# Patient Record
Sex: Female | Born: 1985 | Race: Black or African American | Hispanic: No | Marital: Single | Smoking: Current every day smoker
Health system: Southern US, Community
[De-identification: ages and names within clinical notes are randomized; demographics above are authoritative.]

## PROBLEM LIST (undated history)

## (undated) ENCOUNTER — Inpatient Hospital Stay (HOSPITAL_COMMUNITY): Payer: Self-pay

## (undated) ENCOUNTER — Emergency Department (HOSPITAL_COMMUNITY): Admission: EM | Payer: Medicaid Other | Source: Home / Self Care

## (undated) DIAGNOSIS — S0101XA Laceration without foreign body of scalp, initial encounter: Secondary | ICD-10-CM

## (undated) DIAGNOSIS — G4730 Sleep apnea, unspecified: Secondary | ICD-10-CM

## (undated) DIAGNOSIS — S52209A Unspecified fracture of shaft of unspecified ulna, initial encounter for closed fracture: Secondary | ICD-10-CM

## (undated) DIAGNOSIS — K589 Irritable bowel syndrome without diarrhea: Secondary | ICD-10-CM

## (undated) DIAGNOSIS — F319 Bipolar disorder, unspecified: Secondary | ICD-10-CM

## (undated) DIAGNOSIS — F419 Anxiety disorder, unspecified: Secondary | ICD-10-CM

## (undated) DIAGNOSIS — N739 Female pelvic inflammatory disease, unspecified: Secondary | ICD-10-CM

## (undated) DIAGNOSIS — F32A Depression, unspecified: Secondary | ICD-10-CM

## (undated) DIAGNOSIS — F329 Major depressive disorder, single episode, unspecified: Secondary | ICD-10-CM

## (undated) DIAGNOSIS — Z8614 Personal history of Methicillin resistant Staphylococcus aureus infection: Secondary | ICD-10-CM

## (undated) DIAGNOSIS — N760 Acute vaginitis: Secondary | ICD-10-CM

## (undated) DIAGNOSIS — B9689 Other specified bacterial agents as the cause of diseases classified elsewhere: Secondary | ICD-10-CM

## (undated) HISTORY — PX: TUBAL LIGATION: SHX77

## (undated) HISTORY — PX: CYST EXCISION: SHX5701

## (undated) HISTORY — PX: OVARIAN CYST REMOVAL: SHX89

## (undated) HISTORY — PX: THYROID CYST EXCISION: SHX2511

---
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
## 2003-01-14 ENCOUNTER — Inpatient Hospital Stay (HOSPITAL_COMMUNITY): Admission: AD | Admit: 2003-01-14 | Discharge: 2003-01-14 | Payer: Self-pay | Admitting: Family Medicine

## 2003-01-18 ENCOUNTER — Inpatient Hospital Stay (HOSPITAL_COMMUNITY): Admission: AD | Admit: 2003-01-18 | Discharge: 2003-01-18 | Payer: Self-pay | Admitting: *Deleted

## 2003-02-02 ENCOUNTER — Emergency Department (HOSPITAL_COMMUNITY): Admission: EM | Admit: 2003-02-02 | Discharge: 2003-02-03 | Payer: Self-pay | Admitting: Emergency Medicine

## 2003-04-06 ENCOUNTER — Emergency Department (HOSPITAL_COMMUNITY): Admission: EM | Admit: 2003-04-06 | Discharge: 2003-04-07 | Payer: Self-pay | Admitting: Emergency Medicine

## 2003-06-24 ENCOUNTER — Emergency Department (HOSPITAL_COMMUNITY): Admission: EM | Admit: 2003-06-24 | Discharge: 2003-06-24 | Payer: Self-pay | Admitting: Emergency Medicine

## 2003-10-04 ENCOUNTER — Emergency Department (HOSPITAL_COMMUNITY): Admission: EM | Admit: 2003-10-04 | Discharge: 2003-10-04 | Payer: Self-pay | Admitting: Emergency Medicine

## 2003-10-07 ENCOUNTER — Emergency Department (HOSPITAL_COMMUNITY): Admission: AD | Admit: 2003-10-07 | Discharge: 2003-10-07 | Payer: Self-pay | Admitting: Family Medicine

## 2003-10-18 ENCOUNTER — Emergency Department (HOSPITAL_COMMUNITY): Admission: EM | Admit: 2003-10-18 | Discharge: 2003-10-18 | Payer: Self-pay | Admitting: Emergency Medicine

## 2004-04-17 ENCOUNTER — Emergency Department (HOSPITAL_COMMUNITY): Admission: EM | Admit: 2004-04-17 | Discharge: 2004-04-17 | Payer: Self-pay | Admitting: *Deleted

## 2004-04-18 ENCOUNTER — Emergency Department (HOSPITAL_COMMUNITY): Admission: EM | Admit: 2004-04-18 | Discharge: 2004-04-18 | Payer: Self-pay | Admitting: Family Medicine

## 2004-05-08 ENCOUNTER — Emergency Department (HOSPITAL_COMMUNITY): Admission: EM | Admit: 2004-05-08 | Discharge: 2004-05-08 | Payer: Self-pay | Admitting: *Deleted

## 2004-05-10 ENCOUNTER — Emergency Department (HOSPITAL_COMMUNITY): Admission: EM | Admit: 2004-05-10 | Discharge: 2004-05-10 | Payer: Self-pay | Admitting: Family Medicine

## 2004-08-06 ENCOUNTER — Emergency Department (HOSPITAL_COMMUNITY): Admission: EM | Admit: 2004-08-06 | Discharge: 2004-08-06 | Payer: Self-pay | Admitting: Emergency Medicine

## 2004-08-07 ENCOUNTER — Inpatient Hospital Stay (HOSPITAL_COMMUNITY): Admission: AD | Admit: 2004-08-07 | Discharge: 2004-08-07 | Payer: Self-pay | Admitting: Family Medicine

## 2004-09-25 ENCOUNTER — Inpatient Hospital Stay (HOSPITAL_COMMUNITY): Admission: AD | Admit: 2004-09-25 | Discharge: 2004-09-25 | Payer: Self-pay | Admitting: *Deleted

## 2004-09-26 ENCOUNTER — Ambulatory Visit (HOSPITAL_COMMUNITY): Admission: RE | Admit: 2004-09-26 | Discharge: 2004-09-26 | Payer: Self-pay | Admitting: *Deleted

## 2004-10-27 ENCOUNTER — Ambulatory Visit (HOSPITAL_COMMUNITY): Admission: RE | Admit: 2004-10-27 | Discharge: 2004-10-27 | Payer: Self-pay | Admitting: *Deleted

## 2004-11-07 ENCOUNTER — Emergency Department (HOSPITAL_COMMUNITY): Admission: EM | Admit: 2004-11-07 | Discharge: 2004-11-07 | Payer: Self-pay | Admitting: Emergency Medicine

## 2004-11-27 ENCOUNTER — Inpatient Hospital Stay (HOSPITAL_COMMUNITY): Admission: AD | Admit: 2004-11-27 | Discharge: 2004-11-27 | Payer: Self-pay | Admitting: *Deleted

## 2004-11-27 ENCOUNTER — Ambulatory Visit: Payer: Self-pay | Admitting: Family Medicine

## 2005-01-03 ENCOUNTER — Inpatient Hospital Stay (HOSPITAL_COMMUNITY): Admission: AD | Admit: 2005-01-03 | Discharge: 2005-01-03 | Payer: Self-pay | Admitting: Obstetrics & Gynecology

## 2005-02-04 ENCOUNTER — Inpatient Hospital Stay (HOSPITAL_COMMUNITY): Admission: AD | Admit: 2005-02-04 | Discharge: 2005-02-04 | Payer: Self-pay | Admitting: Obstetrics & Gynecology

## 2005-03-27 ENCOUNTER — Inpatient Hospital Stay (HOSPITAL_COMMUNITY): Admission: AD | Admit: 2005-03-27 | Discharge: 2005-03-29 | Payer: Self-pay | Admitting: Obstetrics

## 2005-07-27 ENCOUNTER — Ambulatory Visit (HOSPITAL_COMMUNITY): Admission: RE | Admit: 2005-07-27 | Discharge: 2005-07-27 | Payer: Self-pay | Admitting: Obstetrics

## 2005-11-10 ENCOUNTER — Inpatient Hospital Stay (HOSPITAL_COMMUNITY): Admission: AD | Admit: 2005-11-10 | Discharge: 2005-11-10 | Payer: Self-pay | Admitting: Obstetrics

## 2005-11-24 ENCOUNTER — Emergency Department (HOSPITAL_COMMUNITY): Admission: EM | Admit: 2005-11-24 | Discharge: 2005-11-24 | Payer: Self-pay | Admitting: *Deleted

## 2006-01-24 ENCOUNTER — Inpatient Hospital Stay (HOSPITAL_COMMUNITY): Admission: AD | Admit: 2006-01-24 | Discharge: 2006-01-24 | Payer: Self-pay | Admitting: Obstetrics & Gynecology

## 2006-06-27 ENCOUNTER — Inpatient Hospital Stay (HOSPITAL_COMMUNITY): Admission: AD | Admit: 2006-06-27 | Discharge: 2006-06-27 | Payer: Self-pay | Admitting: Obstetrics

## 2006-07-12 ENCOUNTER — Inpatient Hospital Stay (HOSPITAL_COMMUNITY): Admission: AD | Admit: 2006-07-12 | Discharge: 2006-07-12 | Payer: Self-pay | Admitting: Obstetrics

## 2006-08-04 ENCOUNTER — Inpatient Hospital Stay (HOSPITAL_COMMUNITY): Admission: AD | Admit: 2006-08-04 | Discharge: 2006-08-04 | Payer: Self-pay | Admitting: Obstetrics

## 2006-10-01 ENCOUNTER — Inpatient Hospital Stay (HOSPITAL_COMMUNITY): Admission: AD | Admit: 2006-10-01 | Discharge: 2006-10-01 | Payer: Self-pay | Admitting: Obstetrics

## 2006-10-22 ENCOUNTER — Inpatient Hospital Stay (HOSPITAL_COMMUNITY): Admission: AD | Admit: 2006-10-22 | Discharge: 2006-10-22 | Payer: Self-pay | Admitting: Obstetrics

## 2007-01-08 ENCOUNTER — Inpatient Hospital Stay (HOSPITAL_COMMUNITY): Admission: AD | Admit: 2007-01-08 | Discharge: 2007-01-08 | Payer: Self-pay | Admitting: Obstetrics & Gynecology

## 2007-04-18 ENCOUNTER — Emergency Department (HOSPITAL_COMMUNITY): Admission: EM | Admit: 2007-04-18 | Discharge: 2007-04-18 | Payer: Self-pay | Admitting: Emergency Medicine

## 2007-04-29 ENCOUNTER — Emergency Department (HOSPITAL_COMMUNITY): Admission: EM | Admit: 2007-04-29 | Discharge: 2007-04-29 | Payer: Self-pay | Admitting: Emergency Medicine

## 2007-05-18 ENCOUNTER — Inpatient Hospital Stay (HOSPITAL_COMMUNITY): Admission: AD | Admit: 2007-05-18 | Discharge: 2007-05-18 | Payer: Self-pay | Admitting: Obstetrics & Gynecology

## 2008-11-13 DIAGNOSIS — Z8614 Personal history of Methicillin resistant Staphylococcus aureus infection: Secondary | ICD-10-CM

## 2008-11-13 HISTORY — DX: Personal history of Methicillin resistant Staphylococcus aureus infection: Z86.14

## 2010-12-04 ENCOUNTER — Encounter: Payer: Self-pay | Admitting: Obstetrics

## 2011-08-29 LAB — POCT PREGNANCY, URINE
Operator id: 263291
Preg Test, Ur: NEGATIVE

## 2011-08-29 LAB — WET PREP, GENITAL
Clue Cells Wet Prep HPF POC: NONE SEEN
Trich, Wet Prep: NONE SEEN
Yeast Wet Prep HPF POC: NONE SEEN

## 2011-08-29 LAB — URINALYSIS, ROUTINE W REFLEX MICROSCOPIC
Bilirubin Urine: NEGATIVE
Glucose, UA: NEGATIVE
Hgb urine dipstick: NEGATIVE
Ketones, ur: NEGATIVE
Nitrite: NEGATIVE
Protein, ur: NEGATIVE
Specific Gravity, Urine: >1.03 — ABNORMAL HIGH
Urobilinogen, UA: 1
pH: 5.5

## 2011-08-29 LAB — GC/CHLAMYDIA PROBE AMP, GENITAL
Chlamydia, DNA Probe: NEGATIVE
GC Probe Amp, Genital: NEGATIVE

## 2011-08-31 LAB — CULTURE, ROUTINE-ABSCESS

## 2012-01-30 ENCOUNTER — Emergency Department (HOSPITAL_COMMUNITY)
Admission: EM | Admit: 2012-01-30 | Discharge: 2012-01-31 | Disposition: A | Discharge status: Home / Self Care | Payer: 59 | Attending: Emergency Medicine | Admitting: Emergency Medicine

## 2012-01-30 ENCOUNTER — Encounter (HOSPITAL_COMMUNITY): Payer: Self-pay | Admitting: *Deleted

## 2012-01-30 DIAGNOSIS — R1032 Left lower quadrant pain: Secondary | ICD-10-CM | POA: Insufficient documentation

## 2012-01-30 DIAGNOSIS — R319 Hematuria, unspecified: Secondary | ICD-10-CM | POA: Insufficient documentation

## 2012-01-30 DIAGNOSIS — R10812 Left upper quadrant abdominal tenderness: Secondary | ICD-10-CM | POA: Insufficient documentation

## 2012-01-30 DIAGNOSIS — F411 Generalized anxiety disorder: Secondary | ICD-10-CM | POA: Insufficient documentation

## 2012-01-30 DIAGNOSIS — G4733 Obstructive sleep apnea (adult) (pediatric): Secondary | ICD-10-CM | POA: Insufficient documentation

## 2012-01-30 DIAGNOSIS — N3091 Cystitis, unspecified with hematuria: Secondary | ICD-10-CM

## 2012-01-30 DIAGNOSIS — F17200 Nicotine dependence, unspecified, uncomplicated: Secondary | ICD-10-CM | POA: Insufficient documentation

## 2012-01-30 DIAGNOSIS — I10 Essential (primary) hypertension: Secondary | ICD-10-CM | POA: Insufficient documentation

## 2012-01-30 DIAGNOSIS — F319 Bipolar disorder, unspecified: Secondary | ICD-10-CM | POA: Insufficient documentation

## 2012-01-30 HISTORY — DX: Bipolar disorder, unspecified: F31.9

## 2012-01-30 HISTORY — DX: Anxiety disorder, unspecified: F41.9

## 2012-01-30 HISTORY — DX: Sleep apnea, unspecified: G47.30

## 2012-01-30 LAB — URINE MICROSCOPIC-ADD ON

## 2012-01-30 LAB — CBC
HCT: 36.7 % (ref 36.0–46.0)
Hemoglobin: 12.8 g/dL (ref 12.0–15.0)
MCH: 26.2 pg (ref 26.0–34.0)
MCHC: 34.9 g/dL (ref 30.0–36.0)
MCV: 75.1 fL — ABNORMAL LOW (ref 78.0–100.0)
Platelets: 261 10*3/uL (ref 150–400)
RBC: 4.89 MIL/uL (ref 3.87–5.11)
RDW: 13.9 % (ref 11.5–15.5)
WBC: 12 10*3/uL — ABNORMAL HIGH (ref 4.0–10.5)

## 2012-01-30 LAB — POCT I-STAT, CHEM 8
BUN: 10 mg/dL (ref 6–23)
Calcium, Ion: 1.16 mmol/L (ref 1.12–1.32)
Chloride: 108 mEq/L (ref 96–112)
Creatinine, Ser: 0.5 mg/dL (ref 0.50–1.10)
Glucose, Bld: 129 mg/dL — ABNORMAL HIGH (ref 70–99)
HCT: 39 % (ref 36.0–46.0)
Hemoglobin: 13.3 g/dL (ref 12.0–15.0)
Potassium: 3.3 mEq/L — ABNORMAL LOW (ref 3.5–5.1)
Sodium: 144 mEq/L (ref 135–145)
TCO2: 23 mmol/L (ref 0–100)

## 2012-01-30 LAB — URINALYSIS, ROUTINE W REFLEX MICROSCOPIC
Glucose, UA: NEGATIVE mg/dL
Ketones, ur: 15 mg/dL — AB
Nitrite: NEGATIVE
Protein, ur: 30 mg/dL — AB
Specific Gravity, Urine: 1.036 — ABNORMAL HIGH (ref 1.005–1.030)
Urobilinogen, UA: 1 mg/dL (ref 0.0–1.0)
pH: 6 (ref 5.0–8.0)

## 2012-01-30 LAB — POCT PREGNANCY, URINE: Preg Test, Ur: NEGATIVE

## 2012-01-30 MED ORDER — ONDANSETRON HCL 4 MG/2ML IJ SOLN
4.0000 mg | Freq: Once | INTRAMUSCULAR | Status: AC
  Administered 2012-01-30: 4 mg via INTRAVENOUS
  Filled 2012-01-30: qty 2

## 2012-01-30 MED ORDER — HYDROMORPHONE HCL PF 1 MG/ML IJ SOLN
0.5000 mg | Freq: Once | INTRAMUSCULAR | Status: AC
  Administered 2012-01-30: 1 mg via INTRAVENOUS
  Filled 2012-01-30: qty 1

## 2012-01-30 MED ORDER — SODIUM CHLORIDE 0.9 % IV SOLN
Freq: Once | INTRAVENOUS | Status: AC
  Administered 2012-01-30: 23:00:00 via INTRAVENOUS

## 2012-01-30 NOTE — ED Notes (Signed)
Patient with bloody urine today.  Patient states that she is not due for her period at this time and has bleeding, heavy at times.

## 2012-01-30 NOTE — ED Notes (Signed)
The pt has had some bloody urine today  Lt sided abd pain .  No painful urination.. lmp one month

## 2012-01-30 NOTE — ED Provider Notes (Signed)
History     CSN: [id number]  Arrival date & time 01/30/12  1845   First MD Initiated Contact with Patient 01/30/12 2212      Chief Complaint  Patient presents with  . bloody urine     (Consider location/radiation/quality/duration/timing/severity/associated sxs/prior treatment) HPI Comments: Patient started menstrual cycle, and she is also having left lower quadrant pain radiating to her left flank.  She has a history of ovarian cyst that needed to be surgically removed.  She does not have a history of kidney stones  The history is provided by the patient.    Past Medical History  Diagnosis Date  . Hypertension   . Anxiety   . Irritable bowel   . Bipolar 1 disorder   . Apnea, sleep     History reviewed. No pertinent past surgical history.  History reviewed. No pertinent family history.  History  Substance Use Topics  . Smoking status: Current Everyday Smoker  . Smokeless tobacco: Not on file  . Alcohol Use: No    OB History    Grav Para Term Preterm Abortions TAB SAB Ect Mult Living                  Review of Systems  Constitutional: Negative for fever and chills.  Gastrointestinal: Positive for abdominal pain.  Genitourinary: Positive for frequency, hematuria, flank pain and vaginal bleeding. Negative for dysuria.  Musculoskeletal: Negative for back pain.  Neurological: Negative for dizziness.    Allergies  Bactrim and Naproxen  Home Medications   Current Outpatient Rx  Name Route Sig Dispense Refill  . AMPHETAMINE-DEXTROAMPHET ER 15 MG PO CP24 Oral Take 15 mg by mouth every morning.    [name] ESZOPICLONE 2 MG PO TABS Oral Take 2 mg by mouth at bedtime. Take immediately before bedtime    . LORAZEPAM 0.5 MG PO TABS Oral Take 0.5 mg by mouth every 8 (eight) hours.    [name] METHOCARBAMOL 750 MG PO TABS Oral Take 750 mg by mouth 4 (four) times daily.    [name] PREGABALIN 300 MG PO CAPS Oral Take 600 mg by mouth 2 (two) times daily.    . QUETIAPINE FUMARATE 200 MG PO  TABS Oral Take 200 mg by mouth at bedtime.    . SERTRALINE HCL 100 MG PO TABS Oral Take 200 mg by mouth daily.      BP 137/84  Pulse 84  Temp(Src) 98.3 F (36.8 C) (Oral)  Resp 19  SpO2 95%  LMP 01/09/2012  Physical Exam  Constitutional: She is oriented to person, place, and time. She appears well-nourished.  Neck: Normal range of motion.  Cardiovascular: Normal rate.   Abdominal: She exhibits no distension. There is tenderness in the left upper quadrant.    Neurological: She is alert and oriented to person, place, and time.  Skin: Skin is warm.    ED Course  Procedures (including critical care time)  Labs Reviewed  URINALYSIS, ROUTINE W REFLEX MICROSCOPIC - Abnormal; Notable for the following:    Color, Urine AMBER (*) BIOCHEMICALS MAY BE AFFECTED BY COLOR   APPearance HAZY (*)    Specific Gravity, Urine 1.036 (*)    Hgb urine dipstick LARGE (*)    Bilirubin Urine SMALL (*)    Ketones, ur 15 (*)    Protein, ur 30 (*)    Leukocytes, UA SMALL (*)    All other components within normal limits  URINE MICROSCOPIC-ADD ON - Abnormal; Notable for the following:    Squamous  Epithelial / LPF FEW (*)    All other components within normal limits  POCT PREGNANCY, URINE  URINALYSIS, ROUTINE W REFLEX MICROSCOPIC   No results found.   No diagnosis found.  12:41 AM has been made aware that patient needs additional pain control and that she is itching slightly.  We'll reorder allotted and 12.5 mg Benadryl  MDM  Patient provided fully cannot determine if the bleeding is from vaginally or if she has an a catheterization with a repeat urine Irrigation reveals that the bleed in his coming from the bladder.  Will CT with history of kidney stones, although this is most likely cystitis       Arman Filter, NP 01/31/12 [phone]

## 2012-01-31 ENCOUNTER — Emergency Department (HOSPITAL_COMMUNITY): Payer: 59

## 2012-01-31 ENCOUNTER — Encounter (HOSPITAL_COMMUNITY): Payer: Self-pay | Admitting: Radiology

## 2012-01-31 MED ORDER — CIPROFLOXACIN HCL 500 MG PO TABS
500.0000 mg | ORAL_TABLET | Freq: Once | ORAL | Status: AC
  Administered 2012-01-31: 500 mg via ORAL
  Filled 2012-01-31: qty 1

## 2012-01-31 MED ORDER — HYDROMORPHONE HCL PF 1 MG/ML IJ SOLN
0.5000 mg | Freq: Once | INTRAMUSCULAR | Status: AC
  Administered 2012-01-31: 0.5 mg via INTRAVENOUS
  Filled 2012-01-31: qty 1

## 2012-01-31 MED ORDER — CIPROFLOXACIN HCL 500 MG PO TABS: 500.0000 mg | ORAL_TABLET | Freq: Two times a day (BID) | ORAL | Status: AC

## 2012-01-31 MED ORDER — DIPHENHYDRAMINE HCL 50 MG/ML IJ SOLN
12.5000 mg | Freq: Once | INTRAMUSCULAR | Status: AC
  Administered 2012-01-31: 12.5 mg via INTRAVENOUS
  Filled 2012-01-31: qty 1

## 2012-01-31 MED ORDER — HYDROCODONE-ACETAMINOPHEN 5-325 MG PO TABS
1.0000 | ORAL_TABLET | Freq: Once | ORAL | Status: AC
  Administered 2012-01-31: 1 via ORAL
  Filled 2012-01-31: qty 1

## 2012-01-31 MED ORDER — HYDROCODONE-ACETAMINOPHEN 5-500 MG PO TABS: 1.0000 | ORAL_TABLET | Freq: Four times a day (QID) | ORAL | Status: AC | PRN

## 2012-01-31 NOTE — ED Notes (Signed)
Pt is discharged but is awaiting until 600 due to decrease side effects of drowsiness from recent pain medications given. Plan of care is updated with verbal understanding.

## 2012-01-31 NOTE — Discharge Instructions (Signed)
Urinary Tract Infection A urinary tract infection (UTI) is often caused by a germ (bacteria). A UTI is usually helped with medicine (antibiotics) that kills germs. Take all the medicine until it is gone. Do this even if you are feeling better. You are usually better in 7 to 10 days. HOME CARE   Drink enough water and fluids to keep your pee (urine) clear or pale yellow. Drink:   Cranberry juice.   Water.   Avoid:   Caffeine.   Tea.   Bubbly (carbonated) drinks.   Alcohol.   Only take medicine as told by your doctor.   To prevent further infections:   Pee often.   After pooping (bowel movement), women should wipe from front to back. Use each tissue only once.   Pee before and after having sex (intercourse).  Ask your doctor when your test results will be ready. Make sure you follow up and get your test results.  GET HELP RIGHT AWAY IF:   There is very bad back pain or lower belly (abdominal) pain.   You get the chills.   You have a fever.   Your baby is older than 3 months with a rectal temperature of 102 F (38.9 C) or higher.   Your baby is 35 months old or younger with a rectal temperature of 100.4 F (38 C) or higher.   You feel sick to your stomach (nauseous) or throw up (vomit).   There is continued burning with peeing.   Your problems are not better in 3 days. Return sooner if you are getting worse.  MAKE SURE YOU:   Understand these instructions.   Will watch your condition.   Will get help right away if you are not doing well or get worse.  Document Released: 04/17/2008 Document Revised: 10/19/2011 Document Reviewed: 04/17/2008 [facility] Patient Information 2012 [place], [place]. Your condition, cystitis, which is bleeding of the bladder wall due to infection.  You've been prescribed Cipro as an antibiotic, as well as Vicodin for pain.  Please call [facility] to set an appointment for further evaluation

## 2012-01-31 NOTE — ED Notes (Signed)
Discharged papers/prescriptions given to pt. At 5:30 am with bus pass, left ER in stable condition . Ambulated to bus stop.

## 2012-01-31 NOTE — ED Notes (Signed)
Pt moved to waiting room, ambulatory with steady gait. Brief report given to RN [facility] for continuing monitoring of pt until decrease in side effects for discharge.

## 2012-01-31 NOTE — ED Provider Notes (Signed)
Medical screening examination/treatment/procedure(s) were performed by non-physician practitioner and as supervising physician I was immediately available for consultation/collaboration.   Dayton Bailiff, MD 01/31/12 [phone]

## 2012-02-19 ENCOUNTER — Emergency Department (HOSPITAL_COMMUNITY)
Admission: EM | Admit: 2012-02-19 | Discharge: 2012-02-19 | Disposition: A | Discharge status: Home / Self Care | Payer: 59 | Attending: Emergency Medicine | Admitting: Emergency Medicine

## 2012-02-19 ENCOUNTER — Emergency Department (HOSPITAL_COMMUNITY)
Admission: EM | Admit: 2012-02-19 | Discharge: 2012-02-19 | Disposition: A | Discharge status: Home / Self Care | Payer: Self-pay

## 2012-02-19 ENCOUNTER — Encounter (HOSPITAL_COMMUNITY): Payer: Self-pay | Admitting: *Deleted

## 2012-02-19 DIAGNOSIS — R21 Rash and other nonspecific skin eruption: Secondary | ICD-10-CM | POA: Insufficient documentation

## 2012-02-19 DIAGNOSIS — G4730 Sleep apnea, unspecified: Secondary | ICD-10-CM | POA: Insufficient documentation

## 2012-02-19 DIAGNOSIS — Z882 Allergy status to sulfonamides status: Secondary | ICD-10-CM | POA: Insufficient documentation

## 2012-02-19 DIAGNOSIS — F319 Bipolar disorder, unspecified: Secondary | ICD-10-CM | POA: Insufficient documentation

## 2012-02-19 DIAGNOSIS — Z881 Allergy status to other antibiotic agents status: Secondary | ICD-10-CM | POA: Insufficient documentation

## 2012-02-19 DIAGNOSIS — F17200 Nicotine dependence, unspecified, uncomplicated: Secondary | ICD-10-CM | POA: Insufficient documentation

## 2012-02-19 DIAGNOSIS — I10 Essential (primary) hypertension: Secondary | ICD-10-CM | POA: Insufficient documentation

## 2012-02-19 DIAGNOSIS — W57XXXA Bitten or stung by nonvenomous insect and other nonvenomous arthropods, initial encounter: Secondary | ICD-10-CM

## 2012-02-19 DIAGNOSIS — K589 Irritable bowel syndrome without diarrhea: Secondary | ICD-10-CM | POA: Insufficient documentation

## 2012-02-19 MED ORDER — HYDROXYZINE HCL 25 MG PO TABS
25.0000 mg | ORAL_TABLET | Freq: Once | ORAL | Status: AC
  Administered 2012-02-19: 25 mg via ORAL
  Filled 2012-02-19: qty 1

## 2012-02-19 MED ORDER — HYDROXYZINE HCL 25 MG PO TABS: 25.0000 mg | ORAL_TABLET | Freq: Four times a day (QID) | ORAL | Status: AC

## 2012-02-19 NOTE — ED Provider Notes (Signed)
History     CSN: [id number]  Arrival date & time 02/19/12  2033   First MD Initiated Contact with Patient 02/19/12 2108      Chief Complaint  Patient presents with  . Urticaria     HPI  History provided by the patient. Patient is a 26 year old female with history of hypertension, bipolar disorder and irritable bowels presents with complaints of rash of the skin. Rash began early this morning and has been very pruritic. Patient reports that she stayed at a friend's apartment last night and slept on the floor. Patient has small spots and swelling to the arms, face and upper body. Patient has tried using Benadryl without significant improvement. Patient denies any swelling of the throat, tongue or lips. She denies any difficulty breathing or shortness of breath. Patient denies having similar symptoms previously. Patient denies any other aggravating or alleviating factors.    Past Medical History  Diagnosis Date  . Hypertension   . Anxiety   . Irritable bowel   . Bipolar 1 disorder   . Apnea, sleep     History reviewed. No pertinent past surgical history.  No family history on file.  History  Substance Use Topics  . Smoking status: Current Everyday Smoker  . Smokeless tobacco: Not on file  . Alcohol Use: No    OB History    Grav Para Term Preterm Abortions TAB SAB Ect Mult Living                  Review of Systems  Constitutional: Negative for fever and chills.  HENT: Negative for neck pain and neck stiffness.   Respiratory: Negative for cough and shortness of breath.   Cardiovascular: Negative for chest pain and palpitations.  Gastrointestinal: Negative for nausea, vomiting and abdominal pain.  Skin: Positive for rash.    Allergies  Bactrim; Naproxen; and Sulfa antibiotics  Home Medications   Current Outpatient Rx  Name Route Sig Dispense Refill  . SERTRALINE HCL 100 MG PO TABS Oral Take 200 mg by mouth daily.      BP 92/68  Pulse 93  Temp(Src) 98.4 F  (36.9 C) (Oral)  Resp 19  SpO2 99%  LMP 01/09/2012  Physical Exam  Nursing note and vitals reviewed. Constitutional: She is oriented to person, place, and time. She appears well-developed and well-nourished. No distress.  HENT:  Head: Normocephalic and atraumatic.  Mouth/Throat: Oropharynx is clear and moist.  Neck: Normal range of motion. Neck supple.  Cardiovascular: Normal rate and regular rhythm.   Pulmonary/Chest: Effort normal and breath sounds normal. No stridor. No respiratory distress. She has no wheezes. She has no rales.  Abdominal: Soft.  Neurological: She is alert and oriented to person, place, and time.  Skin: Skin is warm and dry. Rash noted.       Maculopapular rash on bilateral forearms, right face and posterior neck. Lesions are in groups and clusters. Rash with heaviest concentration on left posterior forearm. Some secondary excoriations.  Psychiatric: She has a normal mood and affect. Her behavior is normal.    ED Course  Procedures       1. Rash   2. Insect bite       MDM  9:10 PM patient seen and evaluated. Patient no acute distress.        Angus Seller, [place] 02/20/12 1916

## 2012-02-19 NOTE — ED Notes (Signed)
Yesterday she slept on someone floor and when she woke up she had  Itching all over her body and she has swollen red areas all over her body,

## 2012-02-19 NOTE — Discharge Instructions (Signed)
Bedbugs Bedbugs are tiny bugs that live in and around beds. During the day, they hide in mattresses and other places near beds. They come out at night and bite people lying in bed. They need blood to live and grow. Bedbugs can be found in beds anywhere. Usually, they are found in places where many people come and go (hotels, shelters, hospitals). It does not matter whether the place is dirty or clean. Getting bitten by bedbugs rarely causes a medical problem. The biggest problem can be getting rid of them. This often takes the work of a pest control expert. CAUSES  Less use of pesticides. Bedbugs were common before the 1950s. Then, strong pesticides such as DDT nearly wiped them out. Today, these pesticides are not used because they harm the environment and can cause health problems.   More travel. Besides mattresses, bedbugs can also live in clothing and luggage. They can come along as people travel from place to place. Bedbugs are more common in certain parts of the world. When people travel to those areas, the bugs can come home with them.   Presence of birds and bats. Bedbugs often infest birds and bats. If you have these animals in or near your home, bedbugs may infest your house, too.  SYMPTOMS It does not hurt to be bitten by a bedbug. You will probably not wake up when you are bitten. Bedbugs usually bite areas of the skin that are not covered. Symptoms may show when you wake up, or they may take a day or more to show up. Symptoms may include:  Small red bumps on the skin. These might be lined up in a row or clustered in a group.   A darker red dot in the middle of red bumps.   Blisters on the skin. There may be swelling and very bad itching. These may be signs of an allergic reaction. This does not happen often.  DIAGNOSIS Bedbug bites might look and feel like other types of insect bites. The bugs do not stay on the body like ticks or lice. They bite, drop off, and crawl away to hide.  Your caregiver will probably:  Ask about your symptoms.   Ask about your recent activities and travel.   Check your skin for bedbug bites.   Ask you to check at home for signs of bedbugs. You should look for:   Spots or stains on the bed or nearby. This could be from bedbugs that were crushed or from their eggs or waste.   Bedbugs themselves. They are reddish-brown, oval, and flat. They do not fly. They are about the size of an apple seed.   Places to look for bedbugs include:   Beds. Check mattresses, headboards, box springs, and bed frames.   On drapes and curtains near the bed.   Under carpeting in the bedroom.   Behind electrical outlets.   Behind any wallpaper that is peeling.   Inside luggage.  TREATMENT Most bedbug bites do not need treatment. They usually go away on their own in a few days. The bites are not dangerous. However, treatment may be needed if you have scratched so much that your skin has become infected. You may also need treatment if you are allergic to bedbug bites. Treatment options include:  A drug that stops swelling and itching (corticosteroid). Usually, a cream is rubbed on the skin. If you have a bad rash, you may be given a corticosteroid pill.   Oral antihistamines. These are   pills to help control itching.   Antibiotic medicines. An antibiotic may be prescribed for infected skin.  HOME CARE INSTRUCTIONS   Take any medicine prescribed by your caregiver for your bites. Follow the directions carefully.   Consider wearing pajamas with long sleeves and pant legs.   Your bedroom may need to be treated. A pest control expert should make sure the bedbugs are gone. You may need to throw away mattresses or luggage. Ask the pest control expert what you can do to keep the bedbugs from coming back. Common suggestions include:   Putting a plastic cover over your mattress.   Washing and drying your clothes and bedding in hot water and a hot dryer. The  temperature should be hotter than 120 F (48.9 C). Bedbugs are killed by high temperatures.   Vacuuming carefully all around your bed. Vacuum in all cracks and crevices where the bugs might hide. Do this often.   Carefully checking all used furniture, bedding, or clothes that you bring into your house.   Eliminating bird nests and bat roosts.   If you get bedbug bites when traveling, check all your possessions carefully before bringing them into your house. If you find any bugs on clothes or in your luggage, consider throwing those items away.  SEEK MEDICAL CARE IF:  You have red bug bites that keep coming back.   You have red bug bites that itch badly.   You have bug bites that cause a skin rash.   You have scratch marks that are red and sore.  SEEK IMMEDIATE MEDICAL CARE IF: You have a fever. Document Released: 12/02/2010 Document Revised: 10/19/2011 Document Reviewed: 12/02/2010 [employer] Patient Information 2012 [employer].   RESOURCE GUIDE  Dental Problems  Patients with Medicaid: [facility]                     [facility] [address]                                           [address] Phone:  [phone]                                                  Phone:  [phone]  If unable to pay or uninsured, contact:  [facility] or [facility] to become qualified for the adult dental clinic.  Chronic Pain Problems Contact [facility]  [phone] Patients need to be referred by their primary care doctor.  Insufficient Money for Medicine Contact [employer]:  call "211" or [facility] 271-5999.  No Primary Care Doctor Call [facility]  832-8000 Other agencies that provide inexpensive medical care    [facility]  [phone]     Internal Medicine  [phone]    [facility]  [phone]    Women's Clinic  [phone]    [facility]  [phone]    [facility]  [phone]  Psychological Services [facility]  [phone] [facility]  [phone] [facility]   800 853-5163 (emergency services 641-4993)  Substance Abuse Resources [facility]  336-882-2125 [facility] 336-784-9470 [facility]   [facility] 336-285-9073 [facility] 336-845-3988 Residential & Outpatient Substance Abuse Program  800-659-3381  Abuse/Neglect [place] Child Abuse Hotline (336) 641-3795 [place] [facility] Abuse Hotline 800-378-5315 (After Hours)  Emergency Shelter [facility] (336) 271-5985  [facility] (336) 275-9566 [facility] (704) 372-4663  MRSA Hotline #:   832-7006    [place] Resources  [facility] [place]     [employer]                          [facility] [address] [place]                       [address]      [address]  [place]                                                [place]                            [place] Phone:  [phone]                                   Phone:  [phone]                 Phone:  [phone]  [facility] Phone:  [phone]  [place] Child Abuse Hotline (336) 342-1394 (336) 342-3537 (After Hours)   

## 2012-02-20 NOTE — ED Provider Notes (Signed)
Medical screening examination/treatment/procedure(s) were performed by non-physician practitioner and as supervising physician I was immediately available for consultation/collaboration.  Epsie Walthall R. Rosell Khouri, MD 02/20/12 2257 

## 2012-03-06 ENCOUNTER — Encounter (HOSPITAL_COMMUNITY): Payer: Self-pay | Admitting: *Deleted

## 2012-03-06 ENCOUNTER — Emergency Department (HOSPITAL_COMMUNITY)
Admission: EM | Admit: 2012-03-06 | Discharge: 2012-03-06 | Disposition: A | Discharge status: Home / Self Care | Payer: 59 | Attending: Emergency Medicine | Admitting: Emergency Medicine

## 2012-03-06 DIAGNOSIS — I10 Essential (primary) hypertension: Secondary | ICD-10-CM | POA: Insufficient documentation

## 2012-03-06 DIAGNOSIS — K589 Irritable bowel syndrome without diarrhea: Secondary | ICD-10-CM | POA: Insufficient documentation

## 2012-03-06 DIAGNOSIS — F17200 Nicotine dependence, unspecified, uncomplicated: Secondary | ICD-10-CM | POA: Insufficient documentation

## 2012-03-06 DIAGNOSIS — R079 Chest pain, unspecified: Secondary | ICD-10-CM | POA: Insufficient documentation

## 2012-03-06 DIAGNOSIS — R0602 Shortness of breath: Secondary | ICD-10-CM | POA: Insufficient documentation

## 2012-03-06 DIAGNOSIS — I498 Other specified cardiac arrhythmias: Secondary | ICD-10-CM | POA: Insufficient documentation

## 2012-03-06 DIAGNOSIS — F411 Generalized anxiety disorder: Secondary | ICD-10-CM | POA: Insufficient documentation

## 2012-03-06 DIAGNOSIS — F419 Anxiety disorder, unspecified: Secondary | ICD-10-CM

## 2012-03-06 DIAGNOSIS — F319 Bipolar disorder, unspecified: Secondary | ICD-10-CM | POA: Insufficient documentation

## 2012-03-06 MED ORDER — LORAZEPAM 1 MG PO TABS
1.0000 mg | ORAL_TABLET | Freq: Once | ORAL | Status: AC
  Administered 2012-03-06: 1 mg via ORAL
  Filled 2012-03-06: qty 1

## 2012-03-06 MED ORDER — ACETAMINOPHEN 325 MG PO TABS
650.0000 mg | ORAL_TABLET | Freq: Once | ORAL | Status: AC
  Administered 2012-03-06: 650 mg via ORAL
  Filled 2012-03-06: qty 2

## 2012-03-06 NOTE — ED Provider Notes (Signed)
History     CSN: [id number]  Arrival date & time 03/06/12  1703   First MD Initiated Contact with Patient 03/06/12 1754      Chief Complaint  Patient presents with  . Anxiety    per EMS pt hyperventilating, has hx of anxiety, EMS reports non-compliance with medication. pt non-verbal in triage. VSS    (Consider location/radiation/quality/duration/timing/severity/associated sxs/prior treatment) HPI Comments: Patient presents today with a panic attack. She states, that she's had panic attacks, multiple times in the past and is currently not taking her medication to control the panic attacks. She, says she's been under a lot of stress recently and started having a panic attack today which is why she came over. She had shortness of breath. Some pain across her chest and tingling around her mouth and her extremities. She states either same symptoms. She is however past, panic attacks. She started to feel a bit better now.  Patient is a 26 y.o. female presenting with anxiety. The history is provided by the patient.  Anxiety This is a recurrent problem. Associated symptoms include chest pain and shortness of breath. Pertinent negatives include no abdominal pain and no headaches.    Past Medical History  Diagnosis Date  . Hypertension   . Anxiety   . Irritable bowel   . Bipolar 1 disorder   . Apnea, sleep     History reviewed. No pertinent past surgical history.  History reviewed. No pertinent family history.  History  Substance Use Topics  . Smoking status: Current Everyday Smoker    Types: Cigarettes  . Smokeless tobacco: Not on file  . Alcohol Use: No    OB History    Grav Para Term Preterm Abortions TAB SAB Ect Mult Living                  Review of Systems  Constitutional: Negative for fever, chills, diaphoresis and fatigue.  HENT: Negative for congestion, rhinorrhea and sneezing.   Eyes: Negative.   Respiratory: Positive for shortness of breath. Negative for cough  and chest tightness.   Cardiovascular: Positive for chest pain. Negative for leg swelling.  Gastrointestinal: Negative for nausea, vomiting, abdominal pain, diarrhea and blood in stool.  Genitourinary: Negative for frequency, hematuria, flank pain and difficulty urinating.  Musculoskeletal: Negative for back pain and arthralgias.  Skin: Negative for rash.  Neurological: Negative for dizziness, speech difficulty, weakness, numbness and headaches.    Allergies  Bactrim; Naproxen; and Sulfa antibiotics  Home Medications   Current Outpatient Rx  Name Route Sig Dispense Refill  . ALBUTEROL SULFATE HFA 108 (90 BASE) MCG/ACT IN AERS Inhalation Inhale 2 puffs into the lungs every 6 (six) hours as needed. For shortness of breath/bronchitis    . IBUPROFEN 400 MG PO TABS Oral Take 400 mg by mouth every 6 (six) hours as needed. For pain    . SERTRALINE HCL 100 MG PO TABS Oral Take 200 mg by mouth daily.      BP 108/52  Pulse 93  Temp 98 F (36.7 C)  Resp 24  SpO2 100%  LMP 01/28/2012  Physical Exam  Constitutional: She is oriented to person, place, and time. She appears well-developed and well-nourished.  HENT:  Head: Normocephalic and atraumatic.  Eyes: Pupils are equal, round, and reactive to light.  Neck: Normal range of motion. Neck supple.  Cardiovascular: Normal rate, regular rhythm and normal heart sounds.   Pulmonary/Chest: Effort normal and breath sounds normal. No respiratory distress. She has no  wheezes. She has no rales. She exhibits no tenderness.  Abdominal: Soft. Bowel sounds are normal. There is no tenderness. There is no rebound and no guarding.  Musculoskeletal: Normal range of motion. She exhibits no edema.  Lymphadenopathy:    She has no cervical adenopathy.  Neurological: She is alert and oriented to person, place, and time.  Skin: Skin is warm and dry. No rash noted.  Psychiatric: She has a normal mood and affect.    ED Course  Procedures (including critical  care time)  Labs Reviewed - No data to display No results found.  Date: 03/06/2012  Rate: 108  Rhythm: sinus tachycardia  QRS Axis: normal  Intervals: normal  ST/T Wave abnormalities: normal  Conduction Disutrbances:none  Narrative Interpretation:   Old EKG Reviewed: none available    1. Anxiety       MDM  Pt given one dose of ativan.  Feeling much better.  No CP or SOB.  Symptoms consistent with past panic attacks.  Doubt ACS.  Pt will f/u with her PMD to get restarted on her anxiety meds        Rolan Bucco, MD 03/06/12 4540

## 2012-03-06 NOTE — Discharge Instructions (Signed)

## 2012-03-06 NOTE — ED Notes (Signed)
[id number]> Expected date:03/06/12<BR> Expected time: 4:53 PM<BR> Means of arrival:Ambulance<BR> Comments:<BR> M40. 26 yo f. Hyperventilating. Increased RR 30+, tachy 110, anxiety. 10 mins

## 2012-03-15 ENCOUNTER — Emergency Department (HOSPITAL_COMMUNITY)
Admission: EM | Admit: 2012-03-15 | Discharge: 2012-03-15 | Disposition: A | Discharge status: Home / Self Care | Payer: 59 | Attending: Emergency Medicine | Admitting: Emergency Medicine

## 2012-03-15 ENCOUNTER — Encounter (HOSPITAL_COMMUNITY): Payer: Self-pay | Admitting: Emergency Medicine

## 2012-03-15 DIAGNOSIS — I10 Essential (primary) hypertension: Secondary | ICD-10-CM | POA: Insufficient documentation

## 2012-03-15 DIAGNOSIS — N898 Other specified noninflammatory disorders of vagina: Secondary | ICD-10-CM

## 2012-03-15 DIAGNOSIS — R10819 Abdominal tenderness, unspecified site: Secondary | ICD-10-CM | POA: Insufficient documentation

## 2012-03-15 DIAGNOSIS — F319 Bipolar disorder, unspecified: Secondary | ICD-10-CM | POA: Insufficient documentation

## 2012-03-15 DIAGNOSIS — F411 Generalized anxiety disorder: Secondary | ICD-10-CM | POA: Insufficient documentation

## 2012-03-15 DIAGNOSIS — R109 Unspecified abdominal pain: Secondary | ICD-10-CM | POA: Insufficient documentation

## 2012-03-15 DIAGNOSIS — M549 Dorsalgia, unspecified: Secondary | ICD-10-CM | POA: Insufficient documentation

## 2012-03-15 DIAGNOSIS — N390 Urinary tract infection, site not specified: Secondary | ICD-10-CM

## 2012-03-15 DIAGNOSIS — R110 Nausea: Secondary | ICD-10-CM | POA: Insufficient documentation

## 2012-03-15 LAB — URINALYSIS, ROUTINE W REFLEX MICROSCOPIC
Bilirubin Urine: NEGATIVE
Glucose, UA: NEGATIVE mg/dL
Ketones, ur: NEGATIVE mg/dL
Nitrite: NEGATIVE
Protein, ur: NEGATIVE mg/dL
Specific Gravity, Urine: 1.024 (ref 1.005–1.030)
Urobilinogen, UA: 1 mg/dL (ref 0.0–1.0)
pH: 6 (ref 5.0–8.0)

## 2012-03-15 LAB — URINE MICROSCOPIC-ADD ON

## 2012-03-15 LAB — WET PREP, GENITAL
Trich, Wet Prep: NONE SEEN
Yeast Wet Prep HPF POC: NONE SEEN

## 2012-03-15 LAB — POCT PREGNANCY, URINE: Preg Test, Ur: NEGATIVE

## 2012-03-15 MED ORDER — HYDROCODONE-ACETAMINOPHEN 5-325 MG PO TABS
1.0000 | ORAL_TABLET | Freq: Once | ORAL | Status: AC
  Administered 2012-03-15: 1 via ORAL
  Filled 2012-03-15: qty 1

## 2012-03-15 MED ORDER — AZITHROMYCIN 250 MG PO TABS
1000.0000 mg | ORAL_TABLET | Freq: Once | ORAL | Status: AC
  Administered 2012-03-15: 1000 mg via ORAL
  Filled 2012-03-15: qty 4

## 2012-03-15 MED ORDER — NITROFURANTOIN MONOHYD MACRO 100 MG PO CAPS: 100.0000 mg | ORAL_CAPSULE | Freq: Two times a day (BID) | ORAL | Status: AC

## 2012-03-15 MED ORDER — CEFTRIAXONE SODIUM 250 MG IJ SOLR
250.0000 mg | Freq: Once | INTRAMUSCULAR | Status: AC
  Administered 2012-03-15: 250 mg via INTRAMUSCULAR
  Filled 2012-03-15: qty 250

## 2012-03-15 NOTE — Discharge Instructions (Signed)
FOLLOW UP WITH [facility] IF SYMPTOMS PERSIST OR WORSEN. TAKE MEDICATION AS PRESCRIBED. RETURN HERE AS NEEDED. IBUPROFEN AND/OR TYLENOL FOR PAIN.  Urinary Tract Infection Infections of the urinary tract can start in several places. A bladder infection (cystitis), a kidney infection (pyelonephritis), and a prostate infection (prostatitis) are different types of urinary tract infections (UTIs). They usually get better if treated with medicines (antibiotics) that kill germs. Take all the medicine until it is gone. You or your child may feel better in a few days, but TAKE ALL MEDICINE or the infection may not respond and may become more difficult to treat. HOME CARE INSTRUCTIONS   Drink enough water and fluids to keep the urine clear or pale yellow. Cranberry juice is especially recommended, in addition to large amounts of water.   Avoid caffeine, tea, and carbonated beverages. They tend to irritate the bladder.   Alcohol may irritate the prostate.   Only take over-the-counter or prescription medicines for pain, discomfort, or fever as directed by your caregiver.  To prevent further infections:  Empty the bladder often. Avoid holding urine for long periods of time.   After a bowel movement, women should cleanse from front to back. Use each tissue only once.   Empty the bladder before and after sexual intercourse.  FINDING OUT THE RESULTS OF YOUR TEST Not all test results are available during your visit. If your or your child's test results are not back during the visit, make an appointment with your caregiver to find out the results. Do not assume everything is normal if you have not heard from your caregiver or the medical facility. It is important for you to follow up on all test results. SEEK MEDICAL CARE IF:   There is back pain.   Your baby is older than 3 months with a rectal temperature of 100.5 F (38.1 C) or higher for more than 1 day.   Your or your child's problems  (symptoms) are no better in 3 days. Return sooner if you or your child is getting worse.  SEEK IMMEDIATE MEDICAL CARE IF:   There is severe back pain or lower abdominal pain.   You or your child develops chills.   You have a fever.   Your baby is older than 3 months with a rectal temperature of 102 F (38.9 C) or higher.   Your baby is 59 months old or younger with a rectal temperature of 100.4 F (38 C) or higher.   There is nausea or vomiting.   There is continued burning or discomfort with urination.  MAKE SURE YOU:   Understand these instructions.   Will watch your condition.   Will get help right away if you are not doing well or get worse.  Document Released: 08/09/2005 Document Revised: 10/19/2011 Document Reviewed: 03/14/2007 [facility] Patient Information 2012 [place], [place].

## 2012-03-15 NOTE — ED Notes (Addendum)
Pt c/o lower abdominal cramping x 2 weeks. States she has also had vaginal discharge x 1 week with foul odor. Reports nausea, no vomiting, denies diarrhea. States she took pregnancy test at home and couldn't tell if it was negative or positive. States she has had unprotected sex recently with 2 people. Also reports h/o ovarian cysts

## 2012-03-15 NOTE — ED Notes (Signed)
Pt c/o lower abd pain worse in morning x 2 weeks with nausea and clear vaginal discharge; pt sts LMP was 7 weeks ago

## 2012-03-15 NOTE — ED Notes (Signed)
Pelvic cart set up at bedside  

## 2012-03-15 NOTE — ED Provider Notes (Signed)
History     CSN: [id number]  Arrival date & time 03/15/12  1341   First MD Initiated Contact with Patient 03/15/12 1532      Chief Complaint  Patient presents with  . Abdominal Pain  . Vaginal Discharge    (Consider location/radiation/quality/duration/timing/severity/associated sxs/prior treatment) Patient is a 26 y.o. female presenting with abdominal pain and vaginal discharge. The history is provided by the patient.  Abdominal Pain The primary symptoms of the illness include abdominal pain, nausea and vaginal discharge. The primary symptoms of the illness do not include fever, vomiting or dysuria.  The abdominal pain radiates to the RLQ, LLQ and suprapubic region.  The vaginal discharge was first noticed more than 2 days ago. The amount of discharge is variable. The vaginal discharge is not associated with dysuria.  Additional symptoms associated with the illness include back pain. Associated symptoms comments: Lower abdominal pain for 3 weeks, initially intermittent now constant and associated with nausea. No fever, vomiting, change in bowel habit. No dysuria. She complains of vaginal discharge and is concerned she has been exposed to a STD..  Vaginal Discharge Associated symptoms include abdominal pain and nausea. Pertinent negatives include no fever, rash or vomiting.    Past Medical History  Diagnosis Date  . Hypertension   . Anxiety   . Irritable bowel   . Bipolar 1 disorder   . Apnea, sleep     History reviewed. No pertinent past surgical history.  History reviewed. No pertinent family history.  History  Substance Use Topics  . Smoking status: Current Everyday Smoker    Types: Cigarettes  . Smokeless tobacco: Not on file  . Alcohol Use: No    OB History    Grav Para Term Preterm Abortions TAB SAB Ect Mult Living                  Review of Systems  Constitutional: Negative for fever.  Gastrointestinal: Positive for nausea and abdominal pain. Negative for  vomiting.  Genitourinary: Positive for vaginal discharge. Negative for dysuria.  Musculoskeletal: Positive for back pain.  Skin: Negative for rash.    Allergies  Bactrim; Naproxen; and Sulfa antibiotics  Home Medications  No current outpatient prescriptions on file.  BP 123/61  Pulse 74  Temp(Src) 97.8 F (36.6 C) (Oral)  Resp 18  SpO2 99%  LMP 01/28/2012  Physical Exam  Constitutional: She appears well-developed and well-nourished.  HENT:  Head: Normocephalic.  Neck: Normal range of motion. Neck supple.  Pulmonary/Chest: Effort normal.  Abdominal: Soft. Bowel sounds are normal. She exhibits no mass. There is no rebound and no guarding.       Mild tenderness across lower abdomen.   Genitourinary: Uterus normal. Vaginal discharge found.       Mild left adnexal tenderness, greater CMT. White, smooth cervical discharge. No bleeding. No palpable mass. Right adnexa nontender.  Musculoskeletal: Normal range of motion.  Neurological: She is alert. No cranial nerve deficit.  Skin: Skin is warm and dry. No rash noted.  Psychiatric: She has a normal mood and affect.    ED Course  Procedures (including critical care time)  Labs Reviewed  URINALYSIS, ROUTINE W REFLEX MICROSCOPIC - Abnormal; Notable for the following:    APPearance CLOUDY (*)    Hgb urine dipstick TRACE (*)    Leukocytes, UA LARGE (*)    All other components within normal limits  URINE MICROSCOPIC-ADD ON - Abnormal; Notable for the following:    Squamous Epithelial / LPF MANY (*)  Bacteria, UA MANY (*)    All other components within normal limits  POCT PREGNANCY, URINE  WET PREP, GENITAL  GC/CHLAMYDIA PROBE AMP, GENITAL   No results found.   No diagnosis found. 1. UTI 2. Vaginal discharge    MDM  Patient feels improved. Labs support UTI, patient concerned with STD so will treat.         Rodena Medin, PA-C 03/15/12 1837

## 2012-03-16 NOTE — ED Provider Notes (Signed)
AMedical screening examination/treatment/procedure(s) were performed by non-physician practitioner and as supervising physician I was immediately available for consultation/collaboration.  Juliet Rude. Rubin Payor, MD 03/16/12 2333

## 2012-03-18 LAB — GC/CHLAMYDIA PROBE AMP, GENITAL
Chlamydia, DNA Probe: POSITIVE — AB
GC Probe Amp, Genital: POSITIVE — AB

## 2012-03-19 NOTE — ED Notes (Signed)
+   Chlamydia + Gonorrhea  Patient treated with Rocephin and Zithromax. DHHS letter faxed x 2 

## 2012-03-21 NOTE — ED Notes (Signed)
Patient informed of positive results after id'd x 2 and informed of need to notify partner to be treated. 

## 2012-04-06 ENCOUNTER — Inpatient Hospital Stay (HOSPITAL_COMMUNITY)
Admission: AD | Admit: 2012-04-06 | Discharge: 2012-04-06 | Disposition: A | Discharge status: Home / Self Care | Payer: 59 | Source: Ambulatory Visit | Attending: Obstetrics & Gynecology | Admitting: Obstetrics & Gynecology

## 2012-04-06 ENCOUNTER — Encounter (HOSPITAL_COMMUNITY): Payer: Self-pay | Admitting: Obstetrics and Gynecology

## 2012-04-06 DIAGNOSIS — N760 Acute vaginitis: Secondary | ICD-10-CM | POA: Insufficient documentation

## 2012-04-06 DIAGNOSIS — B9689 Other specified bacterial agents as the cause of diseases classified elsewhere: Secondary | ICD-10-CM | POA: Insufficient documentation

## 2012-04-06 DIAGNOSIS — N730 Acute parametritis and pelvic cellulitis: Secondary | ICD-10-CM

## 2012-04-06 DIAGNOSIS — R109 Unspecified abdominal pain: Secondary | ICD-10-CM | POA: Insufficient documentation

## 2012-04-06 DIAGNOSIS — N739 Female pelvic inflammatory disease, unspecified: Secondary | ICD-10-CM | POA: Insufficient documentation

## 2012-04-06 DIAGNOSIS — A499 Bacterial infection, unspecified: Secondary | ICD-10-CM | POA: Insufficient documentation

## 2012-04-06 LAB — URINALYSIS, ROUTINE W REFLEX MICROSCOPIC
Bilirubin Urine: NEGATIVE
Glucose, UA: NEGATIVE mg/dL
Hgb urine dipstick: NEGATIVE
Ketones, ur: NEGATIVE mg/dL
Leukocytes, UA: NEGATIVE
Nitrite: NEGATIVE
Protein, ur: NEGATIVE mg/dL
Specific Gravity, Urine: 1.025 (ref 1.005–1.030)
Urobilinogen, UA: 0.2 mg/dL (ref 0.0–1.0)
pH: 6 (ref 5.0–8.0)

## 2012-04-06 LAB — WET PREP, GENITAL
Trich, Wet Prep: NONE SEEN
Yeast Wet Prep HPF POC: NONE SEEN

## 2012-04-06 LAB — HCG, QUANTITATIVE, PREGNANCY: hCG, Beta Chain, Quant, S: <1 m[IU]/mL (ref <5)

## 2012-04-06 LAB — POCT PREGNANCY, URINE: Preg Test, Ur: NEGATIVE

## 2012-04-06 MED ORDER — PROMETHAZINE HCL 25 MG PO TABS
25.0000 mg | ORAL_TABLET | Freq: Once | ORAL | Status: AC
  Administered 2012-04-06: 25 mg via ORAL

## 2012-04-06 MED ORDER — CEFTRIAXONE SODIUM 250 MG IJ SOLR
250.0000 mg | INTRAMUSCULAR | Status: AC
  Administered 2012-04-06: 250 mg via INTRAMUSCULAR
  Filled 2012-04-06: qty 250

## 2012-04-06 MED ORDER — ACETAMINOPHEN 325 MG PO TABS
650.0000 mg | ORAL_TABLET | ORAL | Status: AC
  Administered 2012-04-06: 650 mg via ORAL
  Filled 2012-04-06: qty 2

## 2012-04-06 MED ORDER — AZITHROMYCIN 250 MG PO TABS
1000.0000 mg | ORAL_TABLET | ORAL | Status: AC
  Administered 2012-04-06: 1000 mg via ORAL
  Filled 2012-04-06: qty 4

## 2012-04-06 MED ORDER — PROMETHAZINE HCL 25 MG PO TABS
12.5000 mg | ORAL_TABLET | Freq: Four times a day (QID) | ORAL | Status: DC | PRN
  Filled 2012-04-06: qty 1

## 2012-04-06 MED ORDER — METRONIDAZOLE 500 MG PO TABS: 500.0000 mg | ORAL_TABLET | Freq: Two times a day (BID) | ORAL | Status: DC
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
## 2012-04-06 NOTE — MAU Note (Signed)
Onset of abdominal pain and lower back pain concerned pregnant in tubes (history of tubal ligation 2 years ago) concerned may have STD, having a lot of white discharge, painful intercourse, LMP 02/12/12

## 2012-04-08 LAB — GC/CHLAMYDIA PROBE AMP, GENITAL
Chlamydia, DNA Probe: NEGATIVE
GC Probe Amp, Genital: NEGATIVE

## 2012-05-01 ENCOUNTER — Emergency Department (HOSPITAL_COMMUNITY): Payer: 59

## 2012-05-01 ENCOUNTER — Emergency Department (HOSPITAL_COMMUNITY)
Admission: EM | Admit: 2012-05-01 | Discharge: 2012-05-01 | Discharge status: Against Medical Advice | Payer: 59 | Attending: Emergency Medicine | Admitting: Emergency Medicine

## 2012-05-01 ENCOUNTER — Encounter (HOSPITAL_COMMUNITY): Payer: Self-pay

## 2012-05-01 DIAGNOSIS — R109 Unspecified abdominal pain: Secondary | ICD-10-CM | POA: Insufficient documentation

## 2012-05-01 DIAGNOSIS — R079 Chest pain, unspecified: Secondary | ICD-10-CM | POA: Insufficient documentation

## 2012-05-01 DIAGNOSIS — IMO0001 Reserved for inherently not codable concepts without codable children: Secondary | ICD-10-CM | POA: Insufficient documentation

## 2012-05-01 DIAGNOSIS — R61 Generalized hyperhidrosis: Secondary | ICD-10-CM | POA: Insufficient documentation

## 2012-05-01 LAB — CK TOTAL AND CKMB (NOT AT ARMC)
CK, MB: 1.3 ng/mL (ref 0.3–4.0)
Relative Index: 1.3 (ref 0.0–2.5)
Total CK: 102 U/L (ref 7–177)

## 2012-05-01 LAB — POCT I-STAT TROPONIN I: Troponin i, poc: 0 ng/mL (ref 0.00–0.08)

## 2012-05-01 LAB — BASIC METABOLIC PANEL
BUN: 12 mg/dL (ref 6–23)
CO2: 22 mEq/L (ref 19–32)
Calcium: 9.4 mg/dL (ref 8.4–10.5)
Chloride: 105 mEq/L (ref 96–112)
Creatinine, Ser: 0.55 mg/dL (ref 0.50–1.10)
GFR calc Af Amer: >90 mL/min (ref >90)
GFR calc non Af Amer: >90 mL/min (ref >90)
Glucose, Bld: 87 mg/dL (ref 70–99)
Potassium: 3.9 mEq/L (ref 3.5–5.1)
Sodium: 139 mEq/L (ref 135–145)

## 2012-05-01 LAB — CBC
HCT: 38.4 % (ref 36.0–46.0)
Hemoglobin: 13.5 g/dL (ref 12.0–15.0)
MCH: 25.8 pg — ABNORMAL LOW (ref 26.0–34.0)
MCHC: 35.2 g/dL (ref 30.0–36.0)
MCV: 73.4 fL — ABNORMAL LOW (ref 78.0–100.0)
Platelets: 258 10*3/uL (ref 150–400)
RBC: 5.23 MIL/uL — ABNORMAL HIGH (ref 3.87–5.11)
RDW: 13.3 % (ref 11.5–15.5)
WBC: 9.5 10*3/uL (ref 4.0–10.5)

## 2012-05-01 NOTE — ED Notes (Signed)
Pt called x1 for vitals, did not respond

## 2012-05-01 NOTE — ED Notes (Signed)
Pt complains of chest pain and body aches sts sweat spells, sts that heart hx and pt sts she lives in home with black mold.

## 2012-05-01 NOTE — ED Notes (Signed)
Called x 2 no response

## 2012-05-27 ENCOUNTER — Encounter (HOSPITAL_COMMUNITY): Payer: Self-pay | Admitting: *Deleted

## 2012-05-27 ENCOUNTER — Emergency Department (HOSPITAL_COMMUNITY): Payer: 59

## 2012-05-27 ENCOUNTER — Emergency Department (HOSPITAL_COMMUNITY)
Admission: EM | Admit: 2012-05-27 | Discharge: 2012-05-27 | Disposition: A | Discharge status: Home / Self Care | Payer: 59 | Attending: Emergency Medicine | Admitting: Emergency Medicine

## 2012-05-27 DIAGNOSIS — I10 Essential (primary) hypertension: Secondary | ICD-10-CM | POA: Insufficient documentation

## 2012-05-27 DIAGNOSIS — R0602 Shortness of breath: Secondary | ICD-10-CM | POA: Insufficient documentation

## 2012-05-27 DIAGNOSIS — R109 Unspecified abdominal pain: Secondary | ICD-10-CM | POA: Insufficient documentation

## 2012-05-27 DIAGNOSIS — R110 Nausea: Secondary | ICD-10-CM | POA: Insufficient documentation

## 2012-05-27 DIAGNOSIS — M549 Dorsalgia, unspecified: Secondary | ICD-10-CM | POA: Insufficient documentation

## 2012-05-27 DIAGNOSIS — R079 Chest pain, unspecified: Secondary | ICD-10-CM | POA: Insufficient documentation

## 2012-05-27 LAB — POCT I-STAT, CHEM 8
BUN: 7 mg/dL (ref 6–23)
Calcium, Ion: 1.18 mmol/L (ref 1.12–1.23)
Chloride: 106 mEq/L (ref 96–112)
Creatinine, Ser: 0.6 mg/dL (ref 0.50–1.10)
Glucose, Bld: 96 mg/dL (ref 70–99)
HCT: 41 % (ref 36.0–46.0)
Hemoglobin: 13.9 g/dL (ref 12.0–15.0)
Potassium: 3.7 mEq/L (ref 3.5–5.1)
Sodium: 143 mEq/L (ref 135–145)
TCO2: 23 mmol/L (ref 0–100)

## 2012-05-27 LAB — URINALYSIS, ROUTINE W REFLEX MICROSCOPIC
Bilirubin Urine: NEGATIVE
Glucose, UA: NEGATIVE mg/dL
Ketones, ur: NEGATIVE mg/dL
Leukocytes, UA: NEGATIVE
Nitrite: NEGATIVE
Protein, ur: NEGATIVE mg/dL
Specific Gravity, Urine: 1.034 — ABNORMAL HIGH (ref 1.005–1.030)
Urobilinogen, UA: 0.2 mg/dL (ref 0.0–1.0)
pH: 5.5 (ref 5.0–8.0)

## 2012-05-27 LAB — URINE MICROSCOPIC-ADD ON

## 2012-05-27 LAB — POCT I-STAT TROPONIN I: Troponin i, poc: 0 ng/mL (ref 0.00–0.08)

## 2012-05-27 LAB — POCT PREGNANCY, URINE: Preg Test, Ur: NEGATIVE

## 2012-05-27 MED ORDER — IBUPROFEN 200 MG PO TABS
600.0000 mg | ORAL_TABLET | Freq: Once | ORAL | Status: AC
  Administered 2012-05-27: 600 mg via ORAL
  Filled 2012-05-27: qty 3

## 2012-05-27 MED ORDER — GI COCKTAIL ~~LOC~~
30.0000 mL | Freq: Once | ORAL | Status: AC
  Administered 2012-05-27: 30 mL via ORAL
  Filled 2012-05-27: qty 30

## 2012-05-27 MED ORDER — IBUPROFEN 600 MG PO TABS: 600.0000 mg | ORAL_TABLET | Freq: Three times a day (TID) | ORAL | Status: DC | PRN

## 2012-05-27 MED ORDER — OXYCODONE-ACETAMINOPHEN 5-325 MG PO TABS
1.0000 | ORAL_TABLET | Freq: Once | ORAL | Status: AC
  Administered 2012-05-27: 1 via ORAL
  Filled 2012-05-27: qty 1

## 2012-05-27 NOTE — ED Provider Notes (Signed)
History     CSN: [id number]  Arrival date & time 05/27/12  1223   First MD Initiated Contact with Patient 05/27/12 1249      Chief Complaint  Patient presents with  . Chest Pain  . Back Pain    left scapular  . Night Sweats  . Nausea  . Abdominal Pain    (Consider location/radiation/quality/duration/timing/severity/associated sxs/prior treatment) The history is provided by the patient.   The patient reports pain in her anterior chest over the past 3 days it is worse with movement and palpation.  She reports the pain radiates through to her back.  She denies shortness of breath.  She's had nausea but denies upper abdominal pain.  She has no shortness of breath.  She's had no recent cough or congestion.  She reports nausea and night sweats without documented fever.  She denies dysuria or urinary frequency.  She has no vomiting or diarrhea.  She denies lower bowel pain.  She has no new vaginal complaints.  She recently moved to the area from [place] and does not have a primary care physician.  She has a history of hypertension anxiety IBS and bipolar disorder Past Medical History  Diagnosis Date  . Hypertension   . Anxiety   . Irritable bowel   . Bipolar 1 disorder   . Apnea, sleep     Past Surgical History  Procedure Date  . Ovarian cyst removal     No family history on file.  History  Substance Use Topics  . Smoking status: Current Everyday Smoker -- 0.0 packs/day    Types: Cigarettes  . Smokeless tobacco: Not on file  . Alcohol Use: Yes     occasional drinker of all, but not in about a month    OB History    Grav Para Term Preterm Abortions TAB SAB Ect Mult Living   4 4 4       4       Review of Systems  Cardiovascular: Positive for chest pain.  Gastrointestinal: Positive for abdominal pain.  Musculoskeletal: Positive for back pain.  All other systems reviewed and are negative.    Allergies  Bactrim; Naproxen; and Sulfa antibiotics  Home Medications     Current Outpatient Rx  Name Route Sig Dispense Refill  . ASPIRIN 81 MG PO TABS Oral Take 81 mg by mouth daily.    [name] DIPHENHYDRAMINE HCL 25 MG PO CAPS Oral Take 25 mg by mouth every 6 (six) hours as needed. Allergies, itching      BP 121/96  Pulse 79  Temp 98.7 F (37.1 C) (Oral)  Resp 16  Wt 230 lb (104.327 kg)  SpO2 100%  LMP 05/10/2012  Physical Exam  Nursing note and vitals reviewed. Constitutional: She is oriented to person, place, and time. She appears well-developed and well-nourished. No distress.  HENT:  Head: Normocephalic and atraumatic.  Eyes: EOM are normal.  Neck: Normal range of motion.  Cardiovascular: Normal rate, regular rhythm and normal heart sounds.   Pulmonary/Chest: Effort normal and breath sounds normal.       Tenderness in anterior left chest without rash  Abdominal: Soft. She exhibits no distension. There is no tenderness.  Musculoskeletal: Normal range of motion.  Neurological: She is alert and oriented to person, place, and time.  Skin: Skin is warm and dry.  Psychiatric: She has a normal mood and affect. Judgment normal.    ED Course  Procedures (including critical care time)   Date: 05/27/2012  Rate: 77  Rhythm: normal sinus rhythm  QRS Axis: normal  Intervals: normal  ST/T Wave abnormalities: normal  Conduction Disutrbances: none  Narrative Interpretation:   Old EKG Reviewed: no prior ecg available     Labs Reviewed  URINALYSIS, ROUTINE W REFLEX MICROSCOPIC - Abnormal; Notable for the following:    Specific Gravity, Urine 1.034 (*)     Hgb urine dipstick LARGE (*)     All other components within normal limits  URINE MICROSCOPIC-ADD ON - Abnormal; Notable for the following:    Bacteria, UA FEW (*)     All other components within normal limits  POCT PREGNANCY, URINE  POCT I-STAT, CHEM 8  POCT I-STAT TROPONIN I   Dg Chest 2 View  05/27/2012  *RADIOLOGY REPORT*  Clinical Data: Shortness of breath, chest pain, abdominal pain,  back pain, night sweats and nausea.  CHEST - 2 VIEW  Comparison: 05/01/2012.  Findings: Trachea is midline.  Heart size normal.  Lungs are somewhat low in volume but clear.  No pleural fluid.  IMPRESSION: No acute findings.  Original Report Authenticated By: Reyes Ivan, M.D.    I personally reviewed the imaging tests through PACS system     1. Chest pain       MDM   the patient's chest pain seems more musculoskeletal in nature.  She is PERC negative.  I doubt this is ACS.  EKG and chest x-ray normal.  Her troponin is normal.  She feels somewhat better after anti-inflammatories.  She'll be treated with anti-inflammatory medicines at home.  She understands to return to the emergency department for new or worsening symptoms        Lyanne Co, MD 05/27/12 1427

## 2012-05-27 NOTE — ED Notes (Signed)
Pt states "don't have a primary doctor, I took some ASA & benadryl this morning, took 2 aspirin this morning, my chest hurts, my back hurts, my stomach hurts, I'm having night sweats, and I'm nauseated"

## 2012-06-16 ENCOUNTER — Emergency Department (HOSPITAL_COMMUNITY)
Admission: EM | Admit: 2012-06-16 | Discharge: 2012-06-16 | Disposition: A | Discharge status: Home / Self Care | Payer: 59 | Attending: Emergency Medicine | Admitting: Emergency Medicine

## 2012-06-16 ENCOUNTER — Encounter (HOSPITAL_COMMUNITY): Payer: Self-pay | Admitting: *Deleted

## 2012-06-16 DIAGNOSIS — N760 Acute vaginitis: Secondary | ICD-10-CM | POA: Insufficient documentation

## 2012-06-16 DIAGNOSIS — B9689 Other specified bacterial agents as the cause of diseases classified elsewhere: Secondary | ICD-10-CM | POA: Insufficient documentation

## 2012-06-16 DIAGNOSIS — A499 Bacterial infection, unspecified: Secondary | ICD-10-CM | POA: Insufficient documentation

## 2012-06-16 DIAGNOSIS — Z886 Allergy status to analgesic agent status: Secondary | ICD-10-CM | POA: Insufficient documentation

## 2012-06-16 DIAGNOSIS — Z882 Allergy status to sulfonamides status: Secondary | ICD-10-CM | POA: Insufficient documentation

## 2012-06-16 DIAGNOSIS — F17200 Nicotine dependence, unspecified, uncomplicated: Secondary | ICD-10-CM | POA: Insufficient documentation

## 2012-06-16 LAB — URINALYSIS, ROUTINE W REFLEX MICROSCOPIC
Bilirubin Urine: NEGATIVE
Glucose, UA: NEGATIVE mg/dL
Hgb urine dipstick: NEGATIVE
Ketones, ur: NEGATIVE mg/dL
Leukocytes, UA: NEGATIVE
Nitrite: NEGATIVE
Protein, ur: NEGATIVE mg/dL
Specific Gravity, Urine: 1.029 (ref 1.005–1.030)
Urobilinogen, UA: 1 mg/dL (ref 0.0–1.0)
pH: 5.5 (ref 5.0–8.0)

## 2012-06-16 LAB — WET PREP, GENITAL
Trich, Wet Prep: NONE SEEN
Yeast Wet Prep HPF POC: NONE SEEN

## 2012-06-16 LAB — POCT PREGNANCY, URINE: Preg Test, Ur: NEGATIVE

## 2012-06-16 MED ORDER — AZITHROMYCIN 250 MG PO TABS
1000.0000 mg | ORAL_TABLET | Freq: Once | ORAL | Status: AC
  Administered 2012-06-16: 1000 mg via ORAL
  Filled 2012-06-16: qty 4

## 2012-06-16 MED ORDER — METRONIDAZOLE 500 MG PO TABS: 500.0000 mg | ORAL_TABLET | Freq: Two times a day (BID) | ORAL | Status: DC

## 2012-06-16 MED ORDER — CEFTRIAXONE SODIUM 250 MG IJ SOLR
250.0000 mg | Freq: Once | INTRAMUSCULAR | Status: AC
  Administered 2012-06-16: 250 mg via INTRAMUSCULAR
  Filled 2012-06-16: qty 250

## 2012-06-16 MED ORDER — HYDROCODONE-ACETAMINOPHEN 5-325 MG PO TABS
1.0000 | ORAL_TABLET | Freq: Once | ORAL | Status: AC
  Administered 2012-06-16: 1 via ORAL
  Filled 2012-06-16: qty 1

## 2012-06-16 NOTE — ED Provider Notes (Signed)
I saw and evaluated the patient, reviewed the resident's note and I agree with the findings and plan.   Leah Baker, MD 06/16/12 [phone]

## 2012-06-16 NOTE — ED Notes (Signed)
Reports lower abd pain x 2 days, having vaginal discharge, itching, pain with urination and frequent  Urination.

## 2012-06-16 NOTE — ED Provider Notes (Signed)
History     CSN: [id number]  Arrival date & time 06/16/12  1430   First MD Initiated Contact with Patient 06/16/12 1720      Chief Complaint  Patient presents with  . Abdominal Pain  . Vaginal Discharge    (Consider location/radiation/quality/duration/timing/severity/associated sxs/prior treatment) Patient is a 26 y.o. female presenting with female genitourinary complaint. The history is provided by the patient.  Female GU Problem Primary symptoms include discharge, dyspareunia, genital pain, genital itching and dysuria.  Primary symptoms include no pelvic pain, no genital lesions, no genital rash and no vaginal bleeding. There has been no fever. This is a new problem. The current episode started more than 2 days ago (1 week). The problem occurs constantly. The problem has been gradually worsening. The symptoms occur after intercourse. She is not pregnant. She has not missed her period. Her LMP was weeks ago. The patient's menstrual history has been regular. The discharge was white. Associated symptoms include abdominal pain (suprapubic). Pertinent negatives include no anorexia, no abdominal swelling, no constipation, no diarrhea, no nausea, no vomiting and no frequency. She has tried nothing for the symptoms. Sexual activity: sexually active. There is a concern regarding sexually transmitted diseases. Associated medical issues include ovarian cysts.    Past Medical History  Diagnosis Date  . Hypertension   . Anxiety   . Irritable bowel   . Bipolar 1 disorder   . Apnea, sleep     Past Surgical History  Procedure Date  . Ovarian cyst removal     History reviewed. No pertinent family history.  History  Substance Use Topics  . Smoking status: Current Everyday Smoker -- 0.0 packs/day    Types: Cigarettes  . Smokeless tobacco: Not on file  . Alcohol Use: Yes     occasional drinker of all, but not in about a month    OB History    Grav Para Term Preterm Abortions TAB SAB Ect  Mult Living   4 4 4       4       Review of Systems  Constitutional: Negative for fever and chills.  HENT: Negative.   Respiratory: Negative.   Cardiovascular: Negative.   Gastrointestinal: Positive for abdominal pain (suprapubic). Negative for nausea, vomiting, diarrhea, constipation, blood in stool and anorexia.  Genitourinary: Positive for dysuria, vaginal discharge, vaginal pain and dyspareunia. Negative for frequency, hematuria, vaginal bleeding and pelvic pain.  Musculoskeletal: Negative.   All other systems reviewed and are negative.    Allergies  Bactrim; Naproxen; and Sulfa antibiotics  Home Medications   Current Outpatient Rx  Name Route Sig Dispense Refill  . ASPIRIN EC 81 MG PO TBEC Oral Take 162 mg by mouth once.    . IBUPROFEN 200 MG PO TABS Oral Take 200 mg by mouth every 6 (six) hours as needed. For pain    . METRONIDAZOLE 500 MG PO TABS Oral Take 1 tablet (500 mg total) by mouth 2 (two) times daily. 14 tablet 0    BP 131/82  Pulse 60  Temp 98 F (36.7 C) (Oral)  Resp 20  SpO2 99%  LMP 05/10/2012  Physical Exam  Nursing note and vitals reviewed. Constitutional: She is oriented to person, place, and time. She appears well-developed and well-nourished. No distress.  HENT:  Head: Normocephalic and atraumatic.  Eyes: Conjunctivae are normal.  Neck: Neck supple.  Cardiovascular: Normal rate, regular rhythm, normal heart sounds and intact distal pulses.   Pulmonary/Chest: Effort normal and breath sounds normal. She  has no wheezes. She has no rales.  Abdominal: Soft. She exhibits no distension. There is tenderness in the suprapubic area. There is no rigidity and no guarding.  Genitourinary: Cervix exhibits no motion tenderness. Right adnexum displays no mass, no tenderness and no fullness. Left adnexum displays no mass, no tenderness and no fullness. Vaginal discharge found.  Musculoskeletal: Normal range of motion.  Neurological: She is alert and oriented to  person, place, and time.  Skin: Skin is warm and dry.    ED Course  Procedures (including critical care time)  Labs Reviewed  WET PREP, GENITAL - Abnormal; Notable for the following:    Clue Cells Wet Prep HPF POC MODERATE (*)     WBC, Wet Prep HPF POC MODERATE (*)     All other components within normal limits  URINALYSIS, ROUTINE W REFLEX MICROSCOPIC  POCT PREGNANCY, URINE  GC/CHLAMYDIA PROBE AMP, GENITAL   No results found.   1. Bacterial vaginosis       MDM  26 yo female with 1 week history of vaginal discharge, itching, dyspareunia, and suprapubic pain. No fever, vomiting, or back pain.  Pt concerned for possible STD exposure.  AF, VSS, NAD at presentation.  Suprapubic TTP, no RLQ pain.   Pelvic exam with white discharge.  No CMT, adnexal fullness, tenderness or mass.  No signs of PID.  Will empirically treat for GC with Rocephin and Azithromycin.    UPT negative. No signs of torsion.  Wet prep consistent with BV.  Will DC home with course of metronidazole.  Return precautions provided.      Cherre Robins, MD 06/17/12 [phone]

## 2012-06-16 NOTE — ED Notes (Signed)
Pt requesting pain medication, MD notified.

## 2012-06-18 LAB — GC/CHLAMYDIA PROBE AMP, GENITAL
Chlamydia, DNA Probe: POSITIVE — AB
GC Probe Amp, Genital: UNDETERMINED

## 2012-06-18 NOTE — ED Provider Notes (Signed)
I saw and evaluated the patient, reviewed the resident's note and I agree with the findings and plan.  Toy Baker, MD 06/18/12 [phone]

## 2012-06-19 NOTE — ED Notes (Signed)
+   Chlamydia Patient treated with Rocephin and Zithromax-DHHS letter faxed 

## 2012-06-20 NOTE — ED Notes (Signed)
Unable to contact patient via phone-letter sent to [facility] address. 

## 2012-06-21 ENCOUNTER — Inpatient Hospital Stay (HOSPITAL_COMMUNITY): Payer: Medicaid - Out of State

## 2012-06-21 ENCOUNTER — Encounter (HOSPITAL_COMMUNITY): Payer: Self-pay | Admitting: Obstetrics

## 2012-06-21 ENCOUNTER — Inpatient Hospital Stay (HOSPITAL_COMMUNITY)
Admission: AD | Admit: 2012-06-21 | Discharge: 2012-06-26 | DRG: 759 | Disposition: A | Discharge status: Home / Self Care | Payer: Medicaid - Out of State | Source: Ambulatory Visit | Attending: Obstetrics | Admitting: Obstetrics

## 2012-06-21 DIAGNOSIS — N730 Acute parametritis and pelvic cellulitis: Principal | ICD-10-CM | POA: Diagnosis present

## 2012-06-21 DIAGNOSIS — N949 Unspecified condition associated with female genital organs and menstrual cycle: Secondary | ICD-10-CM | POA: Diagnosis present

## 2012-06-21 DIAGNOSIS — N731 Chronic parametritis and pelvic cellulitis: Secondary | ICD-10-CM | POA: Diagnosis present

## 2012-06-21 DIAGNOSIS — A5619 Other chlamydial genitourinary infection: Secondary | ICD-10-CM | POA: Diagnosis present

## 2012-06-21 LAB — DIFFERENTIAL
Basophils Absolute: 0 10*3/uL (ref 0.0–0.1)
Basophils Relative: 1 % (ref 0–1)
Eosinophils Absolute: 0.2 10*3/uL (ref 0.0–0.7)
Eosinophils Relative: 2 % (ref 0–5)
Lymphocytes Relative: 34 % (ref 12–46)
Lymphs Abs: 2.7 10*3/uL (ref 0.7–4.0)
Monocytes Absolute: 1 10*3/uL (ref 0.1–1.0)
Monocytes Relative: 12 % (ref 3–12)
Neutro Abs: 4 10*3/uL (ref 1.7–7.7)
Neutrophils Relative %: 51 % (ref 43–77)

## 2012-06-21 LAB — URINALYSIS, ROUTINE W REFLEX MICROSCOPIC
Bilirubin Urine: NEGATIVE
Glucose, UA: NEGATIVE mg/dL
Ketones, ur: NEGATIVE mg/dL
Leukocytes, UA: NEGATIVE
Nitrite: NEGATIVE
Protein, ur: NEGATIVE mg/dL
Specific Gravity, Urine: 1.02 (ref 1.005–1.030)
Urobilinogen, UA: 4 mg/dL — ABNORMAL HIGH (ref 0.0–1.0)
pH: 8.5 — ABNORMAL HIGH (ref 5.0–8.0)

## 2012-06-21 LAB — COMPREHENSIVE METABOLIC PANEL
ALT: 16 U/L (ref 0–35)
AST: 17 U/L (ref 0–37)
Albumin: 4 g/dL (ref 3.5–5.2)
Alkaline Phosphatase: 105 U/L (ref 39–117)
BUN: 9 mg/dL (ref 6–23)
CO2: 28 mEq/L (ref 19–32)
Calcium: 9.3 mg/dL (ref 8.4–10.5)
Chloride: 102 mEq/L (ref 96–112)
Creatinine, Ser: 0.64 mg/dL (ref 0.50–1.10)
GFR calc Af Amer: >90 mL/min (ref >90)
GFR calc non Af Amer: >90 mL/min (ref >90)
Glucose, Bld: 111 mg/dL — ABNORMAL HIGH (ref 70–99)
Potassium: 3.7 mEq/L (ref 3.5–5.1)
Sodium: 139 mEq/L (ref 135–145)
Total Bilirubin: 0.3 mg/dL (ref 0.3–1.2)
Total Protein: 7.4 g/dL (ref 6.0–8.3)

## 2012-06-21 LAB — CBC
HCT: 37.9 % (ref 36.0–46.0)
Hemoglobin: 13.1 g/dL (ref 12.0–15.0)
MCH: 25.6 pg — ABNORMAL LOW (ref 26.0–34.0)
MCHC: 34.6 g/dL (ref 30.0–36.0)
MCV: 74.2 fL — ABNORMAL LOW (ref 78.0–100.0)
Platelets: 212 10*3/uL (ref 150–400)
RBC: 5.11 MIL/uL (ref 3.87–5.11)
RDW: 14.4 % (ref 11.5–15.5)
WBC: 7.9 10*3/uL (ref 4.0–10.5)

## 2012-06-21 LAB — URINE MICROSCOPIC-ADD ON

## 2012-06-21 LAB — MRSA PCR SCREENING: MRSA by PCR: POSITIVE — AB

## 2012-06-21 MED ORDER — NALOXONE HCL 0.4 MG/ML IJ SOLN: 0.4000 mg | INTRAMUSCULAR | Status: DC | PRN

## 2012-06-21 MED ORDER — ZOLPIDEM TARTRATE 5 MG PO TABS
5.0000 mg | ORAL_TABLET | Freq: Every evening | ORAL | Status: DC | PRN
  Administered 2012-06-21 – 2012-06-25 (×3): 5 mg via ORAL
  Filled 2012-06-21 (×3): qty 1

## 2012-06-21 MED ORDER — HYDROMORPHONE 0.3 MG/ML IV SOLN
INTRAVENOUS | Status: DC
  Administered 2012-06-21: 16:00:00 via INTRAVENOUS
  Administered 2012-06-21: 4.8 mg via INTRAVENOUS
  Administered 2012-06-22: 4.2 mg via INTRAVENOUS
  Administered 2012-06-22: 12:00:00 via INTRAVENOUS
  Administered 2012-06-22: 2.3 mg via INTRAVENOUS
  Administered 2012-06-22: 4.3 mg via INTRAVENOUS
  Administered 2012-06-22: 1.8 mg via INTRAVENOUS
  Administered 2012-06-22: via INTRAVENOUS
  Administered 2012-06-22: 2.4 mg via INTRAVENOUS
  Administered 2012-06-23: 1.5 mg via INTRAVENOUS
  Administered 2012-06-23: 0.3 mg via INTRAVENOUS
  Administered 2012-06-23: 3.6 mg via INTRAVENOUS
  Administered 2012-06-23: 10:00:00 via INTRAVENOUS
  Administered 2012-06-23: 4.5 mg via INTRAVENOUS
  Administered 2012-06-24: 0.6 mg via INTRAVENOUS
  Administered 2012-06-24: 02:00:00 via INTRAVENOUS
  Filled 2012-06-21 (×6): qty 25

## 2012-06-21 MED ORDER — LACTATED RINGERS IV SOLN
INTRAVENOUS | Status: DC
  Administered 2012-06-21 – 2012-06-26 (×9): via INTRAVENOUS

## 2012-06-21 MED ORDER — KETOROLAC TROMETHAMINE 30 MG/ML IJ SOLN
30.0000 mg | Freq: Four times a day (QID) | INTRAMUSCULAR | Status: DC
  Administered 2012-06-21 – 2012-06-25 (×16): 30 mg via INTRAVENOUS
  Filled 2012-06-21: qty 2
  Filled 2012-06-21 (×14): qty 1

## 2012-06-21 MED ORDER — OXYCODONE-ACETAMINOPHEN 5-325 MG PO TABS: 1.0000 | ORAL_TABLET | ORAL | Status: DC | PRN

## 2012-06-21 MED ORDER — SODIUM CHLORIDE 0.9 % IJ SOLN: 9.0000 mL | INTRAMUSCULAR | Status: DC | PRN

## 2012-06-21 MED ORDER — DIPHENHYDRAMINE HCL 12.5 MG/5ML PO ELIX
12.5000 mg | ORAL_SOLUTION | Freq: Four times a day (QID) | ORAL | Status: DC | PRN
  Administered 2012-06-21 – 2012-06-22 (×2): 12.5 mg via ORAL
  Filled 2012-06-21 (×2): qty 5

## 2012-06-21 MED ORDER — ONDANSETRON HCL 4 MG PO TABS
4.0000 mg | ORAL_TABLET | Freq: Four times a day (QID) | ORAL | Status: DC | PRN
  Administered 2012-06-22: 4 mg via ORAL
  Filled 2012-06-21: qty 1

## 2012-06-21 MED ORDER — ONDANSETRON HCL 4 MG/2ML IJ SOLN: 4.0000 mg | Freq: Four times a day (QID) | INTRAMUSCULAR | Status: DC | PRN

## 2012-06-21 MED ORDER — PRENATAL MULTIVITAMIN CH
1.0000 | ORAL_TABLET | Freq: Every day | ORAL | Status: DC
  Administered 2012-06-23 – 2012-06-26 (×3): 1 via ORAL
  Filled 2012-06-21 (×3): qty 1

## 2012-06-21 MED ORDER — SIMETHICONE 80 MG PO CHEW: 80.0000 mg | CHEWABLE_TABLET | Freq: Four times a day (QID) | ORAL | Status: DC | PRN

## 2012-06-21 MED ORDER — DIPHENHYDRAMINE HCL 50 MG/ML IJ SOLN
12.5000 mg | Freq: Four times a day (QID) | INTRAMUSCULAR | Status: DC | PRN
  Administered 2012-06-22 – 2012-06-24 (×3): 12.5 mg via INTRAVENOUS
  Filled 2012-06-21 (×3): qty 1

## 2012-06-21 MED ORDER — CEFOXITIN SODIUM 2 G IV SOLR
2.0000 g | Freq: Four times a day (QID) | INTRAVENOUS | Status: DC
  Administered 2012-06-21 – 2012-06-26 (×20): 2 g via INTRAVENOUS
  Filled 2012-06-21 (×22): qty 2

## 2012-06-21 MED ORDER — DOXYCYCLINE HYCLATE 100 MG PO TABS
100.0000 mg | ORAL_TABLET | Freq: Two times a day (BID) | ORAL | Status: DC
  Administered 2012-06-21 – 2012-06-26 (×10): 100 mg via ORAL
  Filled 2012-06-21 (×13): qty 1

## 2012-06-21 MED ORDER — DOXYCYCLINE HYCLATE 100 MG PO TABS
200.0000 mg | ORAL_TABLET | Freq: Once | ORAL | Status: AC
  Administered 2012-06-21: 200 mg via ORAL
  Filled 2012-06-21: qty 2

## 2012-06-21 MED ORDER — ONDANSETRON HCL 4 MG/2ML IJ SOLN
4.0000 mg | Freq: Four times a day (QID) | INTRAMUSCULAR | Status: DC | PRN
  Administered 2012-06-22: 4 mg via INTRAVENOUS
  Filled 2012-06-21 (×2): qty 2

## 2012-06-21 MED ORDER — KETOROLAC TROMETHAMINE 60 MG/2ML IM SOLN
60.0000 mg | Freq: Once | INTRAMUSCULAR | Status: DC
  Filled 2012-06-21: qty 2

## 2012-06-21 NOTE — H&P (Signed)
Leah Barry is an 26 y.o. female. Positive GC and Chlamydia in March 2013.  Now has positive chlamydia culture with severe diffuse pelvic pain c/w PID.  Will admit for IV antibiotics.  Pertinent Gynecological History: Menses: flow is light Bleeding: normal Contraception: see chart DES exposure: denies Blood transfusions: None Sexually transmitted diseases: recent diagnosis: March and August 2013 Previous GYN Procedures: ovarian cystectomy  Last mammogram: n/a Date: n/a Last pap: normal Date: 2012 OB History: G4, P4   Menstrual History: Menarche age: 43 Patient's last menstrual period was 05/10/2012.    Past Medical History  Diagnosis Date  . Hypertension   . Anxiety   . Irritable bowel   . Bipolar 1 disorder   . Apnea, sleep     Past Surgical History  Procedure Date  . Ovarian cyst removal     No family history on file.  Social History:  reports that she has been smoking Cigarettes.  She has been smoking about .01 packs per day. She does not have any smokeless tobacco history on file. She reports that she drinks alcohol. She reports that she does not use illicit drugs.  Allergies:  Allergies  Allergen Reactions  . Bactrim Swelling    Eyes swell  . Naproxen Other (See Comments)    Nose bleeds  . Sulfa Antibiotics Swelling    Prescriptions prior to admission  Medication Sig Dispense Refill  . aspirin EC 81 MG tablet Take 162 mg by mouth once.      [name] ibuprofen (ADVIL,MOTRIN) 200 MG tablet Take 200 mg by mouth every 6 (six) hours as needed. For pain      . metroNIDAZOLE (FLAGYL) 500 MG tablet Take 1 tablet (500 mg total) by mouth 2 (two) times daily.  14 tablet  0    Review of Systems  Constitutional: Positive for fever.  Gastrointestinal: Positive for abdominal pain.  Musculoskeletal: Positive for back pain.  All other systems reviewed and are negative.    Blood pressure 109/57, pulse 91, temperature 97.8 F (36.6 C), temperature source Oral, resp.  rate 20, last menstrual period 05/10/2012, SpO2 97.00%. Physical Exam  Nursing note and vitals reviewed. Constitutional: She is oriented to person, place, and time. She appears well-developed and well-nourished.  HENT:  Head: Normocephalic and atraumatic.  Eyes: Conjunctivae are normal. Pupils are equal, round, and reactive to light.  Neck: Normal range of motion. Neck supple.  Cardiovascular: Normal rate and regular rhythm.   Respiratory: Effort normal and breath sounds normal.  GI: Soft. Bowel sounds are normal. There is tenderness.  Musculoskeletal: Normal range of motion.  Neurological: She is alert and oriented to person, place, and time.  Skin: Skin is warm and dry.  Psychiatric: She has a normal mood and affect. Her behavior is normal. Judgment and thought content normal.    No results found for this or any previous visit (from the past 24 hour(s)).  No results found.  Assessment/Plan: Acute PID.  Failed outpatient therapy.  Admitted for IV antibiotics.  Daniele Yankowski A 06/21/2012, 2:06 PM

## 2012-06-21 NOTE — Progress Notes (Signed)
Pt. 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 Nasal swab was sent.

## 2012-06-22 ENCOUNTER — Encounter (HOSPITAL_COMMUNITY): Payer: Self-pay | Admitting: *Deleted

## 2012-06-22 MED ORDER — SERTRALINE HCL 100 MG PO TABS
200.0000 mg | ORAL_TABLET | Freq: Every day | ORAL | Status: DC
  Administered 2012-06-23 – 2012-06-26 (×4): 200 mg via ORAL
  Filled 2012-06-22 (×7): qty 2

## 2012-06-22 MED ORDER — MUPIROCIN 2 % EX OINT
1.0000 "application " | TOPICAL_OINTMENT | Freq: Two times a day (BID) | CUTANEOUS | Status: DC
  Administered 2012-06-22 – 2012-06-26 (×8): 1 via NASAL
  Filled 2012-06-22: qty 22

## 2012-06-22 MED ORDER — LORAZEPAM 1 MG PO TABS
1.0000 mg | ORAL_TABLET | Freq: Every day | ORAL | Status: DC | PRN
  Administered 2012-06-22 – 2012-06-23 (×2): 1 mg via ORAL
  Filled 2012-06-22 (×2): qty 1

## 2012-06-22 MED ORDER — CLOTRIMAZOLE 1 % VA CREA
1.0000 | TOPICAL_CREAM | Freq: Every day | VAGINAL | Status: DC
  Administered 2012-06-22 – 2012-06-25 (×3): 1 via VAGINAL
  Filled 2012-06-22: qty 45

## 2012-06-22 MED ORDER — QUETIAPINE FUMARATE 200 MG PO TABS
200.0000 mg | ORAL_TABLET | Freq: Every day | ORAL | Status: DC
  Administered 2012-06-22 – 2012-06-25 (×4): 200 mg via ORAL
  Filled 2012-06-22 (×6): qty 1

## 2012-06-22 MED ORDER — CHLORHEXIDINE GLUCONATE CLOTH 2 % EX PADS
6.0000 | MEDICATED_PAD | Freq: Every day | CUTANEOUS | Status: AC
  Administered 2012-06-22 – 2012-06-26 (×5): 6 via TOPICAL

## 2012-06-22 NOTE — Progress Notes (Signed)
Patient ID: Leah Barry, female   DOB: 1986/01/19, 26 y.o.   MRN: [id number] Vital signs normal Abdomen not distended Still complains of abdominal pain receiving her antibiotics for PID and

## 2012-06-23 MED ORDER — LORAZEPAM 1 MG PO TABS
2.0000 mg | ORAL_TABLET | Freq: Four times a day (QID) | ORAL | Status: DC | PRN
  Administered 2012-06-23: 1 mg via ORAL
  Administered 2012-06-24 – 2012-06-25 (×7): 2 mg via ORAL
  Filled 2012-06-23 (×2): qty 2
  Filled 2012-06-23: qty 1
  Filled 2012-06-23 (×3): qty 2
  Filled 2012-06-23 (×2): qty 1
  Filled 2012-06-23: qty 2

## 2012-06-23 MED ORDER — LOPERAMIDE HCL 2 MG PO CAPS
2.0000 mg | ORAL_CAPSULE | ORAL | Status: DC | PRN
  Administered 2012-06-23: 2 mg via ORAL
  Filled 2012-06-23: qty 1

## 2012-06-23 NOTE — Progress Notes (Signed)
Patient ID: Leah Barry, female   DOB: 07-24-1986, 26 y.o.   MRN: [id number] Vital signs normal No complaints Continue present therapy

## 2012-06-24 MED ORDER — HYDROMORPHONE HCL 2 MG PO TABS
4.0000 mg | ORAL_TABLET | ORAL | Status: DC | PRN
  Administered 2012-06-24 – 2012-06-25 (×5): 4 mg via ORAL
  Administered 2012-06-25: 2 mg via ORAL
  Filled 2012-06-24 (×6): qty 2

## 2012-06-24 MED ORDER — OXYCODONE-ACETAMINOPHEN 5-325 MG PO TABS
1.0000 | ORAL_TABLET | ORAL | Status: DC | PRN
  Administered 2012-06-24: 2 via ORAL
  Filled 2012-06-24: qty 2

## 2012-06-24 NOTE — Progress Notes (Signed)
Ur chart review completed.  

## 2012-06-24 NOTE — Progress Notes (Signed)
Subjective: Patient reports pelvic pain   Objective: I have reviewed patient's vital signs, medications, labs, microbiology and radiology results.  General: alert and no distress GI: abnormal findings:  moderate tenderness in the entire abdomen Vaginal Bleeding: none   Assessment/Plan: PID.  Stable.  Continue antibiotics until pain decreases.  LOS: 3 days    HARPER,CHARLES A 06/24/2012, 11:59 AM

## 2012-06-24 NOTE — Progress Notes (Signed)
Pt. Verbalized concern about not receiving PCA for pain, explanation provided to her about regimine and use of po pain meds. Pt. Verbalized understanding, but still seemed a little anxious about not having PCA. Prn ativan given as ordered and upon pt. Request and then pt. proceeded to go for walk in hallway

## 2012-06-25 MED ORDER — HYDROMORPHONE HCL 2 MG PO TABS
4.0000 mg | ORAL_TABLET | ORAL | Status: DC | PRN
  Administered 2012-06-25 – 2012-06-26 (×3): 4 mg via ORAL
  Filled 2012-06-25 (×3): qty 2

## 2012-06-25 MED ORDER — IBUPROFEN 600 MG PO TABS
600.0000 mg | ORAL_TABLET | Freq: Four times a day (QID) | ORAL | Status: DC
  Administered 2012-06-25 – 2012-06-26 (×4): 600 mg via ORAL
  Filled 2012-06-25 (×5): qty 1

## 2012-06-25 NOTE — Progress Notes (Signed)
UR Chart review completed.  

## 2012-06-25 NOTE — Progress Notes (Signed)
Subjective: Patient reports less pain.   Objective: I have reviewed patient's labs.  General: alert and no distress GI: abnormal findings:  mild tenderness in the lower abdomen Extremities: extremities normal, atraumatic, no cyanosis or edema Vaginal Bleeding: none   Assessment/Plan: PID.  Improved.  Continue antibiotics until less pain.  LOS: 4 days    HARPER,CHARLES A 06/25/2012, 9:46 AM

## 2012-06-26 ENCOUNTER — Inpatient Hospital Stay (HOSPITAL_COMMUNITY)
Admission: AD | Admit: 2012-06-26 | Discharge: 2012-06-26 | Discharge status: Against Medical Advice | Payer: 59 | Source: Ambulatory Visit | Attending: Obstetrics | Admitting: Obstetrics

## 2012-06-26 DIAGNOSIS — N739 Female pelvic inflammatory disease, unspecified: Secondary | ICD-10-CM | POA: Insufficient documentation

## 2012-06-26 MED ORDER — HYDROMORPHONE HCL 4 MG PO TABS: 4.0000 mg | ORAL_TABLET | ORAL | Status: AC | PRN

## 2012-06-26 MED ORDER — METRONIDAZOLE 500 MG PO TABS: 500.0000 mg | ORAL_TABLET | Freq: Three times a day (TID) | ORAL | Status: DC

## 2012-06-26 MED ORDER — DOXYCYCLINE HYCLATE 100 MG PO TABS: 100.0000 mg | ORAL_TABLET | Freq: Two times a day (BID) | ORAL | Status: DC

## 2012-06-26 MED ORDER — IBUPROFEN 600 MG PO TABS: 600.0000 mg | ORAL_TABLET | Freq: Four times a day (QID) | ORAL | Status: DC

## 2012-06-26 NOTE — MAU Note (Signed)
Pt requesting to leave without being seen due to childcare issues.  Pt plans to return in the morning for care.  Pt informed she is the next pt to go to a room.  Continues to request to leave.

## 2012-06-26 NOTE — Progress Notes (Signed)
Pt discharged to a different address listed in [facility].  [facility] has address.  Pt ambulated out of hospital with her 22 month old daughter and stated she was taking the bus transit system home.  Pt left with saline lock which was clean, dry and intact.

## 2012-06-26 NOTE — Discharge Summary (Signed)
Physician Discharge Summary  Patient ID: Leah Barry MRN: [id number] DOB/AGE: [birth year] 28 y.o.  Admit date: 06/21/2012 Discharge date: 06/26/2012  Admission Diagnoses:  Chronic PID  Discharge Diagnoses:  Same Active Problems:  * No active hospital problems. 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  Consults: None  Significant Diagnostic Studies: labs: CBC  Treatments: antibiotics: Cefoxitin and Doxycycline  Discharge Exam: Blood pressure 120/70, pulse 60, temperature 98.6 F (37 C), temperature source Oral, resp. rate 17, height 5\' 7"  (1.702 m), weight 107.049 kg (236 lb), last menstrual period 05/10/2012, SpO2 96.00%. General appearance: alert and no distress GI: normal findings: Less tender. Extremities: extremities normal, atraumatic, no cyanosis or edema  Disposition: 01-Home or Self Care  Discharge Orders    Future Orders Please Complete By Expires   Diet - low sodium heart healthy      Face-to-face encounter (required for Medicare/Medicaid patients)      Scheduling Instructions:   Patient to have Cefotetan 2 grams every 12 hours IV through PIV at home to start tonight at 2000 and to end on 06-27-12 at 2000 with home health care.  PIV care per protocol.   Comments:   I Shevonne Wolf A certify that this patient is under my care and that I, or a nurse practitioner or physician's assistant working with me, had a face-to-face encounter that meets the physician face-to-face encounter requirements with this patient on 06/26/2012.   Questions: Responses:   The encounter with the patient was in whole, or in part, for the following medical condition, which is the primary reason for home health care Treatment of PID.   I certify that, based on my findings, the following services are medically necessary home health services Nursing   My clinical findings support the need for the above services  Infection requiring IV antibiotics   Further, I certify that my clinical findings support that this patient is homebound due to: Leaving home exacerbates symptoms (dyspnea, pain, anxiety, etc)   To provide the following care/treatments RN   Increase activity slowly      Discharge instructions      Comments:   Routine   Call MD for:      Call MD for:  temperature >100.4      Call MD for:  persistant nausea and vomiting      Call MD for:  severe uncontrolled pain      Call MD for:  difficulty breathing, headache or visual disturbances      Call MD for:  hives      Call MD for:  persistant dizziness or light-headedness      Call MD for:  extreme fatigue      Discharge patient        Medication List  As of 06/26/2012 10:11 AM   STOP taking these medications         LORAZEPAM PO      metroNIDAZOLE 500 MG tablet         TAKE these medications         doxycycline 100 MG tablet   Commonly known as: VIBRA-TABS   Take 1 tablet (100 mg total) by mouth 2 (two) times daily.      HYDROmorphone 4 MG tablet   Commonly known as: DILAUDID   Take 1 tablet (4 mg total) by mouth every 3 (three) hours as needed for pain.      ibuprofen 600 MG  tablet   Commonly known as: ADVIL,MOTRIN   Take 1 tablet (600 mg total) by mouth every 6 (six) hours.      QUEtiapine 200 MG tablet   Commonly known as: SEROQUEL   Take 200 mg by mouth at bedtime.      sertraline 100 MG tablet   Commonly known as: ZOLOFT   Take 200 mg by mouth every morning.           Follow-up Information    Follow up with Stokes Rattigan A, MD. Schedule an appointment as soon as possible for a visit in 6 weeks.   Contact information:   [address] [place] [address] [phone]          Signed: Brock Bad 06/26/2012, 10:11 AM

## 2012-06-26 NOTE — Plan of Care (Signed)
Problem: Phase III Progression Outcomes Goal: IV/normal saline lock discontinued Outcome: Not Met (add Reason) Pt home with saline lock

## 2012-06-26 NOTE — Care Management Note (Unsigned)
    Page 1 of 1   06/26/2012     5:06:11 PM   CARE MANAGEMENT NOTE 06/26/2012  Patient:  Leah Barry, Leah Barry   Account Number:  [id number]  Date Initiated:  06/25/2012  Documentation initiated by:  Hoy Finlay  Subjective/Objective Assessment:   Pelvic Inflammatory Disease     Action/Plan:   IV antibiotics  pain management   Anticipated DC Date:  06/26/2012   Anticipated DC Plan:           Choice offered to / List presented to:  C-1 Patient   DME arranged  IV PUMP/EQUIPMENT      DME agency  [facility]     [facility] arranged  HH-1 RN      [facility] agency  [facility]   Status of service:  Completed, signed off   Discharge Disposition:  Home Health Services  If discussed at Long Length of Stay Meetings, dates discussed:    Comments:  06/26/12 [address] [facility] [phone] patient dc home today with IV antibiotics on Mefoxin 2 gm  IV every 6 hours to start 1st visit at home today 06/26/12 at 1800 and to end 06/28/12 at 1800 on Friday. Patient offered and given list of Home Health agencies and she chose [facility]. Norberta Keens [phone] notified of referral and information given to her. Nurse care manager verified demographics with patient and notified Darl Pikes of correct address patient will be staying at. Patient was discharged with NSL in place for [facility] to use at home. No other needs identified at this time.  06/25/12 H. Montez Morita 1230- Admitted on 06/21/12 with dx of PID. Continues on IV antibiotics today. Continues to have abdominal tenderness. PCA d/c'd, but still receiving IV pain meds. Patient requesting PCA be restarted. Per MD, inpatient until decreased abdominal pain. CM will continue to follow.

## 2012-06-26 NOTE — Progress Notes (Signed)
Delorse Lek, RN called Dr. Verdell Carmine office to request update to patient's discharge orders to add flagyl.

## 2012-06-26 NOTE — MAU Note (Signed)
Pt D/C home from AICU today for PID, receiving IV antibiotics at home.  Today IV infiltrated and did not get 1730 dose of abx.  Needs IV start.

## 2012-06-26 NOTE — Plan of Care (Signed)
Problem: Phase III Progression Outcomes Goal: Discharge plan remains appropriate-arrangements made Outcome: Adequate for Discharge Pt will continue antibiotic therapy with home health through Friday 06/28/12.

## 2012-06-26 NOTE — Progress Notes (Signed)
Subjective: Patient reports less pain.  Objective: I have reviewed patient's vital signs, intake and output, medications, labs and microbiology.  General: alert and no distress GI: abnormal findings:  mild tenderness in the lower abdomen Extremities: extremities normal, atraumatic, no cyanosis or edema Vaginal Bleeding: none   Assessment/Plan: PID.  Improved.  Continue antibiotics for total of 7 days, then D/C home on 14 days of po Doxy/Flagyl.  LOS: 5 days    Ellamae Lybeck A 06/26/2012, 8:53 AM

## 2012-06-27 ENCOUNTER — Inpatient Hospital Stay (HOSPITAL_COMMUNITY)
Admission: AD | Admit: 2012-06-27 | Discharge: 2012-06-27 | Disposition: A | Discharge status: Home / Self Care | Payer: 59 | Source: Ambulatory Visit | Attending: Obstetrics | Admitting: Obstetrics

## 2012-06-27 ENCOUNTER — Encounter (HOSPITAL_COMMUNITY): Payer: Self-pay | Admitting: *Deleted

## 2012-06-27 DIAGNOSIS — N739 Female pelvic inflammatory disease, unspecified: Secondary | ICD-10-CM | POA: Insufficient documentation

## 2012-06-27 MED ORDER — SODIUM CHLORIDE 0.9 % IJ SOLN
INTRAMUSCULAR | Status: AC
  Filled 2012-06-27: qty 6

## 2012-06-27 NOTE — MAU Note (Signed)
Dr. Clearance Coots notified pt here for iv restart. Was d/c'd home from [facility] for PID. Was receiving HH abx, however iv infiltrated. Dr. Clearance Coots ordered iv to be d/c'd, pt may take doxycycline and flagyl as prescribed in office previously.

## 2012-06-27 NOTE — OB Triage Note (Signed)
Patient needs IV start. Missed 3 days of antibiotics for PID.

## 2012-07-09 ENCOUNTER — Encounter (HOSPITAL_COMMUNITY): Payer: Self-pay

## 2012-07-09 ENCOUNTER — Emergency Department (HOSPITAL_COMMUNITY)
Admission: EM | Admit: 2012-07-09 | Discharge: 2012-07-09 | Disposition: A | Discharge status: Home / Self Care | Payer: Medicaid Other | Attending: Emergency Medicine | Admitting: Emergency Medicine

## 2012-07-09 DIAGNOSIS — F411 Generalized anxiety disorder: Secondary | ICD-10-CM | POA: Insufficient documentation

## 2012-07-09 DIAGNOSIS — F319 Bipolar disorder, unspecified: Secondary | ICD-10-CM | POA: Insufficient documentation

## 2012-07-09 DIAGNOSIS — Z888 Allergy status to other drugs, medicaments and biological substances status: Secondary | ICD-10-CM | POA: Insufficient documentation

## 2012-07-09 DIAGNOSIS — M7989 Other specified soft tissue disorders: Secondary | ICD-10-CM | POA: Insufficient documentation

## 2012-07-09 DIAGNOSIS — I10 Essential (primary) hypertension: Secondary | ICD-10-CM | POA: Insufficient documentation

## 2012-07-09 DIAGNOSIS — Z882 Allergy status to sulfonamides status: Secondary | ICD-10-CM | POA: Insufficient documentation

## 2012-07-09 DIAGNOSIS — Z881 Allergy status to other antibiotic agents status: Secondary | ICD-10-CM | POA: Insufficient documentation

## 2012-07-09 MED ORDER — TRAMADOL HCL 50 MG PO TABS: 50.0000 mg | ORAL_TABLET | Freq: Four times a day (QID) | ORAL | Status: AC | PRN

## 2012-07-09 MED ORDER — CLINDAMYCIN HCL 150 MG PO CAPS: 150.0000 mg | ORAL_CAPSULE | Freq: Four times a day (QID) | ORAL | Status: AC

## 2012-07-09 NOTE — ED Provider Notes (Signed)
History     CSN: [id number]  Arrival date & time 07/09/12  0919   First MD Initiated Contact with Patient 07/09/12 [phone]      Chief Complaint  Patient presents with  . Insect Bite    (Consider location/radiation/quality/duration/timing/severity/associated sxs/prior treatment) HPI  26 year-old female presents for evaluations of insect bite. Patient reports she has noticed black spider around the house.  For the past 3 days she notices her R hand has been itching with increase swelling.  Denies sharp pain in dorsum of hand that radiates to her fingers, worsening with movement or with palpation.  Swelling has been increasing.  Itch has not helped with benadryl.  Pt denies fever, chills, cp, throat swelling, sob, n/v/d.  Did not see the bite.  Also reports she was in the hospital for PID 2 weeks ago and did have an IV on the same hand.  Does not think it is related to the IV.  No recent trauma.  Has taking ibuprofen without adequate relief.    Past Medical History  Diagnosis Date  . Hypertension   . Anxiety   . Irritable bowel   . Bipolar 1 disorder   . Apnea, sleep     Past Surgical History  Procedure Date  . Ovarian cyst removal     History reviewed. No pertinent family history.  History  Substance Use Topics  . Smoking status: Current Everyday Smoker -- 0.0 packs/day    Types: Cigarettes  . Smokeless tobacco: Not on file  . Alcohol Use: No     occasional drinker of all, but not in about a month    OB History    Grav Para Term Preterm Abortions TAB SAB Ect Mult Living   4 4 4       4       Review of Systems  All other systems reviewed and are negative.    Allergies  Bactrim; Naproxen; and Sulfa antibiotics  Home Medications   Current Outpatient Rx  Name Route Sig Dispense Refill  . ASPIRIN 325 MG PO TBEC Oral Take 650 mg by mouth every 4 (four) hours as needed. For pain    . DIPHENHYDRAMINE HCL 25 MG PO CAPS Oral Take 50 mg by mouth every 6 (six) hours as  needed. For allergic reaction    . DOXYCYCLINE HYCLATE 100 MG PO TABS Oral Take 100 mg by mouth 2 (two) times daily.    . IBUPROFEN 600 MG PO TABS Oral Take 600 mg by mouth every 6 (six) hours as needed. For pain    . METRONIDAZOLE 500 MG PO TABS Oral Take 500 mg by mouth 3 (three) times daily.    . OXYCODONE-ACETAMINOPHEN 5-325 MG PO TABS Oral Take 1 tablet by mouth every 4 (four) hours as needed. For pain    . QUETIAPINE FUMARATE 200 MG PO TABS Oral Take 200 mg by mouth at bedtime.    . SERTRALINE HCL 100 MG PO TABS Oral Take 200 mg by mouth every morning.    [name] DOXYCYCLINE HYCLATE 100 MG PO TABS Oral Take 1 tablet (100 mg total) by mouth 2 (two) times daily. 28 tablet 1    Treatment of PID.  [name] IBUPROFEN 600 MG PO TABS Oral Take 600 mg by mouth every 6 (six) hours. For pain    . METRONIDAZOLE 500 MG PO TABS Oral Take 1 tablet (500 mg total) by mouth 3 (three) times daily. 28 tablet 1    Treatment of PID.  BP 111/70  Pulse 77  Temp 98.8 F (37.1 C) (Oral)  Resp 18  Ht 5\' 7"  (1.702 m)  Wt 230 lb (104.327 kg)  BMI 36.02 kg/m2  SpO2 97%  LMP 06/10/2012  Physical Exam  Nursing note and vitals reviewed. Constitutional: She is oriented to person, place, and time. She appears well-developed and well-nourished. No distress.  HENT:  Head: Atraumatic.  Mouth/Throat: Oropharynx is clear and moist.  Eyes: Conjunctivae are normal.  Neck: Neck supple.  Musculoskeletal: She exhibits tenderness.       R hand: hand is moderately edematous, non pitting.  Tenderness throughout hand on palpation without focal tenderness. A small punctate erythematous lesion noted on dorsum of hand (non pustular, petechial, or vesicular lesion).  R wrist with FROM, no joint involvement.  Radial pulse 2+, brisk cap refills, no deformity.    Neurological: She is alert and oriented to person, place, and time.  Skin: No rash noted.    ED Course  Procedures (including critical care time)  Labs Reviewed - No data to  display No results found.   No diagnosis found.  1. R hand swelling  MDM  R hand pain with itching and swelling.  Sxs suggestive of localize reaction.  No joint involvement.  No signs of abscess or cellulitis however this could be early cellulitis.  Since pt is allergic to bactrim, plan to prescribe clindamycin.  No recent trauma to suggest bony fr or dislocation.  Neurovascularly intact.  Plan to recommend RICE therapy, benadryl for it, continue ibuprofen for pain.  Will give referral to hand specialist if sxs worsen.    i do not think this is thrombophlebitis, and low suspicion for spider bite from black widow or brown recluse.  Doubt septic arthritis.   BP 111/70  Pulse 77  Temp 98.8 F (37.1 C) (Oral)  Resp 18  Ht 5\' 7"  (1.702 m)  Wt 230 lb (104.327 kg)  BMI 36.02 kg/m2  SpO2 97%  LMP 06/10/2012         Fayrene Helper, PA-C 07/09/12 1011

## 2012-07-09 NOTE — ED Notes (Signed)
Pt reports she had IV that infiltrated in right hand during last admission

## 2012-07-09 NOTE — ED Notes (Signed)
Pt provided sandwich and drink upon discharge

## 2012-07-09 NOTE — ED Notes (Signed)
Pt states she has black spiders in her house and she believes she has been bitten by one.  Pt has red raised area on back of right hand.  Hand has non pitting edema that involves the wrist and fingers.  Pt reports itching and pain radiating into fingers and lower arm.

## 2012-07-09 NOTE — Discharge Instructions (Signed)
You have been evaluated for your R hand pain and swelling.  This could be early cellulitis.  Please take antibiotic and follow instruction below.  Return in 2 days if your symptoms worsen despite treatment.    RICE: Routine Care for Injuries The routine care of many injuries includes Rest, Ice, Compression, and Elevation (RICE). HOME CARE INSTRUCTIONS  Rest is needed to allow your body to heal. Routine activities can usually be resumed when comfortable. Injured tendons and bones can take up to 6 weeks to heal. Tendons are the cord-like structures that attach muscle to bone.   Ice following an injury helps keep the swelling down and reduces pain.   Put ice in a plastic bag.   Place a towel between your skin and the bag.   Leave the ice on for 15 to 20 minutes, 3 to 4 times a day. Do this while awake, for the first 24 to 48 hours. After that, continue as directed by your caregiver.   Compression helps keep swelling down. It also gives support and helps with discomfort. If an elastic bandage has been applied, it should be removed and reapplied every 3 to 4 hours. It should not be applied tightly, but firmly enough to keep swelling down. Watch fingers or toes for swelling, bluish discoloration, coldness, numbness, or excessive pain. If any of these problems occur, remove the bandage and reapply loosely. Contact your caregiver if these problems continue.   Elevation helps reduce swelling and decreases pain. With extremities, such as the arms, hands, legs, and feet, the injured area should be placed near or above the level of the heart, if possible.  SEEK IMMEDIATE MEDICAL CARE IF:  You have persistent pain and swelling.   You develop redness, numbness, or unexpected weakness.   Your symptoms are getting worse rather than improving after several days.  These symptoms may indicate that further evaluation or further X-rays are needed. Sometimes, X-rays may not show a small broken bone (fracture)  until 1 week or 10 days later. Make a follow-up appointment with your caregiver. Ask when your X-ray results will be ready. Make sure you get your X-ray results. Document Released: 02/11/2001 Document Revised: 10/19/2011 Document Reviewed: 03/31/2011 [facility] Patient Information 2012 [place], [place].  Cellulitis Cellulitis is an infection of the skin and the tissue beneath it. The area is typically red and tender. It is caused by germs (bacteria) (usually staph or strep) that enter the body through cuts or sores. Cellulitis most commonly occurs in the arms or lower legs.  HOME CARE INSTRUCTIONS   If you are given a prescription for medications which kill germs (antibiotics), take as directed until finished.   If the infection is on the arm or leg, keep the limb elevated as able.   Use a warm cloth several times per day to relieve pain and encourage healing.   See your caregiver for recheck of the infected site as directed if problems arise.   Only take over-the-counter or prescription medicines for pain, discomfort, or fever as directed by your caregiver.  SEEK MEDICAL CARE IF:   The area of redness (inflammation) is spreading, there are red streaks coming from the infected site, or if a part of the infection begins to turn dark in color.   The joint or bone underneath the infected skin becomes painful after the skin has healed.   The infection returns in the same or another area after it seems to have gone away.   A boil or  bump swells up. This may be an abscess.   New, unexplained problems such as pain or fever develop.  SEEK IMMEDIATE MEDICAL CARE IF:   You have a fever.   You or your child feels drowsy or lethargic.   There is vomiting, diarrhea, or lasting discomfort or feeling ill (malaise) with muscle aches and pains.  MAKE SURE YOU:   Understand these instructions.   Will watch your condition.   Will get help right away if you are not doing well or get worse.    Document Released: 08/09/2005 Document Revised: 10/19/2011 Document Reviewed: 06/17/2008 [facility] Patient Information 2012 [place], [place].

## 2012-07-11 NOTE — ED Provider Notes (Signed)
Medical screening examination/treatment/procedure(s) were performed by non-physician practitioner and as supervising physician I was immediately available for consultation/collaboration.  Cyndra Numbers, MD 07/11/12 1413

## 2012-08-07 ENCOUNTER — Emergency Department (HOSPITAL_COMMUNITY)
Admission: EM | Admit: 2012-08-07 | Discharge: 2012-08-08 | Disposition: A | Discharge status: Home / Self Care | Payer: Medicaid Other | Attending: Emergency Medicine | Admitting: Emergency Medicine

## 2012-08-07 ENCOUNTER — Encounter (HOSPITAL_COMMUNITY): Payer: Self-pay | Admitting: Emergency Medicine

## 2012-08-07 DIAGNOSIS — N730 Acute parametritis and pelvic cellulitis: Secondary | ICD-10-CM | POA: Insufficient documentation

## 2012-08-07 DIAGNOSIS — I10 Essential (primary) hypertension: Secondary | ICD-10-CM | POA: Insufficient documentation

## 2012-08-07 DIAGNOSIS — F17200 Nicotine dependence, unspecified, uncomplicated: Secondary | ICD-10-CM | POA: Insufficient documentation

## 2012-08-07 DIAGNOSIS — Z882 Allergy status to sulfonamides status: Secondary | ICD-10-CM | POA: Insufficient documentation

## 2012-08-07 DIAGNOSIS — Z881 Allergy status to other antibiotic agents status: Secondary | ICD-10-CM | POA: Insufficient documentation

## 2012-08-07 DIAGNOSIS — F319 Bipolar disorder, unspecified: Secondary | ICD-10-CM | POA: Insufficient documentation

## 2012-08-07 DIAGNOSIS — Z888 Allergy status to other drugs, medicaments and biological substances status: Secondary | ICD-10-CM | POA: Insufficient documentation

## 2012-08-07 DIAGNOSIS — F411 Generalized anxiety disorder: Secondary | ICD-10-CM | POA: Insufficient documentation

## 2012-08-07 LAB — CBC WITH DIFFERENTIAL/PLATELET
Basophils Absolute: 0 10*3/uL (ref 0.0–0.1)
Basophils Relative: 0 % (ref 0–1)
Eosinophils Absolute: 0.3 10*3/uL (ref 0.0–0.7)
Eosinophils Relative: 3 % (ref 0–5)
HCT: 35.1 % — ABNORMAL LOW (ref 36.0–46.0)
Hemoglobin: 12.3 g/dL (ref 12.0–15.0)
Lymphocytes Relative: 42 % (ref 12–46)
Lymphs Abs: 4.8 10*3/uL — ABNORMAL HIGH (ref 0.7–4.0)
MCH: 25.9 pg — ABNORMAL LOW (ref 26.0–34.0)
MCHC: 35 g/dL (ref 30.0–36.0)
MCV: 73.9 fL — ABNORMAL LOW (ref 78.0–100.0)
Monocytes Absolute: 0.9 10*3/uL (ref 0.1–1.0)
Monocytes Relative: 8 % (ref 3–12)
Neutro Abs: 5.5 10*3/uL (ref 1.7–7.7)
Neutrophils Relative %: 47 % (ref 43–77)
Platelets: 301 10*3/uL (ref 150–400)
RBC: 4.75 MIL/uL (ref 3.87–5.11)
RDW: 13.8 % (ref 11.5–15.5)
WBC: 11.5 10*3/uL — ABNORMAL HIGH (ref 4.0–10.5)

## 2012-08-07 LAB — URINALYSIS, ROUTINE W REFLEX MICROSCOPIC
Bilirubin Urine: NEGATIVE
Glucose, UA: NEGATIVE mg/dL
Hgb urine dipstick: NEGATIVE
Ketones, ur: NEGATIVE mg/dL
Leukocytes, UA: NEGATIVE
Nitrite: NEGATIVE
Protein, ur: NEGATIVE mg/dL
Specific Gravity, Urine: 1.027 (ref 1.005–1.030)
Urobilinogen, UA: 0.2 mg/dL (ref 0.0–1.0)
pH: 7 (ref 5.0–8.0)

## 2012-08-07 LAB — COMPREHENSIVE METABOLIC PANEL
ALT: 11 U/L (ref 0–35)
AST: 11 U/L (ref 0–37)
Albumin: 3.5 g/dL (ref 3.5–5.2)
Alkaline Phosphatase: 87 U/L (ref 39–117)
BUN: 8 mg/dL (ref 6–23)
CO2: 25 mEq/L (ref 19–32)
Calcium: 8.8 mg/dL (ref 8.4–10.5)
Chloride: 103 mEq/L (ref 96–112)
Creatinine, Ser: 0.46 mg/dL — ABNORMAL LOW (ref 0.50–1.10)
GFR calc Af Amer: >90 mL/min (ref >90)
GFR calc non Af Amer: >90 mL/min (ref >90)
Glucose, Bld: 85 mg/dL (ref 70–99)
Potassium: 3.4 mEq/L — ABNORMAL LOW (ref 3.5–5.1)
Sodium: 137 mEq/L (ref 135–145)
Total Bilirubin: 0.2 mg/dL — ABNORMAL LOW (ref 0.3–1.2)
Total Protein: 6.7 g/dL (ref 6.0–8.3)

## 2012-08-07 LAB — WET PREP, GENITAL
Clue Cells Wet Prep HPF POC: NONE SEEN
Trich, Wet Prep: NONE SEEN
Yeast Wet Prep HPF POC: NONE SEEN

## 2012-08-07 LAB — POCT PREGNANCY, URINE: Preg Test, Ur: NEGATIVE

## 2012-08-07 LAB — LIPASE, BLOOD: Lipase: 19 U/L (ref 11–59)

## 2012-08-07 MED ORDER — AZITHROMYCIN 250 MG PO TABS
1000.0000 mg | ORAL_TABLET | Freq: Once | ORAL | Status: AC
  Administered 2012-08-08: 1000 mg via ORAL
  Filled 2012-08-07: qty 1

## 2012-08-07 MED ORDER — CEFTRIAXONE SODIUM 250 MG IJ SOLR
250.0000 mg | Freq: Once | INTRAMUSCULAR | Status: AC
  Administered 2012-08-08: 250 mg via INTRAMUSCULAR
  Filled 2012-08-07: qty 250

## 2012-08-07 MED ORDER — ONDANSETRON 8 MG PO TBDP
8.0000 mg | ORAL_TABLET | Freq: Once | ORAL | Status: AC
  Administered 2012-08-07: 8 mg via ORAL
  Filled 2012-08-07: qty 1

## 2012-08-07 MED ORDER — PROMETHAZINE HCL 25 MG PO TABS: 25.0000 mg | ORAL_TABLET | Freq: Four times a day (QID) | ORAL | Status: DC | PRN

## 2012-08-07 MED ORDER — IBUPROFEN 800 MG PO TABS
800.0000 mg | ORAL_TABLET | Freq: Once | ORAL | Status: AC
  Administered 2012-08-07: 800 mg via ORAL
  Filled 2012-08-07: qty 1

## 2012-08-07 MED ORDER — DOXYCYCLINE HYCLATE 100 MG PO CAPS: 100.0000 mg | ORAL_CAPSULE | Freq: Two times a day (BID) | ORAL | Status: DC

## 2012-08-07 MED ORDER — HYDROCODONE-ACETAMINOPHEN 5-325 MG PO TABS: 1.0000 | ORAL_TABLET | ORAL | Status: DC | PRN

## 2012-08-07 NOTE — ED Notes (Signed)
Returned from CT.

## 2012-08-07 NOTE — ED Notes (Signed)
Pt stated that she "feels sick to my stomach" so I asked if she needed an emesis bag to which she stated, "Non, I already threw up in my car".

## 2012-08-07 NOTE — ED Notes (Signed)
Pt states that she is having abdominal pain, NV today.  Had tooth surgery Monday.  Hx of PID where she was in intensive care.  States it burns when she pees.

## 2012-08-07 NOTE — ED Notes (Addendum)
Patient presents with c/o abdominal pain and nausea. States that she was hospitalized in the ICU at [facility] 3-4 weeks ago for PID. States that she was discharged home on antibiotics, Doxycycline and Metronidazole, but did not take them. States that she has urinary frequency, urgency and dysuria. Also has "clear vaginal discharge with odor" and reports pain with intercourse. States LMP was "2 months ago" but that she has had a tubal ligation. Abdomen tender to palpation in the epigastric and suprapubic areas.

## 2012-08-07 NOTE — ED Provider Notes (Signed)
History     CSN: [id number]  Arrival date & time 08/07/12  1839   First MD Initiated Contact with Patient 08/07/12 2040      Chief Complaint  Patient presents with  . Abdominal Pain    (Consider location/radiation/quality/duration/timing/severity/associated sxs/prior treatment) HPI History provided by pt and prior chart.  Per prior chart, pt admitted to [facility] 8/9-8/14 for recurrent chlamydia and chronic PID.  She returns to the ER today because she is having severe lower abdominal pain that feels the same as her PID.  She started taking her doxycycline again because she had a refill, and for the past two days, she has been vomiting and can't keep it down.  Pain is associated w/ malodorous vaginal discharge, vulvar pruritis, dyspareunia, left flank pain, dysuria and increased urinary frequency.  Has not checked her temperature but has been having hot flashes.  Has h/o IBS but no obvious change in bowels.    Past Medical History  Diagnosis Date  . Hypertension   . Anxiety   . Irritable bowel   . Bipolar 1 disorder   . Apnea, sleep   . PID (acute pelvic inflammatory disease)     Past Surgical History  Procedure Date  . Ovarian cyst removal     History reviewed. No pertinent family history.  History  Substance Use Topics  . Smoking status: Current Every Day Smoker -- 0.0 packs/day    Types: Cigarettes  . Smokeless tobacco: Not on file  . Alcohol Use: No     occasional drinker of all, but not in about a month    OB History    Grav Para Term Preterm Abortions TAB SAB Ect Mult Living   4 4 4       4       Review of Systems  All other systems reviewed and are negative.    Allergies  Bactrim; Naproxen; and Sulfa antibiotics  Home Medications   Current Outpatient Rx  Name Route Sig Dispense Refill  . ASPIRIN 325 MG PO TBEC Oral Take 650 mg by mouth every 4 (four) hours as needed. For pain    . DIPHENHYDRAMINE HCL 25 MG PO CAPS Oral Take 50 mg by mouth  every 6 (six) hours as needed. For allergic reaction    . DOXYCYCLINE HYCLATE 100 MG PO TABS Oral Take 100 mg by mouth 2 (two) times daily.    . IBUPROFEN 600 MG PO TABS Oral Take 600 mg by mouth every 6 (six) hours as needed. For pain    . METRONIDAZOLE 500 MG PO TABS Oral Take 500 mg by mouth 3 (three) times daily.    . OXYCODONE-ACETAMINOPHEN 5-325 MG PO TABS Oral Take 1 tablet by mouth every 4 (four) hours as needed. For pain    . QUETIAPINE FUMARATE 200 MG PO TABS Oral Take 200 mg by mouth at bedtime.    . SERTRALINE HCL 100 MG PO TABS Oral Take 200 mg by mouth every morning.    [name] DOXYCYCLINE HYCLATE 100 MG PO TABS Oral Take 1 tablet (100 mg total) by mouth 2 (two) times daily. 28 tablet 1    Treatment of PID.  [name] IBUPROFEN 600 MG PO TABS Oral Take 600 mg by mouth every 6 (six) hours. For pain    . METRONIDAZOLE 500 MG PO TABS Oral Take 1 tablet (500 mg total) by mouth 3 (three) times daily. 28 tablet 1    Treatment of PID.    BP 114/65  Pulse 75  Temp 98 F (36.7 C) (Oral)  Resp 14  SpO2 100%  LMP 06/10/2012  Physical Exam  Nursing note and vitals reviewed. Constitutional: She is oriented to person, place, and time. She appears well-developed and well-nourished. No distress.  HENT:  Head: Normocephalic and atraumatic.  Eyes:       Normal appearance  Neck: Normal range of motion.  Cardiovascular: Normal rate and regular rhythm.   Pulmonary/Chest: Effort normal and breath sounds normal. No respiratory distress.  Abdominal: Soft. Bowel sounds are normal. She exhibits no distension and no mass. There is no rebound and no guarding.       Diffuse moderate abd tenderness  Genitourinary:       L CVA tenderness.  Nml external genitalia.  Small amt of thick, white vaginal discharge.  Cervix closed and appears nml.  Cervical motion and bilateral adnexal ttp.   Musculoskeletal: Normal range of motion.  Neurological: She is alert and oriented to person, place, and time.  Skin: Skin is  warm and dry. No rash noted.  Psychiatric: She has a normal mood and affect. Her behavior is normal.    ED Course  Procedures (including critical care time)  Labs Reviewed  URINALYSIS, ROUTINE W REFLEX MICROSCOPIC - Abnormal; Notable for the following:    APPearance CLOUDY (*)     All other components within normal limits  CBC WITH DIFFERENTIAL - Abnormal; Notable for the following:    WBC 11.5 (*)     HCT 35.1 (*)     MCV 73.9 (*)     MCH 25.9 (*)     Lymphs Abs 4.8 (*)     All other components within normal limits  COMPREHENSIVE METABOLIC PANEL - Abnormal; Notable for the following:    Potassium 3.4 (*)     Creatinine, Ser 0.46 (*)     Total Bilirubin 0.2 (*)     All other components within normal limits  WET PREP, GENITAL - Abnormal; Notable for the following:    WBC, Wet Prep HPF POC RARE (*)     All other components within normal limits  POCT PREGNANCY, URINE  LIPASE, BLOOD  GC/CHLAMYDIA PROBE AMP, GENITAL   No results found.   1. PID (acute pelvic inflammatory disease)       MDM  26yo F w/ h/o chronic PID and recurrent chlamyida, recently admitted to [facility] for same, presents w/ severe lower abdominal pain w/ several other associated GU sx and N/V; sx consistent w/ past PID infections.  On exam, afebrile, VS w/in nml range, abd diffusely tender, L CVA ttp, small amt of vaginal discharge and nml appearance of cervix, but cervical and bilateral adnexal ttp.   U/A neg for infection.  Wet prep is improved from prior.  Pt received ibuprofen and zofran for sx w/ improvement.  No vomiting in ED and is tolerating pos.  Appears well enough to go home.  She received IM rocephin and po zithromax and d/c'd home w/ doxy, vicodin and phenergan (has relieved her nausea in the past).  Advised close Gyn f/u and return to ER for worsening sx.         Otilio Miu, [place] 08/07/12 2356

## 2012-08-08 LAB — GC/CHLAMYDIA PROBE AMP, GENITAL
Chlamydia, DNA Probe: NEGATIVE
GC Probe Amp, Genital: NEGATIVE

## 2012-08-09 NOTE — ED Provider Notes (Signed)
Medical screening examination/treatment/procedure(s) were performed by non-physician practitioner and as supervising physician I was immediately available for consultation/collaboration.  Santhosh Gulino, MD 08/09/12 1208 

## 2012-09-15 ENCOUNTER — Emergency Department (HOSPITAL_COMMUNITY)
Admission: EM | Admit: 2012-09-15 | Discharge: 2012-09-15 | Disposition: A | Discharge status: Home / Self Care | Payer: Medicaid Other | Attending: Emergency Medicine | Admitting: Emergency Medicine

## 2012-09-15 ENCOUNTER — Encounter (HOSPITAL_COMMUNITY): Payer: Self-pay | Admitting: Emergency Medicine

## 2012-09-15 DIAGNOSIS — F17200 Nicotine dependence, unspecified, uncomplicated: Secondary | ICD-10-CM | POA: Insufficient documentation

## 2012-09-15 DIAGNOSIS — Z79899 Other long term (current) drug therapy: Secondary | ICD-10-CM | POA: Insufficient documentation

## 2012-09-15 DIAGNOSIS — I10 Essential (primary) hypertension: Secondary | ICD-10-CM | POA: Insufficient documentation

## 2012-09-15 DIAGNOSIS — F319 Bipolar disorder, unspecified: Secondary | ICD-10-CM | POA: Insufficient documentation

## 2012-09-15 DIAGNOSIS — G4730 Sleep apnea, unspecified: Secondary | ICD-10-CM | POA: Insufficient documentation

## 2012-09-15 DIAGNOSIS — Z8619 Personal history of other infectious and parasitic diseases: Secondary | ICD-10-CM | POA: Insufficient documentation

## 2012-09-15 DIAGNOSIS — Z8719 Personal history of other diseases of the digestive system: Secondary | ICD-10-CM | POA: Insufficient documentation

## 2012-09-15 DIAGNOSIS — Z113 Encounter for screening for infections with a predominantly sexual mode of transmission: Secondary | ICD-10-CM | POA: Insufficient documentation

## 2012-09-15 DIAGNOSIS — Z711 Person with feared health complaint in whom no diagnosis is made: Secondary | ICD-10-CM

## 2012-09-15 DIAGNOSIS — N730 Acute parametritis and pelvic cellulitis: Secondary | ICD-10-CM

## 2012-09-15 DIAGNOSIS — Z7189 Other specified counseling: Secondary | ICD-10-CM | POA: Insufficient documentation

## 2012-09-15 DIAGNOSIS — F411 Generalized anxiety disorder: Secondary | ICD-10-CM | POA: Insufficient documentation

## 2012-09-15 DIAGNOSIS — N739 Female pelvic inflammatory disease, unspecified: Secondary | ICD-10-CM | POA: Insufficient documentation

## 2012-09-15 DIAGNOSIS — Z3202 Encounter for pregnancy test, result negative: Secondary | ICD-10-CM | POA: Insufficient documentation

## 2012-09-15 DIAGNOSIS — Z7251 High risk heterosexual behavior: Secondary | ICD-10-CM | POA: Insufficient documentation

## 2012-09-15 LAB — CBC WITH DIFFERENTIAL/PLATELET
Basophils Absolute: 0 10*3/uL (ref 0.0–0.1)
Basophils Relative: 0 % (ref 0–1)
Eosinophils Absolute: 0.3 10*3/uL (ref 0.0–0.7)
Eosinophils Relative: 3 % (ref 0–5)
HCT: 35.9 % — ABNORMAL LOW (ref 36.0–46.0)
Hemoglobin: 12.3 g/dL (ref 12.0–15.0)
Lymphocytes Relative: 24 % (ref 12–46)
Lymphs Abs: 2.5 10*3/uL (ref 0.7–4.0)
MCH: 25.7 pg — ABNORMAL LOW (ref 26.0–34.0)
MCHC: 34.3 g/dL (ref 30.0–36.0)
MCV: 75.1 fL — ABNORMAL LOW (ref 78.0–100.0)
Monocytes Absolute: 0.8 10*3/uL (ref 0.1–1.0)
Monocytes Relative: 8 % (ref 3–12)
Neutro Abs: 6.8 10*3/uL (ref 1.7–7.7)
Neutrophils Relative %: 65 % (ref 43–77)
Platelets: 244 10*3/uL (ref 150–400)
RBC: 4.78 MIL/uL (ref 3.87–5.11)
RDW: 13.8 % (ref 11.5–15.5)
WBC: 10.5 10*3/uL (ref 4.0–10.5)

## 2012-09-15 LAB — WET PREP, GENITAL
Trich, Wet Prep: NONE SEEN
WBC, Wet Prep HPF POC: NONE SEEN
Yeast Wet Prep HPF POC: NONE SEEN

## 2012-09-15 LAB — URINALYSIS, ROUTINE W REFLEX MICROSCOPIC
Bilirubin Urine: NEGATIVE
Glucose, UA: NEGATIVE mg/dL
Ketones, ur: NEGATIVE mg/dL
Nitrite: NEGATIVE
Protein, ur: NEGATIVE mg/dL
Specific Gravity, Urine: 1.027 (ref 1.005–1.030)
Urobilinogen, UA: 0.2 mg/dL (ref 0.0–1.0)
pH: 5 (ref 5.0–8.0)

## 2012-09-15 LAB — URINE MICROSCOPIC-ADD ON

## 2012-09-15 LAB — BASIC METABOLIC PANEL
BUN: 11 mg/dL (ref 6–23)
CO2: 27 mEq/L (ref 19–32)
Calcium: 9 mg/dL (ref 8.4–10.5)
Chloride: 108 mEq/L (ref 96–112)
Creatinine, Ser: 0.54 mg/dL (ref 0.50–1.10)
GFR calc Af Amer: >90 mL/min (ref >90)
GFR calc non Af Amer: >90 mL/min (ref >90)
Glucose, Bld: 99 mg/dL (ref 70–99)
Potassium: 3.4 mEq/L — ABNORMAL LOW (ref 3.5–5.1)
Sodium: 143 mEq/L (ref 135–145)

## 2012-09-15 LAB — PREGNANCY, URINE: Preg Test, Ur: NEGATIVE

## 2012-09-15 MED ORDER — ONDANSETRON 4 MG PO TBDP
4.0000 mg | ORAL_TABLET | Freq: Once | ORAL | Status: AC
  Administered 2012-09-15: 4 mg via ORAL
  Filled 2012-09-15: qty 1

## 2012-09-15 MED ORDER — KETOROLAC TROMETHAMINE 30 MG/ML IJ SOLN
30.0000 mg | Freq: Once | INTRAMUSCULAR | Status: AC
  Administered 2012-09-15: 30 mg via INTRAVENOUS
  Filled 2012-09-15: qty 1

## 2012-09-15 MED ORDER — ONDANSETRON HCL 4 MG PO TABS: 4.0000 mg | ORAL_TABLET | Freq: Four times a day (QID) | ORAL | Status: DC

## 2012-09-15 MED ORDER — TRAMADOL HCL 50 MG PO TABS
50.0000 mg | ORAL_TABLET | Freq: Four times a day (QID) | ORAL | Status: DC | PRN
Start: 2012-09-15 — End: 2012-09-15

## 2012-09-15 MED ORDER — IBUPROFEN 800 MG PO TABS
800.0000 mg | ORAL_TABLET | Freq: Once | ORAL | Status: AC
  Administered 2012-09-15: 800 mg via ORAL
  Filled 2012-09-15: qty 1

## 2012-09-15 MED ORDER — TRAMADOL HCL 50 MG PO TABS: 50.0000 mg | ORAL_TABLET | Freq: Four times a day (QID) | ORAL | Status: DC | PRN

## 2012-09-15 MED ORDER — AZITHROMYCIN 250 MG PO TABS: 500.0000 mg | ORAL_TABLET | Freq: Once | ORAL | Status: DC

## 2012-09-15 MED ORDER — AZITHROMYCIN 250 MG PO TABS
1000.0000 mg | ORAL_TABLET | Freq: Once | ORAL | Status: AC
  Administered 2012-09-15: 1000 mg via ORAL
  Filled 2012-09-15: qty 4

## 2012-09-15 MED ORDER — DOXYCYCLINE HYCLATE 100 MG PO CAPS: 100.0000 mg | ORAL_CAPSULE | Freq: Two times a day (BID) | ORAL | Status: DC

## 2012-09-15 MED ORDER — LIDOCAINE HCL (PF) 1 % IJ SOLN
INTRAMUSCULAR | Status: AC
  Filled 2012-09-15: qty 5

## 2012-09-15 MED ORDER — CEFTRIAXONE SODIUM 250 MG IJ SOLR
250.0000 mg | Freq: Once | INTRAMUSCULAR | Status: AC
  Administered 2012-09-15: 250 mg via INTRAMUSCULAR
  Filled 2012-09-15: qty 250

## 2012-09-15 NOTE — ED Notes (Signed)
Pt states she has chlamydia, 10/10 abdominal pain started two days ago. C/o streaks of white, malodorous discharge. Pt A&Ox4, ambulatory.

## 2012-09-15 NOTE — ED Provider Notes (Signed)
Medical screening examination/treatment/procedure(s) were performed by non-physician practitioner and as supervising physician I was immediately available for consultation/collaboration.  Lonnette Shrode, MD 09/15/12 1644 

## 2012-09-15 NOTE — ED Provider Notes (Signed)
History     CSN: [id number]  Arrival date & time 09/15/12  1025   First MD Initiated Contact with Patient 09/15/12 1053      Chief Complaint  Patient presents with  . Abdominal Pain    (Consider location/radiation/quality/duration/timing/severity/associated sxs/prior treatment) HPI    Per prior chart, pt admitted to [facility] 8/9-8/14 for recurrent chlamydia and chronic PID, also seen in the ED for the same on 08/07/2012 for the same and discharged with treatment . She returns to the ER today because she is having severe lower abdominal pain that feels the same as her PID. She has not been vomiting and has had subjective hot flashes. Pain is associated w/ malodorous vaginal discharge, vulvar pruritis, dyspareunia, left flank pain, dysuria and increased urinary frequency. Has not checked her temperature but has been having hot flashes. Has h/o IBS but no obvious change in bowels. She says that she continues to sleep with her ex boyfriend who "apparently is not getting treated for chlamydia like he says he is. That, or we arent waiting long enough after hes getting treated.".   Past Medical History  Diagnosis Date  . Hypertension   . Anxiety   . Irritable bowel   . Bipolar 1 disorder   . Apnea, sleep   . PID (acute pelvic inflammatory disease)     Past Surgical History  Procedure Date  . Ovarian cyst removal     History reviewed. No pertinent family history.  History  Substance Use Topics  . Smoking status: Current Every Day Smoker -- 0.0 packs/day    Types: Cigarettes  . Smokeless tobacco: Not on file  . Alcohol Use: Yes     Comment: occasional drinker of all, but not in about a month    OB History    Grav Para Term Preterm Abortions TAB SAB Ect Mult Living   4 4 4       4       Review of Systems   Review of Systems  Gen: no weight loss, fevers, chills, night sweats  Eyes: no discharge or drainage, no occular pain or visual changes  Nose: no epistaxis or  rhinorrhea  Mouth: no dental pain, no sore throat  Neck: no neck pain  Lungs:No wheezing, coughing or hemoptysis CV: no chest pain, palpitations, dependent edema or orthopnea  Abd: + low abdominal pain, no nausea, vomiting  GU: no dysuria or gross hematuria + vaginal discharge and foul smell of the vagina MSK:  No abnormalities  Neuro: no headache, no focal neurologic deficits  Skin: no abnormalities Psyche: negative.    Allergies  Bactrim; Flagyl; Naproxen; and Sulfa antibiotics  Home Medications   Current Outpatient Rx  Name  Route  Sig  Dispense  Refill  . QUETIAPINE FUMARATE 200 MG PO TABS   Oral   Take 200 mg by mouth at bedtime.         . SERTRALINE HCL 100 MG PO TABS   Oral   Take 200 mg by mouth every morning.         [name] DOXYCYCLINE HYCLATE 100 MG PO CAPS   Oral   Take 1 capsule (100 mg total) by mouth 2 (two) times daily.   20 capsule   0   . IBUPROFEN 600 MG PO TABS   Oral   Take 600 mg by mouth every 6 (six) hours. For pain         . METRONIDAZOLE 500 MG PO TABS   Oral  Take 1 tablet (500 mg total) by mouth 3 (three) times daily.   28 tablet   1     Treatment of PID.   [name] ONDANSETRON HCL 4 MG PO TABS   Oral   Take 1 tablet (4 mg total) by mouth every 6 (six) hours.   12 tablet   0   . TRAMADOL HCL 50 MG PO TABS   Oral   Take 1 tablet (50 mg total) by mouth every 6 (six) hours as needed for pain.   15 tablet   0     BP 127/78  Pulse 89  Temp 98.5 F (36.9 C) (Oral)  Resp 20  SpO2 100%  Physical Exam  Nursing note and vitals reviewed. Constitutional: She appears well-developed and well-nourished. No distress.  HENT:  Head: Normocephalic and atraumatic.  Eyes: Pupils are equal, round, and reactive to light.  Neck: Normal range of motion. Neck supple.  Cardiovascular: Normal rate and regular rhythm.   Pulmonary/Chest: Effort normal.  Abdominal: Soft. There is tenderness (suprapubic tenderness).  Genitourinary: Uterus normal.  Cervix exhibits discharge. Cervix exhibits no motion tenderness and no friability. There is tenderness around the vagina. No erythema or bleeding around the vagina. No foreign body around the vagina. No signs of injury around the vagina. Vaginal discharge found.  Neurological: She is alert.  Skin: Skin is warm and dry.    ED Course  Procedures (including critical care time)  Labs Reviewed  CBC WITH DIFFERENTIAL - Abnormal; Notable for the following:    HCT 35.9 (*)     MCV 75.1 (*)     MCH 25.7 (*)     All other components within normal limits  BASIC METABOLIC PANEL - Abnormal; Notable for the following:    Potassium 3.4 (*)     All other components within normal limits  URINALYSIS, ROUTINE W REFLEX MICROSCOPIC - Abnormal; Notable for the following:    APPearance CLOUDY (*)     Hgb urine dipstick MODERATE (*)     Leukocytes, UA SMALL (*)     All other components within normal limits  WET PREP, GENITAL - Abnormal; Notable for the following:    Clue Cells Wet Prep HPF POC FEW (*)     All other components within normal limits  URINE MICROSCOPIC-ADD ON - Abnormal; Notable for the following:    Squamous Epithelial / LPF FEW (*)     Crystals URIC ACID CRYSTALS (*)     All other components within normal limits  PREGNANCY, URINE  GC/CHLAMYDIA PROBE AMP, GENITAL   No results found.   1. Concern about STD in female without diagnosis   2. PID (acute pelvic inflammatory disease)       MDM  Pts vital signs are stable, she has no fevers she is not tachycardic. She has not been vomiting. She is given pain medication emergency department her pain began to resolve. She's been given IM Rocephin 250mg  and 1 gram azithromycin in the emergency department. Due to her suprapubic tenderness she has been given Doxycycline, Tramadol and Zofran. Pt has sent home before on these medications, 1 month ago and did well with no complications.  Sex education given.  Pt has been advised of the symptoms  that warrant their return to the ED. Patient has voiced understanding and has agreed to follow-up with the PCP or specialist.         Dorthula Matas, PA 09/15/12 1219

## 2012-10-16 ENCOUNTER — Encounter (HOSPITAL_COMMUNITY): Payer: Self-pay | Admitting: *Deleted

## 2012-10-16 ENCOUNTER — Emergency Department (HOSPITAL_COMMUNITY)
Admission: EM | Admit: 2012-10-16 | Discharge: 2012-10-17 | Disposition: A | Discharge status: Home / Self Care | Payer: Medicaid Other | Attending: Emergency Medicine | Admitting: Emergency Medicine

## 2012-10-16 DIAGNOSIS — G4730 Sleep apnea, unspecified: Secondary | ICD-10-CM | POA: Insufficient documentation

## 2012-10-16 DIAGNOSIS — I10 Essential (primary) hypertension: Secondary | ICD-10-CM | POA: Insufficient documentation

## 2012-10-16 DIAGNOSIS — R209 Unspecified disturbances of skin sensation: Secondary | ICD-10-CM | POA: Insufficient documentation

## 2012-10-16 DIAGNOSIS — F411 Generalized anxiety disorder: Secondary | ICD-10-CM | POA: Insufficient documentation

## 2012-10-16 DIAGNOSIS — N739 Female pelvic inflammatory disease, unspecified: Secondary | ICD-10-CM | POA: Insufficient documentation

## 2012-10-16 DIAGNOSIS — F17200 Nicotine dependence, unspecified, uncomplicated: Secondary | ICD-10-CM | POA: Insufficient documentation

## 2012-10-16 DIAGNOSIS — M5430 Sciatica, unspecified side: Secondary | ICD-10-CM | POA: Insufficient documentation

## 2012-10-16 DIAGNOSIS — F319 Bipolar disorder, unspecified: Secondary | ICD-10-CM | POA: Insufficient documentation

## 2012-10-16 DIAGNOSIS — Z79899 Other long term (current) drug therapy: Secondary | ICD-10-CM | POA: Insufficient documentation

## 2012-10-16 LAB — CBC WITH DIFFERENTIAL/PLATELET
Basophils Absolute: 0 10*3/uL (ref 0.0–0.1)
Basophils Relative: 0 % (ref 0–1)
Eosinophils Absolute: 0.4 10*3/uL (ref 0.0–0.7)
Eosinophils Relative: 4 % (ref 0–5)
HCT: 35.8 % — ABNORMAL LOW (ref 36.0–46.0)
Hemoglobin: 12.4 g/dL (ref 12.0–15.0)
Lymphocytes Relative: 33 % (ref 12–46)
Lymphs Abs: 3.5 10*3/uL (ref 0.7–4.0)
MCH: 26.2 pg (ref 26.0–34.0)
MCHC: 34.6 g/dL (ref 30.0–36.0)
MCV: 75.5 fL — ABNORMAL LOW (ref 78.0–100.0)
Monocytes Absolute: 0.8 10*3/uL (ref 0.1–1.0)
Monocytes Relative: 8 % (ref 3–12)
Neutro Abs: 5.9 10*3/uL (ref 1.7–7.7)
Neutrophils Relative %: 55 % (ref 43–77)
Platelets: 244 10*3/uL (ref 150–400)
RBC: 4.74 MIL/uL (ref 3.87–5.11)
RDW: 14 % (ref 11.5–15.5)
WBC: 10.6 10*3/uL — ABNORMAL HIGH (ref 4.0–10.5)

## 2012-10-16 LAB — COMPREHENSIVE METABOLIC PANEL
ALT: 11 U/L (ref 0–35)
AST: 11 U/L (ref 0–37)
Albumin: 3.6 g/dL (ref 3.5–5.2)
Alkaline Phosphatase: 80 U/L (ref 39–117)
BUN: 12 mg/dL (ref 6–23)
CO2: 25 mEq/L (ref 19–32)
Calcium: 9 mg/dL (ref 8.4–10.5)
Chloride: 107 mEq/L (ref 96–112)
Creatinine, Ser: 0.6 mg/dL (ref 0.50–1.10)
GFR calc Af Amer: >90 mL/min (ref >90)
GFR calc non Af Amer: >90 mL/min (ref >90)
Glucose, Bld: 111 mg/dL — ABNORMAL HIGH (ref 70–99)
Potassium: 4 mEq/L (ref 3.5–5.1)
Sodium: 144 mEq/L (ref 135–145)
Total Bilirubin: 0.2 mg/dL — ABNORMAL LOW (ref 0.3–1.2)
Total Protein: 6.7 g/dL (ref 6.0–8.3)

## 2012-10-16 LAB — D-DIMER, QUANTITATIVE: D-Dimer, Quant: 0.41 ug/mL-FEU (ref 0.00–0.48)

## 2012-10-16 NOTE — ED Notes (Addendum)
Pt drove from [place] to [place] then back to [employer] (approx. 4 hours) then having left leg pain. Pt also anxious on ems arrival. No swelling, deformity or injury.

## 2012-10-16 NOTE — ED Provider Notes (Signed)
History     CSN: [id number]  Arrival date & time 10/16/12  2242   None     Chief Complaint  Patient presents with  . Leg Pain    left    (Consider location/radiation/quality/duration/timing/severity/associated sxs/prior treatment) HPI History provided by pt.   Pt presents w/ c/o severe, diffuse, burning LLE pain w/ associated paresthesias since approx 6pm today.  Pain aggravated by bearing weight.  No associated fever, LE edema/skin changes/weakness or bowel/bladder dysfunction.  Took percocet and ibuprofen w/out relief.  Denies trauma.  H/o "pinched nerve" but that pain normally affects posterior thigh only.     Past Medical History  Diagnosis Date  . Hypertension   . Anxiety   . Irritable bowel   . Bipolar 1 disorder   . Apnea, sleep   . PID (acute pelvic inflammatory disease)   . PID (pelvic inflammatory disease)   . Back ache     Past Surgical History  Procedure Date  . Ovarian cyst removal     History reviewed. No pertinent family history.  History  Substance Use Topics  . Smoking status: Current Every Day Smoker -- 0.0 packs/day    Types: Cigarettes  . Smokeless tobacco: Not on file  . Alcohol Use: Yes     Comment: occasional drinker of all, but not in about a month    OB History    Grav Para Term Preterm Abortions TAB SAB Ect Mult Living   4 4 4       4       Review of Systems  All other systems reviewed and are negative.    Allergies  Bactrim; Flagyl; Naproxen; and Sulfa antibiotics  Home Medications   Current Outpatient Rx  Name  Route  Sig  Dispense  Refill  . DOXYCYCLINE HYCLATE 100 MG PO CAPS   Oral   Take 1 capsule (100 mg total) by mouth 2 (two) times daily.   20 capsule   0   . OXYCODONE-ACETAMINOPHEN 10-325 MG PO TABS   Oral   Take 1 tablet by mouth every 4 (four) hours as needed. pain         . IBUPROFEN 600 MG PO TABS   Oral   Take 600 mg by mouth every 6 (six) hours. For pain         . METRONIDAZOLE 500 MG PO TABS   Oral   Take 1 tablet (500 mg total) by mouth 3 (three) times daily.   28 tablet   1     Treatment of PID.     BP 140/82  Pulse 74  Temp 98.1 F (36.7 C) (Oral)  Resp 20  SpO2 99%  Physical Exam  Nursing note and vitals reviewed. Constitutional: She is oriented to person, place, and time. She appears well-developed and well-nourished. No distress.  HENT:  Head: Normocephalic and atraumatic.  Eyes:       Normal appearance  Neck: Normal range of motion.  Pulmonary/Chest: Effort normal.  Musculoskeletal: Normal range of motion.       Mild tenderness lower thoracic as well as entire lumbar spine. No deformity, edema or skin changes of LLE.  Tenderness left anterior hip and left anterior knee.  Pain w/ passive knee flexion but not with ROM of hip or ankle.  2+ DP pulse and distal sensation intact.    Neurological: She is alert and oriented to person, place, and time.  Psychiatric: She has a normal mood and affect. Her behavior is normal.  ED Course  Procedures (including critical care time)  Labs Reviewed  CBC WITH DIFFERENTIAL - Abnormal; Notable for the following:    WBC 10.6 (*)     HCT 35.8 (*)     MCV 75.5 (*)     All other components within normal limits  COMPREHENSIVE METABOLIC PANEL - Abnormal; Notable for the following:    Glucose, Bld 111 (*)     Total Bilirubin 0.2 (*)     All other components within normal limits  D-DIMER, QUANTITATIVE   No results found.   1. Sciatica       MDM  Immunocompetent 26yo F w/ h/o L-sided sciatica, presents w/ non-traumatic, diffuse burning pain and paresthesias of LLE.  On exam, afebrile, well-appearing, no signs of DVT nor joint infection, NV intact.  Suspect exacerbation of sciatica.  Pt treated w/ IM dilaudid and toradol in ED and reports that her pain is much improved.  She is ambulating w/out difficulty.  She has 10mg  percocet and 600mg  ibuprofen at home.  Return precautions discussed.          Otilio Miu, PA-C 10/17/12 [phone]

## 2012-10-16 NOTE — ED Notes (Signed)
Pt reports known PID infection that her MD has her on abx for multiple weeks; pt states that she also has hx of pinched nerve, and drove to [place] and back today taking her a total of 4 hours; pt reports having L lower back pain that radiates down L leg and having numbness today after trip; pt unsure if d/t PID infection or pinched nerve

## 2012-10-17 MED ORDER — HYDROMORPHONE HCL PF 1 MG/ML IJ SOLN
1.0000 mg | Freq: Once | INTRAMUSCULAR | Status: AC
  Administered 2012-10-17: 1 mg via INTRAMUSCULAR
  Filled 2012-10-17: qty 1

## 2012-10-17 MED ORDER — KETOROLAC TROMETHAMINE 60 MG/2ML IM SOLN
60.0000 mg | Freq: Once | INTRAMUSCULAR | Status: AC
  Administered 2012-10-17: 60 mg via INTRAMUSCULAR
  Filled 2012-10-17: qty 2

## 2012-10-17 NOTE — ED Provider Notes (Signed)
Medical screening examination/treatment/procedure(s) were performed by non-physician practitioner and as supervising physician I was immediately available for consultation/collaboration.  Terrina Docter M Brooks Kinnan, MD 10/17/12 0715 

## 2012-11-21 ENCOUNTER — Encounter (HOSPITAL_COMMUNITY): Payer: Self-pay | Admitting: Emergency Medicine

## 2012-11-21 ENCOUNTER — Emergency Department (HOSPITAL_COMMUNITY)
Admission: EM | Admit: 2012-11-21 | Discharge: 2012-11-21 | Disposition: A | Discharge status: Home / Self Care | Payer: Medicaid Other | Attending: Emergency Medicine | Admitting: Emergency Medicine

## 2012-11-21 ENCOUNTER — Emergency Department (HOSPITAL_COMMUNITY): Payer: Medicaid Other

## 2012-11-21 DIAGNOSIS — R51 Headache: Secondary | ICD-10-CM | POA: Insufficient documentation

## 2012-11-21 DIAGNOSIS — N730 Acute parametritis and pelvic cellulitis: Secondary | ICD-10-CM

## 2012-11-21 DIAGNOSIS — F17200 Nicotine dependence, unspecified, uncomplicated: Secondary | ICD-10-CM | POA: Insufficient documentation

## 2012-11-21 DIAGNOSIS — I10 Essential (primary) hypertension: Secondary | ICD-10-CM | POA: Insufficient documentation

## 2012-11-21 DIAGNOSIS — Z8739 Personal history of other diseases of the musculoskeletal system and connective tissue: Secondary | ICD-10-CM | POA: Insufficient documentation

## 2012-11-21 DIAGNOSIS — R110 Nausea: Secondary | ICD-10-CM | POA: Insufficient documentation

## 2012-11-21 DIAGNOSIS — Z3202 Encounter for pregnancy test, result negative: Secondary | ICD-10-CM | POA: Insufficient documentation

## 2012-11-21 DIAGNOSIS — Z9889 Other specified postprocedural states: Secondary | ICD-10-CM | POA: Insufficient documentation

## 2012-11-21 DIAGNOSIS — R109 Unspecified abdominal pain: Secondary | ICD-10-CM | POA: Insufficient documentation

## 2012-11-21 DIAGNOSIS — N739 Female pelvic inflammatory disease, unspecified: Secondary | ICD-10-CM | POA: Insufficient documentation

## 2012-11-21 DIAGNOSIS — Z8659 Personal history of other mental and behavioral disorders: Secondary | ICD-10-CM | POA: Insufficient documentation

## 2012-11-21 DIAGNOSIS — Z8719 Personal history of other diseases of the digestive system: Secondary | ICD-10-CM | POA: Insufficient documentation

## 2012-11-21 LAB — COMPREHENSIVE METABOLIC PANEL
ALT: 10 U/L (ref 0–35)
AST: 11 U/L (ref 0–37)
Albumin: 3.8 g/dL (ref 3.5–5.2)
Alkaline Phosphatase: 89 U/L (ref 39–117)
BUN: 8 mg/dL (ref 6–23)
CO2: 24 mEq/L (ref 19–32)
Calcium: 9.2 mg/dL (ref 8.4–10.5)
Chloride: 104 mEq/L (ref 96–112)
Creatinine, Ser: 0.57 mg/dL (ref 0.50–1.10)
GFR calc Af Amer: >90 mL/min (ref >90)
GFR calc non Af Amer: >90 mL/min (ref >90)
Glucose, Bld: 105 mg/dL — ABNORMAL HIGH (ref 70–99)
Potassium: 3.9 mEq/L (ref 3.5–5.1)
Sodium: 141 mEq/L (ref 135–145)
Total Bilirubin: 0.3 mg/dL (ref 0.3–1.2)
Total Protein: 7.2 g/dL (ref 6.0–8.3)

## 2012-11-21 LAB — URINE MICROSCOPIC-ADD ON

## 2012-11-21 LAB — CBC WITH DIFFERENTIAL/PLATELET
Basophils Absolute: 0.1 10*3/uL (ref 0.0–0.1)
Basophils Relative: 1 % (ref 0–1)
Eosinophils Absolute: 0.3 10*3/uL (ref 0.0–0.7)
Eosinophils Relative: 4 % (ref 0–5)
HCT: 39 % (ref 36.0–46.0)
Hemoglobin: 13.7 g/dL (ref 12.0–15.0)
Lymphocytes Relative: 37 % (ref 12–46)
Lymphs Abs: 3.2 10*3/uL (ref 0.7–4.0)
MCH: 26 pg (ref 26.0–34.0)
MCHC: 35.1 g/dL (ref 30.0–36.0)
MCV: 74.1 fL — ABNORMAL LOW (ref 78.0–100.0)
Monocytes Absolute: 0.7 10*3/uL (ref 0.1–1.0)
Monocytes Relative: 8 % (ref 3–12)
Neutro Abs: 4.3 10*3/uL (ref 1.7–7.7)
Neutrophils Relative %: 50 % (ref 43–77)
Platelets: 214 10*3/uL (ref 150–400)
RBC: 5.26 MIL/uL — ABNORMAL HIGH (ref 3.87–5.11)
RDW: 13.7 % (ref 11.5–15.5)
WBC: 8.6 10*3/uL (ref 4.0–10.5)

## 2012-11-21 LAB — WET PREP, GENITAL
Trich, Wet Prep: NONE SEEN
Yeast Wet Prep HPF POC: NONE SEEN

## 2012-11-21 LAB — URINALYSIS, ROUTINE W REFLEX MICROSCOPIC
Bilirubin Urine: NEGATIVE
Glucose, UA: NEGATIVE mg/dL
Ketones, ur: NEGATIVE mg/dL
Leukocytes, UA: NEGATIVE
Nitrite: NEGATIVE
Protein, ur: NEGATIVE mg/dL
Specific Gravity, Urine: 1.025 (ref 1.005–1.030)
Urobilinogen, UA: 1 mg/dL (ref 0.0–1.0)
pH: 7.5 (ref 5.0–8.0)

## 2012-11-21 LAB — POCT PREGNANCY, URINE: Preg Test, Ur: NEGATIVE

## 2012-11-21 MED ORDER — OXYCODONE-ACETAMINOPHEN 5-325 MG PO TABS
2.0000 | ORAL_TABLET | Freq: Once | ORAL | Status: AC
  Administered 2012-11-21: 2 via ORAL
  Filled 2012-11-21: qty 2

## 2012-11-21 MED ORDER — LIDOCAINE HCL (PF) 1 % IJ SOLN
INTRAMUSCULAR | Status: AC
  Administered 2012-11-21: 5 mL
  Filled 2012-11-21: qty 5

## 2012-11-21 MED ORDER — MORPHINE SULFATE 4 MG/ML IJ SOLN
4.0000 mg | Freq: Once | INTRAMUSCULAR | Status: AC
  Administered 2012-11-21: 4 mg via INTRAVENOUS
  Filled 2012-11-21: qty 1

## 2012-11-21 MED ORDER — CEFTRIAXONE SODIUM 250 MG IJ SOLR
250.0000 mg | Freq: Once | INTRAMUSCULAR | Status: AC
  Administered 2012-11-21: 250 mg via INTRAMUSCULAR
  Filled 2012-11-21: qty 250

## 2012-11-21 MED ORDER — METRONIDAZOLE 0.75 % VA GEL: 1.0000 | Freq: Every day | VAGINAL | Status: DC

## 2012-11-21 MED ORDER — ONDANSETRON HCL 4 MG/2ML IJ SOLN
4.0000 mg | Freq: Once | INTRAMUSCULAR | Status: AC
  Administered 2012-11-21: 4 mg via INTRAVENOUS
  Filled 2012-11-21: qty 2

## 2012-11-21 MED ORDER — DOXYCYCLINE HYCLATE 100 MG PO CAPS: 100.0000 mg | ORAL_CAPSULE | Freq: Two times a day (BID) | ORAL | Status: DC

## 2012-11-21 MED ORDER — ONDANSETRON 4 MG PO TBDP: ORAL_TABLET | ORAL | Status: DC

## 2012-11-21 MED ORDER — OXYCODONE-ACETAMINOPHEN 5-325 MG PO TABS: 2.0000 | ORAL_TABLET | ORAL | Status: DC | PRN

## 2012-11-21 NOTE — ED Notes (Signed)
Pt c/o lower abd pain worse on right side with radiation into right leg x 2 days; pt sts LMP was end of November

## 2012-11-21 NOTE — ED Provider Notes (Signed)
History     CSN: [id number]  Arrival date & time 11/21/12  1408   First MD Initiated Contact with Patient 11/21/12 1510      Chief Complaint  Patient presents with  . Abdominal Pain    (Consider location/radiation/quality/duration/timing/severity/associated sxs/prior treatment) Patient is a 27 y.o. female presenting with abdominal pain. The history is provided by the patient. No language interpreter was used.  Abdominal Pain The primary symptoms of the illness include abdominal pain and nausea. The primary symptoms of the illness do not include fever, shortness of breath, vomiting, diarrhea or dysuria. The current episode started 3 to 5 hours ago. The onset of the illness was sudden. The problem has not changed since onset. The abdominal pain is located in the RLQ. The abdominal pain radiates to the right leg. The severity of the abdominal pain is 8/10. The abdominal pain is relieved by nothing.  The patient states that she believes she is currently not pregnant. The patient has not had a change in bowel habit. Symptoms associated with the illness do not include chills, constipation or frequency.    Past Medical History  Diagnosis Date  . Hypertension   . Anxiety   . Irritable bowel   . Bipolar 1 disorder   . Apnea, sleep   . PID (acute pelvic inflammatory disease)   . PID (pelvic inflammatory disease)   . Back ache     Past Surgical History  Procedure Date  . Ovarian cyst removal     History reviewed. No pertinent family history.  History  Substance Use Topics  . Smoking status: Current Every Day Smoker -- 0.0 packs/day    Types: Cigarettes  . Smokeless tobacco: Not on file  . Alcohol Use: Yes     Comment: occasional drinker of all, but not in about a month    OB History    Grav Para Term Preterm Abortions TAB SAB Ect Mult Living   4 4 4       4       Review of Systems  Constitutional: Negative for fever and chills.  HENT: Negative for congestion and sore  throat.   Respiratory: Negative for cough and shortness of breath.   Cardiovascular: Negative for chest pain and leg swelling.  Gastrointestinal: Positive for nausea and abdominal pain. Negative for vomiting, diarrhea and constipation.  Genitourinary: Negative for dysuria and frequency.  Skin: Negative for color change and rash.  Neurological: Positive for headaches. Negative for dizziness.  Psychiatric/Behavioral: Negative for confusion and agitation.  All other systems reviewed and are negative.    Allergies  Bactrim; Flagyl; Naproxen; and Sulfa antibiotics  Home Medications   Current Outpatient Rx  Name  Route  Sig  Dispense  Refill  . DOXYCYCLINE HYCLATE 100 MG PO CAPS   Oral   Take 1 capsule (100 mg total) by mouth 2 (two) times daily.   20 capsule   0   . IBUPROFEN 600 MG PO TABS   Oral   Take 600 mg by mouth every 6 (six) hours. For pain         . METRONIDAZOLE 500 MG PO TABS   Oral   Take 1 tablet (500 mg total) by mouth 3 (three) times daily.   28 tablet   1     Treatment of PID.   [name] OXYCODONE-ACETAMINOPHEN 10-325 MG PO TABS   Oral   Take 1 tablet by mouth every 4 (four) hours as needed. pain  BP 125/74  Pulse 79  Temp 97.6 F (36.4 C)  SpO2 95%  Physical Exam  Vitals reviewed. Constitutional: She is oriented to person, place, and time. She appears well-developed and well-nourished. No distress.  HENT:  Head: Normocephalic and atraumatic.  Eyes: EOM are normal. Pupils are equal, round, and reactive to light.  Neck: Normal range of motion. Neck supple.  Cardiovascular: Normal rate and regular rhythm.   Pulmonary/Chest: Effort normal. No respiratory distress.  Abdominal: Soft. She exhibits no distension. There is no hepatosplenomegaly, splenomegaly or hepatomegaly. There is tenderness in the right lower quadrant. There is guarding. There is no rigidity, no CVA tenderness, no tenderness at McBurney's point and negative Murphy's sign.    Genitourinary: Uterus normal. Cervix exhibits motion tenderness and discharge. Right adnexum displays tenderness.  Musculoskeletal: Normal range of motion. She exhibits no edema.  Neurological: She is alert and oriented to person, place, and time.  Skin: Skin is warm and dry.  Psychiatric: She has a normal mood and affect. Her behavior is normal.    ED Course  Procedures (including critical care time)  Labs Reviewed  CBC WITH DIFFERENTIAL - Abnormal; Notable for the following:    RBC 5.26 (*)     MCV 74.1 (*)     All other components within normal limits  POCT PREGNANCY, URINE  URINALYSIS, ROUTINE W REFLEX MICROSCOPIC  COMPREHENSIVE METABOLIC PANEL  GC/CHLAMYDIA PROBE AMP  WET PREP, GENITAL   No results found.   No diagnosis found. Results for orders placed during the hospital encounter of 11/21/12  URINALYSIS, ROUTINE W REFLEX MICROSCOPIC      Component Value Range   Color, Urine YELLOW  YELLOW   APPearance CLOUDY (*) CLEAR   Specific Gravity, Urine 1.025  1.005 - 1.030   pH 7.5  5.0 - 8.0   Glucose, UA NEGATIVE  NEGATIVE mg/dL   Hgb urine dipstick LARGE (*) NEGATIVE   Bilirubin Urine NEGATIVE  NEGATIVE   Ketones, ur NEGATIVE  NEGATIVE mg/dL   Protein, ur NEGATIVE  NEGATIVE mg/dL   Urobilinogen, UA 1.0  0.0 - 1.0 mg/dL   Nitrite NEGATIVE  NEGATIVE   Leukocytes, UA NEGATIVE  NEGATIVE  CBC WITH DIFFERENTIAL      Component Value Range   WBC 8.6  4.0 - 10.5 K/uL   RBC 5.26 (*) 3.87 - 5.11 MIL/uL   Hemoglobin 13.7  12.0 - 15.0 g/dL   HCT 45.4  09.8 - 11.9 %   MCV 74.1 (*) 78.0 - 100.0 fL   MCH 26.0  26.0 - 34.0 pg   MCHC 35.1  30.0 - 36.0 g/dL   RDW 14.7  82.9 - 56.2 %   Platelets 214  150 - 400 K/uL   Neutrophils Relative 50  43 - 77 %   Lymphocytes Relative 37  12 - 46 %   Monocytes Relative 8  3 - 12 %   Eosinophils Relative 4  0 - 5 %   Basophils Relative 1  0 - 1 %   Neutro Abs 4.3  1.7 - 7.7 K/uL   Lymphs Abs 3.2  0.7 - 4.0 K/uL   Monocytes Absolute 0.7   0.1 - 1.0 K/uL   Eosinophils Absolute 0.3  0.0 - 0.7 K/uL   Basophils Absolute 0.1  0.0 - 0.1 K/uL   RBC Morphology TARGET CELLS     Smear Review LARGE PLATELETS PRESENT    COMPREHENSIVE METABOLIC PANEL      Component Value Range   Sodium  141  135 - 145 mEq/L   Potassium 3.9  3.5 - 5.1 mEq/L   Chloride 104  96 - 112 mEq/L   CO2 24  19 - 32 mEq/L   Glucose, Bld 105 (*) 70 - 99 mg/dL   BUN 8  6 - 23 mg/dL   Creatinine, Ser 9.14  0.50 - 1.10 mg/dL   Calcium 9.2  8.4 - 78.2 mg/dL   Total Protein 7.2  6.0 - 8.3 g/dL   Albumin 3.8  3.5 - 5.2 g/dL   AST 11  0 - 37 U/L   ALT 10  0 - 35 U/L   Alkaline Phosphatase 89  39 - 117 U/L   Total Bilirubin 0.3  0.3 - 1.2 mg/dL   GFR calc non Af Amer >90  >90 mL/min   GFR calc Af Amer >90  >90 mL/min  WET PREP, GENITAL      Component Value Range   Yeast Wet Prep HPF POC NONE SEEN  NONE SEEN   Trich, Wet Prep NONE SEEN  NONE SEEN   Clue Cells Wet Prep HPF POC FEW (*) NONE SEEN   WBC, Wet Prep HPF POC FEW (*) NONE SEEN  POCT PREGNANCY, URINE      Component Value Range   Preg Test, Ur NEGATIVE  NEGATIVE  URINE MICROSCOPIC-ADD ON      Component Value Range   Squamous Epithelial / LPF MANY (*) RARE   WBC, UA 0-2  <3 WBC/hpf   RBC / HPF 3-6  <3 RBC/hpf   Bacteria, UA MANY (*) RARE   US Transvaginal Non-OB (Final result)   Result time:11/21/12 1629    Final result by Rad Results In Interface (11/21/12 16:29:17)    Narrative:   *RADIOLOGY REPORT*  Clinical Data: Right pelvic pain, history of prior ovarian cyst resections, hypertension, PID, tubal ligation  TRANSABDOMINAL AND TRANSVAGINAL ULTRASOUND OF PELVIS Technique: Both transabdominal and transvaginal ultrasound examinations of the pelvis were performed. Transabdominal technique was performed for global imaging of the pelvis including uterus, ovaries, adnexal regions, and pelvic cul-de-sac.  It was necessary to proceed with endovaginal exam following the transabdominal exam to  visualize the ovaries.  Comparison: 06/21/2012  Findings:  Uterus: 10.6 cm length by 5.4 cm AP by 6.7 cm transverse. Nabothian cyst at cervix. No additional uterine mass.  Endometrium: 10 mm thick, upper normal. No endometrial fluid.  Right ovary: 3.6 x 2.4 x 1.8 cm. Normal morphology without mass.  Left ovary: 3.3 x 2.0 x 1.6 cm. Normal morphology without mass.  Other findings: No free fluidor adnexal masses.  IMPRESSION: Normal study. No evidence of pelvic mass or other significant abnormality.   Original Report Authenticated By: Ulyses Southward, M.D.             US Pelvis Complete (Final result)   Result time:11/21/12 1629    Final result by Rad Results In Interface (11/21/12 16:29:16)    Narrative:   *RADIOLOGY REPORT*  Clinical Data: Right pelvic pain, history of prior ovarian cyst resections, hypertension, PID, tubal ligation  TRANSABDOMINAL AND TRANSVAGINAL ULTRASOUND OF PELVIS Technique: Both transabdominal and transvaginal ultrasound examinations of the pelvis were performed. Transabdominal technique was performed for global imaging of the pelvis including uterus, ovaries, adnexal regions, and pelvic cul-de-sac.  It was necessary to proceed with endovaginal exam following the transabdominal exam to visualize the ovaries.  Comparison: 06/21/2012  Findings:  Uterus: 10.6 cm length by 5.4 cm AP by 6.7 cm transverse. Nabothian cyst at cervix.  No additional uterine mass.  Endometrium: 10 mm thick, upper normal. No endometrial fluid.  Right ovary: 3.6 x 2.4 x 1.8 cm. Normal morphology without mass.  Left ovary: 3.3 x 2.0 x 1.6 cm. Normal morphology without mass.  Other findings: No free fluidor adnexal masses.  IMPRESSION: Normal study. No evidence of pelvic mass or other significant abnormality.   Original Report Authenticated By: Ulyses Southward, M.D.           MDM  Pt w/ PMHx of PID, TOA, ovarian cysts, prior tubal ligation presents w/ RLQ  abdominal pain. States 1 wk hx of intermittent HA and nausea. Today acute onset severe RLQ pain - started 3 hrs PTA, severe 9/10, sharp, radiating to ant thigh. A/w nausea, dysuria, polyuria, vaginal odor change and irritation. Denies vaginal bleeding, fever, emesis, diarrhea or constipation. Pt still has her appendix. LMP november  Exam: afebrile, NAD, normotensive, not tachycardic, ttp RLQ and right adnexa, + CMT w/ blood d/c. CVA - nttp, no rebound, + voluntary guarding, neg murphy.  Ddx/plan: concern for PID/TOA, possible torsion or appendicitis, ectopic. Doubt: SBO, femoral hernia. Will check hcg, u/a, cbc, cmp and perform pelvic, will give morphine and zofran.   Course: reassessed, vitals stable, NAD, pain well controlled, pelvic exam concerning for PID - w/ + CMT and right adnexal pain. [place] ordered and neg for TOA, cyst or torsion. Pt not ttp at [facility] and no leukocytosis - doubt appendicitis at this time. Wet prep: + clue cells, u/a neg for infection, hcg neg. At this time likely PID - pt non toxic no indication to admit. tx w/ doxy x 14 days and rocephin 250mg  IM and flagyl. D/c home in good and stable condition. Given return precautions and follow up instructions.   1. Abdominal  pain, other specified site   2. PID (acute pelvic inflammatory disease)    New Prescriptions   DOXYCYCLINE (VIBRAMYCIN) 100 MG CAPSULE    Take 1 capsule (100 mg total) by mouth 2 (two) times daily. One po bid x 14 days   METRONIDAZOLE (METROGEL) 0.75 % VAGINAL GEL    Place 1 Applicatorful vaginally daily. For 5 days   ONDANSETRON (ZOFRAN ODT) 4 MG DISINTEGRATING TABLET    4mg  ODT q4 hours prn nausea/vomit   OXYCODONE-ACETAMINOPHEN (PERCOCET) 5-325 MG PER TABLET    Take 2 tablets by mouth every 4 (four) hours as needed for pain.   Provider Default, MD [address] [place] [address] [phone]    [facility] EMERGENCY DEPARTMENT [address] [id number] [address]  [place] [address] [phone]   [facility] [address] [place] [address]  Schedule an appointment as soon as possible for a visit    Audelia Hives, MD 11/21/12 [phone]

## 2012-11-22 LAB — GC/CHLAMYDIA PROBE AMP
CT Probe RNA: NEGATIVE
GC Probe RNA: NEGATIVE

## 2012-11-22 NOTE — ED Provider Notes (Signed)
I saw and evaluated the patient, reviewed the resident's note and I agree with the findings and plan.   Tobin Chad, MD 11/22/12 [phone]

## 2013-02-21 ENCOUNTER — Emergency Department (HOSPITAL_COMMUNITY)
Admission: EM | Admit: 2013-02-21 | Discharge: 2013-02-21 | Disposition: A | Discharge status: Home / Self Care | Payer: Medicaid Other | Attending: Emergency Medicine | Admitting: Emergency Medicine

## 2013-02-21 ENCOUNTER — Emergency Department (HOSPITAL_COMMUNITY): Payer: Medicaid Other

## 2013-02-21 ENCOUNTER — Encounter (HOSPITAL_COMMUNITY): Payer: Self-pay | Admitting: Emergency Medicine

## 2013-02-21 DIAGNOSIS — F17200 Nicotine dependence, unspecified, uncomplicated: Secondary | ICD-10-CM | POA: Insufficient documentation

## 2013-02-21 DIAGNOSIS — Z8659 Personal history of other mental and behavioral disorders: Secondary | ICD-10-CM | POA: Insufficient documentation

## 2013-02-21 DIAGNOSIS — J029 Acute pharyngitis, unspecified: Secondary | ICD-10-CM

## 2013-02-21 DIAGNOSIS — I10 Essential (primary) hypertension: Secondary | ICD-10-CM | POA: Insufficient documentation

## 2013-02-21 DIAGNOSIS — Z8719 Personal history of other diseases of the digestive system: Secondary | ICD-10-CM | POA: Insufficient documentation

## 2013-02-21 DIAGNOSIS — Z8742 Personal history of other diseases of the female genital tract: Secondary | ICD-10-CM | POA: Insufficient documentation

## 2013-02-21 DIAGNOSIS — IMO0001 Reserved for inherently not codable concepts without codable children: Secondary | ICD-10-CM | POA: Insufficient documentation

## 2013-02-21 DIAGNOSIS — R51 Headache: Secondary | ICD-10-CM | POA: Insufficient documentation

## 2013-02-21 DIAGNOSIS — R079 Chest pain, unspecified: Secondary | ICD-10-CM | POA: Insufficient documentation

## 2013-02-21 DIAGNOSIS — J069 Acute upper respiratory infection, unspecified: Secondary | ICD-10-CM | POA: Insufficient documentation

## 2013-02-21 DIAGNOSIS — R21 Rash and other nonspecific skin eruption: Secondary | ICD-10-CM | POA: Insufficient documentation

## 2013-02-21 DIAGNOSIS — Z8669 Personal history of other diseases of the nervous system and sense organs: Secondary | ICD-10-CM | POA: Insufficient documentation

## 2013-02-21 LAB — RAPID STREP SCREEN (MED CTR MEBANE ONLY): Streptococcus, Group A Screen (Direct): NEGATIVE

## 2013-02-21 MED ORDER — GUAIFENESIN ER 600 MG PO TB12: 1200.0000 mg | ORAL_TABLET | Freq: Two times a day (BID) | ORAL | Status: DC

## 2013-02-21 MED ORDER — ACETAMINOPHEN 500 MG PO TABS
1000.0000 mg | ORAL_TABLET | Freq: Once | ORAL | Status: AC
  Administered 2013-02-21: 1000 mg via ORAL
  Filled 2013-02-21: qty 2

## 2013-02-21 MED ORDER — HYDROCODONE-HOMATROPINE 5-1.5 MG/5ML PO SYRP: 2.5000 mL | ORAL_SOLUTION | Freq: Four times a day (QID) | ORAL | Status: DC | PRN

## 2013-02-21 NOTE — ED Notes (Signed)
PT. REPORTS PERSISTENT DRY COUGH / CHEST CONGESTION WITH SORE THROAT , PAIN AT MID CHEST AND LEFT ABDOMEN WHEN COUGHING .

## 2013-02-21 NOTE — ED Provider Notes (Addendum)
History     CSN: [id number]  Arrival date & time 02/21/13  0204   First MD Initiated Contact with Patient 02/21/13 [phone]      Chief Complaint  Patient presents with  . Cough  . Sore Throat    (Consider location/radiation/quality/duration/timing/severity/associated sxs/prior treatment) HPI  SUBJECTIVE:  Leah Barry is a 27 y.o. female who complains of sore throat, productive cough, myalgias and headache for 2 days. She denies a history of anorexia, chills, dizziness, fatigue, fevers, myalgias, nausea, shortness of breath, sweats, vomiting and weakness and denies a history of asthma. Patient has and admits to smoke cigarettes.      Past Medical History  Diagnosis Date  . Hypertension   . Anxiety   . Irritable bowel   . Bipolar 1 disorder   . Apnea, sleep   . PID (acute pelvic inflammatory disease)   . PID (pelvic inflammatory disease)   . Back ache     Past Surgical History  Procedure Laterality Date  . Ovarian cyst removal      No family history on file.  History  Substance Use Topics  . Smoking status: Current Every Day Smoker -- 0.01 packs/day    Types: Cigarettes  . Smokeless tobacco: Not on file  . Alcohol Use: Yes     Comment: occasional drinker of all, but not in about a month    OB History   Grav Para Term Preterm Abortions TAB SAB Ect Mult Living   4 4 4       4       Review of Systems  Constitutional: Negative for fever and chills.  HENT: Positive for sore throat. Negative for drooling, trouble swallowing, neck stiffness and sinus pressure.   Eyes: Negative for photophobia and visual disturbance.  Respiratory: Positive for cough. Negative for shortness of breath.   Cardiovascular: Positive for chest pain (with cough).  Gastrointestinal: Negative for nausea, vomiting, abdominal pain, diarrhea and constipation.  Genitourinary: Negative for dysuria and hematuria.  Musculoskeletal: Negative for myalgias and arthralgias.  Skin: Positive for  rash.  Neurological: Positive for headaches. Negative for dizziness, seizures, syncope, weakness and numbness.  All other systems reviewed and are negative.    Allergies  Bactrim; Flagyl; Naproxen; and Sulfa antibiotics  Home Medications  No current outpatient prescriptions on file.  BP 138/67  Pulse 106  Temp(Src) 101.7 F (38.7 C) (Oral)  Resp 16  SpO2 97%  Physical Exam Appears moderately ill but not toxic; temperature as noted in vitals. Ears normal. Eyes:glassy appearance, no discharge  Heart: RRR, NO M/G/R Throat and pharynx erythematous. Cobble stoning on post pharynx consistent with post nasal drip. No exudate, uvula midline. Airway patent. Neck supple. No adenopathyhy in the neck.  Sinuses non tender.  The chest is clear. Abdomen is soft and nontende  ED Course  Procedures (including critical care time)  Labs Reviewed  RAPID STREP SCREEN   Dg Chest 2 View  02/21/2013  *RADIOLOGY REPORT*  Clinical Data: Cough and sore throat; left-sided chest pain. History of smoking.  CHEST - 2 VIEW  Comparison: Chest radiograph performed 05/27/2012  Findings: The lungs are relatively well-aerated.  Mild vascular congestion is seen.  There is no evidence of focal opacification, pleural effusion or pneumothorax.  The heart is normal in size; the mediastinal contour is within normal limits.  No acute osseous abnormalities are seen.  IMPRESSION: Mild vascular congestion seen; lungs remain grossly clear.   Original Report Authenticated By: Tonia Ghent, M.D.  1. URI (upper respiratory infection)   2. Pharyngitis       MDM  3:34 AM BP 138/67  Pulse 106  Temp(Src) 101.7 F (38.7 C) (Oral)  Resp 16  SpO2 97% Pt CXR negative for acute infiltrate. Patients symptoms are consistent with URI, likely viral etiology. Discussed that antibiotics are not indicated for viral infections. Pt will be discharged with symptomatic treatment.  Verbalizes understanding and is agreeable with  plan. Pt is hemodynamically stable & in NAD prior to dc.         Arthor Captain, PA-C 02/23/13 1831  Arthor Captain, PA-C 03/17/13 [phone]

## 2013-02-21 NOTE — ED Notes (Signed)
The pt has multiple complaints cold cough pain and chest congestion.  She feels pain all over her body.  All this started this am

## 2013-02-24 NOTE — ED Provider Notes (Signed)
Medical screening examination/treatment/procedure(s) were performed by non-physician practitioner and as supervising physician I was immediately available for consultation/collaboration.   Shamela Haydon H Aleda Madl, MD 02/24/13 0655 

## 2013-03-06 ENCOUNTER — Telehealth: Payer: Self-pay | Admitting: *Deleted

## 2013-03-06 ENCOUNTER — Inpatient Hospital Stay (HOSPITAL_COMMUNITY)
Admission: AD | Admit: 2013-03-06 | Discharge: 2013-03-06 | Disposition: A | Discharge status: Home / Self Care | Payer: Medicaid Other | Source: Ambulatory Visit | Attending: Obstetrics & Gynecology | Admitting: Obstetrics & Gynecology

## 2013-03-06 ENCOUNTER — Encounter (HOSPITAL_COMMUNITY): Payer: Self-pay | Admitting: *Deleted

## 2013-03-06 DIAGNOSIS — R109 Unspecified abdominal pain: Secondary | ICD-10-CM | POA: Insufficient documentation

## 2013-03-06 DIAGNOSIS — N949 Unspecified condition associated with female genital organs and menstrual cycle: Secondary | ICD-10-CM | POA: Insufficient documentation

## 2013-03-06 DIAGNOSIS — N730 Acute parametritis and pelvic cellulitis: Secondary | ICD-10-CM | POA: Insufficient documentation

## 2013-03-06 LAB — POCT PREGNANCY, URINE: Preg Test, Ur: NEGATIVE

## 2013-03-06 LAB — WET PREP, GENITAL
Trich, Wet Prep: NONE SEEN
Yeast Wet Prep HPF POC: NONE SEEN

## 2013-03-06 LAB — URINALYSIS, ROUTINE W REFLEX MICROSCOPIC
Bilirubin Urine: NEGATIVE
Glucose, UA: NEGATIVE mg/dL
Hgb urine dipstick: NEGATIVE
Ketones, ur: NEGATIVE mg/dL
Nitrite: NEGATIVE
Protein, ur: NEGATIVE mg/dL
Specific Gravity, Urine: >1.03 — ABNORMAL HIGH (ref 1.005–1.030)
Urobilinogen, UA: 0.2 mg/dL (ref 0.0–1.0)
pH: 5.5 (ref 5.0–8.0)

## 2013-03-06 LAB — URINE MICROSCOPIC-ADD ON

## 2013-03-06 MED ORDER — IBUPROFEN 600 MG PO TABS: 600.0000 mg | ORAL_TABLET | Freq: Four times a day (QID) | ORAL | Status: DC | PRN

## 2013-03-06 MED ORDER — CEFTRIAXONE SODIUM 250 MG IJ SOLR
250.0000 mg | Freq: Once | INTRAMUSCULAR | Status: AC
  Administered 2013-03-06: 250 mg via INTRAMUSCULAR
  Filled 2013-03-06: qty 250

## 2013-03-06 MED ORDER — IBUPROFEN 600 MG PO TABS
600.0000 mg | ORAL_TABLET | Freq: Four times a day (QID) | ORAL | Status: DC | PRN
  Administered 2013-03-06: 600 mg via ORAL
  Filled 2013-03-06 (×2): qty 1

## 2013-03-06 MED ORDER — AZITHROMYCIN 1 G PO PACK
1.0000 g | PACK | Freq: Once | ORAL | Status: AC
  Administered 2013-03-06: 1 g via ORAL
  Filled 2013-03-06: qty 1

## 2013-03-06 NOTE — Telephone Encounter (Signed)
Pt left message on machine to call back. I returned call at 11:43 AM and no answer, LMOMTCB if needed.

## 2013-03-06 NOTE — MAU Note (Signed)
A year ago was admitted with PID.  Pain started 2 days ago (like before) lower abd to left side.  Takes Aleve, is not helping.  Has a d/c for past wk, has a bad odor, has been douching with vinegar and water- douching is painful. (instructed pt to stop).  Started bleeding a couple days ago during intercourse.  Called office, and can not get in until 05/10.

## 2013-03-06 NOTE — MAU Provider Note (Signed)
Chief Complaint:  Abdominal Pain and Vaginal Discharge   First Provider Initiated Contact with Patient 03/06/13 1125      HPI: Leah Barry is a 27 y.o. [id number] who presents to maternity admissions reporting several day history of left greater than right pelvic pain and irritative malodorous vaginal discharge. She's been vinegar douching daily for the past few days. Has pain with intercourse and spotting with intercourse. Symptoms similar to this when she had PID one year ago. Also has sore throat and has had oral sex. Tried Aleve 2 tablets without benefit. Has had the same sex partner for the past 8 months; not mutually monogamous and has unprotected intercourse. Contraception is BTL.    Past Medical History: Past Medical History  Diagnosis Date  . Hypertension   . Anxiety   . Irritable bowel   . Bipolar 1 disorder   . Apnea, sleep   . PID (acute pelvic inflammatory disease)   . PID (pelvic inflammatory disease)   . Back ache     Past obstetric history: OB History   Grav Para Term Preterm Abortions TAB SAB Ect Mult Living   4 4 4       4      # Outc Date GA Lbr Len/2nd Wgt Sex Del Anes PTL Lv   1 TRM      SVD   Yes   2 TRM      SVD   Yes   3 TRM      SVD   Yes   4 TRM      SVD   Yes      Past Surgical History: Past Surgical History  Procedure Laterality Date  . Ovarian cyst removal      Family History: History reviewed. No pertinent family history.  Social History: History  Substance Use Topics  . Smoking status: Current Every Day Smoker -- 0.01 packs/day    Types: Cigarettes  . Smokeless tobacco: Not on file  . Alcohol Use: Yes     Comment: occasional drinker of all, but not in about a month    Allergies:  Allergies  Allergen Reactions  . Bactrim Swelling    Eyes swell  . Flagyl (Metronidazole) Nausea And Vomiting  . Naproxen Other (See Comments)    Nose bleeds  . Sulfa Antibiotics Swelling    Meds:  Prescriptions prior to admission  Medication  Sig Dispense Refill  . guaiFENesin (MUCINEX) 600 MG 12 hr tablet Take 2 tablets (1,200 mg total) by mouth 2 (two) times daily.  30 tablet  0  . HYDROcodone-homatropine (HYCODAN) 5-1.5 MG/5ML syrup Take 2.5 mLs by mouth every 6 (six) hours as needed for cough.  120 mL  0    ROS: Pertinent findings in history of present illness.  Physical Exam  Blood pressure 139/83, pulse 97, temperature 99 F (37.2 C), temperature source Oral, resp. rate 18, height 5\' 5"  (1.651 m), weight 209 lb (94.802 kg), last menstrual period 01/15/2013. GENERAL: Well-developed, well-nourished female in no acute distress.  HEENT: normocephalic. Oropharynx slightly reddened, no exudate, no tonsillar enlargement HEART: normal rate RESP: normal effort ABDOMEN: Soft, non-tender, gravid appropriate for gestational age EXTREMITIES: Nontender, no edema NEURO: alert and oriented SPECULUM EXAM: NEFG, moderate amount gray discharge, no blood, cervix clean Bimanual positive CMT, uterus and adnexae moderately tender, left greater than right;left adnexal fullness;  no discrete mass noted left or right adnexae       Labs: Results for orders placed during the hospital encounter  of 03/06/13 (from the past 24 hour(s))  URINALYSIS, ROUTINE W REFLEX MICROSCOPIC     Status: Abnormal   Collection Time    03/06/13 11:15 AM      Result Value Range   Color, Urine YELLOW  YELLOW   APPearance CLEAR  CLEAR   Specific Gravity, Urine >1.030 (*) 1.005 - 1.030   pH 5.5  5.0 - 8.0   Glucose, UA NEGATIVE  NEGATIVE mg/dL   Hgb urine dipstick NEGATIVE  NEGATIVE   Bilirubin Urine NEGATIVE  NEGATIVE   Ketones, ur NEGATIVE  NEGATIVE mg/dL   Protein, ur NEGATIVE  NEGATIVE mg/dL   Urobilinogen, UA 0.2  0.0 - 1.0 mg/dL   Nitrite NEGATIVE  NEGATIVE   Leukocytes, UA SMALL (*) NEGATIVE  URINE MICROSCOPIC-ADD ON     Status: Abnormal   Collection Time    03/06/13 11:15 AM      Result Value Range   Squamous Epithelial / LPF RARE  RARE   WBC, UA  0-2  <3 WBC/hpf   Bacteria, UA FEW (*) RARE  POCT PREGNANCY, URINE     Status: None   Collection Time    03/06/13 11:45 AM      Result Value Range   Preg Test, Ur NEGATIVE  NEGATIVE  WET PREP, GENITAL     Status: Abnormal   Collection Time    03/06/13 11:46 AM      Result Value Range   Yeast Wet Prep HPF POC NONE SEEN  NONE SEEN   Trich, Wet Prep NONE SEEN  NONE SEEN   Clue Cells Wet Prep HPF POC FEW (*) NONE SEEN   WBC, Wet Prep HPF POC FEW (*) NONE SEEN    MAU Course: GC/CT sentRocephin, Zithromax given for presumed PID; ibuprofen 600 mg by mouth for pain  Assessment: 1. Acute PID (pelvic inflammatory disease)     Plan: Discharge home Advised to go to STD clinic at health Department for STI blood testing and refrain from intercourse until partner treated  Labor precautions and fetal kick counts    Medication List     As of 03/06/2013 12:37 PM    Notice      You have not been prescribed any medications.         Danae Orleans, CNM 03/06/2013 11:26 AM

## 2013-03-06 NOTE — MAU Note (Signed)
Has sore throat, hurts to swallow, feels like it is inflamed.  Asked if you could get an infection (std) from oral sex.

## 2013-03-07 LAB — GC/CHLAMYDIA PROBE AMP
CT Probe RNA: POSITIVE — AB
GC Probe RNA: POSITIVE — AB

## 2013-03-12 ENCOUNTER — Encounter: Payer: Self-pay | Admitting: Obstetrics

## 2013-03-12 ENCOUNTER — Ambulatory Visit (INDEPENDENT_AMBULATORY_CARE_PROVIDER_SITE_OTHER): Payer: Medicaid Other | Admitting: Obstetrics

## 2013-03-12 VITALS — BP 121/85 | HR 69 | Temp 97.5°F | Ht 67.0 in | Wt 205.0 lb

## 2013-03-12 DIAGNOSIS — Z113 Encounter for screening for infections with a predominantly sexual mode of transmission: Secondary | ICD-10-CM

## 2013-03-12 DIAGNOSIS — N739 Female pelvic inflammatory disease, unspecified: Secondary | ICD-10-CM

## 2013-03-12 DIAGNOSIS — Z202 Contact with and (suspected) exposure to infections with a predominantly sexual mode of transmission: Secondary | ICD-10-CM

## 2013-03-12 DIAGNOSIS — N949 Unspecified condition associated with female genital organs and menstrual cycle: Secondary | ICD-10-CM

## 2013-03-12 DIAGNOSIS — Z2089 Contact with and (suspected) exposure to other communicable diseases: Secondary | ICD-10-CM

## 2013-03-12 MED ORDER — LEVOFLOXACIN 750 MG PO TABS: 750.0000 mg | ORAL_TABLET | Freq: Every day | ORAL | Status: DC

## 2013-03-12 MED ORDER — OXYCODONE-ACETAMINOPHEN 10-325 MG PO TABS: 1.0000 | ORAL_TABLET | ORAL | Status: DC | PRN

## 2013-03-12 MED ORDER — CEFTRIAXONE SODIUM 1 G IJ SOLR
250.0000 mg | Freq: Once | INTRAMUSCULAR | Status: AC
  Administered 2013-03-12: 250 mg via INTRAMUSCULAR

## 2013-03-12 NOTE — Progress Notes (Signed)
.   Subjective:     Leah Barry is a 27 y.o. female here for a problem visit.   Current complaints: abdominal pain and abnormal bleeding (2nd cycle this month).  She was treated for [facility] on 03/06/13 at [facility] and has since had intercourse with her untreated partner.  She has a history of PID (08/2012 - positive for [facility]) and says that her pain feels the same.  Personal health questionnaire reviewed: yes.   Gynecologic History Patient's last menstrual period was 03/10/2013. Contraception: tubal ligation Last Pap:.03/2006 Results were: abnormal - LSIL Last mammogram: N/A  Obstetric History OB History   Grav Para Term Preterm Abortions TAB SAB Ect Mult Living   4 4 4       4      # Outc Date GA Lbr Len/2nd Wgt Sex Del Anes PTL Lv   1 TRM      SVD   Yes   2 TRM      SVD   Yes   3 TRM      SVD   Yes   4 TRM      SVD   Yes       The following portions of the patient's history were reviewed and updated as appropriate: allergies, current medications, past family history, past medical history, past social history, past surgical history and problem list.  Review of Systems Pertinent items are noted in HPI.    Objective:    General appearance: alert and no distress Abdomen: abnormal findings:  moderate tenderness in the RLQ and in the LLQ Pelvic: cervix normal in appearance, external genitalia normal, no adnexal masses or tenderness, positive findings: tenderness of bilateral adnexa(e), rectovaginal septum normal, uterus normal size, shape, and consistency and vagina normal without discharge   Patient on period.  Assessment:    PID, recurrent.   Plan:    Education reviewed: safe sex/STD prevention and PID. Follow up in: 2 weeks. Levaquin Rx Percocet Rx

## 2013-03-13 ENCOUNTER — Encounter: Payer: Self-pay | Admitting: Obstetrics

## 2013-03-13 ENCOUNTER — Other Ambulatory Visit: Payer: Self-pay | Admitting: Obstetrics

## 2013-03-13 ENCOUNTER — Ambulatory Visit (HOSPITAL_COMMUNITY)
Admission: RE | Admit: 2013-03-13 | Discharge: 2013-03-13 | Disposition: A | Discharge status: Home / Self Care | Payer: Medicaid Other | Source: Ambulatory Visit | Attending: Obstetrics | Admitting: Obstetrics

## 2013-03-13 DIAGNOSIS — N949 Unspecified condition associated with female genital organs and menstrual cycle: Secondary | ICD-10-CM | POA: Insufficient documentation

## 2013-03-13 DIAGNOSIS — R1032 Left lower quadrant pain: Secondary | ICD-10-CM | POA: Insufficient documentation

## 2013-03-13 DIAGNOSIS — N739 Female pelvic inflammatory disease, unspecified: Secondary | ICD-10-CM

## 2013-03-13 DIAGNOSIS — N938 Other specified abnormal uterine and vaginal bleeding: Secondary | ICD-10-CM | POA: Insufficient documentation

## 2013-03-13 DIAGNOSIS — N925 Other specified irregular menstruation: Secondary | ICD-10-CM | POA: Insufficient documentation

## 2013-03-13 NOTE — Patient Instructions (Signed)
PID

## 2013-03-14 NOTE — Telephone Encounter (Signed)
Pt seen in office this week

## 2013-03-15 LAB — GONOCOCCUS CULTURE: Organism ID, Bacteria: NO GROWTH

## 2013-03-17 LAB — CHLAMYDIA CULTURE

## 2013-03-17 NOTE — ED Provider Notes (Signed)
Medical screening examination/treatment/procedure(s) were performed by non-physician practitioner and as supervising physician I was immediately available for consultation/collaboration.   Richardean Canal, MD 03/17/13 [phone]

## 2013-03-25 ENCOUNTER — Ambulatory Visit: Payer: Medicaid Other | Admitting: Obstetrics

## 2013-04-03 ENCOUNTER — Ambulatory Visit: Payer: Medicaid Other | Admitting: Obstetrics

## 2013-04-03 ENCOUNTER — Ambulatory Visit (INDEPENDENT_AMBULATORY_CARE_PROVIDER_SITE_OTHER): Payer: Medicaid Other | Admitting: Obstetrics

## 2013-04-03 VITALS — BP 112/76 | HR 80 | Temp 98.3°F | Ht 67.0 in | Wt 208.2 lb

## 2013-04-03 DIAGNOSIS — N739 Female pelvic inflammatory disease, unspecified: Secondary | ICD-10-CM

## 2013-04-03 DIAGNOSIS — A499 Bacterial infection, unspecified: Secondary | ICD-10-CM

## 2013-04-03 DIAGNOSIS — N949 Unspecified condition associated with female genital organs and menstrual cycle: Secondary | ICD-10-CM

## 2013-04-03 DIAGNOSIS — B9689 Other specified bacterial agents as the cause of diseases classified elsewhere: Secondary | ICD-10-CM

## 2013-04-03 DIAGNOSIS — N760 Acute vaginitis: Secondary | ICD-10-CM

## 2013-04-03 MED ORDER — AMOXICILLIN-POT CLAVULANATE 875-125 MG PO TABS: 1.0000 | ORAL_TABLET | Freq: Two times a day (BID) | ORAL | Status: DC

## 2013-04-03 MED ORDER — METRONIDAZOLE 0.75 % VA GEL: 1.0000 | Freq: Two times a day (BID) | VAGINAL | Status: DC

## 2013-04-03 MED ORDER — OXYCODONE-ACETAMINOPHEN 10-325 MG PO TABS: 1.0000 | ORAL_TABLET | Freq: Four times a day (QID) | ORAL | Status: DC | PRN

## 2013-04-03 MED ORDER — LEVOFLOXACIN 750 MG PO TABS: 750.0000 mg | ORAL_TABLET | Freq: Every day | ORAL | Status: DC

## 2013-04-03 NOTE — Progress Notes (Signed)
.   Subjective:     Leah Barry is a 27 y.o. female here for a follow up exam.  Current complaints: Patient states that she still has an abnormal discharge.  Brown/yellowish with odor.  Personal health questionnaire reviewed: yes.   Gynecologic History Patient's last menstrual period was 03/10/2013. Contraception: tubal ligation Last Pap: 2013. Results were: normal Last mammogram:N/A  Obstetric History OB History   Grav Para Term Preterm Abortions TAB SAB Ect Mult Living   4 4 4       4      # Outc Date GA Lbr Len/2nd Wgt Sex Del Anes PTL Lv   1 TRM      SVD   Yes   2 TRM      SVD   Yes   3 TRM      SVD   Yes   4 TRM      SVD   Yes       The following portions of the patient's history were reviewed and updated as appropriate: allergies, current medications, past family history, past medical history, past social history, past surgical history and problem list.  Review of Systems Pertinent items are noted in HPI.    Objective:    General appearance: alert and no distress Abdomen: soft, non-tender; bowel sounds normal; no masses,  no organomegaly Pelvic: cervix normal in appearance, external genitalia normal, no cervical motion tenderness, uterus normal size, shape, and consistency and adnexa with bilateral tenderness, less.  Vagina with white discharge.    Assessment:    PID.  Resolving.  Recent ultrasound was normal tubes and ovaries.  BV.   Plan:    Education reviewed: safe sex/STD prevention.     F/U 4 weeks.  Levaquin and Augmentin Rx for 2 weeks.  Metro Gel Vaginal Rx.

## 2013-04-04 ENCOUNTER — Encounter: Payer: Self-pay | Admitting: Obstetrics

## 2013-05-01 ENCOUNTER — Ambulatory Visit: Payer: Medicaid Other | Admitting: Obstetrics

## 2013-06-11 ENCOUNTER — Emergency Department (HOSPITAL_COMMUNITY)
Admission: EM | Admit: 2013-06-11 | Discharge: 2013-06-11 | Disposition: A | Discharge status: Home / Self Care | Payer: Self-pay | Attending: Emergency Medicine | Admitting: Emergency Medicine

## 2013-06-11 ENCOUNTER — Emergency Department (HOSPITAL_COMMUNITY): Payer: Self-pay

## 2013-06-11 ENCOUNTER — Encounter (HOSPITAL_COMMUNITY): Payer: Self-pay | Admitting: Emergency Medicine

## 2013-06-11 DIAGNOSIS — Z8719 Personal history of other diseases of the digestive system: Secondary | ICD-10-CM | POA: Insufficient documentation

## 2013-06-11 DIAGNOSIS — F17200 Nicotine dependence, unspecified, uncomplicated: Secondary | ICD-10-CM | POA: Insufficient documentation

## 2013-06-11 DIAGNOSIS — N898 Other specified noninflammatory disorders of vagina: Secondary | ICD-10-CM | POA: Insufficient documentation

## 2013-06-11 DIAGNOSIS — Z3202 Encounter for pregnancy test, result negative: Secondary | ICD-10-CM | POA: Insufficient documentation

## 2013-06-11 DIAGNOSIS — N730 Acute parametritis and pelvic cellulitis: Secondary | ICD-10-CM | POA: Insufficient documentation

## 2013-06-11 DIAGNOSIS — Z8659 Personal history of other mental and behavioral disorders: Secondary | ICD-10-CM | POA: Insufficient documentation

## 2013-06-11 DIAGNOSIS — I10 Essential (primary) hypertension: Secondary | ICD-10-CM | POA: Insufficient documentation

## 2013-06-11 LAB — URINALYSIS, ROUTINE W REFLEX MICROSCOPIC
Bilirubin Urine: NEGATIVE
Glucose, UA: NEGATIVE mg/dL
Hgb urine dipstick: NEGATIVE
Ketones, ur: NEGATIVE mg/dL
Nitrite: NEGATIVE
Protein, ur: NEGATIVE mg/dL
Specific Gravity, Urine: 1.03 (ref 1.005–1.030)
Urobilinogen, UA: 1 mg/dL (ref 0.0–1.0)
pH: 6 (ref 5.0–8.0)

## 2013-06-11 LAB — URINE MICROSCOPIC-ADD ON

## 2013-06-11 LAB — WET PREP, GENITAL
Trich, Wet Prep: NONE SEEN
Yeast Wet Prep HPF POC: NONE SEEN

## 2013-06-11 LAB — POCT PREGNANCY, URINE: Preg Test, Ur: NEGATIVE

## 2013-06-11 MED ORDER — CEFTRIAXONE SODIUM 250 MG IJ SOLR
250.0000 mg | Freq: Once | INTRAMUSCULAR | Status: AC
  Administered 2013-06-11: 250 mg via INTRAMUSCULAR
  Filled 2013-06-11: qty 250

## 2013-06-11 MED ORDER — OXYCODONE-ACETAMINOPHEN 5-325 MG PO TABS
1.0000 | ORAL_TABLET | Freq: Once | ORAL | Status: AC
  Administered 2013-06-11: 1 via ORAL
  Filled 2013-06-11: qty 1

## 2013-06-11 MED ORDER — DOXYCYCLINE HYCLATE 100 MG PO CAPS: 100.0000 mg | ORAL_CAPSULE | Freq: Two times a day (BID) | ORAL | Status: DC

## 2013-06-11 MED ORDER — HYDROCODONE-ACETAMINOPHEN 5-325 MG PO TABS: 1.0000 | ORAL_TABLET | Freq: Four times a day (QID) | ORAL | Status: DC | PRN

## 2013-06-11 MED ORDER — LIDOCAINE HCL (PF) 1 % IJ SOLN
INTRAMUSCULAR | Status: AC
  Administered 2013-06-11: 5 mL
  Filled 2013-06-11: qty 5

## 2013-06-11 MED ORDER — ONDANSETRON 4 MG PO TBDP
8.0000 mg | ORAL_TABLET | Freq: Once | ORAL | Status: AC
  Administered 2013-06-11: 8 mg via ORAL
  Filled 2013-06-11: qty 2

## 2013-06-11 NOTE — ED Notes (Addendum)
Pt stated had a vaginal discharge with  half  golf ball size clot about two week ago. " this happen after one week after her monthly circle. Followed by  pressure /sharp abdominal pain, also pain to lower left back, having lack of energy, fatigue, HA and nauseated most times. Having frequent urination constantly.

## 2013-06-11 NOTE — ED Provider Notes (Signed)
Medical screening examination/treatment/procedure(s) were performed by non-physician practitioner and as supervising physician I was immediately available for consultation/collaboration.  Flint Melter, MD 06/11/13 1950

## 2013-06-11 NOTE — ED Provider Notes (Signed)
CSN: [id number]     Arrival date & time 06/11/13  1349 History     First MD Initiated Contact with Patient 06/11/13 1606     Chief Complaint  Patient presents with  . Abdominal Pain   (Consider location/radiation/quality/duration/timing/severity/associated sxs/prior Treatment) HPI Patient presents emergency department with lower pelvic abdominal pain.  Patient, states this started 3 days, ago.  She states, that she's had previous pains that are similar to this with PID.  Patient, states, that she's not had any nausea, vomiting, diarrhea, headache, blurred vision, weakness, dizziness, syncope, fever, diarrhea, rash or chest pain, or shortness of breath.  Patient, states, that she did not take any medications prior to arrival.  She states, that she's had some vaginal discharge, but no vaginal bleeding.  He should states, that nothing seems to make her condition, better or worse. Past Medical History  Diagnosis Date  . Hypertension   . Anxiety   . Irritable bowel   . Bipolar 1 disorder   . Apnea, sleep   . PID (acute pelvic inflammatory disease)   . PID (pelvic inflammatory disease)   . Back ache    Past Surgical History  Procedure Laterality Date  . Ovarian cyst removal    . Tubal ligation     Family History  Problem Relation Age of Onset  . Heart disease Mother   . Cancer Maternal Grandmother     breast  . Cancer Paternal Grandmother     breast   History  Substance Use Topics  . Smoking status: Current Every Day Smoker -- 0.25 packs/day for 5 years    Types: Cigarettes  . Smokeless tobacco: Never Used  . Alcohol Use: No     Comment: occasional drinker of all, but not in about a month   OB History   Grav Para Term Preterm Abortions TAB SAB Ect Mult Living   4 4 4       4      Review of Systems All other systems negative except as documented in the HPI. All pertinent positives and negatives as reviewed in the HPI. Allergies  Sulfa antibiotics; Flagyl; and  Naproxen  Home Medications  No current outpatient prescriptions on file. BP 116/82  Pulse 85  Temp(Src) 97.5 F (36.4 C) (Oral)  Resp 16  SpO2 98%  LMP 05/13/2013 Physical Exam  Nursing note and vitals reviewed. Constitutional: She is oriented to person, place, and time. She appears well-developed and well-nourished. No distress.  HENT:  Head: Normocephalic and atraumatic.  Mouth/Throat: Oropharynx is clear and moist.  Eyes: Pupils are equal, round, and reactive to light.  Cardiovascular: Normal rate, regular rhythm and normal heart sounds.   Pulmonary/Chest: Effort normal and breath sounds normal.  Abdominal: Soft. Bowel sounds are normal. She exhibits no distension. There is tenderness. There is no rebound.  Genitourinary: Cervix exhibits motion tenderness and discharge. Right adnexum displays tenderness. Right adnexum displays no mass and no fullness. Left adnexum displays tenderness. Left adnexum displays no mass and no fullness. There is tenderness around the vagina. No erythema around the vagina. Vaginal discharge found.  Neurological: She is alert and oriented to person, place, and time.  Skin: Skin is warm and dry. No rash noted.    ED Course   Procedures (including critical care time)  Labs Reviewed  URINALYSIS, ROUTINE W REFLEX MICROSCOPIC - Abnormal; Notable for the following:    APPearance HAZY (*)    Leukocytes, UA SMALL (*)    All other components within  normal limits  URINE MICROSCOPIC-ADD ON - Abnormal; Notable for the following:    Squamous Epithelial / LPF MANY (*)    Bacteria, UA MANY (*)    All other components within normal limits  GC/CHLAMYDIA PROBE AMP  WET PREP, GENITAL  POCT PREGNANCY, URINE   US Transvaginal Non-ob  06/11/2013   *RADIOLOGY REPORT*  Clinical Data: Abdominal pain.  TRANSABDOMINAL AND TRANSVAGINAL ULTRASOUND OF PELVIS Technique:  Both transabdominal and transvaginal ultrasound examinations of the pelvis were performed. Transabdominal  technique was performed for global imaging of the pelvis including uterus, ovaries, adnexal regions, and pelvic cul-de-sac.  It was necessary to proceed with endovaginal exam following the transabdominal exam to visualize the ovaries.  Comparison:  Ultrasound dated 03/13/2013  Findings:  Uterus: Normal.  9.7 x 4.9 x 5.2 cm.  Endometrium: Normal.  11.7 mm in thickness.  Right ovary:  Normal.  3.3 x 2.4 x 2.3 cm.  1.3 cm follicle on the right ovary.  Left ovary: Normal.  3.4 x 1.9 x 2.5 cm.  Other findings: There is no free fluid.  No adnexal masses.  IMPRESSION: Normal study. No evidence of pelvic mass or other significant abnormality.  *RADIOLOGY REPORT*  DOPPLER ULTRASOUND OF OVARIES  Technique:  Color and duplex Doppler ultrasound was utilized to evaluate blood flow to the ovaries.  Findings: There is normal arterial and venous perfusion to both ovaries.  IMPRESSION: Normal perfusion to both ovaries.  No evidence of ovarian torsion.   Original Report Authenticated By: Francene Boyers, M.D.   US Pelvis Complete  06/11/2013   *RADIOLOGY REPORT*  Clinical Data: Abdominal pain.  TRANSABDOMINAL AND TRANSVAGINAL ULTRASOUND OF PELVIS Technique:  Both transabdominal and transvaginal ultrasound examinations of the pelvis were performed. Transabdominal technique was performed for global imaging of the pelvis including uterus, ovaries, adnexal regions, and pelvic cul-de-sac.  It was necessary to proceed with endovaginal exam following the transabdominal exam to visualize the ovaries.  Comparison:  Ultrasound dated 03/13/2013  Findings:  Uterus: Normal.  9.7 x 4.9 x 5.2 cm.  Endometrium: Normal.  11.7 mm in thickness.  Right ovary:  Normal.  3.3 x 2.4 x 2.3 cm.  1.3 cm follicle on the right ovary.  Left ovary: Normal.  3.4 x 1.9 x 2.5 cm.  Other findings: There is no free fluid.  No adnexal masses.  IMPRESSION: Normal study. No evidence of pelvic mass or other significant abnormality.  *RADIOLOGY REPORT*  DOPPLER ULTRASOUND  OF OVARIES  Technique:  Color and duplex Doppler ultrasound was utilized to evaluate blood flow to the ovaries.  Findings: There is normal arterial and venous perfusion to both ovaries.  IMPRESSION: Normal perfusion to both ovaries.  No evidence of ovarian torsion.   Original Report Authenticated By: Francene Boyers, M.D.   [place] Art/ven Flow Abd Pelv Doppler  06/11/2013   *RADIOLOGY REPORT*  Clinical Data: Abdominal pain.  TRANSABDOMINAL AND TRANSVAGINAL ULTRASOUND OF PELVIS Technique:  Both transabdominal and transvaginal ultrasound examinations of the pelvis were performed. Transabdominal technique was performed for global imaging of the pelvis including uterus, ovaries, adnexal regions, and pelvic cul-de-sac.  It was necessary to proceed with endovaginal exam following the transabdominal exam to visualize the ovaries.  Comparison:  Ultrasound dated 03/13/2013  Findings:  Uterus: Normal.  9.7 x 4.9 x 5.2 cm.  Endometrium: Normal.  11.7 mm in thickness.  Right ovary:  Normal.  3.3 x 2.4 x 2.3 cm.  1.3 cm follicle on the right ovary.  Left ovary: Normal.  3.4 x 1.9 x 2.5 cm.  Other findings: There is no free fluid.  No adnexal masses.  IMPRESSION: Normal study. No evidence of pelvic mass or other significant abnormality.  *RADIOLOGY REPORT*  DOPPLER ULTRASOUND OF OVARIES  Technique:  Color and duplex Doppler ultrasound was utilized to evaluate blood flow to the ovaries.  Findings: There is normal arterial and venous perfusion to both ovaries.  IMPRESSION: Normal perfusion to both ovaries.  No evidence of ovarian torsion.   Original Report Authenticated By: Francene Boyers, M.D.   Patient be treated for PID and advised return here as needed and also advised followup with her primary care doctor or GYN.  Patient had negative ultrasounds of her pelvis and normal Doppler flow to each ovary.  Patient is currently sexually active without protection.  She's had a history, PID and other STDs.  MDM    Carlyle Dolly, PA-C 06/11/13 1937  Carlyle Dolly, PA-C 06/11/13 1949

## 2013-06-11 NOTE — ED Notes (Signed)
Pt requesting food and drink. Imma RN made aware.

## 2013-06-11 NOTE — ED Notes (Signed)
Pt. Stated, I've had abdominal pain and left ear pain . The pain goes to my back

## 2013-06-12 LAB — GC/CHLAMYDIA PROBE AMP
CT Probe RNA: NEGATIVE
GC Probe RNA: NEGATIVE

## 2013-06-20 ENCOUNTER — Inpatient Hospital Stay (HOSPITAL_COMMUNITY)
Admission: AD | Admit: 2013-06-20 | Discharge: 2013-06-20 | Disposition: A | Discharge status: Home / Self Care | Payer: Medicaid Other | Source: Ambulatory Visit | Attending: Obstetrics | Admitting: Obstetrics

## 2013-06-20 ENCOUNTER — Encounter (HOSPITAL_COMMUNITY): Payer: Self-pay | Admitting: *Deleted

## 2013-06-20 DIAGNOSIS — A6000 Herpesviral infection of urogenital system, unspecified: Secondary | ICD-10-CM | POA: Insufficient documentation

## 2013-06-20 DIAGNOSIS — N949 Unspecified condition associated with female genital organs and menstrual cycle: Secondary | ICD-10-CM | POA: Insufficient documentation

## 2013-06-20 LAB — URINALYSIS, ROUTINE W REFLEX MICROSCOPIC
Bilirubin Urine: NEGATIVE
Glucose, UA: NEGATIVE mg/dL
Hgb urine dipstick: NEGATIVE
Ketones, ur: NEGATIVE mg/dL
Leukocytes, UA: NEGATIVE
Nitrite: NEGATIVE
Protein, ur: NEGATIVE mg/dL
Specific Gravity, Urine: >1.03 — ABNORMAL HIGH (ref 1.005–1.030)
Urobilinogen, UA: 1 mg/dL (ref 0.0–1.0)
pH: 6 (ref 5.0–8.0)

## 2013-06-20 LAB — CBC
HCT: 37.2 % (ref 36.0–46.0)
Hemoglobin: 13.1 g/dL (ref 12.0–15.0)
MCH: 26.1 pg (ref 26.0–34.0)
MCHC: 35.2 g/dL (ref 30.0–36.0)
MCV: 74.3 fL — ABNORMAL LOW (ref 78.0–100.0)
Platelets: 208 10*3/uL (ref 150–400)
RBC: 5.01 MIL/uL (ref 3.87–5.11)
RDW: 13.9 % (ref 11.5–15.5)
WBC: 10.1 10*3/uL (ref 4.0–10.5)

## 2013-06-20 LAB — POCT PREGNANCY, URINE: Preg Test, Ur: NEGATIVE

## 2013-06-20 MED ORDER — LIDOCAINE HCL 2 % EX GEL
Freq: Once | CUTANEOUS | Status: DC
  Filled 2013-06-20: qty 20

## 2013-06-20 MED ORDER — ACYCLOVIR 400 MG PO TABS
400.0000 mg | ORAL_TABLET | Freq: Three times a day (TID) | ORAL | Status: DC
Start: 2013-06-20 — End: 2013-07-29

## 2013-06-20 MED ORDER — ACYCLOVIR 200 MG PO CAPS
400.0000 mg | ORAL_CAPSULE | Freq: Once | ORAL | Status: AC
  Administered 2013-06-20: 400 mg via ORAL
  Filled 2013-06-20: qty 2

## 2013-06-20 MED ORDER — ACYCLOVIR 400 MG PO TABS: 400.0000 mg | ORAL_TABLET | Freq: Three times a day (TID) | ORAL | Status: DC

## 2013-06-20 NOTE — MAU Provider Note (Signed)
History     CSN: [id number]  Arrival date and time: 06/20/13 1217   First Provider Initiated Contact with Patient 06/20/13 1629      Chief Complaint  Patient presents with  . knots on vagina    HPI  Pt is a [id number] here report of increase pain and vaginal lesions x two days.  Pt has a history of herpes, however feels this outbreak feels different.  Also feels fatigue and chills.  +enlarged inguinal lymph nodes.    Past Medical History  Diagnosis Date  . Hypertension   . Anxiety   . Irritable bowel   . Bipolar 1 disorder   . Apnea, sleep   . PID (acute pelvic inflammatory disease)   . PID (pelvic inflammatory disease)   . Back ache   . Herpes infection     Past Surgical History  Procedure Laterality Date  . Ovarian cyst removal    . Tubal ligation      Family History  Problem Relation Age of Onset  . Heart disease Mother   . Cancer Maternal Grandmother     breast  . Cancer Paternal Grandmother     breast    History  Substance Use Topics  . Smoking status: Current Every Day Smoker -- 0.25 packs/day for 5 years    Types: Cigarettes  . Smokeless tobacco: Never Used  . Alcohol Use: No     Comment: occasional drinker of all, but not in about a month    Allergies:  Allergies  Allergen Reactions  . Sulfa Antibiotics Shortness Of Breath and Swelling    Eyes swell shut  . Flagyl (Metronidazole) Nausea And Vomiting  . Naproxen Other (See Comments)    Nose bleeds    Prescriptions prior to admission  Medication Sig Dispense Refill  . metroNIDAZOLE (METROGEL) 0.75 % gel Apply 1 application topically once. Applied to the knot on vagina. Was leftover from previous rx.      . doxycycline (VIBRAMYCIN) 100 MG capsule Take 1 capsule (100 mg total) by mouth 2 (two) times daily.  28 capsule  0  . HYDROcodone-acetaminophen (NORCO/VICODIN) 5-325 MG per tablet Take 1 tablet by mouth every 6 (six) hours as needed for pain.  15 tablet  0    Review of Systems    Constitutional: Positive for fever, chills and malaise/fatigue.  Respiratory: Negative.   Cardiovascular: Negative.   Gastrointestinal: Positive for nausea and abdominal pain (intermittent lower pelvic). Negative for vomiting.  Genitourinary: Positive for dysuria (on lesions).       Vaginal itching  Skin: Positive for rash (vaginal).   Physical Exam   Blood pressure 124/75, pulse 83, temperature 98.1 F (36.7 C), temperature source Oral, resp. rate 18, height 5\' 7"  (1.702 m), weight 97.07 kg (214 lb), last menstrual period 05/13/2013.  Physical Exam  Constitutional: She is oriented to person, place, and time. She appears well-developed and well-nourished. No distress.  Appears uncomfortable  HENT:  Head: Normocephalic.  Neck: Normal range of motion. Neck supple.  Cardiovascular: Normal rate, regular rhythm and normal heart sounds.   Respiratory: Effort normal and breath sounds normal. No respiratory distress.  GI: Soft. There is no tenderness. There is no CVA tenderness.  Genitourinary:    Cervix exhibits no motion tenderness and no discharge. Vaginal discharge (white, creamy) found.  Musculoskeletal: Normal range of motion.  Neurological: She is alert and oriented to person, place, and time.  Skin: Skin is warm and dry.  Psychiatric: She has a normal  mood and affect.    MAU Course  Procedures Results for orders placed during the hospital encounter of 06/20/13 (from the past 24 hour(s))  URINALYSIS, ROUTINE W REFLEX MICROSCOPIC     Status: Abnormal   Collection Time    06/20/13  1:00 PM      Result Value Range   Color, Urine YELLOW  YELLOW   APPearance CLEAR  CLEAR   Specific Gravity, Urine >1.030 (*) 1.005 - 1.030   pH 6.0  5.0 - 8.0   Glucose, UA NEGATIVE  NEGATIVE mg/dL   Hgb urine dipstick NEGATIVE  NEGATIVE   Bilirubin Urine NEGATIVE  NEGATIVE   Ketones, ur NEGATIVE  NEGATIVE mg/dL   Protein, ur NEGATIVE  NEGATIVE mg/dL   Urobilinogen, UA 1.0  0.0 - 1.0 mg/dL    Nitrite NEGATIVE  NEGATIVE   Leukocytes, UA NEGATIVE  NEGATIVE  POCT PREGNANCY, URINE     Status: None   Collection Time    06/20/13  2:00 PM      Result Value Range   Preg Test, Ur NEGATIVE  NEGATIVE  CBC     Status: Abnormal   Collection Time    06/20/13  5:09 PM      Result Value Range   WBC 10.1  4.0 - 10.5 K/uL   RBC 5.01  3.87 - 5.11 MIL/uL   Hemoglobin 13.1  12.0 - 15.0 g/dL   HCT 16.1  09.6 - 04.5 %   MCV 74.3 (*) 78.0 - 100.0 fL   MCH 26.1  26.0 - 34.0 pg   MCHC 35.2  30.0 - 36.0 g/dL   RDW 40.9  81.1 - 91.4 %   Platelets 208  150 - 400 K/uL    Assessment and Plan  Genital Herpes  Plan: Continue using lidocaine gel BID RPR pending Acyclovir 400 mg TID x 10 days #60 Continue doxycycline  [facility] 06/20/2013, 4:35 PM

## 2013-06-20 NOTE — MAU Note (Signed)
Seen @ [facility] one week ago, tested for STD's, unsure of results.  Hx of herpes, noted knots on vagina, also itching.  Very painful, hurts to urinate or wipe.

## 2013-06-21 LAB — RPR: RPR Ser Ql: NONREACTIVE

## 2013-06-30 ENCOUNTER — Encounter: Payer: Self-pay | Admitting: Obstetrics

## 2013-06-30 ENCOUNTER — Ambulatory Visit (INDEPENDENT_AMBULATORY_CARE_PROVIDER_SITE_OTHER): Payer: Medicaid Other | Admitting: Obstetrics

## 2013-06-30 VITALS — BP 129/86 | HR 77 | Temp 98.1°F | Ht 67.0 in | Wt 212.4 lb

## 2013-06-30 DIAGNOSIS — A6000 Herpesviral infection of urogenital system, unspecified: Secondary | ICD-10-CM

## 2013-06-30 DIAGNOSIS — N731 Chronic parametritis and pelvic cellulitis: Secondary | ICD-10-CM | POA: Insufficient documentation

## 2013-06-30 DIAGNOSIS — N920 Excessive and frequent menstruation with regular cycle: Secondary | ICD-10-CM

## 2013-06-30 MED ORDER — MEDROXYPROGESTERONE ACETATE 10 MG PO TABS: 10.0000 mg | ORAL_TABLET | Freq: Every day | ORAL | Status: DC

## 2013-06-30 MED ORDER — HYDROCODONE-IBUPROFEN 7.5-200 MG PO TABS: 1.0000 | ORAL_TABLET | Freq: Four times a day (QID) | ORAL | Status: DC | PRN

## 2013-06-30 MED ORDER — CLINDAMYCIN HCL 300 MG PO CAPS
300.0000 mg | ORAL_CAPSULE | Freq: Three times a day (TID) | ORAL | Status: DC
Start: 2013-06-30 — End: 2013-07-29

## 2013-06-30 NOTE — Progress Notes (Signed)
.   Subjective:     Leah Barry is a 27 y.o. female here for a problem exam.  Current complaints: vaginal pain for one week. Patient states that she is unable to open her vagina.  Personal health questionnaire reviewed: yes.   Gynecologic History Patient's last menstrual period was 06/11/2013. Contraception: tubal ligation Last Pap: 2012. Results were: abnormal - no follow up Last mammogram: N/A  Obstetric History OB History  Gravida Para Term Preterm AB SAB TAB Ectopic Multiple Living  4 4 4       4     # Outcome Date GA Lbr Len/2nd Weight Sex Delivery Anes PTL Lv  4 TRM      SVD   Y  3 TRM      SVD   Y  2 TRM      SVD   Y  1 TRM      SVD   Y       The following portions of the patient's history were reviewed and updated as appropriate: allergies, current medications, past family history, past medical history, past social history, past surgical history and problem list.  Review of Systems Pertinent items are noted in HPI.    Objective:    General appearance: alert and no distress Abdomen: abnormal findings:  moderate tenderness suprapubic Pelvic: cervix normal in appearance, external genitalia normal, no cervical motion tenderness, positive findings: tenderness of bilateral adnexa(e), uterus normal size, shape, and consistency and vagina normal without discharge    Assessment:    Chronic PID.   Plan:    Education reviewed: safe sex/STD prevention and PID. Follow up in: 2 weeks. Clindamycin Rx

## 2013-07-16 ENCOUNTER — Encounter: Payer: Medicaid Other | Admitting: Obstetrics

## 2013-07-29 ENCOUNTER — Encounter: Payer: Self-pay | Admitting: Obstetrics

## 2013-07-29 ENCOUNTER — Ambulatory Visit (INDEPENDENT_AMBULATORY_CARE_PROVIDER_SITE_OTHER): Payer: Medicaid Other | Admitting: Obstetrics

## 2013-07-29 ENCOUNTER — Ambulatory Visit: Payer: Medicaid Other | Admitting: Obstetrics

## 2013-07-29 VITALS — BP 131/85 | HR 66 | Temp 98.1°F | Ht 67.0 in | Wt 208.0 lb

## 2013-07-29 DIAGNOSIS — R300 Dysuria: Secondary | ICD-10-CM | POA: Insufficient documentation

## 2013-07-29 DIAGNOSIS — Z113 Encounter for screening for infections with a predominantly sexual mode of transmission: Secondary | ICD-10-CM

## 2013-07-29 DIAGNOSIS — N739 Female pelvic inflammatory disease, unspecified: Secondary | ICD-10-CM | POA: Insufficient documentation

## 2013-07-29 LAB — POCT URINALYSIS DIPSTICK
Bilirubin, UA: NEGATIVE
Glucose, UA: NEGATIVE
Ketones, UA: NEGATIVE
Leukocytes, UA: NEGATIVE
Nitrite, UA: NEGATIVE
Spec Grav, UA: >=1.03
Urobilinogen, UA: NEGATIVE
pH, UA: 5

## 2013-07-29 NOTE — Addendum Note (Signed)
Addended by: Glendell Docker on: 07/29/2013 03:18 PM   Modules accepted: Orders

## 2013-07-29 NOTE — Progress Notes (Signed)
Subjective: Interval History: has no complaint of urinary urgency and pain with urination for the past few days. She states she has taken aleve with no relief. She is also complaining of lower left groin and leg pain.  Objective: Afebrile, VSS PE:        Abdomen:  Soft, NT.      NEFG.      Vagina with menstrual blood.      Cervix without discharge      Uterus NSSC, NT.      Adnexa negative.  A/P:  Normal menstruating exam.  H/O PID.  Resolved clinically.  Finish course of antibiotics.          F/U in 3 months.

## 2013-07-30 LAB — GC/CHLAMYDIA PROBE AMP
CT Probe RNA: NEGATIVE
GC Probe RNA: NEGATIVE

## 2013-07-31 LAB — URINE CULTURE
Colony Count: NO GROWTH
Organism ID, Bacteria: NO GROWTH

## 2013-09-01 ENCOUNTER — Encounter: Payer: Self-pay | Admitting: Obstetrics

## 2013-09-10 ENCOUNTER — Ambulatory Visit (INDEPENDENT_AMBULATORY_CARE_PROVIDER_SITE_OTHER): Payer: Medicaid Other | Admitting: Obstetrics

## 2013-09-10 ENCOUNTER — Ambulatory Visit (INDEPENDENT_AMBULATORY_CARE_PROVIDER_SITE_OTHER): Payer: Medicaid Other

## 2013-09-10 ENCOUNTER — Encounter: Payer: Self-pay | Admitting: Obstetrics

## 2013-09-10 VITALS — BP 127/83 | HR 73 | Temp 98.1°F | Ht 67.0 in | Wt 205.0 lb

## 2013-09-10 DIAGNOSIS — N926 Irregular menstruation, unspecified: Secondary | ICD-10-CM

## 2013-09-10 DIAGNOSIS — N939 Abnormal uterine and vaginal bleeding, unspecified: Secondary | ICD-10-CM

## 2013-09-10 DIAGNOSIS — N739 Female pelvic inflammatory disease, unspecified: Secondary | ICD-10-CM

## 2013-09-10 DIAGNOSIS — N949 Unspecified condition associated with female genital organs and menstrual cycle: Secondary | ICD-10-CM

## 2013-09-10 LAB — POCT URINALYSIS DIPSTICK
Blood, UA: NEGATIVE
Glucose, UA: NEGATIVE
Ketones, UA: NEGATIVE
Leukocytes, UA: NEGATIVE
Nitrite, UA: NEGATIVE
Spec Grav, UA: 1.025
pH, UA: 5

## 2013-09-10 MED ORDER — OXYCODONE HCL 10 MG PO TABS: 10.0000 mg | ORAL_TABLET | Freq: Four times a day (QID) | ORAL | Status: DC | PRN

## 2013-09-10 MED ORDER — LEVOFLOXACIN 750 MG PO TABS: 750.0000 mg | ORAL_TABLET | Freq: Every day | ORAL | Status: DC

## 2013-09-10 NOTE — Progress Notes (Signed)
Subjective:     Leah Barry is a 27 y.o. female here for a routine exam.  Current complaints: patient c/o discharge with odor and pelvic pain. Patient states she has AUB with intercourse. Patient is also having urinary symptoms..  Personal health questionnaire reviewed: yes.   Gynecologic History Patient's last menstrual period was 08/15/2013. Contraception: tubal ligation   Obstetric History OB History  Gravida Para Term Preterm AB SAB TAB Ectopic Multiple Living  4 4 4       4     # Outcome Date GA Lbr Len/2nd Weight Sex Delivery Anes PTL Lv  4 TRM      SVD   Y  3 TRM      SVD   Y  2 TRM      SVD   Y  1 TRM      SVD   Y       The following portions of the patient's history were reviewed and updated as appropriate: allergies, current medications, past family history, past medical history, past social history, past surgical history and problem list.  Review of Systems Pertinent items are noted in HPI.    Objective:    General appearance: alert and no distress Abdomen: normal findings: soft, non-tender Pelvic: cervix normal in appearance, external genitalia normal, no cervical motion tenderness, positive findings: positive whiff test, tenderness of left adnexa(e), vaginal discharge:  thin, mucoid and malodorous or vaginal pH > 4.5, uterus normal size, shape, and consistency and no adnexal masses appreciated.    Assessment:    Left adnexal pain.  Probable PID.   Plan:    Follow up in: 2 weeks. Ultrasound ordered. Levaquin Rx.

## 2013-09-11 LAB — WET PREP BY MOLECULAR PROBE
Candida species: NEGATIVE
Gardnerella vaginalis: POSITIVE — AB
Trichomonas vaginosis: NEGATIVE

## 2013-09-11 LAB — GC/CHLAMYDIA PROBE AMP
CT Probe RNA: NEGATIVE
GC Probe RNA: NEGATIVE

## 2013-09-12 ENCOUNTER — Telehealth: Payer: Self-pay | Admitting: *Deleted

## 2013-09-12 MED ORDER — METRONIDAZOLE 0.75 % VA GEL: 1.0000 | Freq: Two times a day (BID) | VAGINAL | Status: DC

## 2013-09-12 NOTE — Telephone Encounter (Signed)
Message copied by Glendell Docker on Fri Sep 12, 2013  2:28 PM ------      Message from: Coral Ceo A      Created: Fri Sep 12, 2013  6:44 AM       Metro Gel 1 applicatoful intravaginally bid x 5 days. ------

## 2013-09-12 NOTE — Telephone Encounter (Signed)
Call placed to patient, no answer. A detailed voice message was left informing patient of lab results  per Dr Clearance Coots instructions of rx to pharmacy. A voice message was advising patient to call office if any questions.

## 2013-09-15 ENCOUNTER — Encounter: Payer: Self-pay | Admitting: Obstetrics

## 2013-09-25 ENCOUNTER — Ambulatory Visit: Payer: Medicaid Other | Admitting: Obstetrics

## 2013-09-26 ENCOUNTER — Ambulatory Visit (INDEPENDENT_AMBULATORY_CARE_PROVIDER_SITE_OTHER): Payer: Medicaid Other | Admitting: Obstetrics

## 2013-09-26 ENCOUNTER — Encounter: Payer: Self-pay | Admitting: Obstetrics

## 2013-09-26 VITALS — BP 132/70 | HR 76 | Temp 98.4°F | Ht 67.0 in | Wt 202.6 lb

## 2013-09-26 DIAGNOSIS — B373 Candidiasis of vulva and vagina: Secondary | ICD-10-CM

## 2013-09-26 DIAGNOSIS — B3731 Acute candidiasis of vulva and vagina: Secondary | ICD-10-CM | POA: Insufficient documentation

## 2013-09-26 DIAGNOSIS — Z0000 Encounter for general adult medical examination without abnormal findings: Secondary | ICD-10-CM | POA: Insufficient documentation

## 2013-09-26 DIAGNOSIS — N739 Female pelvic inflammatory disease, unspecified: Secondary | ICD-10-CM

## 2013-09-26 LAB — POCT URINALYSIS DIPSTICK
Bilirubin, UA: NEGATIVE
Glucose, UA: NEGATIVE
Ketones, UA: NEGATIVE
Leukocytes, UA: NEGATIVE
Nitrite, UA: NEGATIVE
Protein, UA: NEGATIVE
Spec Grav, UA: 1.025
Urobilinogen, UA: NEGATIVE
pH, UA: 5

## 2013-09-26 MED ORDER — FLUCONAZOLE 100 MG PO TABS: ORAL_TABLET | ORAL | Status: DC

## 2013-09-26 NOTE — Progress Notes (Signed)
.   Subjective:     Leah Barry is a 27 y.o. female here for a routine exam for follow up of PID, and pap smear. Current complaints:Patient states that she recently finished taking an antibiotic for BV and now has vaginal itching.  Personal health questionnaire reviewed: yes.   Gynecologic History Patient's last menstrual period was 09/21/2013. Contraception: none - tubal ligation Last Pap:2007 . Results were: abnormal Last mammogram: N/A  Obstetric History OB History  Gravida Para Term Preterm AB SAB TAB Ectopic Multiple Living  4 4 4       4     # Outcome Date GA Lbr Len/2nd Weight Sex Delivery Anes PTL Lv  4 TRM      SVD   Y  3 TRM      SVD   Y  2 TRM      SVD   Y  1 TRM      SVD   Y       The following portions of the patient's history were reviewed and updated as appropriate: allergies, current medications, past family history, past medical history, past social history, past surgical history and problem list.  Review of Systems Pertinent items are noted in HPI.    Objective:    General appearance: alert and no distress Abdomen: normal findings: soft, non-tender Pelvic: cervix normal in appearance, external genitalia normal, no adnexal masses or tenderness, no cervical motion tenderness, rectovaginal septum normal, uterus normal size, shape, and consistency and vagina normal without discharge    Assessment:    Healthy female exam.   H/O PID.  Resolved.  Yeast vaginitis.   Plan:    Education reviewed: safe sex/STD prevention. Follow up in: 3 months. Diflucan Rx

## 2013-09-27 LAB — WET PREP BY MOLECULAR PROBE
Candida species: NEGATIVE
Gardnerella vaginalis: NEGATIVE
Trichomonas vaginosis: NEGATIVE

## 2013-09-29 LAB — PAP IG W/ RFLX HPV ASCU

## 2013-09-29 LAB — GC/CHLAMYDIA PROBE AMP
CT Probe RNA: POSITIVE — AB
GC Probe RNA: NEGATIVE

## 2013-09-30 ENCOUNTER — Other Ambulatory Visit: Payer: Self-pay | Admitting: *Deleted

## 2013-09-30 DIAGNOSIS — R110 Nausea: Secondary | ICD-10-CM

## 2013-09-30 DIAGNOSIS — A749 Chlamydial infection, unspecified: Secondary | ICD-10-CM

## 2013-09-30 DIAGNOSIS — N739 Female pelvic inflammatory disease, unspecified: Secondary | ICD-10-CM

## 2013-09-30 MED ORDER — AZITHROMYCIN 250 MG PO TABS: 1000.0000 mg | ORAL_TABLET | Freq: Once | ORAL | Status: DC

## 2013-09-30 MED ORDER — ONDANSETRON 8 MG PO TBDP: 8.0000 mg | ORAL_TABLET | Freq: Three times a day (TID) | ORAL | Status: DC | PRN

## 2013-09-30 MED ORDER — LEVOFLOXACIN 750 MG PO TABS: 750.0000 mg | ORAL_TABLET | Freq: Every day | ORAL | Status: DC

## 2013-10-02 ENCOUNTER — Encounter: Payer: Self-pay | Admitting: Obstetrics

## 2013-10-03 ENCOUNTER — Encounter: Payer: Self-pay | Admitting: Obstetrics

## 2013-10-06 ENCOUNTER — Other Ambulatory Visit: Payer: Self-pay | Admitting: Obstetrics

## 2013-10-14 ENCOUNTER — Encounter: Payer: Self-pay | Admitting: Obstetrics

## 2013-10-14 ENCOUNTER — Ambulatory Visit (INDEPENDENT_AMBULATORY_CARE_PROVIDER_SITE_OTHER): Payer: Medicaid Other | Admitting: Obstetrics

## 2013-10-14 VITALS — BP 126/84 | HR 68 | Temp 98.0°F | Ht 67.0 in | Wt 204.0 lb

## 2013-10-14 DIAGNOSIS — N739 Female pelvic inflammatory disease, unspecified: Secondary | ICD-10-CM

## 2013-10-14 DIAGNOSIS — B373 Candidiasis of vulva and vagina: Secondary | ICD-10-CM

## 2013-10-14 DIAGNOSIS — A749 Chlamydial infection, unspecified: Secondary | ICD-10-CM

## 2013-10-14 MED ORDER — BUTOCONAZOLE NITRATE (1 DOSE) 2 % VA CREA: 1.0000 | TOPICAL_CREAM | Freq: Every evening | VAGINAL | Status: DC | PRN

## 2013-10-14 MED ORDER — FLUCONAZOLE 100 MG PO TABS: ORAL_TABLET | ORAL | Status: DC

## 2013-10-14 NOTE — Progress Notes (Signed)
Subjective:     Leah Barry is a 27 y.o. female here for a problem visit.  Current complaints: pt states that is still having vaginal discharge and irritation. Pt states that she is also having some pain and burning during urination.  Pt did test positive for Chlamydia on 09/26/13 and treatment was sent to pharmacy.  Pt states that she has had little relief with treatment.  Personal health questionnaire reviewed: yes.   Gynecologic History Patient's last menstrual period was 09/21/2013. Contraception: tubal ligation Last Pap: 09/26/2013. Results were: normal   Obstetric History OB History  Gravida Para Term Preterm AB SAB TAB Ectopic Multiple Living  4 4 4       4     # Outcome Date GA Lbr Len/2nd Weight Sex Delivery Anes PTL Lv  4 TRM      SVD   Y  3 TRM      SVD   Y  2 TRM      SVD   Y  1 TRM      SVD   Y       The following portions of the patient's history were reviewed and updated as appropriate: allergies, current medications, past family history, past medical history, past social history, past surgical history and problem list.  Review of Systems Pertinent items are noted in HPI.    Objective:    No exam performed today, Consult only..    Assessment:    Chronic PID.  Anxiety.   Plan:    Education reviewed: safe sex/STD prevention and management of chronic PID. Follow up in: 6 weeks. Continue Levaquin x 7-10 days. Diflucan Rx [employer] Gel

## 2013-10-29 ENCOUNTER — Inpatient Hospital Stay (HOSPITAL_COMMUNITY)
Admission: AD | Admit: 2013-10-29 | Discharge: 2013-10-29 | Disposition: A | Discharge status: Home / Self Care | Payer: Medicaid Other | Source: Ambulatory Visit | Attending: Obstetrics & Gynecology | Admitting: Obstetrics & Gynecology

## 2013-10-29 ENCOUNTER — Encounter (HOSPITAL_COMMUNITY): Payer: Self-pay | Admitting: Emergency Medicine

## 2013-10-29 ENCOUNTER — Other Ambulatory Visit (HOSPITAL_COMMUNITY)
Admission: RE | Admit: 2013-10-29 | Discharge: 2013-10-29 | Disposition: A | Discharge status: Home / Self Care | Payer: Medicaid Other | Source: Ambulatory Visit | Attending: Family Medicine | Admitting: Family Medicine

## 2013-10-29 ENCOUNTER — Inpatient Hospital Stay (HOSPITAL_COMMUNITY): Payer: Medicaid Other

## 2013-10-29 ENCOUNTER — Emergency Department (HOSPITAL_COMMUNITY)
Admission: EM | Admit: 2013-10-29 | Discharge: 2013-10-29 | Disposition: A | Discharge status: Hospice | Payer: Medicaid Other | Source: Home / Self Care | Attending: Family Medicine | Admitting: Family Medicine

## 2013-10-29 DIAGNOSIS — N949 Unspecified condition associated with female genital organs and menstrual cycle: Secondary | ICD-10-CM | POA: Insufficient documentation

## 2013-10-29 DIAGNOSIS — N739 Female pelvic inflammatory disease, unspecified: Secondary | ICD-10-CM | POA: Insufficient documentation

## 2013-10-29 DIAGNOSIS — N898 Other specified noninflammatory disorders of vagina: Secondary | ICD-10-CM | POA: Insufficient documentation

## 2013-10-29 DIAGNOSIS — N760 Acute vaginitis: Secondary | ICD-10-CM | POA: Insufficient documentation

## 2013-10-29 DIAGNOSIS — R109 Unspecified abdominal pain: Secondary | ICD-10-CM | POA: Insufficient documentation

## 2013-10-29 DIAGNOSIS — Z113 Encounter for screening for infections with a predominantly sexual mode of transmission: Secondary | ICD-10-CM | POA: Insufficient documentation

## 2013-10-29 DIAGNOSIS — R102 Pelvic and perineal pain: Secondary | ICD-10-CM

## 2013-10-29 DIAGNOSIS — Z8742 Personal history of other diseases of the female genital tract: Secondary | ICD-10-CM

## 2013-10-29 LAB — POCT URINALYSIS DIP (DEVICE)
Bilirubin Urine: NEGATIVE
Glucose, UA: NEGATIVE mg/dL
Hgb urine dipstick: NEGATIVE
Ketones, ur: NEGATIVE mg/dL
Leukocytes, UA: NEGATIVE
Nitrite: NEGATIVE
Protein, ur: NEGATIVE mg/dL
Specific Gravity, Urine: >=1.03 (ref 1.005–1.030)
Urobilinogen, UA: 0.2 mg/dL (ref 0.0–1.0)
pH: 5.5 (ref 5.0–8.0)

## 2013-10-29 LAB — CBC
HCT: 37.8 % (ref 36.0–46.0)
Hemoglobin: 13.5 g/dL (ref 12.0–15.0)
MCH: 26.3 pg (ref 26.0–34.0)
MCHC: 35.7 g/dL (ref 30.0–36.0)
MCV: 73.5 fL — ABNORMAL LOW (ref 78.0–100.0)
Platelets: 251 10*3/uL (ref 150–400)
RBC: 5.14 MIL/uL — ABNORMAL HIGH (ref 3.87–5.11)
RDW: 13.6 % (ref 11.5–15.5)
WBC: 9.3 10*3/uL (ref 4.0–10.5)

## 2013-10-29 LAB — POCT PREGNANCY, URINE: Preg Test, Ur: NEGATIVE

## 2013-10-29 MED ORDER — AZITHROMYCIN 250 MG PO TABS
1000.0000 mg | ORAL_TABLET | Freq: Once | ORAL | Status: AC
  Administered 2013-10-29: 1000 mg via ORAL
  Filled 2013-10-29: qty 4

## 2013-10-29 MED ORDER — CEFTRIAXONE SODIUM 250 MG IJ SOLR
250.0000 mg | Freq: Once | INTRAMUSCULAR | Status: AC
  Administered 2013-10-29: 250 mg via INTRAMUSCULAR
  Filled 2013-10-29: qty 250

## 2013-10-29 MED ORDER — TRAMADOL HCL 50 MG PO TABS
100.0000 mg | ORAL_TABLET | Freq: Once | ORAL | Status: AC
  Administered 2013-10-29: 100 mg via ORAL
  Filled 2013-10-29: qty 2

## 2013-10-29 MED ORDER — TRAMADOL HCL 50 MG PO TABS: 50.0000 mg | ORAL_TABLET | Freq: Four times a day (QID) | ORAL | Status: DC | PRN

## 2013-10-29 NOTE — ED Notes (Signed)
C/o abd pain States she does have chlamydia and has been taking antibiotics for 2-3 weeks States she has seen some bleeding

## 2013-10-29 NOTE — MAU Note (Signed)
Patient states she has been having left abdominal pain for several days and a vaginal discharge for about one week. Patient was seen at [facility] and sent to MAU for further evaluation.

## 2013-10-29 NOTE — MAU Note (Signed)
Updated patient about current wait. Pt sent to waiting room until ultrasound ready.

## 2013-10-29 NOTE — ED Provider Notes (Signed)
CSN: [id number]     Arrival date & time 10/29/13  1555 History   None    Chief Complaint  Patient presents with  . Abdominal Pain   (Consider location/radiation/quality/duration/timing/severity/associated sxs/prior Treatment) HPI Comments: Patient presents to [facility] complaints of vaginal discharge that began on 10/25/2013 along with vaginal spotting. Her symptoms then progressed to vaginal "burning" and dysuria. She has developed pelvic and abdominal pain over the past 24-36 hours without associated fever, nausea, vomiting or diarrhea. Denies hematuria or flank pain. Reports that she is currently being treated by her Ob/GYN (Dr. Aron Baba) for PID (chlamydia).   The history is provided by the patient.    Past Medical History  Diagnosis Date  . Hypertension   . Anxiety   . Irritable bowel   . Bipolar 1 disorder   . Apnea, sleep   . PID (acute pelvic inflammatory disease)   . PID (pelvic inflammatory disease)   . Back ache   . Herpes infection    Past Surgical History  Procedure Laterality Date  . Ovarian cyst removal    . Tubal ligation     Family History  Problem Relation Age of Onset  . Heart disease Mother   . Cancer Maternal Grandmother     breast  . Cancer Paternal Grandmother     breast   History  Substance Use Topics  . Smoking status: Current Every Day Smoker -- 0.25 packs/day for 5 years    Types: Cigarettes  . Smokeless tobacco: Never Used  . Alcohol Use: No     Comment: occasional drinker of all, but not in about a month   OB History   Grav Para Term Preterm Abortions TAB SAB Ect Mult Living   4 4 4       4      Review of Systems  Constitutional: Negative for fever, chills and fatigue.  HENT: Negative.   Eyes: Negative.   Respiratory: Negative.   Cardiovascular: Negative.   Gastrointestinal: Positive for abdominal pain and constipation. Negative for nausea, vomiting, diarrhea, abdominal distention and rectal pain.  Endocrine: Negative for  polydipsia, polyphagia and polyuria.  Genitourinary: Positive for dysuria, vaginal bleeding, vaginal discharge, vaginal pain and pelvic pain. Negative for urgency, frequency, hematuria, flank pain and genital sores.  Musculoskeletal: Negative.   Skin: Negative.   Allergic/Immunologic: Negative for immunocompromised state.  Neurological: Negative.   Psychiatric/Behavioral: Negative.     Allergies  Sulfa antibiotics; Flagyl; and Naproxen  Home Medications   Current Outpatient Rx  Name  Route  Sig  Dispense  Refill  . azithromycin (ZITHROMAX) 250 MG tablet   Oral   Take 4 tablets (1,000 mg total) by mouth once.   6 tablet   0   . Butoconazole Nitrate, 1 Dose, 2 % CREA   Vaginal   Place 1 Applicatorful vaginally at bedtime and may repeat dose one time if needed.   5.8 g   2   . fluconazole (DIFLUCAN) 100 MG tablet      Take 1 tab po every other day.   3 tablet   2   . HYDROcodone-ibuprofen (VICOPROFEN) 7.5-200 MG per tablet   Oral   Take 1 tablet by mouth every 6 (six) hours as needed for pain.   40 tablet   2   . levofloxacin (LEVAQUIN) 750 MG tablet   Oral   Take 1 tablet (750 mg total) by mouth daily.   14 tablet   1   . METROGEL  VAGINAL 0.75 % vaginal gel      insert 1 applicatorful vaginally twice a day for 5 days   70 g   0   . ondansetron (ZOFRAN ODT) 8 MG disintegrating tablet   Oral   Take 1 tablet (8 mg total) by mouth every 8 (eight) hours as needed for nausea or vomiting.   20 tablet   0   . Oxycodone HCl 10 MG TABS   Oral   Take 1 tablet (10 mg total) by mouth every 6 (six) hours as needed.   40 tablet   0    BP 114/76  Pulse 82  Temp(Src) 97.8 F (36.6 C) (Oral)  Resp 18  SpO2 98%  LMP 09/25/2013 Physical Exam  Nursing note and vitals reviewed. Constitutional: She is oriented to person, place, and time. She appears well-developed and well-nourished. No distress.  +obese  HENT:  Head: Normocephalic and atraumatic.  Eyes:  Conjunctivae are normal. No scleral icterus.  Cardiovascular: Normal rate, regular rhythm and normal heart sounds.   Pulmonary/Chest: Effort normal and breath sounds normal. No respiratory distress.  Abdominal: Soft. Bowel sounds are normal. She exhibits no distension and no mass. There is tenderness. There is no rebound and no guarding. Hernia confirmed negative in the right inguinal area and confirmed negative in the left inguinal area.  Genitourinary: Pelvic exam was performed with patient supine. There is no rash, tenderness, lesion or injury on the right labia. There is no rash, tenderness, lesion or injury on the left labia. Uterus is tender. Uterus is not deviated, not enlarged and not fixed. Cervix exhibits motion tenderness, discharge and friability. Right adnexum displays fullness. Right adnexum displays no mass and no tenderness. Left adnexum displays tenderness. Left adnexum displays no mass and no fullness. There is tenderness around the vagina. No erythema or bleeding around the vagina. No foreign body around the vagina. No signs of injury around the vagina. Vaginal discharge found.  Musculoskeletal: Normal range of motion.  Lymphadenopathy:       Right: No inguinal adenopathy present.       Left: No inguinal adenopathy present.  Neurological: She is alert and oriented to person, place, and time.  Skin: Skin is warm and dry.  Psychiatric: She has a normal mood and affect. Her behavior is normal.    ED Course  Procedures (including critical care time) Labs Review Labs Reviewed - No data to display Imaging Review No results found.  EKG Interpretation    Date/Time:    Ventricular Rate:    PR Interval:    QRS Duration:   QT Interval:    QTC Calculation:   R Axis:     Text Interpretation:              MDM  Severe left adnexal tenderness with bimanual exam along with persistent vaginal discharge and vaginal spotting despite recent outpatient treatment for PID  (chlamydia). Concern is for possible tubo-ovarian abscess. Case discussed with Dr. Antionette Char who suggest patient be evaluated at [facility] for pelvic ultrasound.  Case also discussed with Dr. Artis Flock prior to transfer.    Jess Barters [place], [place] 10/29/13 [phone]

## 2013-10-29 NOTE — MAU Provider Note (Signed)
Chief Complaint: Abdominal Pain and Vaginal Discharge   First Provider Initiated Contact with Patient 10/29/13 2030     SUBJECTIVE HPI: Leah Barry is a 27 y.o. [phone] non-pregnant female who presents to [facility]U from [facility] for pelvic US after eval for LLQ pain and vaginal discharge x 2 days. Has Hx of similar pain Dx'd as Chlamydia/PID. Pain improved after ABX Tx, but never completely went away. See Urgent Care note and office notes from [facility].   Denies fever, chills.   Past Medical History  Diagnosis Date  . Hypertension   . Anxiety   . Irritable bowel   . Bipolar 1 disorder   . Apnea, sleep   . PID (acute pelvic inflammatory disease)   . PID (pelvic inflammatory disease)   . Back ache   . Herpes infection    OB History  Gravida Para Term Preterm AB SAB TAB Ectopic Multiple Living  4 4 4       4     # Outcome Date GA Lbr Len/2nd Weight Sex Delivery Anes PTL Lv  4 TRM      SVD   Y  3 TRM      SVD   Y  2 TRM      SVD   Y  1 TRM      SVD   Y     Past Surgical History  Procedure Laterality Date  . Ovarian cyst removal    . Tubal ligation     History   Social History  . Marital Status: Single    Spouse Name: N/A    Number of Children: N/A  . Years of Education: N/A   Occupational History  . Not on file.   Social History Main Topics  . Smoking status: Current Every Day Smoker -- 0.25 packs/day for 5 years    Types: Cigarettes  . Smokeless tobacco: Never Used  . Alcohol Use: No     Comment: occasional drinker of all, but not in about a month  . Drug Use: No  . Sexual Activity: No   Other Topics Concern  . Not on file   Social History Narrative  . No narrative on file   No current facility-administered medications on file prior to encounter.   Current Outpatient Prescriptions on File Prior to Encounter  Medication Sig Dispense Refill  . levofloxacin (LEVAQUIN) 750 MG tablet Take 1 tablet (750 mg total) by mouth daily.  14 tablet  1  .  ondansetron (ZOFRAN ODT) 8 MG disintegrating tablet Take 1 tablet (8 mg total) by mouth every 8 (eight) hours as needed for nausea or vomiting.  20 tablet  0   Allergies  Allergen Reactions  . Sulfa Antibiotics Shortness Of Breath and Swelling    Eyes swell shut  . Bactrim [Sulfamethoxazole-Tmp Ds] Swelling    Swelling is of the eyes.  . Flagyl [Metronidazole] Nausea And Vomiting  . Naproxen Other (See Comments)    Nose bleeds    ROS: Pertinent items in HPI  OBJECTIVE Blood pressure 115/73, pulse 74, temperature 98 F (36.7 C), temperature source Oral, resp. rate 20, height 5\' 6"  (1.676 m), weight 91.808 kg (202 lb 6.4 oz), last menstrual period 09/25/2013. GENERAL: Well-developed, well-nourished female in no acute distress.  HEENT: Normocephalic HEART: normal rate RESP: normal effort ABDOMEN: Soft, mildly tender LLQ.  EXTREMITIES: Nontender, no edema NEURO: Alert and oriented SPECULUM EXAM: Deferred due to recent exam.  LAB RESULTS Results for orders placed  during the hospital encounter of 10/29/13 (from the past 24 hour(s))  CBC     Status: Abnormal   Collection Time    10/29/13  7:30 PM      Result Value Range   WBC 9.3  4.0 - 10.5 K/uL   RBC 5.14 (*) 3.87 - 5.11 MIL/uL   Hemoglobin 13.5  12.0 - 15.0 g/dL   HCT 16.1  09.6 - 04.5 %   MCV 73.5 (*) 78.0 - 100.0 fL   MCH 26.3  26.0 - 34.0 pg   MCHC 35.7  30.0 - 36.0 g/dL   RDW 40.9  81.1 - 91.4 %   Platelets 251  150 - 400 K/uL    IMAGING US Transvaginal Non-ob  10/29/2013   CLINICAL DATA:  Pelvic pain.  Spotting.  EXAM: TRANSABDOMINAL AND TRANSVAGINAL ULTRASOUND OF PELVIS  TECHNIQUE: Both transabdominal and transvaginal ultrasound examinations of the pelvis were performed. Transabdominal technique was performed for global imaging of the pelvis including uterus, ovaries, adnexal regions, and pelvic cul-de-sac. It was necessary to proceed with endovaginal exam following the transabdominal exam to visualize the uterus,  ovaries, and adnexa.  COMPARISON:  06/11/2013  FINDINGS: Uterus:  9.8 x 5.0 x 5.9 cm. Normal in morphology.  Endometrium:  Normal, 6 mm.  Right Ovary:  3.7 x 2.3 x 2.2 cm. Normal in morphology.  Left Ovary:  3.3 x 2.2 x 2.3 cm. Normal in morphology.  Other Findings:  No significant free fluid.  IMPRESSION: Normal pelvic ultrasound; no explanation for left-sided pain.   Electronically Signed   By: Jeronimo Greaves M.D.   On: 10/29/2013 20:03   US Pelvis Complete  10/29/2013   CLINICAL DATA:  Pelvic pain.  Spotting.  EXAM: TRANSABDOMINAL AND TRANSVAGINAL ULTRASOUND OF PELVIS  TECHNIQUE: Both transabdominal and transvaginal ultrasound examinations of the pelvis were performed. Transabdominal technique was performed for global imaging of the pelvis including uterus, ovaries, adnexal regions, and pelvic cul-de-sac. It was necessary to proceed with endovaginal exam following the transabdominal exam to visualize the uterus, ovaries, and adnexa.  COMPARISON:  06/11/2013  FINDINGS: Uterus:  9.8 x 5.0 x 5.9 cm. Normal in morphology.  Endometrium:  Normal, 6 mm.  Right Ovary:  3.7 x 2.3 x 2.2 cm. Normal in morphology.  Left Ovary:  3.3 x 2.2 x 2.3 cm. Normal in morphology.  Other Findings:  No significant free fluid.  IMPRESSION: Normal pelvic ultrasound; no explanation for left-sided pain.   Electronically Signed   By: Jeronimo Greaves M.D.   On: 10/29/2013 20:03    MAU COURSE Tx for presumptive PID per Dr. Tamela Oddi. Suspect chronic. Rocephin, Azithromycin, Ultram given.  ASSESSMENT 1. Pelvic pain in female   2. History of acute PID    PLAN Discharge home in stable condition.      Follow-up Information   Follow up with HARPER,CHARLES A, MD In 1 week. (or as needed if symptoms worsen)    Specialty:  Obstetrics and Gynecology   Contact information:   [address] [place] [address] [phone]       Follow up with [facility]. (As needed  if symptoms worsen)    Contact information:   [address] [id number] [place] [address] [phone]    Pelvic rest until pain resolves. Condoms.   Medication List    STOP taking these medications       levofloxacin 750 MG tablet  Commonly known as:  LEVAQUIN  TAKE these medications       Butoconazole Nitrate (1 Dose) 2 % Crea  Place 1 applicator vaginally at bedtime as needed and may repeat dose one time if needed.     ondansetron 8 MG disintegrating tablet  Commonly known as:  ZOFRAN ODT  Take 1 tablet (8 mg total) by mouth every 8 (eight) hours as needed for nausea or vomiting.     traMADol 50 MG tablet  Commonly known as:  ULTRAM  Take 1-2 tablets (50-100 mg total) by mouth every 6 (six) hours as needed.         [place], CNM 10/29/2013  9:00 PM

## 2013-10-30 NOTE — ED Provider Notes (Signed)
Medical screening examination/treatment/procedure(s) were performed by resident physician or non-physician practitioner and as supervising physician I was immediately available for consultation/collaboration.   Cortlynn Hollinsworth DOUGLAS MD.   [place] D Ellard Nan, MD 10/30/13 1740 

## 2013-10-31 ENCOUNTER — Telehealth (HOSPITAL_COMMUNITY): Payer: Self-pay | Admitting: Emergency Medicine

## 2013-10-31 NOTE — ED Notes (Addendum)
I called pt. back and told her I was still trying to get an order and would call her back. Vassie Moselle 10/31/2013 Order of Metrogel 0.75 % obtained from [facility]. Rx. called to the pharmacist @ [facility] on [address] @[phone]. I called pt. and told her the Rx. had been called to her pharmacy. 11/03/2013

## 2013-10-31 NOTE — ED Notes (Signed)
Pt. called for her lab results.  GC/Chlamydia neg., Affirm: Candida and Trich neg., Gardnerella pos.  Pt. verified x 2 and given results. I asked if she had symptoms.  Pt. said she has odor. Pt. said she can't take Flagyl and that Dr. Clearance Coots gives her Metrogel. She wants Rx. sent to [facility] on [address]  I told her I would ask the doctor. Discussed with Dr. Artis Flock and he said he would send the Rx. Vassie Moselle 10/31/2013

## 2013-12-09 ENCOUNTER — Other Ambulatory Visit (HOSPITAL_COMMUNITY): Payer: Self-pay | Admitting: Obstetrics and Gynecology

## 2013-12-16 ENCOUNTER — Emergency Department (HOSPITAL_COMMUNITY): Payer: Medicaid Other

## 2013-12-16 ENCOUNTER — Encounter (HOSPITAL_COMMUNITY): Payer: Self-pay | Admitting: Emergency Medicine

## 2013-12-16 ENCOUNTER — Emergency Department (HOSPITAL_COMMUNITY)
Admission: EM | Admit: 2013-12-16 | Discharge: 2013-12-16 | Disposition: A | Discharge status: Home / Self Care | Payer: Medicaid Other | Attending: Emergency Medicine | Admitting: Emergency Medicine

## 2013-12-16 DIAGNOSIS — R1032 Left lower quadrant pain: Secondary | ICD-10-CM | POA: Insufficient documentation

## 2013-12-16 DIAGNOSIS — N926 Irregular menstruation, unspecified: Secondary | ICD-10-CM | POA: Insufficient documentation

## 2013-12-16 DIAGNOSIS — N949 Unspecified condition associated with female genital organs and menstrual cycle: Secondary | ICD-10-CM | POA: Insufficient documentation

## 2013-12-16 DIAGNOSIS — R300 Dysuria: Secondary | ICD-10-CM | POA: Insufficient documentation

## 2013-12-16 DIAGNOSIS — R110 Nausea: Secondary | ICD-10-CM | POA: Insufficient documentation

## 2013-12-16 DIAGNOSIS — N939 Abnormal uterine and vaginal bleeding, unspecified: Secondary | ICD-10-CM | POA: Insufficient documentation

## 2013-12-16 DIAGNOSIS — Z8619 Personal history of other infectious and parasitic diseases: Secondary | ICD-10-CM | POA: Insufficient documentation

## 2013-12-16 DIAGNOSIS — I10 Essential (primary) hypertension: Secondary | ICD-10-CM | POA: Insufficient documentation

## 2013-12-16 DIAGNOSIS — R109 Unspecified abdominal pain: Secondary | ICD-10-CM

## 2013-12-16 DIAGNOSIS — R3915 Urgency of urination: Secondary | ICD-10-CM | POA: Insufficient documentation

## 2013-12-16 DIAGNOSIS — F17200 Nicotine dependence, unspecified, uncomplicated: Secondary | ICD-10-CM | POA: Insufficient documentation

## 2013-12-16 DIAGNOSIS — Z8719 Personal history of other diseases of the digestive system: Secondary | ICD-10-CM | POA: Insufficient documentation

## 2013-12-16 DIAGNOSIS — R350 Frequency of micturition: Secondary | ICD-10-CM | POA: Insufficient documentation

## 2013-12-16 DIAGNOSIS — Z8659 Personal history of other mental and behavioral disorders: Secondary | ICD-10-CM | POA: Insufficient documentation

## 2013-12-16 DIAGNOSIS — Z3202 Encounter for pregnancy test, result negative: Secondary | ICD-10-CM | POA: Insufficient documentation

## 2013-12-16 LAB — URINALYSIS, ROUTINE W REFLEX MICROSCOPIC
Bilirubin Urine: NEGATIVE
Glucose, UA: NEGATIVE mg/dL
Ketones, ur: 15 mg/dL — AB
Leukocytes, UA: NEGATIVE
Nitrite: NEGATIVE
Protein, ur: NEGATIVE mg/dL
Specific Gravity, Urine: 1.025 (ref 1.005–1.030)
Urobilinogen, UA: 0.2 mg/dL (ref 0.0–1.0)
pH: 5 (ref 5.0–8.0)

## 2013-12-16 LAB — URINE MICROSCOPIC-ADD ON

## 2013-12-16 LAB — PREGNANCY, URINE: Preg Test, Ur: NEGATIVE

## 2013-12-16 MED ORDER — KETOROLAC TROMETHAMINE 60 MG/2ML IM SOLN
60.0000 mg | Freq: Once | INTRAMUSCULAR | Status: AC
  Administered 2013-12-16: 60 mg via INTRAMUSCULAR
  Filled 2013-12-16: qty 2

## 2013-12-16 MED ORDER — ONDANSETRON 4 MG PO TBDP
4.0000 mg | ORAL_TABLET | Freq: Once | ORAL | Status: AC
Start: 2013-12-16 — End: 2013-12-16
  Administered 2013-12-16: 4 mg via ORAL
  Filled 2013-12-16: qty 1

## 2013-12-16 MED ORDER — TRAMADOL HCL 50 MG PO TABS
50.0000 mg | ORAL_TABLET | Freq: Once | ORAL | Status: AC
  Administered 2013-12-16: 50 mg via ORAL
  Filled 2013-12-16: qty 1

## 2013-12-16 NOTE — ED Notes (Signed)
Pt is agitated. Pt asking for ice chips. Pt educated as to why she may not have ice chips until tests come back.

## 2013-12-16 NOTE — ED Notes (Signed)
Pt stating to she would like to talk to charge nurse because she did not bring a jacket to the ED since she came in via [facility]. Pt given warm blanket and bus pass as something to use. Pt states she thinks the ED should provide her a jacket and states she should have gone to [facility].

## 2013-12-16 NOTE — ED Notes (Signed)
Pt presents from home via [facility] with c/o left flank/LLQ pain and burning on urination and frequency/urgency on urination since yesterday. Pt reports she had a 1 day menses last week and that this is abnormal for her. Pt with hx tubal ligation. Pt ambulatory to restroom without issue.

## 2013-12-16 NOTE — Discharge Instructions (Signed)
Abdominal Pain, Adult  Many things can cause belly (abdominal) pain. Most times, the belly pain is not dangerous. Many cases of belly pain can be watched and treated at home.  HOME CARE   · Do not take medicines that help you go poop (laxatives) unless told to by your doctor.  · Only take medicine as told by your doctor.  · Eat or drink as told by your doctor. Your doctor will tell you if you should be on a special diet.  GET HELP IF:  · You do not know what is causing your belly pain.  · You have belly pain while you are sick to your stomach (nauseous) or have runny poop (diarrhea).  · You have pain while you pee or poop.  · Your belly pain wakes you up at night.  · You have belly pain that gets worse or better when you eat.  · You have belly pain that gets worse when you eat fatty foods.  GET HELP RIGHT AWAY IF:   · The pain does not go away within 2 hours.  · You have a fever.  · You keep throwing up (vomiting).  · The pain changes and is only in the right or left part of the belly.  · You have bloody or tarry looking poop.  MAKE SURE YOU:   · Understand these instructions.  · Will watch your condition.  · Will get help right away if you are not doing well or get worse.  Document Released: 04/17/2008 Document Revised: 08/20/2013 Document Reviewed: 07/09/2013  Exit[employer]® Patient Information ©2014 [employer].

## 2013-12-16 NOTE — ED Provider Notes (Signed)
CSN: [id number]     Arrival date & time 12/16/13  1204 History   First MD Initiated Contact with Patient 12/16/13 1214     Chief Complaint  Patient presents with  . Flank Pain   (Consider location/radiation/quality/duration/timing/severity/associated sxs/prior Treatment) HPI Pt is a 28yo female with hx of PID c/o left flank and LLQ pain with burning upon urination associated with increased frequency and urgency since yesterday.  Pain is constant, 10/10, aching and cramping.  States she took Aleve earlier today w/o relief. Pt states she has also had some nausea but no vomiting or diarrhea.  Denies vaginal discharge or pain. Denies concern for STDs.     Past Medical History  Diagnosis Date  . Hypertension   . Anxiety   . Irritable bowel   . Bipolar 1 disorder   . Apnea, sleep   . PID (acute pelvic inflammatory disease)   . PID (pelvic inflammatory disease)   . Back ache   . Herpes infection    Past Surgical History  Procedure Laterality Date  . Ovarian cyst removal    . Tubal ligation     Family History  Problem Relation Age of Onset  . Heart disease Mother   . Cancer Maternal Grandmother     breast  . Cancer Paternal Grandmother     breast   History  Substance Use Topics  . Smoking status: Current Every Day Smoker -- 0.25 packs/day for 5 years    Types: Cigarettes  . Smokeless tobacco: Never Used  . Alcohol Use: No     Comment: occasional drinker of all, but not in about a month   OB History   Grav Para Term Preterm Abortions TAB SAB Ect Mult Living   4 4 4       4      Review of Systems  Constitutional: Negative for fever and chills.  Gastrointestinal: Positive for nausea and abdominal pain. Negative for vomiting, diarrhea and constipation.  Genitourinary: Positive for dysuria, urgency, frequency, flank pain, menstrual problem and pelvic pain. Negative for hematuria, decreased urine volume, vaginal bleeding, vaginal discharge and vaginal pain.  Musculoskeletal:  Negative for back pain.  All other systems reviewed and are negative.    Allergies  Sulfa antibiotics; Bactrim; Flagyl; and Naproxen  Home Medications   Current Outpatient Rx  Name  Route  Sig  Dispense  Refill  . naproxen sodium (ANAPROX) 220 MG tablet   Oral   Take 440 mg by mouth daily as needed (pain).          BP 102/65  Pulse 82  Temp(Src) 98.7 F (37.1 C) (Oral)  Resp 18  Ht 5\' 7"  (1.702 m)  Wt 190 lb (86.183 kg)  BMI 29.75 kg/m2  SpO2 98%  LMP 12/09/2013 Physical Exam  Nursing note and vitals reviewed. Constitutional: She appears well-developed and well-nourished. No distress.  Pt lying in exam bed, appears well, non-toxic.  HENT:  Head: Normocephalic and atraumatic.  Eyes: Conjunctivae are normal. No scleral icterus.  Neck: Normal range of motion.  Cardiovascular: Normal rate, regular rhythm and normal heart sounds.   Pulmonary/Chest: Effort normal and breath sounds normal. No respiratory distress. She has no wheezes. She has no rales. She exhibits no tenderness.  Abdominal: Soft. Bowel sounds are normal. She exhibits no distension and no mass. There is tenderness ( left flank and LLQ). There is no rebound and no guarding.  Soft, non-distended. No CVAT  Musculoskeletal: Normal range of motion.  Neurological: She is  alert.  Skin: Skin is warm and dry. She is not diaphoretic.    ED Course  Procedures (including critical care time) Labs Review Labs Reviewed  URINALYSIS, ROUTINE W REFLEX MICROSCOPIC - Abnormal; Notable for the following:    Color, Urine AMBER (*)    APPearance TURBID (*)    Hgb urine dipstick LARGE (*)    Ketones, ur 15 (*)    All other components within normal limits  PREGNANCY, URINE  URINE MICROSCOPIC-ADD ON   Imaging Review Ct Abdomen Pelvis Wo Contrast  12/16/2013   CLINICAL DATA:  Left flank and left lower quadrant pain with urinary frequency  EXAM: CT ABDOMEN AND PELVIS WITHOUT CONTRAST  TECHNIQUE: Multidetector CT imaging of  the abdomen and pelvis was performed following the standard protocol without intravenous contrast.  COMPARISON:  US PELVIS COMPLETE dated 10/29/2013; CT ABD/PELV WO CM dated 01/31/2012  FINDINGS: The kidneys are normal in density and contour. There is no hydronephrosis. Faint sub-mm calcifications are present within the medullary regions but definite stones are not demonstrated. The perinephric fat is normal in density. Along the course of the ureters no abnormal calcifications are demonstrated. The partially distended urinary bladder is normal in appearance. There are phleboliths within the pelvis. The uterus and adnexal structures exhibit no acute abnormalities. The psoas musculature is normal in density and contour.  The liver, gallbladder, pancreas, spleen, partially distended stomach, and adrenal glands are normal in appearance. The caliber of the abdominal aorta is normal. There is no periaortic or pericaval lymphadenopathy. There are a few normal-sized mesenteric and retroperitoneal lymph nodes.  The unopacified loops of small and large bowel exhibit no evidence of ileus nor obstruction. There are no findings to suggest diverticulitis or other forms of colitis. A normal calibered, uninflamed-appearing appendix is demonstrated on images 61 through 73 of series 2. There is no evidence of ascites. There is no inguinal or significant umbilical hernia.  The lumbar vertebral bodies are preserved in height. The bony pelvis exhibits no lytic nor blastic lesion.  IMPRESSION: 1. No obstructing urinary tract stones are demonstrated. There may be sub mm stones within the kidneys. The ureters and urinary bladder appear normal. There is no evidence of a psoas abscess. 2. There is no acute hepatobiliary abnormality nor acute bowel abnormality. The uterus and adnexal structures appear normal. 3. There is no free fluid in the abdomen or pelvis. No intra-abdominal or pelvic lymphadenopathy is demonstrated. 4. There are no  findings to explain the patient's left flank discomfort.   Electronically Signed   By: David  [place]   On: 12/16/2013 14:35    EKG Interpretation   None       MDM   1. Left flank pain    Pt with hx of PID c/o left flank pain, dysuria, hematuria, frequency and urgency since yesterday.  Reviewed pt's medical records which showed hx of PID and pelivic U/S performed for left sided pain, imaging was unremarkable, no evidence for source of pt's pain at that time.  No hx of CT abd, no hx of renal stones.    UA: hematuria with ketones, otherwise, unremarkable, no evidence of UTI.    CT abd: unremarkable, no findings to explain pt's left flank discomfort  All labs/imaging/findings discussed with patient. All questions answered and concerns addressed. Will discharge pt home and have pt f/u with [facility] info provided. Also advised to f/u with her OB/GYN for continued left flank pain. Return precautions given. Pt verbalized understanding  and agreement with tx plan. Vitals: unremarkable. Discharged in stable condition.    Discussed pt with attending during ED encounter and agrees with plan.     Junius Finner, PA-C 12/16/13 2017

## 2013-12-16 NOTE — ED Notes (Signed)
Pt given another apple juice

## 2013-12-16 NOTE — ED Notes (Signed)
Pt given apple juice and a crackers.

## 2013-12-19 NOTE — ED Provider Notes (Signed)
Medical screening examination/treatment/procedure(s) were performed by non-physician practitioner and as supervising physician I was immediately available for consultation/collaboration.    Zymere Patlan L Kathrynn Backstrom, MD 12/19/13 1518 

## 2013-12-22 ENCOUNTER — Ambulatory Visit: Payer: Medicaid Other | Admitting: Obstetrics

## 2014-01-03 ENCOUNTER — Emergency Department (HOSPITAL_COMMUNITY)
Admission: EM | Admit: 2014-01-03 | Discharge: 2014-01-03 | Disposition: A | Discharge status: Home / Self Care | Payer: Medicaid Other | Attending: Emergency Medicine | Admitting: Emergency Medicine

## 2014-01-03 ENCOUNTER — Encounter (HOSPITAL_COMMUNITY): Payer: Self-pay | Admitting: Emergency Medicine

## 2014-01-03 DIAGNOSIS — N760 Acute vaginitis: Secondary | ICD-10-CM | POA: Insufficient documentation

## 2014-01-03 DIAGNOSIS — N390 Urinary tract infection, site not specified: Secondary | ICD-10-CM

## 2014-01-03 DIAGNOSIS — A499 Bacterial infection, unspecified: Secondary | ICD-10-CM | POA: Insufficient documentation

## 2014-01-03 DIAGNOSIS — I10 Essential (primary) hypertension: Secondary | ICD-10-CM | POA: Insufficient documentation

## 2014-01-03 DIAGNOSIS — Z8719 Personal history of other diseases of the digestive system: Secondary | ICD-10-CM | POA: Insufficient documentation

## 2014-01-03 DIAGNOSIS — B9689 Other specified bacterial agents as the cause of diseases classified elsewhere: Secondary | ICD-10-CM | POA: Insufficient documentation

## 2014-01-03 DIAGNOSIS — Z8659 Personal history of other mental and behavioral disorders: Secondary | ICD-10-CM | POA: Insufficient documentation

## 2014-01-03 DIAGNOSIS — N739 Female pelvic inflammatory disease, unspecified: Secondary | ICD-10-CM | POA: Insufficient documentation

## 2014-01-03 DIAGNOSIS — F17200 Nicotine dependence, unspecified, uncomplicated: Secondary | ICD-10-CM | POA: Insufficient documentation

## 2014-01-03 DIAGNOSIS — Z3202 Encounter for pregnancy test, result negative: Secondary | ICD-10-CM | POA: Insufficient documentation

## 2014-01-03 LAB — WET PREP, GENITAL
Trich, Wet Prep: NONE SEEN
Yeast Wet Prep HPF POC: NONE SEEN

## 2014-01-03 LAB — HIV ANTIBODY (ROUTINE TESTING W REFLEX): HIV: NONREACTIVE

## 2014-01-03 LAB — URINALYSIS, ROUTINE W REFLEX MICROSCOPIC
Glucose, UA: NEGATIVE mg/dL
Hgb urine dipstick: NEGATIVE
Ketones, ur: 15 mg/dL — AB
Nitrite: NEGATIVE
Protein, ur: NEGATIVE mg/dL
Specific Gravity, Urine: 1.036 — ABNORMAL HIGH (ref 1.005–1.030)
Urobilinogen, UA: 1 mg/dL (ref 0.0–1.0)
pH: 5 (ref 5.0–8.0)

## 2014-01-03 LAB — URINE MICROSCOPIC-ADD ON

## 2014-01-03 LAB — RPR: RPR Ser Ql: NONREACTIVE

## 2014-01-03 LAB — POC URINE PREG, ED: Preg Test, Ur: NEGATIVE

## 2014-01-03 MED ORDER — HYDROCODONE-ACETAMINOPHEN 7.5-325 MG/15ML PO SOLN: 15.0000 mL | Freq: Four times a day (QID) | ORAL | Status: DC | PRN

## 2014-01-03 MED ORDER — CEFTRIAXONE SODIUM 250 MG IJ SOLR
250.0000 mg | Freq: Once | INTRAMUSCULAR | Status: AC
  Administered 2014-01-03: 250 mg via INTRAMUSCULAR
  Filled 2014-01-03: qty 250

## 2014-01-03 MED ORDER — CLINDAMYCIN HCL 150 MG PO CAPS: 150.0000 mg | ORAL_CAPSULE | Freq: Four times a day (QID) | ORAL | Status: DC

## 2014-01-03 MED ORDER — LEVOFLOXACIN 500 MG PO TABS: 500.0000 mg | ORAL_TABLET | Freq: Every day | ORAL | Status: DC

## 2014-01-03 MED ORDER — FLUCONAZOLE 200 MG PO TABS: 200.0000 mg | ORAL_TABLET | Freq: Every day | ORAL | Status: AC

## 2014-01-03 MED ORDER — AZITHROMYCIN 1 G PO PACK
1.0000 g | PACK | Freq: Once | ORAL | Status: AC
  Administered 2014-01-03: 1 g via ORAL
  Filled 2014-01-03: qty 1

## 2014-01-03 NOTE — ED Notes (Signed)
Pt reports mouth, throat and vaginal area is burning really bad.  Pt reports pain for three days.  Pt reports white brown vaginal discharge.

## 2014-01-03 NOTE — Discharge Instructions (Signed)
Bacterial Vaginosis Bacterial vaginosis is a vaginal infection that occurs when the normal balance of bacteria in the vagina is disrupted. It results from an overgrowth of certain bacteria. This is the most common vaginal infection in women of childbearing age. Treatment is important to prevent complications, especially in pregnant women, as it can cause a premature delivery. CAUSES  Bacterial vaginosis is caused by an increase in harmful bacteria that are normally present in smaller amounts in the vagina. Several different kinds of bacteria can cause bacterial vaginosis. However, the reason that the condition develops is not fully understood. RISK FACTORS Certain activities or behaviors can put you at an increased risk of developing bacterial vaginosis, including:  Having a new sex partner or multiple sex partners.  Douching.  Using an intrauterine device (IUD) for contraception. Women do not get bacterial vaginosis from toilet seats, bedding, swimming pools, or contact with objects around them. SIGNS AND SYMPTOMS  Some women with bacterial vaginosis have no signs or symptoms. Common symptoms include:  Grey vaginal discharge.  A fishlike odor with discharge, especially after sexual intercourse.  Itching or burning of the vagina and vulva.  Burning or pain with urination. DIAGNOSIS  Your health care provider will take a medical history and examine the vagina for signs of bacterial vaginosis. A sample of vaginal fluid may be taken. Your health care provider will look at this sample under a microscope to check for bacteria and abnormal cells. A vaginal pH test may also be done.  TREATMENT  Bacterial vaginosis may be treated with antibiotic medicines. These may be given in the form of a pill or a vaginal cream. A second round of antibiotics may be prescribed if the condition comes back after treatment.  HOME CARE INSTRUCTIONS   Only take over-the-counter or prescription medicines as  directed by your health care provider.  If antibiotic medicine was prescribed, take it as directed. Make sure you finish it even if you start to feel better.  Do not have sex until treatment is completed.  Tell all sexual partners that you have a vaginal infection. They should see their health care provider and be treated if they have problems, such as a mild rash or itching.  Practice safe sex by using condoms and only having one sex partner. SEEK MEDICAL CARE IF:   Your symptoms are not improving after 3 days of treatment.  You have increased discharge or pain.  You have a fever. MAKE SURE YOU:   Understand these instructions.  Will watch your condition.  Will get help right away if you are not doing well or get worse. FOR MORE INFORMATION  Centers for Disease Control and Prevention, Division of STD Prevention: SolutionApps.co.zawww.cdc.gov/std American Sexual Health Association (ASHA): www.ashastd.org  Document Released: 10/30/2005 Document Revised: 08/20/2013 Document Reviewed: 06/11/2013 [facility] Patient Information 2014 [place], [place].  Cervicitis Cervicitis is a soreness and swelling (inflammation) of the cervix. Your cervix is located at the bottom of your uterus. It opens up to the vagina. CAUSES   Sexually transmitted infections (STIs).   Allergic reaction.   Medicines or birth control devices that are put in the vagina.   Injury to the cervix.   Bacterial infections.  RISK FACTORS You are at greater risk if you:  Have unprotected sexual intercourse.  Have sexual intercourse with many partners.  Began sexual intercourse at an early age.  Have a history of STIs. SYMPTOMS  There may be no symptoms. If symptoms occur, they may include:   Juanita CraverGrey,  white, yellow, or bad-smelling vaginal discharge.   Pain or itching of the area outside the vagina.   Painful sexual intercourse.   Lower abdominal or lower back pain, especially during intercourse.   Frequent  urination.   Abnormal vaginal bleeding between periods, after sexual intercourse, or after menopause.   Pressure or a heavy feeling in the pelvis.  DIAGNOSIS  Diagnosis is made after a pelvic exam. Other tests may include:   Examination of any discharge under a microscope (wet prep).   A Pap test.  TREATMENT  Treatment will depend on the cause of cervicitis. If it is caused by an STI, both you and your partner will need to be treated. Antibiotic medicines will be given.  HOME CARE INSTRUCTIONS   Do not have sexual intercourse until your health care provider says it is okay.   Do not have sexual intercourse until your partner has been treated, if your cervicitis is caused by an STI.   Take your antibiotics as directed. Finish them even if you start to feel better.  SEEK MEDICAL CARE IF:  Your symptoms come back.   You have a fever.  MAKE SURE YOU:   Understand these instructions.  Will watch your condition.  Will get help right away if you are not doing well or get worse. Document Released: 10/30/2005 Document Revised: 07/02/2013 Document Reviewed: 04/23/2013 [facility] Patient Information 2014 [place], [place].  Pelvic Inflammatory Disease Pelvic inflammatory disease (PID) refers to an infection in some or all of the female organs. The infection can be in the uterus, ovaries, fallopian tubes, or the surrounding tissues in the pelvis. PID can cause abdominal or pelvic pain that comes on suddenly (acute pelvic pain). PID is a serious infection because it can lead to lasting (chronic) pelvic pain or the inability to have children (infertile).  CAUSES  The infection is often caused by the normal bacteria found in the vaginal tissues. PID may also be caused by an infection that is spread during sexual contact. PID can also occur following:   The birth of a baby.   A miscarriage.   An abortion.   Major pelvic surgery.   The use of an intrauterine device (IUD).    A sexual assault.  RISK FACTORS Certain factors can put a person at higher risk for PID, such as:  Being younger than 25 years.  Being sexually active at [place] age.  Usingnonbarrier contraception.  Havingmultiple sexual partners.  Having sex with someone who has symptoms of a genital infection.  Using oral contraception. Other times, certain behaviors can increase the possibility of getting PID, such as:  Having sex during your period.  Using a vaginal douche.  Having an intrauterine device (IUD) in place. SYMPTOMS   Abdominal or pelvic pain.   Fever.   Chills.   Abnormal vaginal discharge.  Abnormal uterine bleeding.   Unusual pain shortly after finishing your period. DIAGNOSIS  Your caregiver will choose some of the following methods to make a diagnosis, such as:   Performinga physical exam and history. A pelvic exam typically reveals a very tender uterus and surrounding pelvis.   Ordering laboratory tests including a pregnancy test, blood tests, and urine test.  Orderingcultures of the vagina and cervix to check for a sexually transmitted infection (STI).  Performing an ultrasound.   Performing a laparoscopic procedure to look inside the pelvis.  TREATMENT   Antibiotic medicines may be prescribed and taken by mouth.   Sexual partners may be treated when  the infection is caused by a sexually transmitted disease (STD).   Hospitalization may be needed to give antibiotics intravenously.  Surgery may be needed, but this is rare. It may take weeks until you are completely well. If you are diagnosed with PID, you should also be checked for human immunodeficiency virus (HIV). HOME CARE INSTRUCTIONS   If given, take your antibiotics as directed. Finish the medicine even if you start to feel better.   Only take over-the-counter or prescription medicines for pain, discomfort, or fever as directed by your caregiver.   Do not have sexual  intercourse until treatment is completed or as directed by your caregiver. If PID is confirmed, your recent sexual partner(s) will need treatment.   Keep your follow-up appointments. SEEK MEDICAL CARE IF:   You have increased or abnormal vaginal discharge.   You need prescription medicine for your pain.   You vomit.   You cannot take your medicines.   Your partner has an STD.  SEEK IMMEDIATE MEDICAL CARE IF:   You have a fever.   You have increased abdominal or pelvic pain.   You have chills.   You have pain when you urinate.   You are not better after 72 hours following treatment.  MAKE SURE YOU:   Understand these instructions.  Will watch your condition.  Will get help right away if you are not doing well or get worse. Document Released: 10/30/2005 Document Revised: 02/24/2013 Document Reviewed: 10/26/2011 [facility] Patient Information 2014 [place], [place].

## 2014-01-03 NOTE — ED Provider Notes (Signed)
CSN: [id number]     Arrival date & time 01/03/14  1217 History   First MD Initiated Contact with Patient 01/03/14 1501     Chief Complaint  Patient presents with  . Vaginal Discharge     (Consider location/radiation/quality/duration/timing/severity/associated sxs/prior Treatment) HPI Comments: Patient is 28 year old female with PMHx of HTN, PID, Irritable bowel, Herpes who presents with a 3 day history of vaginal discharge and burning in her vagina and throat.  She reports unprotected intercourse with her boyfriend and having given him oral intercourse.  Patient denies abdominal pain, nausea, vomiting, fever, chills.  States though she has a history of Herpes this does not feel like this.  Patient is a 28 y.o. female presenting with vaginal discharge. The history is provided by the patient. No language interpreter was used.  Vaginal Discharge Quality:  Brown, malodorous, thick and white Severity:  Moderate Onset quality:  Gradual Duration:  3 days Timing:  Constant Progression:  Worsening Chronicity:  New Context: after urination   Relieved by:  Nothing Worsened by:  Nothing tried Ineffective treatments:  None tried Associated symptoms: dyspareunia, dysuria and vaginal itching   Associated symptoms: no abdominal pain, no fever, no genital lesions, no nausea, no rash, no urinary frequency, no urinary hesitancy, no urinary incontinence and no vomiting   Risk factors: PID, STI, STI exposure and unprotected sex   Risk factors: no endometriosis, no gynecological surgery and no new sexual partner     Past Medical History  Diagnosis Date  . Hypertension   . Anxiety   . Irritable bowel   . Bipolar 1 disorder   . Apnea, sleep   . PID (acute pelvic inflammatory disease)   . PID (pelvic inflammatory disease)   . Back ache   . Herpes infection    Past Surgical History  Procedure Laterality Date  . Ovarian cyst removal    . Tubal ligation     Family History  Problem Relation Age  of Onset  . Heart disease Mother   . Cancer Maternal Grandmother     breast  . Cancer Paternal Grandmother     breast   History  Substance Use Topics  . Smoking status: Current Every Day Smoker -- 0.25 packs/day for 5 years    Types: Cigarettes  . Smokeless tobacco: Never Used  . Alcohol Use: No     Comment: occasional drinker of all, but not in about a month   OB History   Grav Para Term Preterm Abortions TAB SAB Ect Mult Living   4 4 4       4      Review of Systems  Constitutional: Negative for fever.  Gastrointestinal: Negative for nausea, vomiting and abdominal pain.  Genitourinary: Positive for dysuria, vaginal discharge and dyspareunia. Negative for bladder incontinence and hesitancy.  All other systems reviewed and are negative.      Allergies  Sulfa antibiotics; Bactrim; and Flagyl  Home Medications   Current Outpatient Rx  Name  Route  Sig  Dispense  Refill  . ibuprofen (ADVIL,MOTRIN) 600 MG tablet   Oral   Take 600 mg by mouth every 6 (six) hours as needed for mild pain.          BP 118/78  Pulse 92  Temp(Src) 97.4 F (36.3 C) (Oral)  Resp 28  Ht 5\' 7"  (1.702 m)  Wt 192 lb (87.091 kg)  BMI 30.06 kg/m2  SpO2 100%  LMP 12/09/2013 Physical Exam  Nursing note and vitals  reviewed. Constitutional: She is oriented to person, place, and time. She appears well-developed and well-nourished. No distress.  HENT:  Head: Normocephalic and atraumatic.  Right Ear: External ear normal.  Left Ear: External ear normal.  Nose: Nose normal.  Mouth/Throat: No oropharyngeal exudate.  Mild posterior pharyngeal erythema  Eyes: Conjunctivae are normal. Pupils are equal, round, and reactive to light. No scleral icterus.  Neck: Normal range of motion. Neck supple.  Cardiovascular: Normal rate, regular rhythm and normal heart sounds.  Exam reveals no gallop and no friction rub.   No murmur heard. Pulmonary/Chest: Effort normal and breath sounds normal. No respiratory  distress. She has no wheezes. She has no rales. She exhibits no tenderness.  Abdominal: Soft. Bowel sounds are normal. She exhibits no distension. There is tenderness. There is no rebound and no guarding.    Genitourinary: Pelvic exam was performed with patient supine. There is tenderness on the right labia. There is no rash or lesion on the right labia. There is tenderness on the left labia. There is no rash or lesion on the left labia. Uterus is tender. Cervix exhibits motion tenderness and discharge. Cervix exhibits no friability. Right adnexum displays no mass, no tenderness and no fullness. Left adnexum displays tenderness. Left adnexum displays no mass and no fullness. There is tenderness around the vagina. No bleeding around the vagina. No foreign body around the vagina. Vaginal discharge found.  Musculoskeletal: Normal range of motion. She exhibits no edema and no tenderness.  Lymphadenopathy:    She has no cervical adenopathy.  Neurological: She is alert and oriented to person, place, and time. She exhibits normal muscle tone. Coordination normal.  Skin: Skin is warm and dry. No rash noted. No erythema. No pallor.  Psychiatric: She has a normal mood and affect. Her behavior is normal. Judgment and thought content normal.    ED Course  Procedures (including critical care time) Labs Review Labs Reviewed  GC/CHLAMYDIA PROBE AMP  WET PREP, GENITAL  URINALYSIS, ROUTINE W REFLEX MICROSCOPIC  RPR  HIV ANTIBODY (ROUTINE TESTING)  POC URINE PREG, ED   Imaging Review No results found.  EKG Interpretation   None      Results for orders placed during the hospital encounter of 01/03/14  WET PREP, GENITAL      Result Value Ref Range   Yeast Wet Prep HPF POC NONE SEEN  NONE SEEN   Trich, Wet Prep NONE SEEN  NONE SEEN   Clue Cells Wet Prep HPF POC RARE (*) NONE SEEN   WBC, Wet Prep HPF POC RARE (*) NONE SEEN  URINALYSIS, ROUTINE W REFLEX MICROSCOPIC      Result Value Ref Range    Color, Urine AMBER (*) YELLOW   APPearance CLOUDY (*) CLEAR   Specific Gravity, Urine 1.036 (*) 1.005 - 1.030   pH 5.0  5.0 - 8.0   Glucose, UA NEGATIVE  NEGATIVE mg/dL   Hgb urine dipstick NEGATIVE  NEGATIVE   Bilirubin Urine SMALL (*) NEGATIVE   Ketones, ur 15 (*) NEGATIVE mg/dL   Protein, ur NEGATIVE  NEGATIVE mg/dL   Urobilinogen, UA 1.0  0.0 - 1.0 mg/dL   Nitrite NEGATIVE  NEGATIVE   Leukocytes, UA MODERATE (*) NEGATIVE  URINE MICROSCOPIC-ADD ON      Result Value Ref Range   Squamous Epithelial / LPF RARE  RARE   WBC, UA 11-20  <3 WBC/hpf   RBC / HPF 0-2  <3 RBC/hpf   Bacteria, UA RARE  RARE  Urine-Other MUCOUS PRESENT    POC URINE PREG, ED      Result Value Ref Range   Preg Test, Ur NEGATIVE  NEGATIVE   Ct Abdomen Pelvis Wo Contrast  12/16/2013   CLINICAL DATA:  Left flank and left lower quadrant pain with urinary frequency  EXAM: CT ABDOMEN AND PELVIS WITHOUT CONTRAST  TECHNIQUE: Multidetector CT imaging of the abdomen and pelvis was performed following the standard protocol without intravenous contrast.  COMPARISON:  US PELVIS COMPLETE dated 10/29/2013; CT ABD/PELV WO CM dated 01/31/2012  FINDINGS: The kidneys are normal in density and contour. There is no hydronephrosis. Faint sub-mm calcifications are present within the medullary regions but definite stones are not demonstrated. The perinephric fat is normal in density. Along the course of the ureters no abnormal calcifications are demonstrated. The partially distended urinary bladder is normal in appearance. There are phleboliths within the pelvis. The uterus and adnexal structures exhibit no acute abnormalities. The psoas musculature is normal in density and contour.  The liver, gallbladder, pancreas, spleen, partially distended stomach, and adrenal glands are normal in appearance. The caliber of the abdominal aorta is normal. There is no periaortic or pericaval lymphadenopathy. There are a few normal-sized mesenteric and  retroperitoneal lymph nodes.  The unopacified loops of small and large bowel exhibit no evidence of ileus nor obstruction. There are no findings to suggest diverticulitis or other forms of colitis. A normal calibered, uninflamed-appearing appendix is demonstrated on images 61 through 73 of series 2. There is no evidence of ascites. There is no inguinal or significant umbilical hernia.  The lumbar vertebral bodies are preserved in height. The bony pelvis exhibits no lytic nor blastic lesion.  IMPRESSION: 1. No obstructing urinary tract stones are demonstrated. There may be sub mm stones within the kidneys. The ureters and urinary bladder appear normal. There is no evidence of a psoas abscess. 2. There is no acute hepatobiliary abnormality nor acute bowel abnormality. The uterus and adnexal structures appear normal. 3. There is no free fluid in the abdomen or pelvis. No intra-abdominal or pelvic lymphadenopathy is demonstrated. 4. There are no findings to explain the patient's left flank discomfort.   Electronically Signed   By: David  [place]   On: 12/16/2013 14:35    Medications  cefTRIAXone (ROCEPHIN) injection 250 mg (250 mg Intramuscular Given 01/03/14 1628)  azithromycin (ZITHROMAX) powder 1 g (1 g Oral Given 01/03/14 1628)     MDM   PID BV UTI  Patient here with vaginal discharge and pain as well as sore throat - she states that she has a history of PID and feels concerned that this may be the case.  No fever, chills, abdominal pain, will be getting boyfriend checked as well.   Izola Price Marisue Humble, [place] 01/03/14 1657

## 2014-01-04 NOTE — ED Provider Notes (Signed)
Medical screening examination/treatment/procedure(s) were performed by non-physician practitioner and as supervising physician I was immediately available for consultation/collaboration.   Reyan Helle M Harshith Pursell, MD 01/04/14 0240 

## 2014-01-05 LAB — GC/CHLAMYDIA PROBE AMP
CT Probe RNA: NEGATIVE
CT Probe RNA: NEGATIVE
GC Probe RNA: NEGATIVE
GC Probe RNA: NEGATIVE

## 2014-02-14 ENCOUNTER — Encounter (HOSPITAL_COMMUNITY): Payer: Self-pay | Admitting: *Deleted

## 2014-02-14 ENCOUNTER — Inpatient Hospital Stay (HOSPITAL_COMMUNITY)
Admission: AD | Admit: 2014-02-14 | Discharge: 2014-02-14 | Disposition: A | Discharge status: Home / Self Care | Payer: Medicaid Other | Source: Ambulatory Visit | Attending: Obstetrics & Gynecology | Admitting: Obstetrics & Gynecology

## 2014-02-14 DIAGNOSIS — B9689 Other specified bacterial agents as the cause of diseases classified elsewhere: Secondary | ICD-10-CM | POA: Insufficient documentation

## 2014-02-14 DIAGNOSIS — R0681 Apnea, not elsewhere classified: Secondary | ICD-10-CM | POA: Insufficient documentation

## 2014-02-14 DIAGNOSIS — K589 Irritable bowel syndrome without diarrhea: Secondary | ICD-10-CM | POA: Insufficient documentation

## 2014-02-14 DIAGNOSIS — A499 Bacterial infection, unspecified: Secondary | ICD-10-CM

## 2014-02-14 DIAGNOSIS — I10 Essential (primary) hypertension: Secondary | ICD-10-CM | POA: Insufficient documentation

## 2014-02-14 DIAGNOSIS — F17200 Nicotine dependence, unspecified, uncomplicated: Secondary | ICD-10-CM | POA: Insufficient documentation

## 2014-02-14 DIAGNOSIS — N760 Acute vaginitis: Secondary | ICD-10-CM | POA: Insufficient documentation

## 2014-02-14 LAB — URINALYSIS, ROUTINE W REFLEX MICROSCOPIC
Bilirubin Urine: NEGATIVE
Glucose, UA: NEGATIVE mg/dL
Hgb urine dipstick: NEGATIVE
Ketones, ur: NEGATIVE mg/dL
Leukocytes, UA: NEGATIVE
Nitrite: NEGATIVE
Protein, ur: NEGATIVE mg/dL
Specific Gravity, Urine: >1.03 — ABNORMAL HIGH (ref 1.005–1.030)
Urobilinogen, UA: 0.2 mg/dL (ref 0.0–1.0)
pH: 5.5 (ref 5.0–8.0)

## 2014-02-14 LAB — WET PREP, GENITAL
Trich, Wet Prep: NONE SEEN
Yeast Wet Prep HPF POC: NONE SEEN

## 2014-02-14 LAB — POCT PREGNANCY, URINE: Preg Test, Ur: NEGATIVE

## 2014-02-14 MED ORDER — IBUPROFEN 800 MG PO TABS
800.0000 mg | ORAL_TABLET | Freq: Once | ORAL | Status: AC
  Administered 2014-02-14: 800 mg via ORAL
  Filled 2014-02-14: qty 1

## 2014-02-14 MED ORDER — KETOROLAC TROMETHAMINE 10 MG PO TABS
10.0000 mg | ORAL_TABLET | Freq: Once | ORAL | Status: DC
  Filled 2014-02-14: qty 1

## 2014-02-14 NOTE — MAU Note (Signed)
Pt also states had fever of 102.2 at home. Took motrin at 1000 today.

## 2014-02-14 NOTE — Discharge Instructions (Signed)

## 2014-02-14 NOTE — MAU Provider Note (Signed)
Attestation of Attending Supervision of Advanced Practitioner (PA/CNM/NP): Evaluation and management procedures were performed by the Advanced Practitioner under my supervision and collaboration.  I have reviewed the Advanced Practitioner's note and chart, and I agree with the management and plan.  Zylee Marchiano S, MD [facility] Attending 02/14/2014 8:28 PM   

## 2014-02-14 NOTE — MAU Provider Note (Signed)
History     CSN: [id number]  Arrival date and time: 02/14/14 1820   None     No chief complaint on file.  HPI Comments: Leah Barry 28 y.o. [id number] presents to MAU with vaginal discharge with " fishy odor " that has been ongoing for one week. No change in partners. [facility] and was Dx with PID given Zithromax, Rocephin in spite of negative testing including negative CT of abdomen and pelvis. CT specifically state the uterus and adnexa are negative.      Past Medical History  Diagnosis Date  . Hypertension   . Anxiety   . Irritable bowel   . Bipolar 1 disorder   . Apnea, sleep   . PID (acute pelvic inflammatory disease)   . PID (pelvic inflammatory disease)   . Back ache   . Herpes infection     Past Surgical History  Procedure Laterality Date  . Ovarian cyst removal    . Tubal ligation      Family History  Problem Relation Age of Onset  . Heart disease Mother   . Cancer Maternal Grandmother     breast  . Cancer Paternal Grandmother     breast    History  Substance Use Topics  . Smoking status: Current Every Day Smoker -- 0.25 packs/day for 5 years    Types: Cigarettes  . Smokeless tobacco: Never Used  . Alcohol Use: No     Comment: occasional drinker of all, but not in about a month    Allergies:  Allergies  Allergen Reactions  . Sulfa Antibiotics Shortness Of Breath and Swelling    Eyes swell shut  . Bactrim [Sulfamethoxazole-Tmp Ds] Swelling    Swelling is of the eyes.  . Flagyl [Metronidazole] Nausea And Vomiting    Prescriptions prior to admission  Medication Sig Dispense Refill  . clindamycin (CLEOCIN) 150 MG capsule Take 1 capsule (150 mg total) by mouth every 6 (six) hours.  28 capsule  0  . HYDROcodone-acetaminophen (HYCET) 7.5-325 mg/15 ml solution Take 15 mLs by mouth 4 (four) times daily as needed for moderate pain.  120 mL  0  . ibuprofen (ADVIL,MOTRIN) 600 MG tablet Take 600 mg by mouth every 6 (six)  hours as needed for mild pain.      [name]. levofloxacin (LEVAQUIN) 500 MG tablet Take 1 tablet (500 mg total) by mouth daily.  7 tablet  0    Review of Systems  Constitutional: Negative.   HENT: Negative.   Eyes: Negative.   Respiratory: Negative.   Cardiovascular: Negative.   Gastrointestinal: Negative.   Genitourinary:       Fishy vaginal discharge  Musculoskeletal: Negative.   Skin: Negative.   Neurological: Negative.   Psychiatric/Behavioral: Negative.    Physical Exam   Blood pressure 136/80, pulse 83, temperature 98.3 F (36.8 C), temperature source Oral, resp. rate 18, height 5\' 7"  (1.702 m), weight 82.611 kg (182 lb 2 oz), last menstrual period 01/07/2014.  Physical Exam  Constitutional: She is oriented to person, place, and time. She appears well-developed and well-nourished.  HENT:  Head: Normocephalic and atraumatic.  Eyes: Pupils are equal, round, and reactive to light.  GI: Soft. Bowel sounds are normal. She exhibits no distension and no mass. There is no tenderness. There is no rebound and no guarding.  Genitourinary:  Genital: External: negative Vaginal:Small amount clear discharge Cervix:negative Bimanual:nontender   Musculoskeletal: Normal range of motion.  Neurological: She  is alert and oriented to person, place, and time.  Skin: Skin is warm.   Results for orders placed during the hospital encounter of 02/14/14 (from the past 24 hour(s))  URINALYSIS, ROUTINE W REFLEX MICROSCOPIC     Status: Abnormal   Collection Time    02/14/14  6:25 PM      Result Value Ref Range   Color, Urine YELLOW  YELLOW   APPearance CLEAR  CLEAR   Specific Gravity, Urine >1.030 (*) 1.005 - 1.030   pH 5.5  5.0 - 8.0   Glucose, UA NEGATIVE  NEGATIVE mg/dL   Hgb urine dipstick NEGATIVE  NEGATIVE   Bilirubin Urine NEGATIVE  NEGATIVE   Ketones, ur NEGATIVE  NEGATIVE mg/dL   Protein, ur NEGATIVE  NEGATIVE mg/dL   Urobilinogen, UA 0.2  0.0 - 1.0 mg/dL   Nitrite NEGATIVE  NEGATIVE    Leukocytes, UA NEGATIVE  NEGATIVE  POCT PREGNANCY, URINE     Status: None   Collection Time    02/14/14  6:33 PM      Result Value Ref Range   Preg Test, Ur NEGATIVE  NEGATIVE  WET PREP, GENITAL     Status: Abnormal   Collection Time    02/14/14  7:10 PM      Result Value Ref Range   Yeast Wet Prep HPF POC NONE SEEN  NONE SEEN   Trich, Wet Prep NONE SEEN  NONE SEEN   Clue Cells Wet Prep HPF POC FEW (*) NONE SEEN   WBC, Wet Prep HPF POC FEW (*) NONE SEEN    MAU Course  Procedures  MDM  Wet prep Toradol 10 mg po/ pt declined and asked for Motrin   Assessment and Plan   A: Bacterial vaginosis/ very mild  P: As patient is not able to tolerate Flagly advised to allow her body to clear this Follow up with Dr Clearance Coots as directed   Carolynn Serve 02/14/2014, 7:14 PM

## 2014-02-14 NOTE — MAU Note (Signed)
Here for pain and "fishy" odor. Hx of PID and BV. Denies any other complaints.

## 2014-02-14 NOTE — MAU Note (Signed)
[facility] and was treated for PID, UTI, and BV. Did not get better after taking all medications so Dr. Clearance CootsHarper told pt to come to MAU for eval if pain didn't get any better. Was hospitalized for PID in past year as well. Foul vaginal odor.

## 2014-02-16 ENCOUNTER — Telehealth (HOSPITAL_COMMUNITY): Payer: Self-pay | Admitting: Nurse Practitioner

## 2014-02-16 ENCOUNTER — Other Ambulatory Visit: Payer: Self-pay | Admitting: Family

## 2014-02-16 MED ORDER — CLINDAMYCIN PHOSPHATE 2 % VA CREA: 1.0000 | TOPICAL_CREAM | Freq: Every day | VAGINAL | Status: DC

## 2014-02-16 NOTE — Progress Notes (Signed)
Pt notified that clindamycin cream 2% has been sent to [facility] for pick-up.  Pt [facility]

## 2014-02-16 NOTE — Telephone Encounter (Signed)
Patient called to get her culture results.  GC & chlamydia culture was cancelled.  Patient was diagnosed with BV at time of visit.  Patient did want alternative drug called in since she cannot tolerate Flagyl.  Consulted with Sid FalconWalidah Muhammad, she agreed to give clindamycin.  Patient then requested to have pain medication called in.  Informed patient we could not call in pain medication that she would need to continue with the 600mg  Ibuprofen as instructed and [facility]

## 2014-02-20 ENCOUNTER — Encounter (HOSPITAL_COMMUNITY): Payer: Self-pay | Admitting: Emergency Medicine

## 2014-02-20 ENCOUNTER — Emergency Department (HOSPITAL_COMMUNITY): Payer: Medicaid Other

## 2014-02-20 ENCOUNTER — Emergency Department (HOSPITAL_COMMUNITY)
Admission: EM | Admit: 2014-02-20 | Discharge: 2014-02-20 | Disposition: A | Discharge status: Home / Self Care | Payer: Medicaid Other | Attending: Emergency Medicine | Admitting: Emergency Medicine

## 2014-02-20 DIAGNOSIS — Z8719 Personal history of other diseases of the digestive system: Secondary | ICD-10-CM | POA: Insufficient documentation

## 2014-02-20 DIAGNOSIS — S81812A Laceration without foreign body, left lower leg, initial encounter: Secondary | ICD-10-CM

## 2014-02-20 DIAGNOSIS — F17200 Nicotine dependence, unspecified, uncomplicated: Secondary | ICD-10-CM | POA: Insufficient documentation

## 2014-02-20 DIAGNOSIS — W268XXA Contact with other sharp object(s), not elsewhere classified, initial encounter: Secondary | ICD-10-CM | POA: Insufficient documentation

## 2014-02-20 DIAGNOSIS — Z8619 Personal history of other infectious and parasitic diseases: Secondary | ICD-10-CM | POA: Insufficient documentation

## 2014-02-20 DIAGNOSIS — Z8659 Personal history of other mental and behavioral disorders: Secondary | ICD-10-CM | POA: Insufficient documentation

## 2014-02-20 DIAGNOSIS — Y9302 Activity, running: Secondary | ICD-10-CM | POA: Insufficient documentation

## 2014-02-20 DIAGNOSIS — Z792 Long term (current) use of antibiotics: Secondary | ICD-10-CM | POA: Insufficient documentation

## 2014-02-20 DIAGNOSIS — S71009A Unspecified open wound, unspecified hip, initial encounter: Secondary | ICD-10-CM | POA: Insufficient documentation

## 2014-02-20 DIAGNOSIS — Y92009 Unspecified place in unspecified non-institutional (private) residence as the place of occurrence of the external cause: Secondary | ICD-10-CM | POA: Insufficient documentation

## 2014-02-20 DIAGNOSIS — Z8742 Personal history of other diseases of the female genital tract: Secondary | ICD-10-CM | POA: Insufficient documentation

## 2014-02-20 DIAGNOSIS — I10 Essential (primary) hypertension: Secondary | ICD-10-CM | POA: Insufficient documentation

## 2014-02-20 DIAGNOSIS — S71109A Unspecified open wound, unspecified thigh, initial encounter: Principal | ICD-10-CM | POA: Insufficient documentation

## 2014-02-20 MED ORDER — TRAMADOL HCL 50 MG PO TABS: 50.0000 mg | ORAL_TABLET | Freq: Four times a day (QID) | ORAL | Status: DC | PRN

## 2014-02-20 MED ORDER — HYDROCODONE-ACETAMINOPHEN 5-325 MG PO TABS
2.0000 | ORAL_TABLET | Freq: Once | ORAL | Status: AC
  Administered 2014-02-20: 2 via ORAL
  Filled 2014-02-20: qty 2

## 2014-02-20 NOTE — ED Notes (Signed)
Pt has a ride home.  

## 2014-02-20 NOTE — ED Provider Notes (Signed)
CSN: [id number]     Arrival date & time 02/20/14  1628 History  This chart was scribed for non-physician practitioner, Emilia Beck, PA-C working with Richardean Canal, MD by Luisa Dago, ED scribe. This patient was seen in room WTR5/WTR5 and the patient's care was started at 6:10 PM.   No chief complaint on file.  The history is provided by the patient. No language interpreter was used.   HPI Comments: Leah Barry is a 28 y.o. female who presents to the Emergency Department complaining of left thigh puncture wound that occurred about 12.5 hours ago. Pt was running through the house and hit the edge of some broken glass on a table. She thinks something might be in the wound. Pt states that she stopped the bleeding by tying a "scarf" around her thigh. She is complaining of associated pain. Denies any fever, chills, diaphoresis, congestion, abdominal pain, nausea, or emesis.   Pt does not recall her last tetanus vaccine.   Past Medical History  Diagnosis Date  . Hypertension   . Anxiety   . Irritable bowel   . Bipolar 1 disorder   . Apnea, sleep   . PID (acute pelvic inflammatory disease)   . PID (pelvic inflammatory disease)   . Back ache   . Herpes infection    Past Surgical History  Procedure Laterality Date  . Ovarian cyst removal    . Tubal ligation     Family History  Problem Relation Age of Onset  . Heart disease Mother   . Cancer Maternal Grandmother     breast  . Cancer Paternal Grandmother     breast   History  Substance Use Topics  . Smoking status: Current Every Day Smoker -- 0.25 packs/day for 5 years    Types: Cigarettes  . Smokeless tobacco: Never Used  . Alcohol Use: No     Comment: occasional drinker of all, but not in about a month   OB History   Grav Para Term Preterm Abortions TAB SAB Ect Mult Living   4 4 4       4      Review of Systems  Constitutional: Negative for fever and chills.  HENT: Negative for congestion.   Respiratory:  Negative for shortness of breath.   Gastrointestinal: Negative for nausea, vomiting and abdominal pain.  Skin: Positive for wound.  All other systems reviewed and are negative.  Allergies  Sulfa antibiotics; Bactrim; and Flagyl  Home Medications   Current Outpatient Rx  Name  Route  Sig  Dispense  Refill  . clindamycin (CLEOCIN) 2 % vaginal cream   Vaginal   Place 1 Applicatorful vaginally at bedtime.   40 g   0   . ibuprofen (ADVIL,MOTRIN) 600 MG tablet   Oral   Take 600 mg by mouth every 6 (six) hours as needed for mild pain.         [name] PRESCRIPTION MEDICATION   Oral   Take 1 tablet by mouth at bedtime. Pt states she is taking a prescription medication to treat depression; unknown drug/strength pharmacy closed at time.          BP 126/68  Pulse 89  Temp(Src) 98.3 F (36.8 C) (Oral)  Resp 16  SpO2 99%  LMP 02/09/2014  Physical Exam  Nursing note and vitals reviewed. Constitutional: She is oriented to person, place, and time. She appears well-developed and well-nourished. No distress.  HENT:  Head: Normocephalic and atraumatic.  Eyes: Conjunctivae and EOM  are normal. Right eye exhibits no discharge. Left eye exhibits no discharge.  Neck: Neck supple. No tracheal deviation present.  Cardiovascular: Normal rate, regular rhythm and normal heart sounds.  Exam reveals no gallop and no friction rub.   No murmur heard. Pulmonary/Chest: Effort normal and breath sounds normal. No respiratory distress.  Abdominal: Soft. She exhibits no distension. There is no tenderness.  Musculoskeletal: Normal range of motion. She exhibits no edema and no tenderness.  Neurological: She is alert and oriented to person, place, and time.  Skin: Skin is warm and dry.  1.0 cm x 1.0 cm wound of left anterior thigh. No bleeding. Tender to palpation. No obvious foreign bodies noted.  Psychiatric: She has a normal mood and affect. Her behavior is normal. Thought content normal.    ED Course   FOREIGN BODY REMOVAL Date/Time: 02/20/2014 7:25 PM Performed by: Emilia Beck Authorized by: Emilia Beck Consent: Verbal consent obtained. Consent given by: patient Patient understanding: patient states understanding of the procedure being performed Patient consent: the patient's understanding of the procedure matches consent given Patient identity confirmed: verbally with patient Body area: skin General location: lower extremity Location details: left upper leg Anesthesia: local infiltration Local anesthetic: lidocaine 2% with epinephrine Anesthetic total: 4 ml Patient sedated: no Patient restrained: no Patient cooperative: yes Localization method: visualized Removal mechanism: scalpel Dressing: dressing applied Tendon involvement: none Depth: subcutaneous Complexity: simple 0 objects recovered. Objects recovered: 0 Post-procedure assessment: foreign body not removed Patient tolerance: Patient tolerated the procedure well with no immediate complications.   (including critical care time)  LACERATION REPAIR Performed by: Emilia Beck Authorized by: Emilia Beck Consent: Verbal consent obtained. Risks and benefits: risks, benefits and alternatives were discussed Consent given by: patient Patient identity confirmed: provided demographic data Prepped and Draped in normal sterile fashion Wound explored  Laceration Location: left thigh   Laceration Length: 1.5 cm  No Foreign Bodies seen or palpated  Anesthesia: local infiltration  Local anesthetic: lidocaine 2% with epinephrine  Anesthetic total: 5 ml  Irrigation method: syringe Amount of cleaning: standard  Skin closure: 5-0 prolene  Number of sutures: 3  Technique: simple  Patient tolerance: Patient tolerated the procedure well with no immediate complications.    DIAGNOSTIC STUDIES: Oxygen Saturation is 99% on RA, normal by my interpretation.    COORDINATION OF CARE: 6:12 PM-  Pt is requesting medication for pain. Agreed to give pt some pain meds for pain control. Will apply antibiotic ointment to the wound. Pt advised of plan for treatment and pt agrees. Medications  HYDROcodone-acetaminophen (NORCO/VICODIN) 5-325 MG per tablet 2 tablet (not administered)    Labs Review Labs Reviewed - No data to display Imaging Review Dg Femur Left  02/20/2014   CLINICAL DATA:  Left femur pain and thigh  laceration.  EXAM: LEFT FEMUR - 2 VIEW  COMPARISON:  None.  FINDINGS: There is no evidence of fracture or other focal bone lesions. Soft tissues are unremarkable.  IMPRESSION: Normal left femur.   Electronically Signed   By: Roque Lias M.D.   On: 02/20/2014 18:37     EKG Interpretation None      MDM   Final diagnoses:  Laceration of left leg    7:24 PM Patient's xray shows no foreign body but patient believes there is a piece of glass in her leg. I attempted to remove the foreign body but did not find anything. Laceration repaired without difficulty. Patient UTD on tetanus. Vitals stable and patient afebrile. Patient instructed  to return to the ED with worsening or concerning symptoms.   I personally performed the services described in this documentation, which was scribed in my presence. The recorded information has been reviewed and is accurate.  Emilia BeckKaitlyn Talynn Barry, [place] JerseyPA-C 02/20/14 1929

## 2014-02-20 NOTE — ED Notes (Signed)
Pt has left leg puncture wound from a table that had broken glass on the edge while running through the house. Bleeding controlled. Says "i feel like something is still in there". Pt able to ambulate without difficulty.

## 2014-02-20 NOTE — Discharge Instructions (Signed)
Take Tramdol as needed for pain. Return to the ED in 5 days for suture removal. Return to the ED with worsening or concerning symptoms.

## 2014-02-21 NOTE — ED Provider Notes (Signed)
Medical screening examination/treatment/procedure(s) were performed by non-physician practitioner and as supervising physician I was immediately available for consultation/collaboration.   EKG Interpretation None        Lima Chillemi H Lanorris Kalisz, MD 02/21/14 1047 

## 2014-03-05 ENCOUNTER — Emergency Department (HOSPITAL_COMMUNITY): Payer: Medicaid Other

## 2014-03-05 ENCOUNTER — Emergency Department (HOSPITAL_COMMUNITY)
Admission: EM | Admit: 2014-03-05 | Discharge: 2014-03-05 | Disposition: A | Discharge status: Home / Self Care | Payer: Medicaid Other | Attending: Emergency Medicine | Admitting: Emergency Medicine

## 2014-03-05 ENCOUNTER — Encounter (HOSPITAL_COMMUNITY): Payer: Self-pay | Admitting: Emergency Medicine

## 2014-03-05 ENCOUNTER — Emergency Department (HOSPITAL_COMMUNITY)
Admission: EM | Admit: 2014-03-05 | Discharge: 2014-03-05 | Disposition: A | Discharge status: Home / Self Care | Payer: Medicaid Other | Source: Home / Self Care | Attending: Emergency Medicine | Admitting: Emergency Medicine

## 2014-03-05 DIAGNOSIS — Z79899 Other long term (current) drug therapy: Secondary | ICD-10-CM

## 2014-03-05 DIAGNOSIS — B009 Herpesviral infection, unspecified: Secondary | ICD-10-CM

## 2014-03-05 DIAGNOSIS — IMO0002 Reserved for concepts with insufficient information to code with codable children: Secondary | ICD-10-CM | POA: Insufficient documentation

## 2014-03-05 DIAGNOSIS — F319 Bipolar disorder, unspecified: Secondary | ICD-10-CM

## 2014-03-05 DIAGNOSIS — Z8669 Personal history of other diseases of the nervous system and sense organs: Secondary | ICD-10-CM | POA: Insufficient documentation

## 2014-03-05 DIAGNOSIS — S0101XA Laceration without foreign body of scalp, initial encounter: Secondary | ICD-10-CM

## 2014-03-05 DIAGNOSIS — Z8619 Personal history of other infectious and parasitic diseases: Secondary | ICD-10-CM | POA: Insufficient documentation

## 2014-03-05 DIAGNOSIS — Z791 Long term (current) use of non-steroidal anti-inflammatories (NSAID): Secondary | ICD-10-CM | POA: Insufficient documentation

## 2014-03-05 DIAGNOSIS — S0100XA Unspecified open wound of scalp, initial encounter: Secondary | ICD-10-CM | POA: Insufficient documentation

## 2014-03-05 DIAGNOSIS — Z792 Long term (current) use of antibiotics: Secondary | ICD-10-CM | POA: Insufficient documentation

## 2014-03-05 DIAGNOSIS — S199XXA Unspecified injury of neck, initial encounter: Secondary | ICD-10-CM

## 2014-03-05 DIAGNOSIS — M549 Dorsalgia, unspecified: Secondary | ICD-10-CM | POA: Insufficient documentation

## 2014-03-05 DIAGNOSIS — F17200 Nicotine dependence, unspecified, uncomplicated: Secondary | ICD-10-CM | POA: Insufficient documentation

## 2014-03-05 DIAGNOSIS — R110 Nausea: Secondary | ICD-10-CM

## 2014-03-05 DIAGNOSIS — F411 Generalized anxiety disorder: Secondary | ICD-10-CM | POA: Insufficient documentation

## 2014-03-05 DIAGNOSIS — N730 Acute parametritis and pelvic cellulitis: Secondary | ICD-10-CM | POA: Insufficient documentation

## 2014-03-05 DIAGNOSIS — I10 Essential (primary) hypertension: Secondary | ICD-10-CM

## 2014-03-05 DIAGNOSIS — Z882 Allergy status to sulfonamides status: Secondary | ICD-10-CM | POA: Insufficient documentation

## 2014-03-05 DIAGNOSIS — Z8742 Personal history of other diseases of the female genital tract: Secondary | ICD-10-CM | POA: Insufficient documentation

## 2014-03-05 DIAGNOSIS — S298XXA Other specified injuries of thorax, initial encounter: Secondary | ICD-10-CM | POA: Insufficient documentation

## 2014-03-05 DIAGNOSIS — Z888 Allergy status to other drugs, medicaments and biological substances status: Secondary | ICD-10-CM | POA: Insufficient documentation

## 2014-03-05 DIAGNOSIS — S0990XA Unspecified injury of head, initial encounter: Secondary | ICD-10-CM | POA: Insufficient documentation

## 2014-03-05 DIAGNOSIS — Z8719 Personal history of other diseases of the digestive system: Secondary | ICD-10-CM | POA: Insufficient documentation

## 2014-03-05 DIAGNOSIS — G4730 Sleep apnea, unspecified: Secondary | ICD-10-CM | POA: Insufficient documentation

## 2014-03-05 DIAGNOSIS — R51 Headache: Secondary | ICD-10-CM

## 2014-03-05 DIAGNOSIS — T7491XA Unspecified adult maltreatment, confirmed, initial encounter: Secondary | ICD-10-CM | POA: Insufficient documentation

## 2014-03-05 DIAGNOSIS — R209 Unspecified disturbances of skin sensation: Secondary | ICD-10-CM | POA: Insufficient documentation

## 2014-03-05 DIAGNOSIS — K589 Irritable bowel syndrome without diarrhea: Secondary | ICD-10-CM | POA: Insufficient documentation

## 2014-03-05 DIAGNOSIS — Z881 Allergy status to other antibiotic agents status: Secondary | ICD-10-CM

## 2014-03-05 DIAGNOSIS — N739 Female pelvic inflammatory disease, unspecified: Secondary | ICD-10-CM | POA: Insufficient documentation

## 2014-03-05 DIAGNOSIS — T7492XA Unspecified child maltreatment, confirmed, initial encounter: Secondary | ICD-10-CM | POA: Insufficient documentation

## 2014-03-05 DIAGNOSIS — S060X0A Concussion without loss of consciousness, initial encounter: Secondary | ICD-10-CM | POA: Insufficient documentation

## 2014-03-05 DIAGNOSIS — S060X9A Concussion with loss of consciousness of unspecified duration, initial encounter: Secondary | ICD-10-CM

## 2014-03-05 DIAGNOSIS — S0993XA Unspecified injury of face, initial encounter: Secondary | ICD-10-CM | POA: Insufficient documentation

## 2014-03-05 DIAGNOSIS — S060XAA Concussion with loss of consciousness status unknown, initial encounter: Secondary | ICD-10-CM

## 2014-03-05 MED ORDER — HYDROCODONE-ACETAMINOPHEN 5-325 MG PO TABS: 2.0000 | ORAL_TABLET | ORAL | Status: DC | PRN

## 2014-03-05 MED ORDER — HYDROCODONE-ACETAMINOPHEN 5-325 MG PO TABS
2.0000 | ORAL_TABLET | Freq: Once | ORAL | Status: AC
  Administered 2014-03-05: 2 via ORAL
  Filled 2014-03-05: qty 2

## 2014-03-05 MED ORDER — HYDROCODONE-ACETAMINOPHEN 5-325 MG PO TABS
1.0000 | ORAL_TABLET | Freq: Once | ORAL | Status: AC
  Administered 2014-03-05: 1 via ORAL
  Filled 2014-03-05: qty 1

## 2014-03-05 NOTE — ED Notes (Signed)
Pt given discharge instruction and informed not operate machinery while taking narcotic.

## 2014-03-05 NOTE — Discharge Instructions (Signed)
Head Injury, Adult °You have received a head injury. It does not appear serious at this time. Headaches and vomiting are common following head injury. It should be easy to awaken from sleeping. Sometimes it is necessary for you to stay in the emergency department for a while for observation. Sometimes admission to the hospital may be needed. After injuries such as yours, most problems occur within the first 24 hours, but side effects may occur up to 7 10 days after the injury. It is important for you to carefully monitor your condition and contact your health care provider or seek immediate medical care if there is a change in your condition. °WHAT ARE THE TYPES OF HEAD INJURIES? °Head injuries can be as minor as a bump. Some head injuries can be more severe. More severe head injuries include: °· A jarring injury to the brain (concussion). °· A bruise of the brain (contusion). This mean there is bleeding in the brain that can cause swelling. °· A cracked skull (skull fracture). °· Bleeding in the brain that collects, clots, and forms a bump (hematoma). °WHAT CAUSES A HEAD INJURY? °A serious head injury is most likely to happen to someone who is in a car wreck and is not wearing a seat belt. Other causes of major head injuries include bicycle or motorcycle accidents, sports injuries, and falls. °HOW ARE HEAD INJURIES DIAGNOSED? °A complete history of the event leading to the injury and your current symptoms will be helpful in diagnosing head injuries. Many times, pictures of the brain, such as CT or MRI are needed to see the extent of the injury. Often, an overnight hospital stay is necessary for observation.  °WHEN SHOULD I SEEK IMMEDIATE MEDICAL CARE?  °You should get help right away if: °· You have confusion or drowsiness. °· You feel sick to your stomach (nauseous) or have continued, forceful vomiting. °· You have dizziness or unsteadiness that is getting worse. °· You have severe, continued headaches not  relieved by medicine. Only take over-the-counter or prescription medicines for pain, fever, or discomfort as directed by your health care provider. °· You do not have normal function of the arms or legs or are unable to walk. °· You notice changes in the black spots in the center of the colored part of your eye (pupil). °· You have a clear or bloody fluid coming from your nose or ears. °· You have a loss of vision. °During the next 24 hours after the injury, you must stay with someone who can watch you for the warning signs. This person should contact local emergency services (911 in the [place] if you have seizures, you become unconscious, or you are unable to wake up. °HOW CAN I PREVENT A HEAD INJURY IN THE FUTURE? °The most important factor for preventing major head injuries is avoiding motor vehicle accidents.  To minimize the potential for damage to your head, it is crucial to wear seat belts while riding in motor vehicles. Wearing helmets while bike riding and playing collision sports (like football) is also helpful. Also, avoiding dangerous activities around the house will further help reduce your risk of head injury.  °WHEN CAN I RETURN TO NORMAL ACTIVITIES AND ATHLETICS? °You should be reevaluated by your health care provider before returning to these activities. If you have any of the following symptoms, you should not return to activities or contact sports until 1 week after the symptoms have stopped: °· Persistent headache. °· Dizziness or vertigo. °· Poor attention and concentration. °·   Confusion.  Memory problems.  Nausea or vomiting.  Fatigue or tire easily.  Irritability.  Intolerant of bright lights or loud noises.  Anxiety or depression.  Disturbed sleep. MAKE SURE YOU:   Understand these instructions.  Will watch your condition.  Will get help right away if you are not doing well or get worse. Document Released: 10/30/2005 Document Revised: 08/20/2013 Document Reviewed:  07/07/[address] [facility] Patient Information 2014 [employer], [place]. Pt informed to not operate machinery while taking narcotic. Pt given bus pass as requested.

## 2014-03-05 NOTE — ED Notes (Signed)
Patient transported to CT 
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
## 2014-03-05 NOTE — Discharge Instructions (Signed)
Staples need to be removed in 10 days. See your doctor or go to [facility] urgent care.  Concussion, Adult A concussion, or closed-head injury, is a brain injury caused by a direct blow to the head or by a quick and sudden movement (jolt) of the head or neck. Concussions are usually not life-threatening. Even so, the effects of a concussion can be serious. If you have had a concussion before, you are more likely to experience concussion-like symptoms after a direct blow to the head.  CAUSES   Direct blow to the head, such as from running into another player during a soccer game, being hit in a fight, or hitting your head on a hard surface.  A jolt of the head or neck that causes the brain to move back and forth inside the skull, such as in a car crash. SIGNS AND SYMPTOMS  The signs of a concussion can be hard to notice. Early on, they may be missed by you, family members, and health care providers. You may look fine but act or feel differently. Symptoms are usually temporary, but they may last for days, weeks, or even longer. Some symptoms may appear right away while others may not show up for hours or days. Every head injury is different. Symptoms include:   Mild to moderate headaches that will not go away.  A feeling of pressure inside your head.  Having more trouble than usual:   Learning or remembering things you have heard.  Answering questions.  Paying attention or concentrating.   Organizing daily tasks.   Making decisions and solving problems.   Slowness in thinking, acting or reacting, speaking, or reading.   Getting lost or being easily confused.   Feeling tired all the time or lacking energy (fatigued).   Feeling drowsy.   Sleep disturbances.   Sleeping more than usual.   Sleeping less than usual.   Trouble falling asleep.   Trouble sleeping (insomnia).   Loss of balance or feeling lightheaded or dizzy.   Nausea or vomiting.   Numbness or  tingling.   Increased sensitivity to:   Sounds.   Lights.   Distractions.   Vision problems or eyes that tire easily.   Diminished sense of taste or smell.   Ringing in the ears.   Mood changes such as feeling sad or anxious.   Becoming easily irritated or angry for little or no reason.   Lack of motivation.  Seeing or hearing things other people do not see or hear (hallucinations). DIAGNOSIS  Your health care provider can usually diagnose a concussion based on a description of your injury and symptoms. He or she will ask whether you passed out (lost consciousness) and whether you are having trouble remembering events that happened right before and during your injury.  Your evaluation might include:   A brain scan to look for signs of injury to the brain. Even if the test shows no injury, you may still have a concussion.   Blood tests to be sure other problems are not present. TREATMENT   Concussions are usually treated in an emergency department, in urgent care, or at a clinic. You may need to stay in the hospital overnight for further treatment.   Tell your health care provider if you are taking any medicines, including prescription medicines, over-the-counter medicines, and natural remedies. Some medicines, such as blood thinners (anticoagulants) and aspirin, may increase the chance of complications. Also tell your health care provider whether you have had  alcohol or are taking illegal drugs. This information may affect treatment.  Your health care provider will send you home with important instructions to follow.  How fast you will recover from a concussion depends on many factors. These factors include how severe your concussion is, what part of your brain was injured, your age, and how healthy you were before the concussion.  Most people with mild injuries recover fully. Recovery can take time. In general, recovery is slower in older persons. Also, persons  who have had a concussion in the past or have other medical problems may find that it takes longer to recover from their current injury. HOME CARE INSTRUCTIONS  General Instructions  Carefully follow the directions your health care provider gave you.  Only take over-the-counter or prescription medicines for pain, discomfort, or fever as directed by your health care provider.  Take only those medicines that your health care provider has approved.  Do not drink alcohol until your health care provider says you are well enough to do so. Alcohol and certain other drugs may slow your recovery and can put you at risk of further injury.  If it is harder than usual to remember things, write them down.  If you are easily distracted, try to do one thing at a time. For example, do not try to watch TV while fixing dinner.  Talk with family members or close friends when making important decisions.  Keep all follow-up appointments. Repeated evaluation of your symptoms is recommended for your recovery.  Watch your symptoms and tell others to do the same. Complications sometimes occur after a concussion. Older adults with a brain injury may have a higher risk of serious complications such as of a blood clot on the brain.  Tell your teachers, school nurse, school counselor, coach, athletic trainer, or work Production designer, theatre/television/film about your injury, symptoms, and restrictions. Tell them about what you can or cannot do. They should watch for:   Increased problems with attention or concentration.   Increased difficulty remembering or learning new information.   Increased time needed to complete tasks or assignments.   Increased irritability or decreased ability to cope with stress.   Increased symptoms.   Rest. Rest helps the brain to heal. Make sure you:  Get plenty of sleep at night. Avoid staying up late at night.  Keep the same bedtime hours on weekends and weekdays.  Rest during the day. Take daytime  naps or rest breaks when you feel tired.  Limit activities that require a lot of thought or concentration. These includes   Doing homework or job-related work.   Watching TV.   Working on the computer.  Avoid any situation where there is potential for another head injury (football, hockey, soccer, basketball, martial arts, downhill snow sports and horseback riding). Your condition will get worse every time you experience a concussion. You should avoid these activities until you are evaluated by the appropriate follow-up caregivers. Returning To Your Regular Activities You will need to return to your normal activities slowly, not all at once. You must give your body and brain enough time for recovery.  Do not return to sports or other athletic activities until your health care provider tells you it is safe to do so.  Ask your health care provider when you can drive, ride a bicycle, or operate heavy machinery. Your ability to react may be slower after a brain injury. Never do these activities if you are dizzy.  Ask your health care provider  about when you can return to work or school. Preventing Another Concussion It is very important to avoid another brain injury, especially before you have recovered. In rare cases, another injury can lead to permanent brain damage, brain swelling, or death. The risk of this is greatest during the first 7 10 days after a head injury. Avoid injuries by:   Wearing a seat belt when riding in a car.   Drinking alcohol only in moderation.   Wearing a helmet when biking, skiing, skateboarding, skating, or doing similar activities.  Avoiding activities that could lead to a second concussion, such as contact or recreational sports, until your health care provider says it is OK.  Taking safety measures in your home.   Remove clutter and tripping hazards from floors and stairways.   Use grab bars in bathrooms and handrails by stairs.   Place non-slip  mats on floors and in bathtubs.   Improve lighting in dim areas. SEEK MEDICAL CARE IF:   You have increased problems paying attention or concentrating.   You have increased difficulty remembering or learning new information.   You need more time to complete tasks or assignments than before.   You have increased irritability or decreased ability to cope with stress.  You have more symptoms than before. Seek medical care if you have any of the following symptoms for more than 2 weeks after your injury:   Lasting (chronic) headaches.   Dizziness or balance problems.   Nausea.  Vision problems.   Increased sensitivity to noise or light.   Depression or mood swings.   Anxiety or irritability.   Memory problems.   Difficulty concentrating or paying attention.   Sleep problems.   Feeling tired all the time. SEEK IMMEDIATE MEDICAL CARE IF:   You have severe or worsening headaches. These may be a sign of a blood clot in the brain.  You have weakness (even if only in one hand, leg, or part of the face).  You have numbness.  You have decreased coordination.   You vomit repeatedly.  You have increased sleepiness.  One pupil is larger than the other.   You have convulsions.   You have slurred speech.   You have increased confusion. This may be a sign of a blood clot in the brain.  You have increased restlessness, agitation, or irritability.   You are unable to recognize people or places.   You have neck pain.   It is difficult to wake you up.   You have unusual behavior changes.   You lose consciousness. MAKE SURE YOU:   Understand these instructions.  Will watch your condition.  Will get help right away if you are not doing well or get worse. Document Released: 01/20/2004 Document Revised: 07/02/2013 Document Reviewed: 05/22/2013 [facility] Patient Information 2014 [place], [place].  Head Injury, Adult You have received a head  injury. It does not appear serious at this time. Headaches and vomiting are common following head injury. It should be easy to awaken from sleeping. Sometimes it is necessary for you to stay in the emergency department for a while for observation. Sometimes admission to the hospital may be needed. After injuries such as yours, most problems occur within the first 24 hours, but side effects may occur up to 7 10 days after the injury. It is important for you to carefully monitor your condition and contact your health care provider or seek immediate medical care if there is a change in your condition. WHAT  ARE THE TYPES OF HEAD INJURIES? Head injuries can be as minor as a bump. Some head injuries can be more severe. More severe head injuries include:  A jarring injury to the brain (concussion).  A bruise of the brain (contusion). This mean there is bleeding in the brain that can cause swelling.  A cracked skull (skull fracture).  Bleeding in the brain that collects, clots, and forms a bump (hematoma). WHAT CAUSES A HEAD INJURY? A serious head injury is most likely to happen to someone who is in a car wreck and is not wearing a seat belt. Other causes of major head injuries include bicycle or motorcycle accidents, sports injuries, and falls. HOW ARE HEAD INJURIES DIAGNOSED? A complete history of the event leading to the injury and your current symptoms will be helpful in diagnosing head injuries. Many times, pictures of the brain, such as CT or MRI are needed to see the extent of the injury. Often, an overnight hospital stay is necessary for observation.  WHEN SHOULD I SEEK IMMEDIATE MEDICAL CARE?  You should get help right away if:  You have confusion or drowsiness.  You feel sick to your stomach (nauseous) or have continued, forceful vomiting.  You have dizziness or unsteadiness that is getting worse.  You have severe, continued headaches not relieved by medicine. Only take over-the-counter or  prescription medicines for pain, fever, or discomfort as directed by your health care provider.  You do not have normal function of the arms or legs or are unable to walk.  You notice changes in the black spots in the center of the colored part of your eye (pupil).  You have a clear or bloody fluid coming from your nose or ears.  You have a loss of vision. During the next 24 hours after the injury, you must stay with someone who can watch you for the warning signs. This person should contact local emergency services (911 in the [place] if you have seizures, you become unconscious, or you are unable to wake up. HOW CAN I PREVENT A HEAD INJURY IN THE FUTURE? The most important factor for preventing major head injuries is avoiding motor vehicle accidents. To minimize the potential for damage to your head, it is crucial to wear seat belts while riding in motor vehicles. Wearing helmets while bike riding and playing collision sports (like football) is also helpful. Also, avoiding dangerous activities around the house will further help reduce your risk of head injury.  WHEN CAN I RETURN TO NORMAL ACTIVITIES AND ATHLETICS? You should be reevaluated by your health care provider before returning to these activities. If you have any of the following symptoms, you should not return to activities or contact sports until 1 week after the symptoms have stopped:  Persistent headache.  Dizziness or vertigo.  Poor attention and concentration.  Confusion.  Memory problems.  Nausea or vomiting.  Fatigue or tire easily.  Irritability.  Intolerant of bright lights or loud noises.  Anxiety or depression.  Disturbed sleep. MAKE SURE YOU:   Understand these instructions.  Will watch your condition.  Will get help right away if you are not doing well or get worse. Document Released: 10/30/2005 Document Revised: 08/20/2013 Document Reviewed: 07/07/2013 [facility] Patient Information 2014 [place],  [place].  Laceration Care, Adult A laceration is a cut or lesion that goes through all layers of the skin and into the tissue just beneath the skin. TREATMENT  Some lacerations may not require closure. Some lacerations may not be  able to be closed due to an increased risk of infection. It is important to see your caregiver as soon as possible after an injury to minimize the risk of infection and maximize the opportunity for successful closure. If closure is appropriate, pain medicines may be given, if needed. The wound will be cleaned to help prevent infection. Your caregiver will use stitches (sutures), staples, wound glue (adhesive), or skin adhesive strips to repair the laceration. These tools bring the skin edges together to allow for faster healing and a better cosmetic outcome. However, all wounds will heal with a scar. Once the wound has healed, scarring can be minimized by covering the wound with sunscreen during the day for 1 full year. HOME CARE INSTRUCTIONS  For sutures or staples:  Keep the wound clean and dry.  If you were given a bandage (dressing), you should change it at least once a day. Also, change the dressing if it becomes wet or dirty, or as directed by your caregiver.  Wash the wound with soap and water 2 times a day. Rinse the wound off with water to remove all soap. Pat the wound dry with a clean towel.  After cleaning, apply a thin layer of the antibiotic ointment as recommended by your caregiver. This will help prevent infection and keep the dressing from sticking.  You may shower as usual after the first 24 hours. Do not soak the wound in water until the sutures are removed.  Only take over-the-counter or prescription medicines for pain, discomfort, or fever as directed by your caregiver.  Get your sutures or staples removed as directed by your caregiver. For skin adhesive strips:  Keep the wound clean and dry.  Do not get the skin adhesive strips wet. You may bathe  carefully, using caution to keep the wound dry.  If the wound gets wet, pat it dry with a clean towel.  Skin adhesive strips will fall off on their own. You may trim the strips as the wound heals. Do not remove skin adhesive strips that are still stuck to the wound. They will fall off in time. For wound adhesive:  You may briefly wet your wound in the shower or bath. Do not soak or scrub the wound. Do not swim. Avoid periods of heavy perspiration until the skin adhesive has fallen off on its own. After showering or bathing, gently pat the wound dry with a clean towel.  Do not apply liquid medicine, cream medicine, or ointment medicine to your wound while the skin adhesive is in place. This may loosen the film before your wound is healed.  If a dressing is placed over the wound, be careful not to apply tape directly over the skin adhesive. This may cause the adhesive to be pulled off before the wound is healed.  Avoid prolonged exposure to sunlight or tanning lamps while the skin adhesive is in place. Exposure to ultraviolet light in the first year will darken the scar.  The skin adhesive will usually remain in place for 5 to 10 days, then naturally fall off the skin. Do not pick at the adhesive film. You may need a tetanus shot if:  You cannot remember when you had your last tetanus shot.  You have never had a tetanus shot. If you get a tetanus shot, your arm may swell, get red, and feel warm to the touch. This is common and not a problem. If you need a tetanus shot and you choose not to have one,  there is a rare chance of getting tetanus. Sickness from tetanus can be serious. SEEK MEDICAL CARE IF:   You have redness, swelling, or increasing pain in the wound.  You see a red line that goes away from the wound.  You have yellowish-white fluid (pus) coming from the wound.  You have a fever.  You notice a bad smell coming from the wound or dressing.  Your wound breaks open before or  after sutures have been removed.  You notice something coming out of the wound such as wood or glass.  Your wound is on your hand or foot and you cannot move a finger or toe. SEEK IMMEDIATE MEDICAL CARE IF:   Your pain is not controlled with prescribed medicine.  You have severe swelling around the wound causing pain and numbness or a change in color in your arm, hand, leg, or foot.  Your wound splits open and starts bleeding.  You have worsening numbness, weakness, or loss of function of any joint around or beyond the wound.  You develop painful lumps near the wound or on the skin anywhere on your body. MAKE SURE YOU:   Understand these instructions.  Will watch your condition.  Will get help right away if you are not doing well or get worse. Document Released: 10/30/2005 Document Revised: 01/22/2012 Document Reviewed: 04/25/2011 [facility] Patient Information 2014 [place]
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
## 2014-03-05 NOTE — ED Notes (Signed)
CT contacted for update on delay, states they will be to bedside soon
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
## 2014-03-09 ENCOUNTER — Encounter (HOSPITAL_COMMUNITY): Payer: Self-pay | Admitting: Emergency Medicine

## 2014-03-09 ENCOUNTER — Emergency Department (HOSPITAL_COMMUNITY): Payer: Medicaid Other

## 2014-03-09 ENCOUNTER — Emergency Department (HOSPITAL_COMMUNITY)
Admission: EM | Admit: 2014-03-09 | Discharge: 2014-03-09 | Disposition: A | Discharge status: Home / Self Care | Payer: Medicaid Other | Attending: Emergency Medicine | Admitting: Emergency Medicine

## 2014-03-09 DIAGNOSIS — S52209A Unspecified fracture of shaft of unspecified ulna, initial encounter for closed fracture: Secondary | ICD-10-CM

## 2014-03-09 DIAGNOSIS — W230XXA Caught, crushed, jammed, or pinched between moving objects, initial encounter: Secondary | ICD-10-CM | POA: Insufficient documentation

## 2014-03-09 DIAGNOSIS — Z8659 Personal history of other mental and behavioral disorders: Secondary | ICD-10-CM | POA: Insufficient documentation

## 2014-03-09 DIAGNOSIS — I10 Essential (primary) hypertension: Secondary | ICD-10-CM | POA: Insufficient documentation

## 2014-03-09 DIAGNOSIS — Y9389 Activity, other specified: Secondary | ICD-10-CM | POA: Insufficient documentation

## 2014-03-09 DIAGNOSIS — Y929 Unspecified place or not applicable: Secondary | ICD-10-CM | POA: Insufficient documentation

## 2014-03-09 DIAGNOSIS — S52609A Unspecified fracture of lower end of unspecified ulna, initial encounter for closed fracture: Secondary | ICD-10-CM | POA: Insufficient documentation

## 2014-03-09 DIAGNOSIS — Z8619 Personal history of other infectious and parasitic diseases: Secondary | ICD-10-CM | POA: Insufficient documentation

## 2014-03-09 DIAGNOSIS — F17200 Nicotine dependence, unspecified, uncomplicated: Secondary | ICD-10-CM | POA: Insufficient documentation

## 2014-03-09 DIAGNOSIS — Z8719 Personal history of other diseases of the digestive system: Secondary | ICD-10-CM | POA: Insufficient documentation

## 2014-03-09 DIAGNOSIS — Z8742 Personal history of other diseases of the female genital tract: Secondary | ICD-10-CM | POA: Insufficient documentation

## 2014-03-09 HISTORY — DX: Unspecified fracture of shaft of unspecified ulna, initial encounter for closed fracture: S52.209A

## 2014-03-09 MED ORDER — ONDANSETRON 4 MG PO TBDP
4.0000 mg | ORAL_TABLET | Freq: Once | ORAL | Status: AC
  Administered 2014-03-09: 4 mg via ORAL
  Filled 2014-03-09: qty 1

## 2014-03-09 MED ORDER — OXYCODONE-ACETAMINOPHEN 5-325 MG PO TABS
1.0000 | ORAL_TABLET | Freq: Once | ORAL | Status: AC
  Administered 2014-03-09: 1 via ORAL
  Filled 2014-03-09: qty 1

## 2014-03-09 MED ORDER — OXYCODONE-ACETAMINOPHEN 5-325 MG PO TABS: 1.0000 | ORAL_TABLET | Freq: Four times a day (QID) | ORAL | Status: DC | PRN
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
## 2014-03-09 NOTE — Discharge Instructions (Signed)
Cast or Splint Care Casts and splints support injured limbs and keep bones from moving while they heal. It is important to care for your cast or splint at home.  HOME CARE INSTRUCTIONS  Keep the cast or splint uncovered during the drying period. It can take 24 to 48 hours to dry if it is made of plaster. A fiberglass cast will dry in less than 1 hour.  Do not rest the cast on anything harder than a pillow for the first 24 hours.  Do not put weight on your injured limb or apply pressure to the cast until your health care provider gives you permission.  Keep the cast or splint dry. Wet casts or splints can lose their shape and may not support the limb as well. A wet cast that has lost its shape can also create harmful pressure on your skin when it dries. Also, wet skin can become infected.  Cover the cast or splint with a plastic bag when bathing or when out in the rain or snow. If the cast is on the trunk of the body, take sponge baths until the cast is removed.  If your cast does become wet, dry it with a towel or a blow dryer on the cool setting only.  Keep your cast or splint clean. Soiled casts may be wiped with a moistened cloth.  Do not place any hard or soft foreign objects under your cast or splint, such as cotton, toilet paper, lotion, or powder.  Do not try to scratch the skin under the cast with any object. The object could get stuck inside the cast. Also, scratching could lead to an infection. If itching is a problem, use a blow dryer on a cool setting to relieve discomfort.  Do not trim or cut your cast or remove padding from inside of it.  Exercise all joints next to the injury that are not immobilized by the cast or splint. For example, if you have a long leg cast, exercise the hip joint and toes. If you have an arm cast or splint, exercise the shoulder, elbow, thumb, and fingers.  Elevate your injured arm or leg on 1 or 2 pillows for the first 1 to 3 days to decrease  swelling and pain.It is best if you can comfortably elevate your cast so it is higher than your heart. SEEK MEDICAL CARE IF:   Your cast or splint cracks.  Your cast or splint is too tight or too loose.  You have unbearable itching inside the cast.  Your cast becomes wet or develops a soft spot or area.  You have a bad smell coming from inside your cast.  You get an object stuck under your cast.  Your skin around the cast becomes red or raw.  You have new pain or worsening pain after the cast has been applied. SEEK IMMEDIATE MEDICAL CARE IF:   You have fluid leaking through the cast.  You are unable to move your fingers or toes.  You have discolored (blue or white), cool, painful, or very swollen fingers or toes beyond the cast.  You have tingling or numbness around the injured area.  You have severe pain or pressure under the cast.  You have any difficulty with your breathing or have shortness of breath.  You have chest pain. Document Released: 10/27/2000 Document Revised: 08/20/2013 Document Reviewed: 05/08/2013 [facility] Patient Information 2014 [place], [place].  Forearm Fracture Your caregiver has diagnosed you as having a broken bone (fracture) of  the forearm. This is the part of your arm between the elbow and your wrist. Your forearm is made up of two bones. These are the radius and ulna. A fracture is a break in one or both bones. A cast or splint is used to protect and keep your injured bone from moving. The cast or splint will be on generally for about 5 to 6 weeks, with individual variations. HOME CARE INSTRUCTIONS   Keep the injured part elevated while sitting or lying down. Keeping the injury above the level of your heart (the center of the chest). This will decrease swelling and pain.  Apply ice to the injury for 15-20 minutes, 03-04 times per day while awake, for 2 days. Put the ice in a plastic bag and place a thin towel between the bag of ice and your cast or  splint.  If you have a plaster or fiberglass cast:  Do not try to scratch the skin under the cast using sharp or pointed objects.  Check the skin around the cast every day. You may put lotion on any red or sore areas.  Keep your cast dry and clean.  If you have a plaster splint:  Wear the splint as directed.  You may loosen the elastic around the splint if your fingers become numb, tingle, or turn cold or blue.  Do not put pressure on any part of your cast or splint. It may break. Rest your cast only on a pillow the first 24 hours until it is fully hardened.  Your cast or splint can be protected during bathing with a plastic bag. Do not lower the cast or splint into water.  Only take over-the-counter or prescription medicines for pain, discomfort, or fever as directed by your caregiver. SEEK IMMEDIATE MEDICAL CARE IF:   Your cast gets damaged or breaks.  You have more severe pain or swelling than you did before the cast.  Your skin or nails below the injury turn blue or gray, or feel cold or numb.  There is a bad smell or new stains and/or pus like (purulent) drainage coming from under the cast. MAKE SURE YOU:   Understand these instructions.  Will watch your condition.  Will get help right away if you are not doing well or get worse. Document Released: 10/27/2000 Document Revised: 01/22/2012 Document Reviewed: 06/18/2008 [facility]Exit[facility] Patient Information 2014 [place], [place].
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
## 2014-03-09 NOTE — Progress Notes (Signed)
Orthopedic Tech Progress Note Patient Details:  Leah Barry 02/28/1986 [id number]  Ortho Devices Type of Ortho Device: Ace wrap;Arm sling;Velcro wrist splint Ortho Device/Splint Interventions: Ordered;Application   [name] 03/09/2014, 4:52 PM

## 2014-03-10 NOTE — ED Provider Notes (Signed)
Medical screening examination/treatment/procedure(s) were performed by non-physician practitioner and as supervising physician I was immediately available for consultation/collaboration.   EKG Interpretation None       Johnnetta Holstine K Linker, MD 03/10/14 2047 

## 2014-03-12 ENCOUNTER — Encounter (HOSPITAL_BASED_OUTPATIENT_CLINIC_OR_DEPARTMENT_OTHER): Payer: Self-pay | Admitting: *Deleted

## 2014-03-13 ENCOUNTER — Other Ambulatory Visit: Payer: Self-pay | Admitting: Orthopedic Surgery

## 2014-03-14 ENCOUNTER — Encounter (HOSPITAL_COMMUNITY): Payer: Self-pay | Admitting: Emergency Medicine

## 2014-03-14 ENCOUNTER — Emergency Department (INDEPENDENT_AMBULATORY_CARE_PROVIDER_SITE_OTHER)
Admission: EM | Admit: 2014-03-14 | Discharge: 2014-03-14 | Disposition: A | Discharge status: Home / Self Care | Payer: Medicaid Other | Source: Home / Self Care | Attending: Family Medicine | Admitting: Family Medicine

## 2014-03-14 DIAGNOSIS — Z4802 Encounter for removal of sutures: Secondary | ICD-10-CM

## 2014-03-14 NOTE — ED Provider Notes (Signed)
CSN: [id number]     Arrival date & time 03/14/14  1637 History   First MD Initiated Contact with Patient 03/14/14 1649     Chief Complaint  Patient presents with  . Suture / Staple Removal   (Consider location/radiation/quality/duration/timing/severity/associated sxs/prior Treatment) Patient is a 28 y.o. female presenting with suture removal. The history is provided by the patient.  Suture / Staple Removal This is a new problem. The current episode started more than 1 week ago (scalp lac and left thigh lac on 4/27, here for staple and suture removal, no problems.). The problem has been rapidly improving.    Past Medical History  Diagnosis Date  . Anxiety   . Depression   . Bipolar disorder     no current med.  . Irritable bowel syndrome (IBS)     no current med.  [name]. Ulnar fracture 03/09/2014    left  . Scalp laceration     staples to be removed 03/13/2014  . History of MRSA infection 2010  . Apnea, sleep     no CPAP use, states lost machine during move to [place]   Past Surgical History  Procedure Laterality Date  . Ovarian cyst removal    . Tubal ligation    . Thyroid cyst excision      nodule exc.  [name]. Cyst excision      middle of back   Family History  Problem Relation Age of Onset  . Heart disease Mother   . Cancer Maternal Grandmother     breast  . Cancer Paternal Grandmother     breast   History  Substance Use Topics  . Smoking status: Current Every Day Smoker -- 3 years    Types: Cigarettes  . Smokeless tobacco: Never Used     Comment: 1-2 cig./day  . Alcohol Use: No   OB History   Grav Para Term Preterm Abortions TAB SAB Ect Mult Living   4 4 4       4      Review of Systems  Constitutional: Negative.   Skin: Positive for wound.    Allergies  Flagyl; Naproxen; and Sulfa antibiotics  Home Medications   Prior to Admission medications   Medication Sig Start Date End Date Taking? Authorizing Provider  ibuprofen (ADVIL,MOTRIN) 600 MG tablet Take 600 mg by  mouth every 6 (six) hours as needed for mild pain.    Historical Provider, MD  oxyCODONE-acetaminophen (PERCOCET/ROXICET) 5-325 MG per tablet Take 1-2 tablets by mouth every 6 (six) hours as needed for severe pain. 03/09/14   Tiffany Irine SealG Greene, PA-C   BP 121/80  Pulse 85  Temp(Src) 98 F (36.7 C) (Oral)  Resp 18  SpO2 100%  LMP 03/05/2014 Physical Exam  Nursing note and vitals reviewed. Constitutional: She is oriented to person, place, and time. She appears well-developed and well-nourished.  Neurological: She is alert and oriented to person, place, and time.  Skin: Skin is warm and dry.  Lacs well healed, 4 thigh sutures removed, 6 staples removed.    ED Course  Procedures (including critical care time) Labs Review Labs Reviewed - No data to display  Imaging Review No results found.   MDM   1. Encounter for removal of sutures        Linna HoffJames D Gabbie Marzo, MD 03/16/14 2145

## 2014-03-14 NOTE — ED Notes (Addendum)
Patient present for staples removal from head and suture removal from left leg. Patient thinks sutures in leg are infected. Pt reports she has been having headaches and feels like her memory has been affected. Pt is alert and oriented and in no acute distress.

## 2014-03-14 NOTE — Discharge Instructions (Signed)
Return as needed, orthopedic care as previously advised.

## 2014-03-16 ENCOUNTER — Encounter (HOSPITAL_BASED_OUTPATIENT_CLINIC_OR_DEPARTMENT_OTHER): Payer: Medicaid Other | Admitting: Anesthesiology

## 2014-03-16 ENCOUNTER — Ambulatory Visit (HOSPITAL_BASED_OUTPATIENT_CLINIC_OR_DEPARTMENT_OTHER)
Admission: RE | Admit: 2014-03-16 | Discharge: 2014-03-16 | Disposition: A | Discharge status: Home / Self Care | Payer: Medicaid Other | Source: Ambulatory Visit | Attending: Orthopedic Surgery | Admitting: Orthopedic Surgery

## 2014-03-16 ENCOUNTER — Ambulatory Visit (HOSPITAL_BASED_OUTPATIENT_CLINIC_OR_DEPARTMENT_OTHER): Payer: Medicaid Other | Admitting: Anesthesiology

## 2014-03-16 ENCOUNTER — Encounter (HOSPITAL_BASED_OUTPATIENT_CLINIC_OR_DEPARTMENT_OTHER)
Admission: RE | Disposition: A | Discharge status: Home / Self Care | Payer: Self-pay | Source: Ambulatory Visit | Attending: Orthopedic Surgery

## 2014-03-16 ENCOUNTER — Encounter (HOSPITAL_BASED_OUTPATIENT_CLINIC_OR_DEPARTMENT_OTHER): Payer: Self-pay

## 2014-03-16 DIAGNOSIS — Z886 Allergy status to analgesic agent status: Secondary | ICD-10-CM | POA: Insufficient documentation

## 2014-03-16 DIAGNOSIS — S52609A Unspecified fracture of lower end of unspecified ulna, initial encounter for closed fracture: Secondary | ICD-10-CM | POA: Insufficient documentation

## 2014-03-16 DIAGNOSIS — G4730 Sleep apnea, unspecified: Secondary | ICD-10-CM | POA: Insufficient documentation

## 2014-03-16 DIAGNOSIS — X58XXXA Exposure to other specified factors, initial encounter: Secondary | ICD-10-CM | POA: Insufficient documentation

## 2014-03-16 DIAGNOSIS — F17200 Nicotine dependence, unspecified, uncomplicated: Secondary | ICD-10-CM | POA: Insufficient documentation

## 2014-03-16 DIAGNOSIS — Z882 Allergy status to sulfonamides status: Secondary | ICD-10-CM | POA: Insufficient documentation

## 2014-03-16 DIAGNOSIS — F411 Generalized anxiety disorder: Secondary | ICD-10-CM | POA: Insufficient documentation

## 2014-03-16 DIAGNOSIS — K589 Irritable bowel syndrome without diarrhea: Secondary | ICD-10-CM | POA: Insufficient documentation

## 2014-03-16 DIAGNOSIS — Z8614 Personal history of Methicillin resistant Staphylococcus aureus infection: Secondary | ICD-10-CM | POA: Insufficient documentation

## 2014-03-16 DIAGNOSIS — Y929 Unspecified place or not applicable: Secondary | ICD-10-CM | POA: Insufficient documentation

## 2014-03-16 DIAGNOSIS — F319 Bipolar disorder, unspecified: Secondary | ICD-10-CM | POA: Insufficient documentation

## 2014-03-16 HISTORY — DX: Depression, unspecified: F32.A

## 2014-03-16 HISTORY — DX: Personal history of Methicillin resistant Staphylococcus aureus infection: Z86.14

## 2014-03-16 HISTORY — DX: Major depressive disorder, single episode, unspecified: F32.9

## 2014-03-16 HISTORY — DX: Unspecified fracture of shaft of unspecified ulna, initial encounter for closed fracture: S52.209A

## 2014-03-16 HISTORY — DX: Laceration without foreign body of scalp, initial encounter: S01.01XA

## 2014-03-16 HISTORY — DX: Bipolar disorder, unspecified: F31.9

## 2014-03-16 HISTORY — PX: ORIF ULNAR FRACTURE: SHX5417

## 2014-03-16 HISTORY — DX: Irritable bowel syndrome, unspecified: K58.9

## 2014-03-16 LAB — POCT HEMOGLOBIN-HEMACUE: Hemoglobin: 14.2 g/dL (ref 12.0–15.0)

## 2014-03-16 SURGERY — OPEN REDUCTION INTERNAL FIXATION (ORIF) ULNAR FRACTURE
Anesthesia: General | Site: Wrist | Laterality: Left

## 2014-03-16 MED ORDER — ONDANSETRON HCL 4 MG/2ML IJ SOLN: 4.0000 mg | Freq: Once | INTRAMUSCULAR | Status: DC | PRN

## 2014-03-16 MED ORDER — CHLORHEXIDINE GLUCONATE 4 % EX LIQD: 60.0000 mL | Freq: Once | CUTANEOUS | Status: DC

## 2014-03-16 MED ORDER — MIDAZOLAM HCL 2 MG/2ML IJ SOLN
INTRAMUSCULAR | Status: AC
  Filled 2014-03-16: qty 2

## 2014-03-16 MED ORDER — PROPOFOL 10 MG/ML IV BOLUS
INTRAVENOUS | Status: DC | PRN
  Administered 2014-03-16: 220 mg via INTRAVENOUS

## 2014-03-16 MED ORDER — OXYCODONE-ACETAMINOPHEN 5-325 MG PO TABS: 1.0000 | ORAL_TABLET | ORAL | Status: DC | PRN

## 2014-03-16 MED ORDER — BUPIVACAINE HCL (PF) 0.25 % IJ SOLN
INTRAMUSCULAR | Status: AC
  Filled 2014-03-16: qty 60

## 2014-03-16 MED ORDER — DIPHENHYDRAMINE HCL 50 MG/ML IJ SOLN
12.5000 mg | Freq: Once | INTRAMUSCULAR | Status: AC
  Administered 2014-03-16: 12.5 mg via INTRAVENOUS

## 2014-03-16 MED ORDER — CEFAZOLIN SODIUM-DEXTROSE 2-3 GM-% IV SOLR
INTRAVENOUS | Status: AC
  Filled 2014-03-16: qty 50

## 2014-03-16 MED ORDER — LIDOCAINE HCL (CARDIAC) 20 MG/ML IV SOLN
INTRAVENOUS | Status: DC | PRN
  Administered 2014-03-16: 75 mg via INTRAVENOUS

## 2014-03-16 MED ORDER — FENTANYL CITRATE 0.05 MG/ML IJ SOLN
INTRAMUSCULAR | Status: DC | PRN
  Administered 2014-03-16: 50 ug via INTRAVENOUS
  Administered 2014-03-16: 100 ug via INTRAVENOUS
  Administered 2014-03-16: 50 ug via INTRAVENOUS

## 2014-03-16 MED ORDER — DEXAMETHASONE SODIUM PHOSPHATE 4 MG/ML IJ SOLN
INTRAMUSCULAR | Status: DC | PRN
  Administered 2014-03-16: 10 mg via INTRAVENOUS

## 2014-03-16 MED ORDER — BUPIVACAINE HCL 0.25 % IJ SOLN
INTRAMUSCULAR | Status: DC | PRN
  Administered 2014-03-16: 10 mL

## 2014-03-16 MED ORDER — MIDAZOLAM HCL 2 MG/ML PO SYRP: 12.0000 mg | ORAL_SOLUTION | Freq: Once | ORAL | Status: DC | PRN

## 2014-03-16 MED ORDER — OXYCODONE HCL 5 MG PO TABS
5.0000 mg | ORAL_TABLET | Freq: Once | ORAL | Status: AC | PRN
Start: 2014-03-16 — End: 2014-03-16
  Administered 2014-03-16: 5 mg via ORAL

## 2014-03-16 MED ORDER — MIDAZOLAM HCL 2 MG/2ML IJ SOLN: 1.0000 mg | INTRAMUSCULAR | Status: DC | PRN

## 2014-03-16 MED ORDER — CEFAZOLIN SODIUM-DEXTROSE 2-3 GM-% IV SOLR
2.0000 g | INTRAVENOUS | Status: AC
  Administered 2014-03-16: 2 g via INTRAVENOUS

## 2014-03-16 MED ORDER — HYDROMORPHONE HCL PF 1 MG/ML IJ SOLN
INTRAMUSCULAR | Status: AC
  Filled 2014-03-16: qty 1

## 2014-03-16 MED ORDER — DIPHENHYDRAMINE HCL 50 MG/ML IJ SOLN
INTRAMUSCULAR | Status: AC
  Filled 2014-03-16: qty 1

## 2014-03-16 MED ORDER — HYDROMORPHONE HCL PF 1 MG/ML IJ SOLN
0.2500 mg | INTRAMUSCULAR | Status: DC | PRN
  Administered 2014-03-16 (×4): 0.5 mg via INTRAVENOUS

## 2014-03-16 MED ORDER — OXYCODONE HCL 5 MG PO TABS
ORAL_TABLET | ORAL | Status: AC
  Filled 2014-03-16: qty 1

## 2014-03-16 MED ORDER — MIDAZOLAM HCL 5 MG/5ML IJ SOLN
INTRAMUSCULAR | Status: DC | PRN
  Administered 2014-03-16: 2 mg via INTRAVENOUS

## 2014-03-16 MED ORDER — FENTANYL CITRATE 0.05 MG/ML IJ SOLN: 50.0000 ug | INTRAMUSCULAR | Status: DC | PRN

## 2014-03-16 MED ORDER — ONDANSETRON HCL 4 MG/2ML IJ SOLN
INTRAMUSCULAR | Status: DC | PRN
  Administered 2014-03-16: 4 mg via INTRAVENOUS

## 2014-03-16 MED ORDER — LACTATED RINGERS IV SOLN
INTRAVENOUS | Status: DC
Start: 2014-03-16 — End: 2014-03-16
  Administered 2014-03-16 (×2): via INTRAVENOUS

## 2014-03-16 MED ORDER — FENTANYL CITRATE 0.05 MG/ML IJ SOLN
INTRAMUSCULAR | Status: AC
  Filled 2014-03-16: qty 6

## 2014-03-16 SURGICAL SUPPLY — 72 items
APL SKNCLS STERI-STRIP NONHPOA (GAUZE/BANDAGES/DRESSINGS)
BAG DECANTER FOR FLEXI CONT (MISCELLANEOUS) IMPLANT
BANDAGE ELASTIC 3 VELCRO ST LF (GAUZE/BANDAGES/DRESSINGS) ×1 IMPLANT
BANDAGE ELASTIC 4 VELCRO ST LF (GAUZE/BANDAGES/DRESSINGS) ×4 IMPLANT
BENZOIN TINCTURE PRP APPL 2/3 (GAUZE/BANDAGES/DRESSINGS) IMPLANT
BIT DRILL 2.5X2.75 QC CALB (BIT) ×1 IMPLANT
BLADE 15 SAFETY STRL DISP (BLADE) ×3 IMPLANT
BLADE MINI RND TIP GREEN BEAV (BLADE) IMPLANT
BLADE SURG 15 STRL LF DISP TIS (BLADE) IMPLANT
BLADE SURG 15 STRL SS (BLADE) ×2
BNDG CMPR 9X4 STRL LF SNTH (GAUZE/BANDAGES/DRESSINGS) ×1
BNDG ESMARK 4X9 LF (GAUZE/BANDAGES/DRESSINGS) ×1 IMPLANT
BNDG GAUZE ELAST 4 BULKY (GAUZE/BANDAGES/DRESSINGS) ×2 IMPLANT
CANISTER SUCT 1200ML W/VALVE (MISCELLANEOUS) IMPLANT
CORDS BIPOLAR (ELECTRODE) ×2 IMPLANT
COVER TABLE BACK 60X90 (DRAPES) ×2 IMPLANT
CUFF TOURNIQUET SINGLE 18IN (TOURNIQUET CUFF) ×2 IMPLANT
DECANTER SPIKE VIAL GLASS SM (MISCELLANEOUS) IMPLANT
DRAPE EXTREMITY T 121X128X90 (DRAPE) ×2 IMPLANT
DRAPE OEC MINIVIEW 54X84 (DRAPES) ×4 IMPLANT
DRAPE SURG 17X23 STRL (DRAPES) ×2 IMPLANT
DURAPREP 26ML APPLICATOR (WOUND CARE) ×2 IMPLANT
ELECT REM PT RETURN 9FT ADLT (ELECTROSURGICAL)
ELECTRODE REM PT RTRN 9FT ADLT (ELECTROSURGICAL) IMPLANT
GAUZE SPONGE 4X4 12PLY STRL (GAUZE/BANDAGES/DRESSINGS) ×2 IMPLANT
GAUZE SPONGE 4X4 16PLY XRAY LF (GAUZE/BANDAGES/DRESSINGS) IMPLANT
GAUZE XEROFORM 1X8 LF (GAUZE/BANDAGES/DRESSINGS) IMPLANT
GLOVE SURG SS PI 7.0 STRL IVOR (GLOVE) ×1 IMPLANT
GLOVE SURG SYN 8.0 (GLOVE) ×4 IMPLANT
GOWN STRL REUS W/ TWL LRG LVL3 (GOWN DISPOSABLE) ×1 IMPLANT
GOWN STRL REUS W/TWL LRG LVL3 (GOWN DISPOSABLE) ×2
GOWN STRL REUS W/TWL XL LVL3 (GOWN DISPOSABLE) ×2 IMPLANT
NEEDLE HYPO 25X1 1.5 SAFETY (NEEDLE) ×2 IMPLANT
NS IRRIG 1000ML POUR BTL (IV SOLUTION) ×2 IMPLANT
PACK BASIN DAY SURGERY FS (CUSTOM PROCEDURE TRAY) ×2 IMPLANT
PAD CAST 3X4 CTTN HI CHSV (CAST SUPPLIES) ×1 IMPLANT
PAD CAST 4YDX4 CTTN HI CHSV (CAST SUPPLIES) ×1 IMPLANT
PADDING CAST ABS 4INX4YD NS (CAST SUPPLIES)
PADDING CAST ABS COTTON 4X4 ST (CAST SUPPLIES) ×1 IMPLANT
PADDING CAST COTTON 3X4 STRL (CAST SUPPLIES) ×2
PADDING CAST COTTON 4X4 STRL (CAST SUPPLIES) ×2
PENCIL BUTTON HOLSTER BLD 10FT (ELECTRODE) IMPLANT
PLATE LOCK COMP 5H FOOT (Plate) ×1 IMPLANT
SCREW CORTICAL 3.5MM  16MM (Screw) ×3 IMPLANT
SCREW CORTICAL 3.5MM 14MM (Screw) ×2 IMPLANT
SCREW CORTICAL 3.5MM 16MM (Screw) ×3 IMPLANT
SHEET MEDIUM DRAPE 40X70 STRL (DRAPES) ×2 IMPLANT
SLEEVE SCD COMPRESS KNEE MED (MISCELLANEOUS) ×2 IMPLANT
SPLINT FAST PLASTER 5X30 (CAST SUPPLIES) ×10
SPLINT PLASTER CAST FAST 5X30 (CAST SUPPLIES) ×10 IMPLANT
SPLINT PLASTER CAST XFAST 3X15 (CAST SUPPLIES) IMPLANT
SPLINT PLASTER CAST XFAST 4X15 (CAST SUPPLIES) ×5 IMPLANT
SPLINT PLASTER XTRA FAST SET 4 (CAST SUPPLIES) ×5
SPLINT PLASTER XTRA FASTSET 3X (CAST SUPPLIES) ×1
STOCKINETTE 4X48 STRL (DRAPES) ×2 IMPLANT
STRIP CLOSURE SKIN 1/2X4 (GAUZE/BANDAGES/DRESSINGS) ×2 IMPLANT
SUCTION FRAZIER TIP 10 FR DISP (SUCTIONS) IMPLANT
SUT ETHILON 4 0 PS 2 18 (SUTURE) IMPLANT
SUT MERSILENE 4 0 P 3 (SUTURE) IMPLANT
SUT PROLENE 3 0 PS 2 (SUTURE) ×1 IMPLANT
SUT SILK 2 0 FS (SUTURE) IMPLANT
SUT VIC AB 2-0 SH 27 (SUTURE) ×2
SUT VIC AB 2-0 SH 27XBRD (SUTURE) ×1 IMPLANT
SUT VIC AB 3-0 FS2 27 (SUTURE) IMPLANT
SUT VICRYL RAPIDE 4-0 (SUTURE) IMPLANT
SUT VICRYL RAPIDE 4/0 PS 2 (SUTURE) IMPLANT
SYR BULB 3OZ (MISCELLANEOUS) ×2 IMPLANT
SYRINGE 10CC LL (SYRINGE) ×2 IMPLANT
TOWEL OR 17X24 6PK STRL BLUE (TOWEL DISPOSABLE) ×2 IMPLANT
TUBE CONNECTING 20X1/4 (TUBING) IMPLANT
TUBING SCD EXPRESS 7FT (MISCELLANEOUS) IMPLANT
UNDERPAD 30X30 INCONTINENT (UNDERPADS AND DIAPERS) ×2 IMPLANT

## 2014-03-16 NOTE — Transfer of Care (Signed)
Immediate Anesthesia Transfer of Care Note  Patient: Leah Barry  Procedure(s) Performed: Procedure(s) with comments: OPEN REDUCTION INTERNAL FIXATION (ORIF) LEFT ULNAR FRACTURE (Left) - left  Patient Location: PACU  Anesthesia Type:General  Level of Consciousness: awake, alert  and oriented  Airway & Oxygen Therapy: Patient Spontanous Breathing and Patient connected to face mask oxygen  Post-op Assessment: Report given to PACU RN and Post -op Vital signs reviewed and stable  Post vital signs: Reviewed and stable  Complications: No apparent anesthesia complications

## 2014-03-16 NOTE — H&P (Signed)
Leah Barry is an 28 y.o. female.   Chief Complaint: left forearm pain HPI: as above s/p left forearm trauma with displaced distal ulna fracture  Past Medical History  Diagnosis Date  . Anxiety   . Depression   . Bipolar disorder     no current med.  . Irritable bowel syndrome (IBS)     no current med.  [name]. Ulnar fracture 03/09/2014    left  . Scalp laceration     staples to be removed 03/13/2014  . History of MRSA infection 2010  . Apnea, sleep     no CPAP use, states lost machine during move to [place]    Past Surgical History  Procedure Laterality Date  . Ovarian cyst removal    . Tubal ligation    . Thyroid cyst excision      nodule exc.  [name]. Cyst excision      middle of back    Family History  Problem Relation Age of Onset  . Heart disease Mother   . Cancer Maternal Grandmother     breast  . Cancer Paternal Grandmother     breast   Social History:  reports that she has been smoking Cigarettes.  She has been smoking about 0.00 packs per day for the past 3 years. She has never used smokeless tobacco. She reports that she does not drink alcohol or use illicit drugs.  Allergies:  Allergies  Allergen Reactions  . Flagyl [Metronidazole] Nausea And Vomiting  . Naproxen Other (See Comments)    NOSEBLEED  . Sulfa Antibiotics Swelling    SWELLING OF EYES    Medications Prior to Admission  Medication Sig Dispense Refill  . ibuprofen (ADVIL,MOTRIN) 600 MG tablet Take 600 mg by mouth every 6 (six) hours as needed for mild pain.      [name]. oxyCODONE-acetaminophen (PERCOCET/ROXICET) 5-325 MG per tablet Take 1-2 tablets by mouth every 6 (six) hours as needed for severe pain.  30 tablet  0    Results for orders placed during the hospital encounter of 03/16/14 (from the past 48 hour(s))  POCT HEMOGLOBIN-HEMACUE     Status: None   Collection Time    03/16/14  9:53 AM      Result Value Ref Range   Hemoglobin 14.2  12.0 - 15.0 g/dL   No results found.  Review of Systems  All  other systems reviewed and are negative.   Blood pressure 113/78, pulse 97, temperature 98.3 F (36.8 C), temperature source Oral, resp. rate 22, height 5\' 7"  (1.702 m), weight 78.019 kg (172 lb), last menstrual period 03/05/2014, SpO2 98.00%. Physical Exam  Constitutional: She is oriented to person, place, and time. She appears well-developed and well-nourished.  HENT:  Head: Normocephalic and atraumatic.  Cardiovascular: Normal rate.   Respiratory: Effort normal.  Musculoskeletal:       Left forearm: She exhibits tenderness and bony tenderness.  Displaced left distal ulna fracture  Neurological: She is alert and oriented to person, place, and time.  Skin: Skin is warm.  Psychiatric: She has a normal mood and affect. Her behavior is normal. Judgment and thought content normal.     Assessment/Plan As above   Plan ORIF  Leah Barry 03/16/2014, 10:00 AM

## 2014-03-16 NOTE — Anesthesia Preprocedure Evaluation (Signed)
Anesthesia Evaluation  Patient identified by MRN, date of birth, ID band Patient awake    Reviewed: Allergy & Precautions, H&P , NPO status , Patient's Chart, lab work & pertinent test results  Airway       Dental   Pulmonary Current Smoker,          Cardiovascular     Neuro/Psych    GI/Hepatic   Endo/Other    Renal/GU      Musculoskeletal   Abdominal   Peds  Hematology   Anesthesia Other Findings   Reproductive/Obstetrics                           Anesthesia Physical Anesthesia Plan  ASA: II  Anesthesia Plan: General   Post-op Pain Management:    Induction: Intravenous  Airway Management Planned: LMA and Oral ETT  Additional Equipment:   Intra-op Plan:   Post-operative Plan: Extubation in OR  Informed Consent: I have reviewed the patients History and Physical, chart, labs and discussed the procedure including the risks, benefits and alternatives for the proposed anesthesia with the patient or authorized representative who has indicated his/her understanding and acceptance.     Plan Discussed with:   Anesthesia Plan Comments:         Anesthesia Quick Evaluation

## 2014-03-16 NOTE — Discharge Instructions (Signed)

## 2014-03-16 NOTE — Anesthesia Postprocedure Evaluation (Signed)
Anesthesia Post Note  Patient: Leah Barry  Procedure(s) Performed: Procedure(s) (LRB): OPEN REDUCTION INTERNAL FIXATION (ORIF) LEFT ULNAR FRACTURE (Left)  Anesthesia type: General  Patient location: PACU  Post pain: Pain level controlled and Adequate analgesia  Post assessment: Post-op Vital signs reviewed, Patient's Cardiovascular Status Stable, Respiratory Function Stable, Patent Airway and Pain level controlled  Last Vitals:  Filed Vitals:   03/16/14 1130  BP: 131/100  Pulse: 87  Temp: 36.7 C  Resp: 18    Post vital signs: Reviewed and stable  Level of consciousness: awake, alert  and oriented  Complications: No apparent anesthesia complications

## 2014-03-16 NOTE — Op Note (Signed)
See note [id number]

## 2014-03-16 NOTE — Anesthesia Procedure Notes (Signed)
Procedure Name: LMA Insertion Date/Time: 03/16/2014 10:27 AM Performed by: Zenia ResidesPAYNE, Eudelia Hiltunen D Pre-anesthesia Checklist: Patient identified, Emergency Drugs available, Suction available and Patient being monitored Patient Re-evaluated:Patient Re-evaluated prior to inductionOxygen Delivery Method: Circle System Utilized Preoxygenation: Pre-oxygenation with 100% oxygen Intubation Type: IV induction Ventilation: Mask ventilation without difficulty LMA: LMA inserted LMA Size: 4.0 Number of attempts: 1 Airway Equipment and Method: bite block Placement Confirmation: positive ETCO2 Tube secured with: Tape Dental Injury: Teeth and Oropharynx as per pre-operative assessment

## 2014-03-17 NOTE — Op Note (Signed)
NAMSander Radon:  Shrout, Janah             ACCOUNT NO.:  [id number]  MEDICAL RECORD NO.:  [id number]  LOCATION:                                 FACILITY:  PHYSICIAN:  Artist PaisMatthew A. Falicia Lizotte, M.D.DATE OF BIRTH:  [birth year]  DATE OF PROCEDURE:  03/16/2014 DATE OF DISCHARGE:  03/16/2014                              OPERATIVE REPORT   PREOPERATIVE DIAGNOSIS:  Displaced distal ulna fracture, left side.  POSTOPERATIVE DIAGNOSIS:  Displaced distal ulna fracture, left side.  PROCEDURES:  Open reduction and internal fixation above using a 5-hole DCP plate from the DePuy small fragment set.  SURGEON:  Artist PaisMatthew A. Mina MarbleWeingold, M.D.  ASSISTANT:  None.  ANESTHESIA:  General.  COMPLICATIONS:  No complications.  DRAINS:  No drains.  DESCRIPTION OF PROCEDURE:  The patient was taken to the operating suite. After induction of adequate general anesthesia, left upper extremity was prepped and draped in usual sterile fashion.  An Esmarch was used to exsanguinate the limb.  Tourniquet was inflated to 250 mmHg.  At this point in time, an incision made over the palpable border the distal third of the ulna, skin was incised sharply.  Dissection was carried down between the ECU and the FCU, this was incised, the fracture site was identified, debrided of clot.  Once this was done, reduction was performed with a reduction clamp, and a 5-hole plate was placed with 2 screws distal to the fracture site, 3 proximal to the fracture site under direct and fluoroscopic guidance.  Intraoperative fluoroscopy revealed adequate reduction in AP, lateral, and oblique view.  The wound was irrigated and then closed loosely with 2-0 undyed Vicryl and 3-0 Prolene subcuticular stitch on the skin.  Steri-Strips, 4x4s, fluffs, and a sugar-tong splint was applied.  The patient tolerated the procedure well, went to recovery room in a stable fashion.     Artist PaisMatthew A. Mina MarbleWeingold, M.D.   ______________________________ Artist PaisMatthew A.  Mina MarbleWeingold, M.D.    MAW/MEDQ  D:  03/16/2014  T:  03/17/2014  Job:  [id number]

## 2014-03-18 ENCOUNTER — Encounter (HOSPITAL_BASED_OUTPATIENT_CLINIC_OR_DEPARTMENT_OTHER): Payer: Self-pay | Admitting: Orthopedic Surgery

## 2014-03-26 ENCOUNTER — Other Ambulatory Visit (HOSPITAL_COMMUNITY): Payer: Self-pay | Admitting: Obstetrics and Gynecology

## 2014-03-28 ENCOUNTER — Emergency Department (INDEPENDENT_AMBULATORY_CARE_PROVIDER_SITE_OTHER)
Admission: EM | Admit: 2014-03-28 | Discharge: 2014-03-28 | Disposition: A | Discharge status: Home / Self Care | Payer: Medicaid Other | Source: Home / Self Care | Attending: Emergency Medicine | Admitting: Emergency Medicine

## 2014-03-28 ENCOUNTER — Other Ambulatory Visit (HOSPITAL_COMMUNITY)
Admission: RE | Admit: 2014-03-28 | Discharge: 2014-03-28 | Disposition: A | Discharge status: Home / Self Care | Payer: Medicaid Other | Source: Ambulatory Visit | Attending: Emergency Medicine | Admitting: Emergency Medicine

## 2014-03-28 ENCOUNTER — Encounter (HOSPITAL_COMMUNITY): Payer: Self-pay | Admitting: Emergency Medicine

## 2014-03-28 DIAGNOSIS — N760 Acute vaginitis: Secondary | ICD-10-CM | POA: Insufficient documentation

## 2014-03-28 DIAGNOSIS — N920 Excessive and frequent menstruation with regular cycle: Secondary | ICD-10-CM

## 2014-03-28 DIAGNOSIS — N730 Acute parametritis and pelvic cellulitis: Secondary | ICD-10-CM

## 2014-03-28 DIAGNOSIS — Z113 Encounter for screening for infections with a predominantly sexual mode of transmission: Secondary | ICD-10-CM | POA: Insufficient documentation

## 2014-03-28 LAB — POCT URINALYSIS DIP (DEVICE)
Glucose, UA: NEGATIVE mg/dL
Ketones, ur: NEGATIVE mg/dL
Leukocytes, UA: NEGATIVE
Nitrite: NEGATIVE
Protein, ur: NEGATIVE mg/dL
Specific Gravity, Urine: >=1.03 (ref 1.005–1.030)
Urobilinogen, UA: 0.2 mg/dL (ref 0.0–1.0)
pH: 5 (ref 5.0–8.0)

## 2014-03-28 LAB — POCT PREGNANCY, URINE: Preg Test, Ur: NEGATIVE

## 2014-03-28 MED ORDER — CIPROFLOXACIN HCL 500 MG PO TABS: 500.0000 mg | ORAL_TABLET | Freq: Two times a day (BID) | ORAL | Status: DC

## 2014-03-28 MED ORDER — HYDROCODONE-ACETAMINOPHEN 5-325 MG PO TABS
ORAL_TABLET | ORAL | Status: AC
  Filled 2014-03-28: qty 2

## 2014-03-28 MED ORDER — AZITHROMYCIN 250 MG PO TABS
1000.0000 mg | ORAL_TABLET | Freq: Once | ORAL | Status: AC
  Administered 2014-03-28: 1000 mg via ORAL

## 2014-03-28 MED ORDER — CEFTRIAXONE SODIUM 250 MG IJ SOLR
250.0000 mg | Freq: Once | INTRAMUSCULAR | Status: AC
  Administered 2014-03-28: 250 mg via INTRAMUSCULAR

## 2014-03-28 MED ORDER — LIDOCAINE HCL (PF) 1 % IJ SOLN
INTRAMUSCULAR | Status: AC
  Filled 2014-03-28: qty 5

## 2014-03-28 MED ORDER — AZITHROMYCIN 250 MG PO TABS
ORAL_TABLET | ORAL | Status: AC
  Filled 2014-03-28: qty 4

## 2014-03-28 MED ORDER — HYDROCODONE-ACETAMINOPHEN 5-325 MG PO TABS
2.0000 | ORAL_TABLET | Freq: Once | ORAL | Status: AC
  Administered 2014-03-28: 2 via ORAL

## 2014-03-28 MED ORDER — DOXYCYCLINE HYCLATE 100 MG PO TABS: 100.0000 mg | ORAL_TABLET | Freq: Two times a day (BID) | ORAL | Status: DC

## 2014-03-28 MED ORDER — OXYCODONE-ACETAMINOPHEN 5-325 MG PO TABS: ORAL_TABLET | ORAL | Status: DC

## 2014-03-28 MED ORDER — CEFTRIAXONE SODIUM 250 MG IJ SOLR
INTRAMUSCULAR | Status: AC
  Filled 2014-03-28: qty 250

## 2014-03-28 NOTE — ED Provider Notes (Signed)
Chief Complaint   Chief Complaint  Patient presents with  . Hematuria    History of Present Illness   Leah Barry is a 28 year old female who's had a three-day history of hematuria, dysuria, frequency, and urgency. She has also had left lower quadrant pain, vaginal odor, and leading. Her last menstrual period was about 2 weeks ago, so the bleeding is early. Psych normal menses. She's had a history of urinary tract infection PID. She denies fever, chills, nausea, or vomiting she's had no lower back pain. She's had a tubal ligation.  Review of Systems   Other than as noted above, the patient denies any of the following symptoms: Systemic:  No fever or chills GI:  No abdominal pain, nausea, vomiting, diarrhea, constipation, melena or hematochezia. GU:  No dysuria, frequency, urgency, hematuria, vaginal discharge, itching, or abnormal vaginal bleeding.  PMFSH   Past medical history, family history, social history, meds, and allergies were reviewed.  She is allergic to Flagyl and Bactrim.  Physical Examination    Vital signs:  BP 121/79  Pulse 67  Temp(Src) 98.3 F (36.8 C) (Oral)  Resp 18  SpO2 100%  LMP 03/05/2014 General:  Alert, oriented and in no distress. Lungs:  Breath sounds clear and equal bilaterally.  No wheezes, rales or rhonchi. Heart:  Regular rhythm.  No gallops or murmers. Abdomen:  Soft, flat and non-distended.  No organomegaly or mass.  No tenderness, guarding or rebound.  Bowel sounds normally active. Pelvic exam:  Normal external genitalia, there was blood in the vaginal vault. No discharge. No pain on cervical motion. Uterus is enlarged and irregular and mildly tender. There is no right adnexal tenderness or mass. The left adnexa is full, but I'm not sure whether this is the uterus or the adnexa, but it is tender..  DNA probes for gonorrhea, Chlamydia, Trichomonas, Gardnerella, Candida were obtained. Skin:  Clear, warm and dry.  Labs   Results for  orders placed during the hospital encounter of 03/28/14  POCT URINALYSIS DIP (DEVICE)      Result Value Ref Range   Glucose, UA NEGATIVE  NEGATIVE mg/dL   Bilirubin Urine SMALL (*) NEGATIVE   Ketones, ur NEGATIVE  NEGATIVE mg/dL   Specific Gravity, Urine >=1.030  1.005 - 1.030   Hgb urine dipstick LARGE (*) NEGATIVE   pH 5.0  5.0 - 8.0   Protein, ur NEGATIVE  NEGATIVE mg/dL   Urobilinogen, UA 0.2  0.0 - 1.0 mg/dL   Nitrite NEGATIVE  NEGATIVE   Leukocytes, UA NEGATIVE  NEGATIVE  POCT PREGNANCY, URINE      Result Value Ref Range   Preg Test, Ur NEGATIVE  NEGATIVE     Course in Urgent Care Center   Given Rocephin 250 mg IM and azithromycin 1000 mg by mouth.  Assessment   The primary encounter diagnosis was PID (acute pelvic inflammatory disease). A diagnosis of Menorrhagia was also pertinent to this visit.  Differential diagnosis includes PID, ovarian cysts, degenerating fibroid tumor, UTI, or kidney stone. DNA probes and cultures are pending. Suggested followup with GYN for ultrasound. Will cover with doxycycline for PID and Cipro for urinary tract infection. Suggested that if symptoms get worse to go directly to the emergency room for further evaluation.       Plan    1.  Meds:  The following meds were prescribed:   Discharge Medication List as of 03/28/2014  1:09 PM    START taking these medications   Details  ciprofloxacin (  CIPRO) 500 MG tablet Take 1 tablet (500 mg total) by mouth every 12 (twelve) hours., Starting 03/28/2014, Until Discontinued, Normal    doxycycline (VIBRA-TABS) 100 MG tablet Take 1 tablet (100 mg total) by mouth 2 (two) times daily., Starting 03/28/2014, Until Discontinued, Normal    !! oxyCODONE-acetaminophen (PERCOCET) 5-325 MG per tablet 1 to 2 tablets every 6 hours as needed for pain., Print     !! - Potential duplicate medications found. Please discuss with provider.      2.  Patient Education/Counseling:  The patient was given appropriate  handouts, self care instructions, and instructed in symptomatic relief.    3.  Follow up:  The patient was told to follow up here if no better in 3 to 4 days, or sooner if becoming worse in any way, and given some red flag symptoms such as worsening pain, fever, persistent vomiting, or heavy vaginal bleeding which would prompt immediate return.       Reuben Likesavid C Keric Zehren, MD 03/28/14 [phone]

## 2014-03-28 NOTE — ED Notes (Signed)
3 days of dysuria, seeing dark urine--she thinks it is blood, and  Left pelvic pain

## 2014-03-28 NOTE — Discharge Instructions (Signed)
Abnormal Uterine Bleeding Abnormal uterine bleeding can affect women at various stages in life, including teenagers, women in their reproductive years, pregnant women, and women who have reached menopause. Several kinds of uterine bleeding are considered abnormal, including:  Bleeding or spotting between periods.   Bleeding after sexual intercourse.   Bleeding that is heavier or more than normal.   Periods that last longer than usual.  Bleeding after menopause.  Many cases of abnormal uterine bleeding are minor and simple to treat, while others are more serious. Any type of abnormal bleeding should be evaluated by your health care provider. Treatment will depend on the cause of the bleeding. HOME CARE INSTRUCTIONS Monitor your condition for any changes. The following actions may help to alleviate any discomfort you are experiencing:  Avoid the use of tampons and douches as directed by your health care provider.  Change your pads frequently. You should get regular pelvic exams and Pap tests. Keep all follow-up appointments for diagnostic tests as directed by your health care provider.  SEEK MEDICAL CARE IF:   Your bleeding lasts more than 1 week.   You feel dizzy at times.  SEEK IMMEDIATE MEDICAL CARE IF:   You pass out.   You are changing pads every 15 to 30 minutes.   You have abdominal pain.  You have a fever.   You become sweaty or weak.   You are passing large blood clots from the vagina.   You start to feel nauseous and vomit. MAKE SURE YOU:   Understand these instructions.  Will watch your condition.  Will get help right away if you are not doing well or get worse. Document Released: 10/30/2005 Document Revised: 07/02/2013 Document Reviewed: 05/29/2013 [facility] Patient Information 2014 [place], [place].  Pelvic Inflammatory Disease Pelvic inflammatory disease (PID) refers to an infection in some or all of the female organs. The infection can be in  the uterus, ovaries, fallopian tubes, or the surrounding tissues in the pelvis. PID can cause abdominal or pelvic pain that comes on suddenly (acute pelvic pain). PID is a serious infection because it can lead to lasting (chronic) pelvic pain or the inability to have children (infertile).  CAUSES  The infection is often caused by the normal bacteria found in the vaginal tissues. PID may also be caused by an infection that is spread during sexual contact. PID can also occur following:   The birth of a baby.   A miscarriage.   An abortion.   Major pelvic surgery.   The use of an intrauterine device (IUD).   A sexual assault.  RISK FACTORS Certain factors can put a person at higher risk for PID, such as:  Being younger than 25 years.  Being sexually active at [place] age.  Usingnonbarrier contraception.  Havingmultiple sexual partners.  Having sex with someone who has symptoms of a genital infection.  Using oral contraception. Other times, certain behaviors can increase the possibility of getting PID, such as:  Having sex during your period.  Using a vaginal douche.  Having an intrauterine device (IUD) in place. SYMPTOMS   Abdominal or pelvic pain.   Fever.   Chills.   Abnormal vaginal discharge.  Abnormal uterine bleeding.   Unusual pain shortly after finishing your period. DIAGNOSIS  Your caregiver will choose some of the following methods to make a diagnosis, such as:   Performinga physical exam and history. A pelvic exam typically reveals a very tender uterus and surrounding pelvis.   Ordering laboratory tests including  a pregnancy test, blood tests, and urine test.  Orderingcultures of the vagina and cervix to check for a sexually transmitted infection (STI).  Performing an ultrasound.   Performing a laparoscopic procedure to look inside the pelvis.  TREATMENT   Antibiotic medicines may be prescribed and taken by mouth.   Sexual  partners may be treated when the infection is caused by a sexually transmitted disease (STD).   Hospitalization may be needed to give antibiotics intravenously.  Surgery may be needed, but this is rare. It may take weeks until you are completely well. If you are diagnosed with PID, you should also be checked for human immunodeficiency virus (HIV). HOME CARE INSTRUCTIONS   If given, take your antibiotics as directed. Finish the medicine even if you start to feel better.   Only take over-the-counter or prescription medicines for pain, discomfort, or fever as directed by your caregiver.   Do not have sexual intercourse until treatment is completed or as directed by your caregiver. If PID is confirmed, your recent sexual partner(s) will need treatment.   Keep your follow-up appointments. SEEK MEDICAL CARE IF:   You have increased or abnormal vaginal discharge.   You need prescription medicine for your pain.   You vomit.   You cannot take your medicines.   Your partner has an STD.  SEEK IMMEDIATE MEDICAL CARE IF:   You have a fever.   You have increased abdominal or pelvic pain.   You have chills.   You have pain when you urinate.   You are not better after 72 hours following treatment.  MAKE SURE YOU:   Understand these instructions.  Will watch your condition.  Will get help right away if you are not doing well or get worse. Document Released: 10/30/2005 Document Revised: 02/24/2013 Document Reviewed: 10/26/2011 [facility] Patient Information 2014 [place], [place]LL[place].

## 2014-03-30 LAB — URINE CULTURE: Colony Count: >=100000

## 2014-04-14 ENCOUNTER — Encounter: Payer: Self-pay | Admitting: Obstetrics

## 2014-04-14 ENCOUNTER — Ambulatory Visit (INDEPENDENT_AMBULATORY_CARE_PROVIDER_SITE_OTHER): Payer: Medicaid Other | Admitting: Obstetrics

## 2014-04-14 VITALS — BP 125/81 | HR 68 | Temp 98.2°F | Wt 173.0 lb

## 2014-04-14 DIAGNOSIS — B3731 Acute candidiasis of vulva and vagina: Secondary | ICD-10-CM

## 2014-04-14 DIAGNOSIS — R3989 Other symptoms and signs involving the genitourinary system: Secondary | ICD-10-CM

## 2014-04-14 DIAGNOSIS — B373 Candidiasis of vulva and vagina: Secondary | ICD-10-CM

## 2014-04-14 DIAGNOSIS — N739 Female pelvic inflammatory disease, unspecified: Secondary | ICD-10-CM

## 2014-04-14 DIAGNOSIS — R399 Unspecified symptoms and signs involving the genitourinary system: Secondary | ICD-10-CM | POA: Insufficient documentation

## 2014-04-14 DIAGNOSIS — R52 Pain, unspecified: Secondary | ICD-10-CM | POA: Insufficient documentation

## 2014-04-14 LAB — POCT URINALYSIS DIPSTICK
Bilirubin, UA: NEGATIVE
Blood, UA: NEGATIVE
Glucose, UA: NEGATIVE
Ketones, UA: NEGATIVE
Leukocytes, UA: NEGATIVE
Nitrite, UA: NEGATIVE
Protein, UA: NEGATIVE
Spec Grav, UA: 1.025
Urobilinogen, UA: NEGATIVE
pH, UA: 5

## 2014-04-14 MED ORDER — AMOXICILLIN-POT CLAVULANATE 875-125 MG PO TABS: 1.0000 | ORAL_TABLET | Freq: Two times a day (BID) | ORAL | Status: DC

## 2014-04-14 MED ORDER — OXYCODONE HCL 10 MG PO TABS: 10.0000 mg | ORAL_TABLET | Freq: Four times a day (QID) | ORAL | Status: DC | PRN

## 2014-04-14 MED ORDER — FLUCONAZOLE 150 MG PO TABS: 150.0000 mg | ORAL_TABLET | Freq: Once | ORAL | Status: DC

## 2014-04-15 ENCOUNTER — Encounter: Payer: Self-pay | Admitting: Obstetrics

## 2014-04-15 LAB — URINE CULTURE
Colony Count: NO GROWTH
Organism ID, Bacteria: NO GROWTH

## 2014-04-15 LAB — WET PREP BY MOLECULAR PROBE
Candida species: NEGATIVE
Gardnerella vaginalis: POSITIVE — AB
Trichomonas vaginosis: NEGATIVE

## 2014-04-15 LAB — GC/CHLAMYDIA PROBE AMP
CT Probe RNA: NEGATIVE
GC Probe RNA: NEGATIVE

## 2014-04-15 NOTE — Progress Notes (Signed)
Patient ID: Leah Barry, female   DOB: 01/25/1986, 28 y.o.   MRN: [id number]  Chief Complaint  Patient presents with  . Personal Problem    HPI Leah Barry is a 28 y.o. female.  Presents with lower abdominal pain and vaginal discharge and irritation after intercourse with new partner.  HPI  Past Medical History  Diagnosis Date  . Anxiety   . Depression   . Bipolar disorder     no current med.  . Irritable bowel syndrome (IBS)     no current med.  [name]. Ulnar fracture 03/09/2014    left  . Scalp laceration     staples to be removed 03/13/2014  . History of MRSA infection 2010  . Apnea, sleep     no CPAP use, states lost machine during move to [place]    Past Surgical History  Procedure Laterality Date  . Ovarian cyst removal    . Tubal ligation    . Thyroid cyst excision      nodule exc.  [name]. Cyst excision      middle of back  . Orif ulnar fracture Left 03/16/2014    Procedure: OPEN REDUCTION INTERNAL FIXATION (ORIF) LEFT ULNAR FRACTURE;  Surgeon: Marlowe ShoresMatthew A Weingold, MD;  Location: [facility];  Service: Orthopedics;  Laterality: Left;  left    Family History  Problem Relation Age of Onset  . Heart disease Mother   . Cancer Maternal Grandmother     breast  . Cancer Paternal Grandmother     breast    Social History History  Substance Use Topics  . Smoking status: Current Every Day Smoker -- 3 years    Types: Cigarettes  . Smokeless tobacco: Never Used     Comment: 1-2 cig./day  . Alcohol Use: No    Allergies  Allergen Reactions  . Flagyl [Metronidazole] Nausea And Vomiting  . Naproxen Other (See Comments)    NOSEBLEED  . Sulfa Antibiotics Swelling    SWELLING OF EYES    Current Outpatient Prescriptions  Medication Sig Dispense Refill  . ciprofloxacin (CIPRO) 500 MG tablet Take 1 tablet (500 mg total) by mouth every 12 (twelve) hours.  20 tablet  0  . ibuprofen (ADVIL,MOTRIN) 600 MG tablet Take 600 mg by mouth every 6 (six) hours as needed  for mild pain.      [name]. amoxicillin-clavulanate (AUGMENTIN) 875-125 MG per tablet Take 1 tablet by mouth 2 (two) times daily.  28 tablet  1  . doxycycline (VIBRA-TABS) 100 MG tablet Take 1 tablet (100 mg total) by mouth 2 (two) times daily.  20 tablet  0  . fluconazole (DIFLUCAN) 150 MG tablet Take 1 tablet (150 mg total) by mouth once.  1 tablet  2  . Oxycodone HCl 10 MG TABS Take 1 tablet (10 mg total) by mouth every 6 (six) hours as needed.  40 tablet  0   No current facility-administered medications for this visit.    Review of Systems Review of Systems Constitutional: negative for fatigue and weight loss Respiratory: negative for cough and wheezing Cardiovascular: negative for chest pain, fatigue and palpitations Gastrointestinal: negative for abdominal pain and change in bowel habits Genitourinary:negative Integument/breast: negative for nipple discharge Musculoskeletal:negative for myalgias Neurological: negative for gait problems and tremors Behavioral/Psych: negative for abusive relationship, depression Endocrine: negative for temperature intolerance     Blood pressure 125/81, pulse 68, temperature 98.2 F (36.8 C), weight 173 lb (78.472 kg).  Physical Exam Physical Exam General:  alert  Skin:   no rash or abnormalities  Lungs:   clear to auscultation bilaterally  Heart:   regular rate and rhythm, S1, S2 normal, no murmur, click, rub or gallop  Breasts:   normal without suspicious masses, skin or nipple changes or axillary nodes  Abdomen:  normal findings: no organomegaly, soft, non-tender and no hernia  Pelvis:  External genitalia: normal general appearance Urinary system: urethral meatus normal and bladder without fullness, nontender Vaginal:  Discharge, white and malodorous Cervix: normal appearance Adnexa: normal bimanual exam Uterus: anteverted, NSSC, tender      Data Reviewed Labs  Assessment    PID     Plan    Augmentin Rx x 14 days. Oxycodone  Rx F/U in 2 weeks Orders Placed This Encounter  Procedures  . Urine culture  . WET PREP BY MOLECULAR PROBE  . GC/Chlamydia Probe Amp  . POCT urinalysis dipstick   Meds ordered this encounter  Medications  . amoxicillin-clavulanate (AUGMENTIN) 875-125 MG per tablet    Sig: Take 1 tablet by mouth 2 (two) times daily.    Dispense:  28 tablet    Refill:  1  . fluconazole (DIFLUCAN) 150 MG tablet    Sig: Take 1 tablet (150 mg total) by mouth once.    Dispense:  1 tablet    Refill:  2  . Oxycodone HCl 10 MG TABS    Sig: Take 1 tablet (10 mg total) by mouth every 6 (six) hours as needed.    Dispense:  40 tablet    Refill:  0        Brock Bad 04/15/2014, 4:34 AM

## 2014-04-16 ENCOUNTER — Telehealth: Payer: Self-pay | Admitting: *Deleted

## 2014-04-16 NOTE — Telephone Encounter (Signed)
Patient called regarding lab results. Patient notified of lab results and advised to continue taking the Augmentin until finished and then once finished to take the Diflucan.

## 2014-04-28 ENCOUNTER — Encounter: Payer: Self-pay | Admitting: Advanced Practice Midwife

## 2014-04-28 ENCOUNTER — Ambulatory Visit (INDEPENDENT_AMBULATORY_CARE_PROVIDER_SITE_OTHER): Payer: Medicaid Other | Admitting: Obstetrics

## 2014-04-28 ENCOUNTER — Ambulatory Visit: Payer: Medicaid Other | Admitting: Obstetrics

## 2014-04-28 VITALS — BP 127/83 | HR 79 | Temp 97.5°F | Ht 67.0 in | Wt 168.0 lb

## 2014-04-28 DIAGNOSIS — Z0000 Encounter for general adult medical examination without abnormal findings: Secondary | ICD-10-CM

## 2014-04-28 DIAGNOSIS — N731 Chronic parametritis and pelvic cellulitis: Secondary | ICD-10-CM

## 2014-04-28 DIAGNOSIS — R52 Pain, unspecified: Secondary | ICD-10-CM

## 2014-04-28 DIAGNOSIS — N739 Female pelvic inflammatory disease, unspecified: Secondary | ICD-10-CM

## 2014-04-28 LAB — POCT URINALYSIS DIPSTICK
Bilirubin, UA: NEGATIVE
Blood, UA: NEGATIVE
Glucose, UA: NEGATIVE
Ketones, UA: NEGATIVE
Leukocytes, UA: NEGATIVE
Nitrite, UA: NEGATIVE
Protein, UA: NEGATIVE
Spec Grav, UA: 1.025
Urobilinogen, UA: NEGATIVE
pH, UA: 5

## 2014-04-28 MED ORDER — CITRANATAL HARMONY 27-1-260 MG PO CAPS
1.0000 | ORAL_CAPSULE | Freq: Every day | ORAL | Status: DC
Start: 2014-04-28 — End: 2014-05-27

## 2014-04-28 MED ORDER — LEVOFLOXACIN 750 MG PO TABS: 750.0000 mg | ORAL_TABLET | Freq: Every day | ORAL | Status: DC

## 2014-04-28 MED ORDER — OXYCODONE HCL 10 MG PO TABS: 10.0000 mg | ORAL_TABLET | Freq: Four times a day (QID) | ORAL | Status: DC | PRN

## 2014-04-28 NOTE — Progress Notes (Signed)
Patient ID: Leah Barry, female   DOB: 01/22/1986, 28 y.o.   MRN: [id number]  Chief Complaint  Patient presents with  . Follow-up    Seen about 2 weeks ago with pelvic pain and states she has conttinued to have the pain.    HPI Leah FatDominique V Fosdick is a 28 y.o. female.   Still has pain, particularly with intercourse.  HPI  Past Medical History  Diagnosis Date  . Anxiety   . Depression   . Bipolar disorder     no current med.  . Irritable bowel syndrome (IBS)     no current med.  [name]. Ulnar fracture 03/09/2014    left  . Scalp laceration     staples to be removed 03/13/2014  . History of MRSA infection 2010  . Apnea, sleep     no CPAP use, states lost machine during move to [place]    Past Surgical History  Procedure Laterality Date  . Ovarian cyst removal    . Tubal ligation    . Thyroid cyst excision      nodule exc.  [name]. Cyst excision      middle of back  . Orif ulnar fracture Left 03/16/2014    Procedure: OPEN REDUCTION INTERNAL FIXATION (ORIF) LEFT ULNAR FRACTURE;  Surgeon: Marlowe ShoresMatthew A Weingold, MD;  Location: [facility];  Service: Orthopedics;  Laterality: Left;  left    Family History  Problem Relation Age of Onset  . Heart disease Mother   . Cancer Maternal Grandmother     breast  . Cancer Paternal Grandmother     breast    Social History History  Substance Use Topics  . Smoking status: Current Some Day Smoker -- 3 years    Types: Cigarettes  . Smokeless tobacco: Never Used     Comment: 1-2 cig./day  . Alcohol Use: No    Allergies  Allergen Reactions  . Flagyl [Metronidazole] Nausea And Vomiting  . Naproxen Other (See Comments)    NOSEBLEED  . Sulfa Antibiotics Swelling    SWELLING OF EYES    Current Outpatient Prescriptions  Medication Sig Dispense Refill  . ibuprofen (ADVIL,MOTRIN) 600 MG tablet Take 600 mg by mouth every 6 (six) hours as needed for mild pain.      [name]. amoxicillin-clavulanate (AUGMENTIN) 875-125 MG per tablet Take 1 tablet  by mouth 2 (two) times daily.  28 tablet  1  . Oxycodone HCl 10 MG TABS Take 1 tablet (10 mg total) by mouth every 6 (six) hours as needed.  40 tablet  0   No current facility-administered medications for this visit.    Review of Systems Review of Systems Constitutional: negative for fatigue and weight loss Respiratory: negative for cough and wheezing Cardiovascular: negative for chest pain, fatigue and palpitations Gastrointestinal: negative for abdominal pain and change in bowel habits Genitourinary:  Pelvic pain Integument/breast: negative for nipple discharge Musculoskeletal:negative for myalgias Neurological: negative for gait problems and tremors Behavioral/Psych: negative for abusive relationship, depression Endocrine: negative for temperature intolerance     Blood pressure 127/83, pulse 79, temperature 97.5 F (36.4 C), height 5\' 7"  (1.702 m), weight 168 lb (76.204 kg), last menstrual period 04/13/2014.  Physical Exam Physical Exam General:   alert  Skin:   no rash or abnormalities  Lungs:   clear to auscultation bilaterally  Heart:   regular rate and rhythm, S1, S2 normal, no murmur, click, rub or gallop  Breasts:   normal without suspicious masses, skin or  nipple changes or axillary nodes  Abdomen:  normal findings: no organomegaly, soft, non-tender and no hernia  Pelvis:  External genitalia: normal general appearance Urinary system: urethral meatus normal and bladder without fullness, nontender Vaginal: normal without tenderness, induration or masses Cervix: normal appearance Adnexa: no tenderness.  No masses. Uterus: anteverted, tender, normal size      Data Reviewed Labs Ultrasound  Assessment    PID, chronic.    Plan  Continue antibiotics.    No orders of the defined types were placed in this encounter.   No orders of the defined types were placed in this encounter.    Need to obtain previous records Possible management options include:  Long  term antibiotics Follow up in 4 weeks.      HARPER,CHARLES A 04/28/2014, 4:21 PM

## 2014-05-27 ENCOUNTER — Ambulatory Visit (INDEPENDENT_AMBULATORY_CARE_PROVIDER_SITE_OTHER): Payer: Medicaid Other | Admitting: Obstetrics

## 2014-05-27 ENCOUNTER — Encounter: Payer: Self-pay | Admitting: Obstetrics

## 2014-05-27 VITALS — BP 121/83 | HR 92 | Temp 98.1°F | Ht 67.0 in | Wt 170.0 lb

## 2014-05-27 DIAGNOSIS — A6000 Herpesviral infection of urogenital system, unspecified: Secondary | ICD-10-CM

## 2014-05-27 DIAGNOSIS — A5400 Gonococcal infection of lower genitourinary tract, unspecified: Secondary | ICD-10-CM

## 2014-05-27 DIAGNOSIS — N946 Dysmenorrhea, unspecified: Secondary | ICD-10-CM

## 2014-05-27 DIAGNOSIS — A549 Gonococcal infection, unspecified: Secondary | ICD-10-CM

## 2014-05-27 DIAGNOSIS — Z202 Contact with and (suspected) exposure to infections with a predominantly sexual mode of transmission: Secondary | ICD-10-CM

## 2014-05-27 MED ORDER — CEFTRIAXONE SODIUM 1 G IJ SOLR
250.0000 mg | Freq: Once | INTRAMUSCULAR | Status: AC
  Administered 2014-05-27: 250 mg via INTRAMUSCULAR

## 2014-05-27 MED ORDER — AZITHROMYCIN 250 MG PO TABS: 1000.0000 mg | ORAL_TABLET | Freq: Once | ORAL | Status: DC

## 2014-05-27 MED ORDER — CEFIXIME 400 MG PO TABS: 400.0000 mg | ORAL_TABLET | Freq: Every day | ORAL | Status: DC

## 2014-05-27 MED ORDER — OXYCODONE HCL 10 MG PO TABS: 10.0000 mg | ORAL_TABLET | Freq: Four times a day (QID) | ORAL | Status: DC | PRN

## 2014-05-27 MED ORDER — CEFTRIAXONE SODIUM 250 MG IJ SOLR: 250.0000 mg | Freq: Once | INTRAMUSCULAR | Status: DC

## 2014-05-27 NOTE — Progress Notes (Signed)
Patient ID: Leah Barry, female   DOB: 07/24/1986, 28 y.o.   MRN: [id number]  Chief Complaint  Patient presents with  . Exposure to STD    Pt states she had a new partner and he was treated for Chlamydia    HPI Leah FatDominique V Grimley is a 28 y.o. female.  Chlamydia exposure.  Herpes outbreak.  HPI  Past Medical History  Diagnosis Date  . Anxiety   . Depression   . Bipolar disorder     no current med.  . Irritable bowel syndrome (IBS)     no current med.  [name]. Ulnar fracture 03/09/2014    left  . Scalp laceration     staples to be removed 03/13/2014  . History of MRSA infection 2010  . Apnea, sleep     no CPAP use, states lost machine during move to [place]    Past Surgical History  Procedure Laterality Date  . Ovarian cyst removal    . Tubal ligation    . Thyroid cyst excision      nodule exc.  [name]. Cyst excision      middle of back  . Orif ulnar fracture Left 03/16/2014    Procedure: OPEN REDUCTION INTERNAL FIXATION (ORIF) LEFT ULNAR FRACTURE;  Surgeon: Marlowe ShoresMatthew A Weingold, MD;  Location: [facility];  Service: Orthopedics;  Laterality: Left;  left    Family History  Problem Relation Age of Onset  . Heart disease Mother   . Cancer Maternal Grandmother     breast  . Cancer Paternal Grandmother     breast    Social History History  Substance Use Topics  . Smoking status: Current Some Day Smoker -- 3 years    Types: Cigarettes  . Smokeless tobacco: Never Used     Comment: 1-2 cig./day  . Alcohol Use: No    Allergies  Allergen Reactions  . Flagyl [Metronidazole] Nausea And Vomiting  . Naproxen Other (See Comments)    NOSEBLEED  . Sulfa Antibiotics Swelling    SWELLING OF EYES    Current Outpatient Prescriptions  Medication Sig Dispense Refill  . azithromycin (ZITHROMAX) 250 MG tablet Take 4 tablets (1,000 mg total) by mouth once.  4 tablet  0  . cefixime (SUPRAX) 400 MG tablet Take 1 tablet (400 mg total) by mouth daily.  1 tablet  0  . Oxycodone HCl  10 MG TABS Take 1 tablet (10 mg total) by mouth every 6 (six) hours as needed.  40 tablet  0   No current facility-administered medications for this visit.    Review of Systems Review of Systems Constitutional: negative for fatigue and weight loss Respiratory: negative for cough and wheezing Cardiovascular: negative for chest pain, fatigue and palpitations Gastrointestinal: negative for abdominal pain and change in bowel habits Genitourinary:  Pain with urination from herpes lesion in area Integument/breast: negative for nipple discharge Musculoskeletal:negative for myalgias Neurological: negative for gait problems and tremors Behavioral/Psych: negative for abusive relationship, depression Endocrine: negative for temperature intolerance     Blood pressure 121/83, pulse 92, temperature 98.1 F (36.7 C), height 5\' 7"  (1.702 m), weight 170 lb (77.111 kg), last menstrual period 05/11/2014.  Physical Exam Physical Exam General:   alert  Skin:   no rash or abnormalities  Lungs:   clear to auscultation bilaterally  Heart:   regular rate and rhythm, S1, S2 normal, no murmur, click, rub or gallop  Breasts:   normal without suspicious masses, skin or nipple changes or  axillary nodes  Abdomen:  normal findings: no organomegaly, soft, non-tender and no hernia  Pelvis:  External genitalia:  Ulceration right para clitoral area, tender.  Herpes Cx. Urinary system: urethral meatus normal and bladder without fullness, nontender Vaginal: normal without tenderness, induration or masses Cervix: normal appearance Adnexa:  Bilateral tenderness on bimanual examChlamydia Uterus: anteverted and tender, normal size     Data Reviewed Labs  Assessment    Chlamydia exposure Herpes outbreak     Plan    Azithromycin / Rocephin Rx Valtrex Rx F/U prn.  Orders Placed This Encounter  Procedures  . WET PREP BY MOLECULAR PROBE  . GC/Chlamydia Probe Amp   Meds ordered this encounter  Medications   . azithromycin (ZITHROMAX) 250 MG tablet    Sig: Take 4 tablets (1,000 mg total) by mouth once.    Dispense:  4 tablet    Refill:  0  . cefixime (SUPRAX) 400 MG tablet    Sig: Take 1 tablet (400 mg total) by mouth daily.    Dispense:  1 tablet    Refill:  0  . Oxycodone HCl 10 MG TABS    Sig: Take 1 tablet (10 mg total) by mouth every 6 (six) hours as needed.    Dispense:  40 tablet    Refill:  0       HARPER,CHARLES A 05/27/2014, 5:09 PM

## 2014-05-28 DIAGNOSIS — A549 Gonococcal infection, unspecified: Secondary | ICD-10-CM

## 2014-05-28 DIAGNOSIS — N946 Dysmenorrhea, unspecified: Secondary | ICD-10-CM | POA: Insufficient documentation

## 2014-05-28 DIAGNOSIS — Z202 Contact with and (suspected) exposure to infections with a predominantly sexual mode of transmission: Secondary | ICD-10-CM

## 2014-05-28 HISTORY — DX: Contact with and (suspected) exposure to infections with a predominantly sexual mode of transmission: Z20.2

## 2014-05-28 HISTORY — DX: Gonococcal infection, unspecified: A54.9

## 2014-05-28 LAB — WET PREP BY MOLECULAR PROBE
Candida species: NEGATIVE
Gardnerella vaginalis: POSITIVE — AB
Trichomonas vaginosis: NEGATIVE

## 2014-05-28 LAB — GC/CHLAMYDIA PROBE AMP
CT Probe RNA: NEGATIVE
GC Probe RNA: NEGATIVE

## 2014-06-01 ENCOUNTER — Other Ambulatory Visit: Payer: Self-pay | Admitting: *Deleted

## 2014-06-01 DIAGNOSIS — B9689 Other specified bacterial agents as the cause of diseases classified elsewhere: Secondary | ICD-10-CM

## 2014-06-01 DIAGNOSIS — N760 Acute vaginitis: Principal | ICD-10-CM

## 2014-06-01 MED ORDER — CLINDAMYCIN PHOSPHATE (1 DOSE) 2 % VA CREA: 1.0000 | TOPICAL_CREAM | Freq: Once | VAGINAL | Status: DC

## 2014-06-02 ENCOUNTER — Ambulatory Visit: Payer: Medicaid Other | Admitting: Obstetrics

## 2014-07-03 ENCOUNTER — Encounter (HOSPITAL_COMMUNITY): Payer: Self-pay | Admitting: Emergency Medicine

## 2014-07-03 ENCOUNTER — Emergency Department (INDEPENDENT_AMBULATORY_CARE_PROVIDER_SITE_OTHER)
Admission: EM | Admit: 2014-07-03 | Discharge: 2014-07-03 | Disposition: A | Discharge status: Other Acute Inpatient Hospital | Payer: Medicaid Other | Source: Home / Self Care | Attending: Family Medicine | Admitting: Family Medicine

## 2014-07-03 DIAGNOSIS — J020 Streptococcal pharyngitis: Secondary | ICD-10-CM

## 2014-07-03 LAB — POCT RAPID STREP A: Streptococcus, Group A Screen (Direct): POSITIVE — AB

## 2014-07-03 MED ORDER — CEFDINIR 300 MG PO CAPS: 300.0000 mg | ORAL_CAPSULE | Freq: Two times a day (BID) | ORAL | Status: DC

## 2014-07-03 NOTE — ED Provider Notes (Addendum)
CSN: [id number]     Arrival date & time 07/03/14  1033 History   None    Chief Complaint  Patient presents with  . Sore Throat   (Consider location/radiation/quality/duration/timing/severity/associated sxs/prior Treatment) Patient is a 28 y.o. female presenting with pharyngitis. The history is provided by the patient.  Sore Throat This is a new problem. The current episode started 2 days ago. The problem has been gradually worsening. Associated symptoms comments: Boyfriend with strep 1 week ago.. The symptoms are aggravated by swallowing.    Past Medical History  Diagnosis Date  . Anxiety   . Depression   . Bipolar disorder     no current med.  . Irritable bowel syndrome (IBS)     no current med.  [name]. Ulnar fracture 03/09/2014    left  . Scalp laceration     staples to be removed 03/13/2014  . History of MRSA infection 2010  . Apnea, sleep     no CPAP use, states lost machine during move to [place]   Past Surgical History  Procedure Laterality Date  . Ovarian cyst removal    . Tubal ligation    . Thyroid cyst excision      nodule exc.  [name]. Cyst excision      middle of back  . Orif ulnar fracture Left 03/16/2014    Procedure: OPEN REDUCTION INTERNAL FIXATION (ORIF) LEFT ULNAR FRACTURE;  Surgeon: Marlowe ShoresMatthew A Weingold, MD;  Location: [facility];  Service: Orthopedics;  Laterality: Left;  left   Family History  Problem Relation Age of Onset  . Heart disease Mother   . Cancer Maternal Grandmother     breast  . Cancer Paternal Grandmother     breast   History  Substance Use Topics  . Smoking status: Current Some Day Smoker -- 3 years    Types: Cigarettes  . Smokeless tobacco: Never Used     Comment: 1-2 cig./day  . Alcohol Use: No   OB History   Grav Para Term Preterm Abortions TAB SAB Ect Mult Living   4 4 4       4      Review of Systems  Constitutional: Positive for fever and chills.  HENT: Positive for sore throat.   Gastrointestinal: Negative.   Skin:  Negative.     Allergies  Flagyl; Naproxen; and Sulfa antibiotics  Home Medications   Prior to Admission medications   Medication Sig Start Date End Date Taking? Authorizing Provider  azithromycin (ZITHROMAX) 250 MG tablet Take 4 tablets (1,000 mg total) by mouth once. 05/27/14   Brock Badharles A Harper, MD  cefdinir (OMNICEF) 300 MG capsule Take 1 capsule (300 mg total) by mouth 2 (two) times daily. 07/03/14   Linna HoffJames D Teyana Pierron, MD  cefixime (SUPRAX) 400 MG tablet Take 1 tablet (400 mg total) by mouth daily. 05/27/14   Brock Badharles A Harper, MD  Clindamycin Phosphate, 1 Dose, vaginal cream Apply 1 applicator topically once. Insert applicator into the vagina for a one time dosing 06/01/14   Brock Badharles A Harper, MD  Oxycodone HCl 10 MG TABS Take 1 tablet (10 mg total) by mouth every 6 (six) hours as needed. 05/27/14   Brock Badharles A Harper, MD   BP 125/82  Pulse 78  Temp(Src) 98.4 F (36.9 C) (Oral)  Resp 16  SpO2 100%  LMP 06/12/2014 Physical Exam  Nursing note and vitals reviewed. Constitutional: She is oriented to person, place, and time. She appears well-developed and well-nourished.  HENT:  Mouth/Throat:  Uvula is midline and mucous membranes are normal. Oropharyngeal exudate and posterior oropharyngeal erythema present.  Neck: Normal range of motion. Neck supple.  Pulmonary/Chest: Effort normal and breath sounds normal.  Lymphadenopathy:    She has cervical adenopathy.  Neurological: She is alert and oriented to person, place, and time.  Skin: Skin is warm and dry. No rash noted.    ED Course  Procedures (including critical care time) Labs Review Labs Reviewed  POCT RAPID STREP A ([facility] ONLY) - Abnormal; Notable for the following:    Streptococcus, Group A Screen (Direct) POSITIVE (*)    All other components within normal limits    Imaging Review No results found.   MDM   1. Strep sore throat        Linna Hoff, MD 07/03/14 1145  Linna Hoff, MD 07/04/14 [phone]

## 2014-07-03 NOTE — ED Notes (Signed)
Pt  Reports  sorethroat      Body  Aches       With  Symptoms          X 2  Days                She reports           Pain  When  She  Swallows                  Pt  Reports  The  Symptoms  Not releived  By  Motrin

## 2014-07-03 NOTE — Discharge Instructions (Signed)
Drink lots of fluids, take all of medicine, use lozenges as needed.return if needed °

## 2014-07-04 ENCOUNTER — Encounter (HOSPITAL_COMMUNITY): Payer: Self-pay | Admitting: Emergency Medicine

## 2014-07-04 ENCOUNTER — Emergency Department (HOSPITAL_COMMUNITY)
Admission: EM | Admit: 2014-07-04 | Discharge: 2014-07-05 | Disposition: A | Discharge status: Home / Self Care | Payer: Medicaid Other | Attending: Emergency Medicine | Admitting: Emergency Medicine

## 2014-07-04 DIAGNOSIS — Z8719 Personal history of other diseases of the digestive system: Secondary | ICD-10-CM | POA: Diagnosis not present

## 2014-07-04 DIAGNOSIS — Z3202 Encounter for pregnancy test, result negative: Secondary | ICD-10-CM | POA: Insufficient documentation

## 2014-07-04 DIAGNOSIS — R1011 Right upper quadrant pain: Secondary | ICD-10-CM | POA: Diagnosis not present

## 2014-07-04 DIAGNOSIS — Z9889 Other specified postprocedural states: Secondary | ICD-10-CM | POA: Diagnosis not present

## 2014-07-04 DIAGNOSIS — R51 Headache: Secondary | ICD-10-CM | POA: Diagnosis not present

## 2014-07-04 DIAGNOSIS — Z8659 Personal history of other mental and behavioral disorders: Secondary | ICD-10-CM | POA: Diagnosis not present

## 2014-07-04 DIAGNOSIS — F17200 Nicotine dependence, unspecified, uncomplicated: Secondary | ICD-10-CM | POA: Diagnosis not present

## 2014-07-04 DIAGNOSIS — Z87828 Personal history of other (healed) physical injury and trauma: Secondary | ICD-10-CM | POA: Diagnosis not present

## 2014-07-04 DIAGNOSIS — G4489 Other headache syndrome: Secondary | ICD-10-CM

## 2014-07-04 DIAGNOSIS — Z8614 Personal history of Methicillin resistant Staphylococcus aureus infection: Secondary | ICD-10-CM | POA: Insufficient documentation

## 2014-07-04 DIAGNOSIS — R109 Unspecified abdominal pain: Secondary | ICD-10-CM | POA: Diagnosis present

## 2014-07-04 DIAGNOSIS — Z792 Long term (current) use of antibiotics: Secondary | ICD-10-CM | POA: Insufficient documentation

## 2014-07-04 LAB — PREGNANCY, URINE: Preg Test, Ur: NEGATIVE

## 2014-07-04 LAB — URINALYSIS, ROUTINE W REFLEX MICROSCOPIC
Bilirubin Urine: NEGATIVE
Glucose, UA: NEGATIVE mg/dL
Ketones, ur: NEGATIVE mg/dL
Nitrite: NEGATIVE
Protein, ur: NEGATIVE mg/dL
Specific Gravity, Urine: 1.019 (ref 1.005–1.030)
Urobilinogen, UA: 0.2 mg/dL (ref 0.0–1.0)
pH: 5.5 (ref 5.0–8.0)

## 2014-07-04 LAB — URINE MICROSCOPIC-ADD ON

## 2014-07-04 NOTE — ED Notes (Signed)
Per pt while having sex with her boyfriend last week she began to have vaginal bleeding for less than five minutes.  Since that time pt has had a headache and LLQ pain.

## 2014-07-05 ENCOUNTER — Emergency Department (HOSPITAL_COMMUNITY): Payer: Medicaid Other

## 2014-07-05 LAB — CBC WITH DIFFERENTIAL/PLATELET
Basophils Absolute: 0.1 10*3/uL (ref 0.0–0.1)
Basophils Relative: 0 % (ref 0–1)
Eosinophils Absolute: 0.5 10*3/uL (ref 0.0–0.7)
Eosinophils Relative: 4 % (ref 0–5)
HCT: 42.4 % (ref 36.0–46.0)
Hemoglobin: 14.7 g/dL (ref 12.0–15.0)
Lymphocytes Relative: 37 % (ref 12–46)
Lymphs Abs: 4.9 10*3/uL — ABNORMAL HIGH (ref 0.7–4.0)
MCH: 26.8 pg (ref 26.0–34.0)
MCHC: 34.7 g/dL (ref 30.0–36.0)
MCV: 77.4 fL — ABNORMAL LOW (ref 78.0–100.0)
Monocytes Absolute: 0.9 10*3/uL (ref 0.1–1.0)
Monocytes Relative: 7 % (ref 3–12)
Neutro Abs: 6.7 10*3/uL (ref 1.7–7.7)
Neutrophils Relative %: 51 % (ref 43–77)
Platelets: 188 10*3/uL (ref 150–400)
RBC: 5.48 MIL/uL — ABNORMAL HIGH (ref 3.87–5.11)
RDW: 15.5 % (ref 11.5–15.5)
WBC: 13.1 10*3/uL — ABNORMAL HIGH (ref 4.0–10.5)

## 2014-07-05 LAB — COMPREHENSIVE METABOLIC PANEL
ALT: 15 U/L (ref 0–35)
AST: 13 U/L (ref 0–37)
Albumin: 3.1 g/dL — ABNORMAL LOW (ref 3.5–5.2)
Alkaline Phosphatase: 73 U/L (ref 39–117)
Anion gap: 13 (ref 5–15)
BUN: 9 mg/dL (ref 6–23)
CO2: 23 mEq/L (ref 19–32)
Calcium: 9 mg/dL (ref 8.4–10.5)
Chloride: 108 mEq/L (ref 96–112)
Creatinine, Ser: 0.52 mg/dL (ref 0.50–1.10)
GFR calc Af Amer: >90 mL/min (ref >90)
GFR calc non Af Amer: >90 mL/min (ref >90)
Glucose, Bld: 97 mg/dL (ref 70–99)
Potassium: 4.2 mEq/L (ref 3.7–5.3)
Sodium: 144 mEq/L (ref 137–147)
Total Bilirubin: <0.2 mg/dL — ABNORMAL LOW (ref 0.3–1.2)
Total Protein: 6.3 g/dL (ref 6.0–8.3)

## 2014-07-05 LAB — LIPASE, BLOOD: Lipase: 26 U/L (ref 11–59)

## 2014-07-05 MED ORDER — TRAMADOL HCL 50 MG PO TABS: 50.0000 mg | ORAL_TABLET | Freq: Four times a day (QID) | ORAL | Status: DC | PRN

## 2014-07-05 MED ORDER — DIPHENHYDRAMINE HCL 50 MG/ML IJ SOLN
25.0000 mg | Freq: Once | INTRAMUSCULAR | Status: AC
  Administered 2014-07-05: 25 mg via INTRAVENOUS
  Filled 2014-07-05: qty 1

## 2014-07-05 MED ORDER — ONDANSETRON 4 MG PO TBDP: 4.0000 mg | ORAL_TABLET | Freq: Three times a day (TID) | ORAL | Status: DC | PRN

## 2014-07-05 MED ORDER — SODIUM CHLORIDE 0.9 % IV BOLUS (SEPSIS)
1000.0000 mL | Freq: Once | INTRAVENOUS | Status: AC
  Administered 2014-07-05: 1000 mL via INTRAVENOUS

## 2014-07-05 MED ORDER — METOCLOPRAMIDE HCL 5 MG/ML IJ SOLN
10.0000 mg | Freq: Once | INTRAMUSCULAR | Status: AC
  Administered 2014-07-05: 10 mg via INTRAVENOUS
  Filled 2014-07-05: qty 2

## 2014-07-05 MED ORDER — KETOROLAC TROMETHAMINE 30 MG/ML IJ SOLN
30.0000 mg | Freq: Once | INTRAMUSCULAR | Status: AC
  Administered 2014-07-05: 30 mg via INTRAVENOUS
  Filled 2014-07-05: qty 1

## 2014-07-05 NOTE — ED Provider Notes (Signed)
Medical screening examination/treatment/procedure(s) were performed by non-physician practitioner and as supervising physician I was immediately available for consultation/collaboration.   EKG Interpretation None        Ahmod Gillespie M Jenness Stemler, MD 07/05/14 0343 

## 2014-07-05 NOTE — Discharge Instructions (Signed)
Take Tramadol as needed for pain. Take zofran as needed for nausea. Refer to attached documents for more information.  °

## 2014-07-05 NOTE — ED Provider Notes (Signed)
CSN: [id number]     Arrival date & time 07/04/14  2213 History   First MD Initiated Contact with Patient 07/05/14 0034     Chief Complaint  Patient presents with  . Abdominal Pain  . Headache     (Consider location/radiation/quality/duration/timing/severity/associated sxs/prior Treatment) HPI Comments: Patient is a 28 year old female with a past medical history of chronic PID and genital herpes who presents with abdominal pain for the past week. The pain is located in her upper abdomen and does not radiate. The pain is described as aching and severe. The pain started gradually and progressively worsened since the onset. No alleviating/aggravating factors. The patient has tried nothing for symptoms without relief. Associated symptoms include headache. Patient denies fever, NVD, chest pain, SOB, dysuria, constipation, abnormal vaginal bleeding/discharge. No history of abdominal surgery.      Past Medical History  Diagnosis Date  . Anxiety   . Depression   . Bipolar disorder     no current med.  . Irritable bowel syndrome (IBS)     no current med.  [name] Ulnar fracture 03/09/2014    left  . Scalp laceration     staples to be removed 03/13/2014  . History of MRSA infection 2010  . Apnea, sleep     no CPAP use, states lost machine during move to [place]   Past Surgical History  Procedure Laterality Date  . Ovarian cyst removal    . Tubal ligation    . Thyroid cyst excision      nodule exc.  [name] Cyst excision      middle of back  . Orif ulnar fracture Left 03/16/2014    Procedure: OPEN REDUCTION INTERNAL FIXATION (ORIF) LEFT ULNAR FRACTURE;  Surgeon: Marlowe Shores, MD;  Location: [facility];  Service: Orthopedics;  Laterality: Left;  left   Family History  Problem Relation Age of Onset  . Heart disease Mother   . Cancer Maternal Grandmother     breast  . Cancer Paternal Grandmother     breast   History  Substance Use Topics  . Smoking status: Current Some Day Smoker  -- 3 years    Types: Cigarettes  . Smokeless tobacco: Never Used     Comment: 1-2 cig./day  . Alcohol Use: No   OB History   Grav Para Term Preterm Abortions TAB SAB Ect Mult Living   Review of Systems  Constitutional: Negative for fever, chills and fatigue.  HENT: Negative for trouble swallowing.   Eyes: Negative for visual disturbance.  Respiratory: Negative for shortness of breath.   Cardiovascular: Negative for chest pain and palpitations.  Gastrointestinal: Positive for abdominal pain. Negative for nausea, vomiting and diarrhea.  Genitourinary: Negative for dysuria and difficulty urinating.  Musculoskeletal: Negative for arthralgias and neck pain.  Skin: Negative for color change.  Neurological: Positive for headaches. Negative for dizziness and weakness.  Psychiatric/Behavioral: Negative for dysphoric mood.      Allergies  Flagyl; Naproxen; and Sulfa antibiotics  Home Medications   Prior to Admission medications   Medication Sig Start Date End Date Taking? Authorizing Provider  azithromycin (ZITHROMAX) 250 MG tablet Take 4 tablets (1,000 mg total) by mouth once. 05/27/14   Brock Bad, MD  cefdinir (OMNICEF) 300 MG capsule Take 1 capsule (300 mg total) by mouth 2 (two) times daily. 07/03/14   Linna Hoff, MD  cefixime (SUPRAX) 400 MG  tablet Take 1 tablet (400 mg total) by mouth daily. 05/27/14   Brock Bad, MD  Clindamycin Phosphate, 1 Dose, vaginal cream Apply 1 applicator topically once. Insert applicator into the vagina for a one time dosing 06/01/14   Brock Bad, MD  Oxycodone HCl 10 MG TABS Take 1 tablet (10 mg total) by mouth every 6 (six) hours as needed. 05/27/14   Brock Bad, MD   BP 120/78  Pulse 70  Temp(Src) 98.3 F (36.8 C) (Oral)  Resp 20  Ht  (1.702 m)  Wt 171 lb (77.565 kg)  BMI 26.78 kg/m2  SpO2 98%  LMP 06/12/2014 Physical Exam  Nursing note and vitals reviewed. Constitutional: She is oriented to  person, place, and time. She appears well-developed and well-nourished. No distress.  HENT:  Head: Normocephalic and atraumatic.  Eyes: Conjunctivae and EOM are normal. No scleral icterus.  Neck: Normal range of motion.  Cardiovascular: Normal rate and regular rhythm.  Exam reveals no gallop and no friction rub.   No murmur heard. Pulmonary/Chest: Effort normal and breath sounds normal. She has no wheezes. She has no rales. She exhibits no tenderness.  Abdominal: Soft. She exhibits no distension. There is tenderness. There is no rebound.  RUQ and epigastric tenderness to palpation. No other focal tenderness or peritoneal signs.   Musculoskeletal: Normal range of motion.  Neurological: She is alert and oriented to person, place, and time. Coordination normal.  Speech is goal-oriented. Moves limbs without ataxia.   Skin: Skin is warm and dry.  Psychiatric: She has a normal mood and affect. Her behavior is normal.    ED Course  Procedures (including critical care time) Labs Review Labs Reviewed  CBC WITH DIFFERENTIAL - Abnormal; Notable for the following:    WBC 13.1 (*)    RBC 5.48 (*)    MCV 77.4 (*)    Lymphs Abs 4.9 (*)    All other components within normal limits  COMPREHENSIVE METABOLIC PANEL - Abnormal; Notable for the following:    Albumin 3.1 (*)    Total Bilirubin <0.2 (*)    All other components within normal limits  URINALYSIS, ROUTINE W REFLEX MICROSCOPIC - Abnormal; Notable for the following:    APPearance CLOUDY (*)    Hgb urine dipstick TRACE (*)    Leukocytes, UA SMALL (*)    All other components within normal limits  URINE MICROSCOPIC-ADD ON - Abnormal; Notable for the following:    Squamous Epithelial / LPF FEW (*)    Bacteria, UA FEW (*)    All other components within normal limits  PREGNANCY, URINE  LIPASE, BLOOD    Imaging Review US Abdomen Complete  07/05/2014   CLINICAL DATA:  Rule out cholecystitis.  Abdominal pain.  EXAM: ULTRASOUND ABDOMEN  COMPLETE  COMPARISON:  None.  FINDINGS: Gallbladder:  No gallstones or wall thickening visualized. No sonographic Murphy sign noted.  Common bile duct:  Diameter: 4 mm.  Where visualized, no filling defect.  Liver:  No focal lesion identified. Within normal limits in parenchymal echogenicity.  IVC:  No abnormality visualized.  Pancreas:  Visualized portion unremarkable.  Spleen:  Size and appearance within normal limits.  Right Kidney:  Length: 12 cm. Echogenicity within normal limits. No mass or hydronephrosis visualized.  Left Kidney:  Length: 12 cm. Echogenicity within normal limits. No mass or hydronephrosis visualized.  Abdominal aorta:  No aneurysm visualized.  Other findings:  None.  IMPRESSION: Negative abdominal ultrasound.   Electronically Signed  By: Tiburcio Pea M.D.   On: 07/05/2014 02:41     EKG Interpretation None      MDM   Final diagnoses:  RUQ abdominal pain  Other headache syndrome    1:02 AM Labs show elevated WBC at 13.1. Urinalysis unremarkable for acute changes. Urine pregnancy negative. Patient will have toradol, reglan, and benadryl for headache. CMP pending.   3:02 AM CMP and US unremarkable for acute changes. Patient reports improvement of her pain since being in the ED. Patient will be discharged with Tramadol and zofran for symptoms. Patient instructed to follow up with PCP for further evaluation. Patient instructed to return with worsening or concerning symptoms.    Emilia Beck, [place] 07/05/14 [phone]

## 2014-07-06 ENCOUNTER — Telehealth: Payer: Self-pay | Admitting: *Deleted

## 2014-07-06 NOTE — Telephone Encounter (Signed)
Patient called stating that she has not had a cycle. Patient states she has cramping like her cycle is going to come but no bleeding. Patient states she had some bleeding 5-6 days ago that went from dark pink to light pink in about 5 minutes and that was it. Patient states she is also having headaches. Patient states she was seen at the hospital and that they did all kinds of test but could not find anything wrong. Patient states she had a Tubal Ligation 5 years ago. Patient states she was notified to follow up with [place].   Please advise.

## 2014-07-07 NOTE — Telephone Encounter (Signed)
Patient notified and transferred to [facility] for an appointment.

## 2014-07-07 NOTE — Telephone Encounter (Signed)
Patient should make an appointment for evaluation.

## 2014-07-07 NOTE — Telephone Encounter (Signed)
Tried to contact patient to schedule an appointment. LM on VM to CB.

## 2014-07-16 ENCOUNTER — Encounter: Payer: Self-pay | Admitting: Obstetrics & Gynecology

## 2014-07-16 ENCOUNTER — Encounter: Payer: Self-pay | Admitting: Gastroenterology

## 2014-07-16 ENCOUNTER — Ambulatory Visit (INDEPENDENT_AMBULATORY_CARE_PROVIDER_SITE_OTHER): Payer: Medicaid Other | Admitting: Obstetrics & Gynecology

## 2014-07-16 DIAGNOSIS — Z113 Encounter for screening for infections with a predominantly sexual mode of transmission: Secondary | ICD-10-CM

## 2014-07-16 DIAGNOSIS — R102 Pelvic and perineal pain: Secondary | ICD-10-CM

## 2014-07-16 DIAGNOSIS — R109 Unspecified abdominal pain: Secondary | ICD-10-CM

## 2014-07-16 DIAGNOSIS — N949 Unspecified condition associated with female genital organs and menstrual cycle: Secondary | ICD-10-CM

## 2014-07-16 DIAGNOSIS — A499 Bacterial infection, unspecified: Secondary | ICD-10-CM

## 2014-07-16 DIAGNOSIS — Z3202 Encounter for pregnancy test, result negative: Secondary | ICD-10-CM

## 2014-07-16 DIAGNOSIS — B9689 Other specified bacterial agents as the cause of diseases classified elsewhere: Secondary | ICD-10-CM

## 2014-07-16 DIAGNOSIS — N760 Acute vaginitis: Secondary | ICD-10-CM

## 2014-07-16 LAB — POCT URINE PREGNANCY: Preg Test, Ur: NEGATIVE

## 2014-07-16 MED ORDER — METRONIDAZOLE 0.75 % VA GEL: 1.0000 | Freq: Two times a day (BID) | VAGINAL | Status: DC

## 2014-07-16 MED ORDER — CEFTRIAXONE SODIUM 1 G IJ SOLR
250.0000 mg | Freq: Once | INTRAMUSCULAR | Status: AC
  Administered 2014-07-16: 250 mg via INTRAMUSCULAR

## 2014-07-16 MED ORDER — DOXYCYCLINE HYCLATE 50 MG PO CAPS: 100.0000 mg | ORAL_CAPSULE | Freq: Two times a day (BID) | ORAL | Status: DC

## 2014-07-16 MED ORDER — MELOXICAM 7.5 MG PO TABS: 7.5000 mg | ORAL_TABLET | Freq: Every day | ORAL | Status: DC

## 2014-07-16 NOTE — Progress Notes (Signed)
Patient ID: Leah Barry, female   DOB: 05-29-1986, 28 y.o.   MRN: [id number]  Chief Complaint  Patient presents with  . Follow-up    from hospital visit on 8/23    HPI Leah Barry is a 28 y.o. female.  She presented to the ED 1 week ago with diffuse abdominal pain.  Work-up was negative.  She gives a h/o ovarian cysts. Abdominal Pain    Past Medical History  Diagnosis Date  . Anxiety   . Depression   . Bipolar disorder     no current med.  . Irritable bowel syndrome (IBS)     no current med.  [name] Ulnar fracture 03/09/2014    left  . Scalp laceration     staples to be removed 03/13/2014  . History of MRSA infection 2010  . Apnea, sleep     no CPAP use, states lost machine during move to [place]    Past Surgical History  Procedure Laterality Date  . Ovarian cyst removal    . Tubal ligation    . Thyroid cyst excision      nodule exc.  [name] Cyst excision      middle of back  . Orif ulnar fracture Left 03/16/2014    Procedure: OPEN REDUCTION INTERNAL FIXATION (ORIF) LEFT ULNAR FRACTURE;  Surgeon: Marlowe Shores, MD;  Location: [facility];  Service: Orthopedics;  Laterality: Left;  left    Family History  Problem Relation Age of Onset  . Heart disease Mother   . Cancer Maternal Grandmother     breast  . Cancer Paternal Grandmother     breast    Social History History  Substance Use Topics  . Smoking status: Current Some Day Smoker -- 3 years    Types: Cigarettes  . Smokeless tobacco: Never Used     Comment: 1-2 cig./day  . Alcohol Use: No    Allergies  Allergen Reactions  . Flagyl [Metronidazole] Nausea And Vomiting  . Naproxen Other (See Comments)    NOSEBLEED  . Sulfa Antibiotics Swelling    SWELLING OF EYES    Current Outpatient Prescriptions  Medication Sig Dispense Refill  . cefdinir (OMNICEF) 300 MG capsule Take 300 mg by mouth 2 (two) times daily. 7 day therapy course patient is on day 2      . doxycycline (VIBRAMYCIN) 50 MG  capsule Take 2 capsules (100 mg total) by mouth 2 (two) times daily. For 14 days  28 capsule  0  . meloxicam (MOBIC) 7.5 MG tablet Take 1 tablet (7.5 mg total) by mouth daily. Increase to 2 tablets daily as needed.  60 tablet  2  . metroNIDAZOLE (METROGEL VAGINAL) 0.75 % vaginal gel Place 1 Applicatorful vaginally 2 (two) times daily.  70 g  0  . ondansetron (ZOFRAN ODT) 4 MG disintegrating tablet Take 1 tablet (4 mg total) by mouth every 8 (eight) hours as needed for nausea or vomiting.  10 tablet  0  . traMADol (ULTRAM) 50 MG tablet Take 1 tablet (50 mg total) by mouth every 6 (six) hours as needed.  15 tablet  0   No current facility-administered medications for this visit.    Review of Systems Review of Systems  Gastrointestinal: Positive for abdominal pain.   Constitutional: negative for fatigue and weight loss Respiratory: negative for cough and wheezing Cardiovascular: negative for chest pain, fatigue and palpitations Gastrointestinal: negative for abdominal pain and change in bowel habits Genitourinary:negative for abnormal vaginal discharge  Integument/breast: negative for nipple discharge Musculoskeletal:negative for myalgias Neurological: negative for gait problems and tremors Behavioral/Psych: negative for abusive relationship, depression Endocrine: negative for temperature intolerance     Last menstrual period 06/12/2014.  Physical Exam Physical Exam General:   alert  Skin:   no rash or abnormalities  Lungs:   clear to auscultation bilaterally  Heart:   regular rate and rhythm, S1, S2 normal, no murmur, click, rub or gallop  Breasts:   normal without suspicious masses, skin or nipple changes or axillary nodes  Abdomen:  normal findings: no organomegaly, soft, no hernia; mild LLQ tenderness  Pelvis:  External genitalia: normal general appearance Urinary system: urethral meatus normal and bladder without fullness, nontender Vaginal: thin, white vaginal Cervix: normal  appearance; CMT positve Adnexa: bilateral, mild tenderness; no masses Uterus: anteverted and non-tender, normal size      Data Reviewed: UPT, labs, U/S  Assessment   ?PID BV H/O IBS    Plan Outpt treatment of PID    Orders Placed This Encounter  Procedures  . GC/Chlamydia Probe Amp  . US Pelvis Complete    Standing Status: Future     Number of Occurrences:      Standing Expiration Date: 09/16/2015    Order Specific Question:  Reason for Exam (SYMPTOM  OR DIAGNOSIS REQUIRED)    Answer:  pelvic pain in female    Order Specific Question:  Preferred imaging location?    Answer:  Internal  . US Transvaginal Non-OB    Standing Status: Future     Number of Occurrences:      Standing Expiration Date: 09/16/2015    Order Specific Question:  Reason for Exam (SYMPTOM  OR DIAGNOSIS REQUIRED)    Answer:  pelvic pain in female    Order Specific Question:  Preferred imaging location?    Answer:  Internal  . hCG, quantitative, pregnancy  . Ambulatory referral to Gastroenterology    Referral Priority:  Routine    Referral Type:  Consultation    Referral Reason:  Specialty Services Required    Requested Specialty:  Gastroenterology    Number of Visits Requested:  1  . POCT Wet Prep [employer])  . POCT urine pregnancy   Meds ordered this encounter  Medications  . metroNIDAZOLE (METROGEL VAGINAL) 0.75 % vaginal gel    Sig: Place 1 Applicatorful vaginally 2 (two) times daily.    Dispense:  70 g    Refill:  0  . doxycycline (VIBRAMYCIN) 50 MG capsule    Sig: Take 2 capsules (100 mg total) by mouth 2 (two) times daily. For 14 days    Dispense:  28 capsule    Refill:  0  . meloxicam (MOBIC) 7.5 MG tablet    Sig: Take 1 tablet (7.5 mg total) by mouth daily. Increase to 2 tablets daily as needed.    Dispense:  60 tablet    Refill:  2  . cefTRIAXone (ROCEPHIN) injection 250 mg    Sig:     Order Specific Question:  Antibiotic Indication:    Answer:  STD   Return in 2 weeks          JACKSON-MOORE,Tristy Udovich A 07/17/2014, 7:16 PM

## 2014-07-17 LAB — POCT WET PREP (WET MOUNT)
Clue Cells Wet Prep Whiff POC: POSITIVE
KOH Wet Prep POC: NEGATIVE

## 2014-07-17 LAB — HCG, QUANTITATIVE, PREGNANCY: hCG, Beta Chain, Quant, S: <2 m[IU]/mL

## 2014-07-17 LAB — GC/CHLAMYDIA PROBE AMP
CT Probe RNA: NEGATIVE
GC Probe RNA: NEGATIVE

## 2014-07-17 NOTE — Patient Instructions (Signed)
Pelvic Inflammatory Disease °Pelvic inflammatory disease (PID) refers to an infection in some or all of the female organs. The infection can be in the uterus, ovaries, fallopian tubes, or the surrounding tissues in the pelvis. PID can cause abdominal or pelvic pain that comes on suddenly (acute pelvic pain). PID is a serious infection because it can lead to lasting (chronic) pelvic pain or the inability to have children (infertile).  °CAUSES  °The infection is often caused by the normal bacteria found in the vaginal tissues. PID may also be caused by an infection that is spread during sexual contact. PID can also occur following:  °· The birth of a baby.   °· A miscarriage.   °· An abortion.   °· Major pelvic surgery.   °· The use of an intrauterine device (IUD).   °· A sexual assault.   °RISK FACTORS °Certain factors can put a person at higher risk for PID, such as: °· Being younger than 25 years. °· Being sexually active at a young age. °· Using nonbarrier contraception. °· Having multiple sexual partners. °· Having sex with someone who has symptoms of a genital infection. °· Using oral contraception. °Other times, certain behaviors can increase the possibility of getting PID, such as: °· Having sex during your period. °· Using a vaginal douche. °· Having an intrauterine device (IUD) in place. °SYMPTOMS  °· Abdominal or pelvic pain.   °· Fever.   °· Chills.   °· Abnormal vaginal discharge. °· Abnormal uterine bleeding.   °· Unusual pain shortly after finishing your period. °DIAGNOSIS  °Your caregiver will choose some of the following methods to make a diagnosis, such as:  °· Performing a physical exam and history. A pelvic exam typically reveals a very tender uterus and surrounding pelvis.   °· Ordering laboratory tests including a pregnancy test, blood tests, and urine test.  °· Ordering cultures of the vagina and cervix to check for a sexually transmitted infection (STI). °· Performing an ultrasound.    °· Performing a laparoscopic procedure to look inside the pelvis.   °TREATMENT  °· Antibiotic medicines may be prescribed and taken by mouth.   °· Sexual partners may be treated when the infection is caused by a sexually transmitted disease (STD).   °· Hospitalization may be needed to give antibiotics intravenously. °· Surgery may be needed, but this is rare. °It may take weeks until you are completely well. If you are diagnosed with PID, you should also be checked for human immunodeficiency virus (HIV).   °HOME CARE INSTRUCTIONS  °· If given, take your antibiotics as directed. Finish the medicine even if you start to feel better.   °· Only take over-the-counter or prescription medicines for pain, discomfort, or fever as directed by your caregiver.   °· Do not have sexual intercourse until treatment is completed or as directed by your caregiver. If PID is confirmed, your recent sexual partner(s) will need treatment.   °· Keep your follow-up appointments. °SEEK MEDICAL CARE IF:  °· You have increased or abnormal vaginal discharge.   °· You need prescription medicine for your pain.   °· You vomit.   °· You cannot take your medicines.   °· Your partner has an STD.   °SEEK IMMEDIATE MEDICAL CARE IF:  °· You have a fever.   °· You have increased abdominal or pelvic pain.   °· You have chills.   °· You have pain when you urinate.   °· You are not better after 72 hours following treatment.   °MAKE SURE YOU:  °· Understand these instructions. °· Will watch your condition. °· Will get help right away if you are not doing well or get worse. °  Document Released: 10/30/2005 Document Revised: 02/24/2013 Document Reviewed: 10/26/2011 °[employer]® Patient Information ©2015 [employer]. This information is not intended to replace advice given to you by your health care provider. Make sure you discuss any questions you have with your health care provider. ° °

## 2014-07-23 ENCOUNTER — Ambulatory Visit: Payer: Medicaid Other | Admitting: Obstetrics

## 2014-07-24 ENCOUNTER — Telehealth: Payer: Self-pay | Admitting: *Deleted

## 2014-07-24 NOTE — Telephone Encounter (Signed)
Pt call to office for lab results.  Pt made aware of results and advised to keep u/s appt.

## 2014-07-28 ENCOUNTER — Ambulatory Visit (INDEPENDENT_AMBULATORY_CARE_PROVIDER_SITE_OTHER): Payer: Medicaid Other

## 2014-07-28 DIAGNOSIS — N949 Unspecified condition associated with female genital organs and menstrual cycle: Secondary | ICD-10-CM

## 2014-07-28 DIAGNOSIS — R102 Pelvic and perineal pain: Secondary | ICD-10-CM

## 2014-07-29 ENCOUNTER — Other Ambulatory Visit: Payer: Self-pay | Admitting: Obstetrics & Gynecology

## 2014-07-29 ENCOUNTER — Ambulatory Visit (INDEPENDENT_AMBULATORY_CARE_PROVIDER_SITE_OTHER): Payer: Medicaid Other | Admitting: Obstetrics & Gynecology

## 2014-07-29 ENCOUNTER — Other Ambulatory Visit: Payer: Medicaid Other

## 2014-07-29 ENCOUNTER — Encounter: Payer: Self-pay | Admitting: Obstetrics & Gynecology

## 2014-07-29 VITALS — BP 125/92 | HR 73 | Temp 97.4°F | Ht 67.0 in | Wt 173.0 lb

## 2014-07-29 DIAGNOSIS — N946 Dysmenorrhea, unspecified: Secondary | ICD-10-CM

## 2014-07-29 DIAGNOSIS — Z7251 High risk heterosexual behavior: Secondary | ICD-10-CM

## 2014-07-29 MED ORDER — METRONIDAZOLE 0.75 % VA GEL: 1.0000 | Freq: Two times a day (BID) | VAGINAL | Status: DC

## 2014-07-29 NOTE — Progress Notes (Signed)
Subjective:    Leah Barry is a 28 y.o. female who presents for sexually transmitted disease check. Sexual history reviewed with the patient. STI Exposure: multiple sexual partners: 4 in past day. Previous history of STI: PID. Current symptoms: cramping. Contraception: tubal ligation Menstrual History: OB History   Grav Para Term Preterm Abortions TAB SAB Ect Mult Living   Patient's last menstrual period was 06/12/2014.    Past Medical History  Diagnosis Date  . Anxiety   . Depression   . Bipolar disorder     no current med.  . Irritable bowel syndrome (IBS)     no current med.  [name] Ulnar fracture 03/09/2014    left  . Scalp laceration     staples to be removed 03/13/2014  . History of MRSA infection 2010  . Apnea, sleep     no CPAP use, states lost machine during move to [place]    Past Surgical History  Procedure Laterality Date  . Ovarian cyst removal    . Tubal ligation    . Thyroid cyst excision      nodule exc.  [name] Cyst excision      middle of back  . Orif ulnar fracture Left 03/16/2014    Procedure: OPEN REDUCTION INTERNAL FIXATION (ORIF) LEFT ULNAR FRACTURE;  Surgeon: Marlowe Shores, MD;  Location: [facility];  Service: Orthopedics;  Laterality: Left;  left    Current Outpatient Prescriptions  Medication Sig Dispense Refill  . cefdinir (OMNICEF) 300 MG capsule Take 300 mg by mouth 2 (two) times daily. 7 day therapy course patient is on day 2      . meloxicam (MOBIC) 7.5 MG tablet Take 1 tablet (7.5 mg total) by mouth daily. Increase to 2 tablets daily as needed.  60 tablet  2   No current facility-administered medications for this visit.   Allergies  Allergen Reactions  . Flagyl [Metronidazole] Nausea And Vomiting  . Naproxen Other (See Comments)    NOSEBLEED  . Sulfa Antibiotics Swelling    SWELLING OF EYES    History  Substance Use Topics  . Smoking status: Current Every Day Smoker -- 3 years    Types: Cigarettes  .  Smokeless tobacco: Never Used     Comment: 1-2 cig./day  . Alcohol Use: No    Family History  Problem Relation Age of Onset  . Heart disease Mother   . Cancer Maternal Grandmother     breast  . Cancer Paternal Grandmother     breast      Review of Systems Constitutional: negative for fevers Gastrointestinal: negative for abdominal pain Genitourinary:negative for abnormal menstrual periods, genital lesions and positive for vaginal discharge and dysuria    Objective:    BP 125/92  Pulse 73  Temp(Src) 97.4 F (36.3 C)  Ht  (1.702 m)  Wt 173 lb (78.472 kg)  BMI 27.09 kg/m2  LMP 06/12/2014 General:   Alert, oriented x 4, no acute distress observed  Skin:   no rash or abnormalities  Lungs:   clear to auscultation bilaterally  Heart:   regular rate and rhythm, S1, S2 normal, no murmur, click, rub or gallop  Breasts:   normal without suspicious masses, skin or nipple changes or axillary nodes  Abdomen:  normal findings: no organomegaly, soft, non-tender and no hernia  Pelvis:  External genitalia: normal general appearance Urinary system: urethral  meatus normal and bladder without fullness, nontender Vaginal: malodorous vaginal discharge Cervix: normal appearance Adnexa: normal bimanual exam Uterus: anteverted and non-tender, normal size       Lab Review Urine pregnancy test Labs reviewed yes Radiologic studies reviewed no   Assessment:  H/O recent risky sexual behavior Recent treatment for  STI  Plan:   Orders Placed This Encounter  Procedures  . WET PREP BY MOLECULAR PROBE  . GC/Chlamydia Probe Amp   Patient educated and informed about safe sex practices and use of condoms and abstinence.Educated regarding the importance of safe sex and positive lifestyle changes.  Urine pregnancy test- NEGATIVE  Recommend RPR/HIV testing at 6 wks, 3 mths and 6 mths  Follow up in 2 weeks or prn Tx:  Metronidazaole (Metrogel Vaginal 0.75%    Refills on file for Meloxicam  (Mobic) for abdominal pain.

## 2014-07-29 NOTE — Patient Instructions (Signed)

## 2014-07-30 LAB — GC/CHLAMYDIA PROBE AMP
CT Probe RNA: NEGATIVE
GC Probe RNA: NEGATIVE

## 2014-07-30 LAB — WET PREP BY MOLECULAR PROBE
Candida species: NEGATIVE
Gardnerella vaginalis: POSITIVE — AB
Trichomonas vaginosis: NEGATIVE

## 2014-08-29 ENCOUNTER — Emergency Department (HOSPITAL_COMMUNITY)
Admission: EM | Admit: 2014-08-29 | Discharge: 2014-08-29 | Disposition: A | Discharge status: Home / Self Care | Payer: Medicaid Other | Attending: Emergency Medicine | Admitting: Emergency Medicine

## 2014-08-29 ENCOUNTER — Encounter (HOSPITAL_COMMUNITY): Payer: Self-pay | Admitting: Emergency Medicine

## 2014-08-29 ENCOUNTER — Emergency Department (HOSPITAL_COMMUNITY): Payer: Medicaid Other

## 2014-08-29 DIAGNOSIS — Z3202 Encounter for pregnancy test, result negative: Secondary | ICD-10-CM | POA: Insufficient documentation

## 2014-08-29 DIAGNOSIS — R319 Hematuria, unspecified: Secondary | ICD-10-CM | POA: Diagnosis not present

## 2014-08-29 DIAGNOSIS — Z8614 Personal history of Methicillin resistant Staphylococcus aureus infection: Secondary | ICD-10-CM | POA: Insufficient documentation

## 2014-08-29 DIAGNOSIS — Z8719 Personal history of other diseases of the digestive system: Secondary | ICD-10-CM | POA: Diagnosis not present

## 2014-08-29 DIAGNOSIS — F319 Bipolar disorder, unspecified: Secondary | ICD-10-CM | POA: Diagnosis not present

## 2014-08-29 DIAGNOSIS — Z87828 Personal history of other (healed) physical injury and trauma: Secondary | ICD-10-CM | POA: Diagnosis not present

## 2014-08-29 DIAGNOSIS — N858 Other specified noninflammatory disorders of uterus: Secondary | ICD-10-CM | POA: Insufficient documentation

## 2014-08-29 DIAGNOSIS — Z8781 Personal history of (healed) traumatic fracture: Secondary | ICD-10-CM | POA: Diagnosis not present

## 2014-08-29 DIAGNOSIS — R109 Unspecified abdominal pain: Secondary | ICD-10-CM

## 2014-08-29 DIAGNOSIS — R102 Pelvic and perineal pain: Secondary | ICD-10-CM

## 2014-08-29 DIAGNOSIS — F419 Anxiety disorder, unspecified: Secondary | ICD-10-CM | POA: Insufficient documentation

## 2014-08-29 DIAGNOSIS — Z720 Tobacco use: Secondary | ICD-10-CM | POA: Diagnosis not present

## 2014-08-29 DIAGNOSIS — Z792 Long term (current) use of antibiotics: Secondary | ICD-10-CM | POA: Diagnosis not present

## 2014-08-29 LAB — CBC WITH DIFFERENTIAL/PLATELET
Basophils Absolute: 0 10*3/uL (ref 0.0–0.1)
Basophils Relative: 1 % (ref 0–1)
Eosinophils Absolute: 0.3 10*3/uL (ref 0.0–0.7)
Eosinophils Relative: 4 % (ref 0–5)
HCT: 39.1 % (ref 36.0–46.0)
Hemoglobin: 13.8 g/dL (ref 12.0–15.0)
Lymphocytes Relative: 29 % (ref 12–46)
Lymphs Abs: 2.4 10*3/uL (ref 0.7–4.0)
MCH: 26.6 pg (ref 26.0–34.0)
MCHC: 35.3 g/dL (ref 30.0–36.0)
MCV: 75.5 fL — ABNORMAL LOW (ref 78.0–100.0)
Monocytes Absolute: 0.6 10*3/uL (ref 0.1–1.0)
Monocytes Relative: 8 % (ref 3–12)
Neutro Abs: 4.8 10*3/uL (ref 1.7–7.7)
Neutrophils Relative %: 59 % (ref 43–77)
Platelets: 202 10*3/uL (ref 150–400)
RBC: 5.18 MIL/uL — ABNORMAL HIGH (ref 3.87–5.11)
RDW: 13.9 % (ref 11.5–15.5)
WBC: 8.2 10*3/uL (ref 4.0–10.5)

## 2014-08-29 LAB — WET PREP, GENITAL
Clue Cells Wet Prep HPF POC: NONE SEEN
Trich, Wet Prep: NONE SEEN
Yeast Wet Prep HPF POC: NONE SEEN

## 2014-08-29 LAB — COMPREHENSIVE METABOLIC PANEL
ALT: 16 U/L (ref 0–35)
AST: 14 U/L (ref 0–37)
Albumin: 3.5 g/dL (ref 3.5–5.2)
Alkaline Phosphatase: 78 U/L (ref 39–117)
Anion gap: 12 (ref 5–15)
BUN: 9 mg/dL (ref 6–23)
CO2: 23 mEq/L (ref 19–32)
Calcium: 8.6 mg/dL (ref 8.4–10.5)
Chloride: 104 mEq/L (ref 96–112)
Creatinine, Ser: 0.53 mg/dL (ref 0.50–1.10)
GFR calc Af Amer: >90 mL/min (ref >90)
GFR calc non Af Amer: >90 mL/min (ref >90)
Glucose, Bld: 133 mg/dL — ABNORMAL HIGH (ref 70–99)
Potassium: 3.9 mEq/L (ref 3.7–5.3)
Sodium: 139 mEq/L (ref 137–147)
Total Bilirubin: 0.3 mg/dL (ref 0.3–1.2)
Total Protein: 6.9 g/dL (ref 6.0–8.3)

## 2014-08-29 LAB — URINE MICROSCOPIC-ADD ON

## 2014-08-29 LAB — LIPASE, BLOOD: Lipase: 20 U/L (ref 11–59)

## 2014-08-29 LAB — URINALYSIS, ROUTINE W REFLEX MICROSCOPIC
Bilirubin Urine: NEGATIVE
Glucose, UA: NEGATIVE mg/dL
Ketones, ur: NEGATIVE mg/dL
Leukocytes, UA: NEGATIVE
Nitrite: NEGATIVE
Protein, ur: 30 mg/dL — AB
Specific Gravity, Urine: 1.026 (ref 1.005–1.030)
Urobilinogen, UA: 0.2 mg/dL (ref 0.0–1.0)
pH: 7 (ref 5.0–8.0)

## 2014-08-29 LAB — PREGNANCY, URINE: Preg Test, Ur: NEGATIVE

## 2014-08-29 MED ORDER — OXYCODONE-ACETAMINOPHEN 5-325 MG PO TABS
2.0000 | ORAL_TABLET | Freq: Once | ORAL | Status: AC
  Administered 2014-08-29: 2 via ORAL
  Filled 2014-08-29: qty 2

## 2014-08-29 MED ORDER — HYDROMORPHONE HCL 1 MG/ML IJ SOLN
0.5000 mg | Freq: Once | INTRAMUSCULAR | Status: AC
  Administered 2014-08-29: 0.5 mg via INTRAVENOUS
  Filled 2014-08-29: qty 1

## 2014-08-29 MED ORDER — HYDROMORPHONE HCL 1 MG/ML IJ SOLN
1.0000 mg | Freq: Once | INTRAMUSCULAR | Status: AC
  Administered 2014-08-29: 1 mg via INTRAVENOUS
  Filled 2014-08-29: qty 1

## 2014-08-29 MED ORDER — IBUPROFEN 800 MG PO TABS: 800.0000 mg | ORAL_TABLET | Freq: Three times a day (TID) | ORAL | Status: DC

## 2014-08-29 MED ORDER — OXYCODONE-ACETAMINOPHEN 5-325 MG PO TABS: 1.0000 | ORAL_TABLET | Freq: Four times a day (QID) | ORAL | Status: DC | PRN

## 2014-08-29 MED ORDER — KETOROLAC TROMETHAMINE 30 MG/ML IJ SOLN
30.0000 mg | Freq: Once | INTRAMUSCULAR | Status: AC
  Administered 2014-08-29: 30 mg via INTRAVENOUS
  Filled 2014-08-29: qty 1

## 2014-08-29 MED ORDER — SODIUM CHLORIDE 0.9 % IV BOLUS (SEPSIS)
1000.0000 mL | Freq: Once | INTRAVENOUS | Status: AC
  Administered 2014-08-29: 1000 mL via INTRAVENOUS

## 2014-08-29 MED ORDER — ONDANSETRON HCL 4 MG/2ML IJ SOLN
4.0000 mg | Freq: Once | INTRAMUSCULAR | Status: AC
  Administered 2014-08-29: 4 mg via INTRAVENOUS
  Filled 2014-08-29: qty 2

## 2014-08-29 NOTE — ED Notes (Signed)
PT RETURNED FROM CT

## 2014-08-29 NOTE — ED Notes (Signed)
Pt aware need for urine and unable to give specimen at this time

## 2014-08-29 NOTE — Discharge Instructions (Signed)
Call for a follow up appointment with a Family or Primary Care Provider.  Call a urologist for further evaluation of your flank pain and blood in urine. Return if Symptoms worsen.   Take medication as prescribed.  Drink plenty of fluids.

## 2014-08-29 NOTE — ED Notes (Signed)
Pt remains in u/s.

## 2014-08-29 NOTE — ED Notes (Signed)
Pt requesting addl pain med 

## 2014-08-29 NOTE — ED Notes (Signed)
Pt sts she is having severe left side pain. Pt loud and cussing at triage. Pt had to be pulled out of car sts she cannot stand. Pt stood to get on stretcher.

## 2014-08-29 NOTE — ED Provider Notes (Signed)
CSN: [id number]     Arrival date & time 08/29/14  1105 History   First MD Initiated Contact with Patient 08/29/14 1116     Chief Complaint  Patient presents with  . Flank Pain     (Consider location/radiation/quality/duration/timing/severity/associated sxs/prior Treatment) HPI Comments: Patient is a 28 year old female past no history of ovarian cyst, depression, anxiety presenting to the emergency department with a chief complaint of abdominal discomfort since today. Patient reports suprapubic discomfort with radiation to left groin, intermittent.  Patient reports associated blood in toilet bowel and on toilet paper, and on panti linner. Patient reports nausea without emesis. Reports to normal bowel movements today. Unsure last menstrual period. Patient reports similar symptoms with previous ovarian cysts. Denies history of renal calculi. OB/gynecology: Dr. Alwyn Ren  Patient is a 28 y.o. female presenting with flank pain. The history is provided by the patient. No language interpreter was used.  Flank Pain Associated symptoms include abdominal pain and nausea. Pertinent negatives include no chills, fever or vomiting.    Past Medical History  Diagnosis Date  . Anxiety   . Depression   . Bipolar disorder     no current med.  . Irritable bowel syndrome (IBS)     no current med.  [name] Ulnar fracture 03/09/2014    left  . Scalp laceration     staples to be removed 03/13/2014  . History of MRSA infection 2010  . Apnea, sleep     no CPAP use, states lost machine during move to [place]   Past Surgical History  Procedure Laterality Date  . Ovarian cyst removal    . Tubal ligation    . Thyroid cyst excision      nodule exc.  [name] Cyst excision      middle of back  . Orif ulnar fracture Left 03/16/2014    Procedure: OPEN REDUCTION INTERNAL FIXATION (ORIF) LEFT ULNAR FRACTURE;  Surgeon: Marlowe Shores, MD;  Location: [facility];  Service: Orthopedics;  Laterality: Left;  left    Family History  Problem Relation Age of Onset  . Heart disease Mother   . Cancer Maternal Grandmother     breast  . Cancer Paternal Grandmother     breast   History  Substance Use Topics  . Smoking status: Current Every Day Smoker -- 3 years    Types: Cigarettes  . Smokeless tobacco: Never Used     Comment: 1-2 cig./day  . Alcohol Use: No   OB History   Grav Para Term Preterm Abortions TAB SAB Ect Mult Living   4 4 4       4      Review of Systems  Constitutional: Negative for fever and chills.  Gastrointestinal: Positive for nausea and abdominal pain. Negative for vomiting, diarrhea, constipation and abdominal distention.  Genitourinary: Positive for hematuria and flank pain. Negative for urgency and vaginal discharge.      Allergies  Flagyl; Naproxen; and Sulfa antibiotics  Home Medications   Prior to Admission medications   Medication Sig Start Date End Date Taking? Authorizing Provider  cefdinir (OMNICEF) 300 MG capsule Take 300 mg by mouth 2 (two) times daily. 7 day therapy course patient is on day 2    Historical Provider, MD  meloxicam (MOBIC) 7.5 MG tablet Take 1 tablet (7.5 mg total) by mouth daily. Increase to 2 tablets daily as needed. 07/16/14   Antionette Char, MD  metroNIDAZOLE (METROGEL VAGINAL) 0.75 % vaginal gel Place 1 Applicatorful vaginally 2 (two)  times daily. 07/29/14   Antionette CharLisa Jackson-Moore, MD   BP 137/91  Pulse 71  Temp(Src) 98.1 F (36.7 C) (Oral)  Resp 18  SpO2 100% Physical Exam  Nursing note and vitals reviewed. Constitutional: She appears well-developed and well-nourished. No distress.  HENT:  Head: Normocephalic and atraumatic.  Cardiovascular: Regular rhythm.   Pulmonary/Chest: Effort normal and breath sounds normal. She has no wheezes. She has no rales.  Abdominal: Soft. Normal appearance and bowel sounds are normal. There is tenderness in the epigastric area, suprapubic area and left lower quadrant. There is no rebound, no  guarding and no CVA tenderness.  Moderate left lower quadrant tenderness with voluntary guarding. Mild epigastric tenderness with palpation.  Genitourinary: Uterus is not tender. Cervix exhibits no motion tenderness and no discharge. Right adnexum displays no tenderness. Left adnexum displays tenderness. Left adnexum displays no mass.  Moderate amount of dark blood and posterior vaginal vault. Moderate Left adnexal tenderness. Chaperone present.   Skin: Skin is warm and dry. She is not diaphoretic.  Psychiatric: She has a normal mood and affect. Her behavior is normal.    ED Course  Procedures (including critical care time) Labs Review Labs Reviewed  WET PREP, GENITAL - Abnormal; Notable for the following:    WBC, Wet Prep HPF POC FEW (*)    All other components within normal limits  URINALYSIS, ROUTINE W REFLEX MICROSCOPIC - Abnormal; Notable for the following:    APPearance HAZY (*)    Hgb urine dipstick LARGE (*)    Protein, ur 30 (*)    All other components within normal limits  CBC WITH DIFFERENTIAL - Abnormal; Notable for the following:    RBC 5.18 (*)    MCV 75.5 (*)    All other components within normal limits  COMPREHENSIVE METABOLIC PANEL - Abnormal; Notable for the following:    Glucose, Bld 133 (*)    All other components within normal limits  URINE MICROSCOPIC-ADD ON - Abnormal; Notable for the following:    Squamous Epithelial / LPF FEW (*)    Bacteria, UA FEW (*)    All other components within normal limits  GC/CHLAMYDIA PROBE AMP  PREGNANCY, URINE  LIPASE, BLOOD    Imaging Review [name] Transvaginal Non-ob  08/29/2014   CLINICAL DATA:  Left adnexal tenderness, bilateral tubal ligation  EXAM: TRANSABDOMINAL AND TRANSVAGINAL ULTRASOUND OF PELVIS  DOPPLER ULTRASOUND OF OVARIES  TECHNIQUE: Both transabdominal and transvaginal ultrasound examinations of the pelvis were performed. Transabdominal technique was performed for global imaging of the pelvis including uterus,  ovaries, adnexal regions, and pelvic cul-de-sac.  It was necessary to proceed with endovaginal exam following the transabdominal exam to visualize the ovaries and endometrium. Color and duplex Doppler ultrasound was utilized to evaluate blood flow to the ovaries.  COMPARISON:  None.  FINDINGS: Uterus  Measurements: 9.6 x 5.7 x 6.7 cm. No fibroids or other mass visualized.  Endometrium  Thickness: 11.7 mm.  No focal abnormality visualized.  Right ovary  Measurements: 3 x 1.8 x 2.1 cm. Normal appearance/no adnexal mass.  Left ovary  Measurements: 3.8 x 2.2 x 2.9 cm. Normal appearance/no adnexal mass.  Pulsed Doppler evaluation of both ovaries demonstrates normal low-resistance arterial and venous waveforms.  Other findings  No free fluid.  There is echogenic debris within the bladder.  IMPRESSION: 1. Normal pelvic ultrasound. 2. No ovarian torsion. 3. Echogenic debris within the bladder. The appearance is concerning for blood products from uncertain etiology.   Electronically Signed   By:  Elige Ko   On: 08/29/2014 16:32   US Pelvis Complete  08/29/2014   CLINICAL DATA:  Left adnexal tenderness, bilateral tubal ligation  EXAM: TRANSABDOMINAL AND TRANSVAGINAL ULTRASOUND OF PELVIS  DOPPLER ULTRASOUND OF OVARIES  TECHNIQUE: Both transabdominal and transvaginal ultrasound examinations of the pelvis were performed. Transabdominal technique was performed for global imaging of the pelvis including uterus, ovaries, adnexal regions, and pelvic cul-de-sac.  It was necessary to proceed with endovaginal exam following the transabdominal exam to visualize the ovaries and endometrium. Color and duplex Doppler ultrasound was utilized to evaluate blood flow to the ovaries.  COMPARISON:  None.  FINDINGS: Uterus  Measurements: 9.6 x 5.7 x 6.7 cm. No fibroids or other mass visualized.  Endometrium  Thickness: 11.7 mm.  No focal abnormality visualized.  Right ovary  Measurements: 3 x 1.8 x 2.1 cm. Normal appearance/no adnexal  mass.  Left ovary  Measurements: 3.8 x 2.2 x 2.9 cm. Normal appearance/no adnexal mass.  Pulsed Doppler evaluation of both ovaries demonstrates normal low-resistance arterial and venous waveforms.  Other findings  No free fluid.  There is echogenic debris within the bladder.  IMPRESSION: 1. Normal pelvic ultrasound. 2. No ovarian torsion. 3. Echogenic debris within the bladder. The appearance is concerning for blood products from uncertain etiology.   Electronically Signed   By: Elige Ko   On: 08/29/2014 16:32   [place] Art/ven Flow Abd Pelv Doppler  08/29/2014   CLINICAL DATA:  Left adnexal tenderness, bilateral tubal ligation  EXAM: TRANSABDOMINAL AND TRANSVAGINAL ULTRASOUND OF PELVIS  DOPPLER ULTRASOUND OF OVARIES  TECHNIQUE: Both transabdominal and transvaginal ultrasound examinations of the pelvis were performed. Transabdominal technique was performed for global imaging of the pelvis including uterus, ovaries, adnexal regions, and pelvic cul-de-sac.  It was necessary to proceed with endovaginal exam following the transabdominal exam to visualize the ovaries and endometrium. Color and duplex Doppler ultrasound was utilized to evaluate blood flow to the ovaries.  COMPARISON:  None.  FINDINGS: Uterus  Measurements: 9.6 x 5.7 x 6.7 cm. No fibroids or other mass visualized.  Endometrium  Thickness: 11.7 mm.  No focal abnormality visualized.  Right ovary  Measurements: 3 x 1.8 x 2.1 cm. Normal appearance/no adnexal mass.  Left ovary  Measurements: 3.8 x 2.2 x 2.9 cm. Normal appearance/no adnexal mass.  Pulsed Doppler evaluation of both ovaries demonstrates normal low-resistance arterial and venous waveforms.  Other findings  No free fluid.  There is echogenic debris within the bladder.  IMPRESSION: 1. Normal pelvic ultrasound. 2. No ovarian torsion. 3. Echogenic debris within the bladder. The appearance is concerning for blood products from uncertain etiology.   Electronically Signed   By: Elige Ko   On:  08/29/2014 16:32     EKG Interpretation None      MDM   Final diagnoses:  Left adnexal tenderness  Flank pain  Hematuria   Patient presents with left lower quadrant discomfort for one day and hematuria. Questionable ovarian cyst versus renal calculi vs menstrual cramping.  EMR shows small nonobstructive renal calculi from 01/2014. Reevaluation patient reports moderate resolution of symptoms.  Re-evaluation Pt reports mild discomfort. Pelvic shows moderate blood and posterior vaginal vault. UA shows large hemoglobin, questionable contaminant from menses. Discussed need for [place] to rule out ovarian etiology of pain. [place] without signs of torsion, debris or blood in bladder. Plan to treat for likely kidney stone followup with urology. Patient has sulfa allergy will not prescribe Flomax. Plan pain control, strainer given. Discussed lab  results, imaging results, and treatment plan with the patient. Return precautions given. Reports understanding and no other concerns at this time.  Patient is stable for discharge at this time.  Meds given in ED:  Medications  ondansetron (ZOFRAN) injection 4 mg (4 mg Intravenous Given 08/29/14 1213)  HYDROmorphone (DILAUDID) injection 0.5 mg (0.5 mg Intravenous Given 08/29/14 1213)  HYDROmorphone (DILAUDID) injection 1 mg (1 mg Intravenous Given 08/29/14 1309)  ketorolac (TORADOL) 30 MG/ML injection 30 mg (30 mg Intravenous Given 08/29/14 1310)  sodium chloride 0.9 % bolus 1,000 mL (0 mLs Intravenous Stopped 08/29/14 1513)  oxyCODONE-acetaminophen (PERCOCET/ROXICET) 5-325 MG per tablet 2 tablet (2 tablets Oral Given 08/29/14 1659)    Discharge Medication List as of 08/29/2014  4:58 PM    START taking these medications   Details  !! ibuprofen (ADVIL,MOTRIN) 800 MG tablet Take 1 tablet (800 mg total) by mouth 3 (three) times daily., Starting 08/29/2014, Until Discontinued, Print    oxyCODONE-acetaminophen (PERCOCET) 5-325 MG per tablet Take 1-2 tablets by  mouth every 6 (six) hours as needed., Starting 08/29/2014, Until Discontinued, Print     !! - Potential duplicate medications found. Please discuss with provider.      Mellody DrownLauren Xaiden Fleig, PA-C 08/30/14 [phone]

## 2014-08-31 LAB — GC/CHLAMYDIA PROBE AMP
CT Probe RNA: NEGATIVE
GC Probe RNA: NEGATIVE

## 2014-09-01 ENCOUNTER — Telehealth: Payer: Self-pay | Admitting: *Deleted

## 2014-09-01 NOTE — Telephone Encounter (Signed)
Patient states she was seen in the emergency room due to pelvic pain and was informed that there was "blood filling her bladder: and she was referred to the urologist. Patient states she went to see the urologist and he told her there was nothing wrong with her bladder but that "there was something in her tube and she needed to see an obgyn." Patient advised upon review of her pelvic ultrasound , Dr.Harper states the ultrasound results are normal and that there is no need for follow up with him. Patient verbalizes understanding.

## 2014-09-03 ENCOUNTER — Ambulatory Visit: Payer: Medicaid Other | Admitting: Obstetrics

## 2014-09-07 NOTE — ED Provider Notes (Signed)
Medical screening examination/treatment/procedure(s) were performed by non-physician practitioner and as supervising physician I was immediately available for consultation/collaboration.   EKG Interpretation None        Layla MawKristen N Ward, DO 09/07/14 1351

## 2014-09-08 ENCOUNTER — Encounter (HOSPITAL_COMMUNITY): Payer: Self-pay | Admitting: *Deleted

## 2014-09-08 ENCOUNTER — Encounter (HOSPITAL_COMMUNITY): Payer: Self-pay | Admitting: Emergency Medicine

## 2014-09-08 ENCOUNTER — Inpatient Hospital Stay (EMERGENCY_DEPARTMENT_HOSPITAL)
Admission: AD | Admit: 2014-09-08 | Discharge: 2014-09-08 | Disposition: A | Discharge status: Home / Self Care | Payer: Medicaid Other | Source: Ambulatory Visit | Attending: Family Medicine | Admitting: Family Medicine

## 2014-09-08 ENCOUNTER — Emergency Department (HOSPITAL_COMMUNITY)
Admission: AD | Admit: 2014-09-08 | Discharge: 2014-09-08 | Disposition: A | Discharge status: Home / Self Care | Payer: Medicaid Other | Source: Ambulatory Visit | Attending: Family Medicine | Admitting: Family Medicine

## 2014-09-08 DIAGNOSIS — Z720 Tobacco use: Secondary | ICD-10-CM | POA: Insufficient documentation

## 2014-09-08 DIAGNOSIS — Z3202 Encounter for pregnancy test, result negative: Secondary | ICD-10-CM | POA: Insufficient documentation

## 2014-09-08 DIAGNOSIS — M79605 Pain in left leg: Secondary | ICD-10-CM | POA: Insufficient documentation

## 2014-09-08 DIAGNOSIS — N3001 Acute cystitis with hematuria: Secondary | ICD-10-CM

## 2014-09-08 DIAGNOSIS — R1032 Left lower quadrant pain: Secondary | ICD-10-CM | POA: Insufficient documentation

## 2014-09-08 LAB — COMPREHENSIVE METABOLIC PANEL
ALT: 16 U/L (ref 0–35)
AST: 12 U/L (ref 0–37)
Albumin: 4 g/dL (ref 3.5–5.2)
Alkaline Phosphatase: 91 U/L (ref 39–117)
Anion gap: 13 (ref 5–15)
BUN: 7 mg/dL (ref 6–23)
CO2: 27 mEq/L (ref 19–32)
Calcium: 9.2 mg/dL (ref 8.4–10.5)
Chloride: 100 mEq/L (ref 96–112)
Creatinine, Ser: 0.64 mg/dL (ref 0.50–1.10)
GFR calc Af Amer: >90 mL/min (ref >90)
GFR calc non Af Amer: >90 mL/min (ref >90)
Glucose, Bld: 88 mg/dL (ref 70–99)
Potassium: 3.3 mEq/L — ABNORMAL LOW (ref 3.7–5.3)
Sodium: 140 mEq/L (ref 137–147)
Total Bilirubin: 0.6 mg/dL (ref 0.3–1.2)
Total Protein: 7.6 g/dL (ref 6.0–8.3)

## 2014-09-08 LAB — URINALYSIS, ROUTINE W REFLEX MICROSCOPIC
Glucose, UA: NEGATIVE mg/dL
Hgb urine dipstick: NEGATIVE
Ketones, ur: 15 mg/dL — AB
Leukocytes, UA: NEGATIVE
Nitrite: NEGATIVE
Protein, ur: 100 mg/dL — AB
Specific Gravity, Urine: 1.024 (ref 1.005–1.030)
Urobilinogen, UA: 0.2 mg/dL (ref 0.0–1.0)
pH: 5.5 (ref 5.0–8.0)

## 2014-09-08 LAB — CBC WITH DIFFERENTIAL/PLATELET
Basophils Absolute: 0 10*3/uL (ref 0.0–0.1)
Basophils Relative: 0 % (ref 0–1)
Eosinophils Absolute: 0.2 10*3/uL (ref 0.0–0.7)
Eosinophils Relative: 2 % (ref 0–5)
HCT: 42.3 % (ref 36.0–46.0)
Hemoglobin: 14.6 g/dL (ref 12.0–15.0)
Lymphocytes Relative: 29 % (ref 12–46)
Lymphs Abs: 3.3 10*3/uL (ref 0.7–4.0)
MCH: 26.5 pg (ref 26.0–34.0)
MCHC: 34.5 g/dL (ref 30.0–36.0)
MCV: 76.8 fL — ABNORMAL LOW (ref 78.0–100.0)
Monocytes Absolute: 1 10*3/uL (ref 0.1–1.0)
Monocytes Relative: 9 % (ref 3–12)
Neutro Abs: 7 10*3/uL (ref 1.7–7.7)
Neutrophils Relative %: 60 % (ref 43–77)
Platelets: 327 10*3/uL (ref 150–400)
RBC: 5.51 MIL/uL — ABNORMAL HIGH (ref 3.87–5.11)
RDW: 14.2 % (ref 11.5–15.5)
WBC: 11.5 10*3/uL — ABNORMAL HIGH (ref 4.0–10.5)

## 2014-09-08 LAB — URINE MICROSCOPIC-ADD ON

## 2014-09-08 LAB — PREGNANCY, URINE: Preg Test, Ur: NEGATIVE

## 2014-09-08 LAB — POCT PREGNANCY, URINE: Preg Test, Ur: NEGATIVE

## 2014-09-08 LAB — LIPASE, BLOOD: Lipase: 12 U/L (ref 11–59)

## 2014-09-08 MED ORDER — OXYCODONE-ACETAMINOPHEN 5-325 MG PO TABS: 1.0000 | ORAL_TABLET | ORAL | Status: DC | PRN

## 2014-09-08 MED ORDER — CIPROFLOXACIN HCL 250 MG PO TABS: 250.0000 mg | ORAL_TABLET | Freq: Two times a day (BID) | ORAL | Status: DC

## 2014-09-08 MED ORDER — HYDROMORPHONE HCL 1 MG/ML IJ SOLN
1.0000 mg | Freq: Once | INTRAMUSCULAR | Status: AC
  Administered 2014-09-08: 1 mg via INTRAMUSCULAR
  Filled 2014-09-08: qty 1

## 2014-09-08 NOTE — ED Notes (Signed)
Pt reports being seen here two weeks ago for abd pain and was told that her bladder was full of blood and she was referred to a urologist but unable to go to appt. Pt reports pain had gone away but then onset of LLQ/side pain that radiates down left leg and pt having n/v since last night, also having urinary frequency and urgency.

## 2014-09-08 NOTE — ED Notes (Signed)
Pt. LWBS after triage. [facility] called and confirmed patient was at their facility. Will discharge patient as LWBS after triage.

## 2014-09-08 NOTE — MAU Note (Signed)
Pt. States [facility] and she states that they did an ultrasound at that time that revealed blood in her bladder. They referred her to a urologist and she was unable to go to this doctor because they did not accept her insurance. Pt. States she started throwing up yesterday and has increased pain in her side. Denies visible bleeding. Denies pain with urination. And has frequency and increased fatigue.

## 2014-09-08 NOTE — MAU Provider Note (Signed)
History     CSN: [id number]  Arrival date and time: 09/08/14 11911959   First Provider Initiated Contact with Patient 09/08/14 2101      No chief complaint on file.  HPI [name] is a 28 y.o. [phone] who presents to MAU today with complaint of pelvic and left sided pain. The patient also endorses N/V since last night. The pain started on 08/29/14. [facility]. Cultures were negative for infection. US showed debris in the bladder possibly blood. Patient was given Ibuprofen and told to contact Urology for an appointment. The contact information she was given was for a Urologist that does not accept Medicaid. Patient return today stating pain is worse since last night. She states subjective fever, but does not have a thermometer. She complains of pain in the LLQ and left lower back. She denies hematuria or dysuria, but does endorse urinary frequency and urgency.  OB History   Grav Para Term Preterm Abortions TAB SAB Ect Mult Living   4 4 4       4       Past Medical History  Diagnosis Date  . Anxiety   . Depression   . Bipolar disorder     no current med.  . Irritable bowel syndrome (IBS)     no current med.  [name]. Ulnar fracture 03/09/2014    left  . Scalp laceration     staples to be removed 03/13/2014  . History of MRSA infection 2010  . Apnea, sleep     no CPAP use, states lost machine during move to [place]    Past Surgical History  Procedure Laterality Date  . Ovarian cyst removal    . Tubal ligation    . Thyroid cyst excision      nodule exc.  [name]. Cyst excision      middle of back  . Orif ulnar fracture Left 03/16/2014    Procedure: OPEN REDUCTION INTERNAL FIXATION (ORIF) LEFT ULNAR FRACTURE;  Surgeon: Marlowe ShoresMatthew A Weingold, MD;  Location: [facility];  Service: Orthopedics;  Laterality: Left;  left    Family History  Problem Relation Age of Onset  . Heart disease Mother   . Cancer Maternal Grandmother     breast  . Cancer  Paternal Grandmother     breast    History  Substance Use Topics  . Smoking status: Current Every Day Smoker -- 3 years    Types: Cigarettes  . Smokeless tobacco: Never Used     Comment: 1-2 cig./day  . Alcohol Use: No    Allergies:  Allergies  Allergen Reactions  . Sulfa Antibiotics Swelling    SWELLING OF EYES  . Toradol [Ketorolac Tromethamine] Itching  . Flagyl [Metronidazole] Nausea And Vomiting  . Naproxen Other (See Comments)    NOSE BLEED  . Tramadol     Nausea     No prescriptions prior to admission    Review of Systems  Constitutional: Positive for fever. Negative for malaise/fatigue.  Gastrointestinal: Positive for nausea, vomiting and abdominal pain. Negative for diarrhea and constipation.  Genitourinary: Positive for urgency and frequency. Negative for dysuria, hematuria and flank pain.       Neg - vaginal bleeding   Physical Exam   Blood pressure 140/90, pulse 86, temperature 98.4 F (36.9 C), temperature source Oral, resp. rate 16, height 5\' 5"  (1.651 m), weight 176 lb 9.6 oz (80.105 kg), last menstrual period 08/10/2014, SpO2 99.00%.  Physical Exam  Constitutional: She is oriented to person, place, and time. She appears well-developed and well-nourished. No distress.  HENT:  Head: Normocephalic.  Cardiovascular: Normal rate.   Respiratory: Effort normal.  GI: Soft. She exhibits no distension and no mass. There is tenderness (mild to moderate suprapubic and LLQ). There is no rebound, no guarding and no CVA tenderness.  Neurological: She is alert and oriented to person, place, and time.  Skin: Skin is warm and dry. No erythema.  Psychiatric: She has a normal mood and affect.   Results for orders placed during the hospital encounter of 09/08/14 (from the past 24 hour(s))  POCT PREGNANCY, URINE     Status: None   Collection Time    09/08/14  8:45 PM      Result Value Ref Range   Preg Test, Ur NEGATIVE  NEGATIVE   Results for ISRA, [name]  (MRN [id number]) as of 09/08/2014 22:58  Ref. Range 09/08/2014 17:53  Sodium Latest Range: 137-147 mEq/L 140  Potassium Latest Range: 3.7-5.3 mEq/L 3.3 (L)  Chloride Latest Range: 96-112 mEq/L 100  CO2 Latest Range: 19-32 mEq/L 27  BUN Latest Range: 6-23 mg/dL 7  Creatinine Latest Range: 0.50-1.10 mg/dL 4.09  Calcium Latest Range: 8.4-10.5 mg/dL 9.2  GFR calc non Af Amer Latest Range: >90 mL/min >90  GFR calc Af Amer Latest Range: >90 mL/min >90  Glucose Latest Range: 70-99 mg/dL 88  Anion gap Latest Range: 5-15  13  Alkaline Phosphatase Latest Range: 39-117 U/L 91  Albumin Latest Range: 3.5-5.2 g/dL 4.0  Lipase Latest Range: 11-59 U/L 12  AST Latest Range: 0-37 U/L 12  ALT Latest Range: 0-35 U/L 16  Total Protein Latest Range: 6.0-8.3 g/dL 7.6  Total Bilirubin Latest Range: 0.3-1.2 mg/dL 0.6  WBC Latest Range: 4.0-10.5 K/uL 11.5 (H)  RBC Latest Range: 3.87-5.11 MIL/uL 5.51 (H)  Hemoglobin Latest Range: 12.0-15.0 g/dL 81.1  HCT Latest Range: 36.0-46.0 % 42.3  MCV Latest Range: 78.0-100.0 fL 76.8 (L)  MCH Latest Range: 26.0-34.0 pg 26.5  MCHC Latest Range: 30.0-36.0 g/dL 91.4  RDW Latest Range: 11.5-15.5 % 14.2  Platelets Latest Range: 150-400 K/uL 327  Neutrophils Relative % Latest Range: 43-77 % 60  Lymphocytes Relative Latest Range: 12-46 % 29  Monocytes Relative Latest Range: 3-12 % 9  Eosinophils Relative Latest Range: 0-5 % 2  Basophils Relative Latest Range: 0-1 % 0  NEUT# Latest Range: 1.7-7.7 K/uL 7.0  Lymphocytes Absolute Latest Range: 0.7-4.0 K/uL 3.3  Monocytes Absolute Latest Range: 0.1-1.0 K/uL 1.0  Eosinophils Absolute Latest Range: 0.0-0.7 K/uL 0.2  Basophils Absolute Latest Range: 0.0-0.1 K/uL 0.0  Results for ELVIRA, LANGSTON (MRN [id number]) as of 09/08/2014 22:58  Ref. Range 09/08/2014 17:59  Color, Urine Latest Range: YELLOW  AMBER (A)  APPearance Latest Range: CLEAR  CLOUDY (A)  Specific Gravity, Urine Latest Range: 1.005-1.030  1.024  pH Latest Range:  5.0-8.0  5.5  Glucose Latest Range: NEGATIVE mg/dL NEGATIVE  Bilirubin Urine Latest Range: NEGATIVE  SMALL (A)  Ketones, ur Latest Range: NEGATIVE mg/dL 15 (A)  Protein Latest Range: NEGATIVE mg/dL 086 (A)  Urobilinogen, UA Latest Range: 0.0-1.0 mg/dL 0.2  Nitrite Latest Range: NEGATIVE  NEGATIVE  Leukocytes, UA Latest Range: NEGATIVE  NEGATIVE  Hgb urine dipstick Latest Range: NEGATIVE  NEGATIVE  Urine-Other No range found MUCOUS PRESENT  WBC, UA Latest Range: <3 WBC/hpf 0-2  RBC / HPF Latest Range: <3 RBC/hpf 0-2  Squamous Epithelial / LPF Latest Range: RARE  MANY (A)  Bacteria,  UA Latest Range: RARE  RARE   MAU Course  Procedures None  MDM UPT - negative UA, CBC, CMP and Lipase [facility] tonight 1 mg Dilaudid given in MAU. Patient reports improvement in symptoms.  No episodes of N/V or fever in MAU today  Assessment and Plan  A: UTI Elevated BP  P: Discharge home Rx for Cipro and Percocet given to patient Patient advised to warning signs for pyelonephritis Patient will be referred to Urology. Message sent to WOC to make an appointment and contact patient Warning signs for HTN and CVA discussed Patient advised to establish care with PCP ASAP for management and [facility] if warning signs arise Patient may return to MAU as needed or if her condition were to change or worsen   Marny LowensteinJulie N Wenzel, PA-C  09/08/2014, 10:58 PM

## 2014-09-08 NOTE — Discharge Instructions (Signed)

## 2014-09-08 NOTE — MAU Note (Signed)
[facility] and was told her bladder was full of blood. Having left side pain that radiates down into leg. Has taken 800mg  of ibuprofen and not having any relief. Having increase in frequency with urination but denies pain or burning with urination. Denies fever or chills but has had nausea and vomiting. States she has thrown up once today. Feeling exhausted.

## 2014-09-10 ENCOUNTER — Telehealth: Payer: Self-pay

## 2014-09-10 NOTE — MAU Provider Note (Signed)
Attestation of Attending Supervision of Advanced Practitioner (PA/CNM/NP): Evaluation and management procedures were performed by the Advanced Practitioner under my supervision and collaboration.  I have seen and examined this patient.  I have reviewed the Advanced Practitioner's note and chart, and I agree with the management and plan.  Jacob Stinson, DO Attending Physician Faculty Practice, [facility]  

## 2014-09-10 NOTE — Telephone Encounter (Signed)
Message copied by Louanna RawAMPBELL, Divonte Senger M on Thu Sep 10, 2014  8:21 AM ------      Message from: Marny LowensteinWENZEL, JULIE N      Created: Tue Sep 08, 2014 10:02 PM       Please get patient referral to urology. Patient was given contact info for [place] in [place] that doesn't except Medicaid. She doesn't remember who that was. Can you find her someone and let her know when the appointment is? Patient had debris, most likely blood in her bladder on US last week.             Thanks,             Raynelle FanningJulie ------

## 2014-09-10 NOTE — Telephone Encounter (Signed)
Called patient and asked what PCP was listed on her card. Patient states the PCP listed on her card no longer accepts medicaid. Explained to patient that she will need to call Medicaid case worker and have PCP on card changed and then she will need to be referred to urology by that specific MD in order for care to be covered by Medicaid. Patient verbalized understanding and stated she if she has any questions she will call clinic.

## 2014-09-14 ENCOUNTER — Encounter (HOSPITAL_COMMUNITY): Payer: Self-pay | Admitting: *Deleted

## 2014-09-15 ENCOUNTER — Ambulatory Visit: Payer: Medicaid Other | Admitting: Gastroenterology

## 2014-10-30 ENCOUNTER — Encounter (HOSPITAL_COMMUNITY): Payer: Self-pay | Admitting: *Deleted

## 2014-10-30 ENCOUNTER — Emergency Department (HOSPITAL_COMMUNITY)
Admission: EM | Admit: 2014-10-30 | Discharge: 2014-10-30 | Disposition: A | Discharge status: Home / Self Care | Payer: Medicaid Other | Attending: Emergency Medicine | Admitting: Emergency Medicine

## 2014-10-30 ENCOUNTER — Emergency Department (HOSPITAL_COMMUNITY): Payer: Medicaid Other

## 2014-10-30 DIAGNOSIS — Z8659 Personal history of other mental and behavioral disorders: Secondary | ICD-10-CM | POA: Diagnosis not present

## 2014-10-30 DIAGNOSIS — Z3202 Encounter for pregnancy test, result negative: Secondary | ICD-10-CM | POA: Insufficient documentation

## 2014-10-30 DIAGNOSIS — Z720 Tobacco use: Secondary | ICD-10-CM | POA: Diagnosis not present

## 2014-10-30 DIAGNOSIS — Z8781 Personal history of (healed) traumatic fracture: Secondary | ICD-10-CM | POA: Diagnosis not present

## 2014-10-30 DIAGNOSIS — R102 Pelvic and perineal pain: Secondary | ICD-10-CM | POA: Diagnosis not present

## 2014-10-30 DIAGNOSIS — Z8719 Personal history of other diseases of the digestive system: Secondary | ICD-10-CM | POA: Insufficient documentation

## 2014-10-30 DIAGNOSIS — Z87828 Personal history of other (healed) physical injury and trauma: Secondary | ICD-10-CM | POA: Diagnosis not present

## 2014-10-30 DIAGNOSIS — R1030 Lower abdominal pain, unspecified: Secondary | ICD-10-CM | POA: Diagnosis present

## 2014-10-30 DIAGNOSIS — Z8669 Personal history of other diseases of the nervous system and sense organs: Secondary | ICD-10-CM | POA: Diagnosis not present

## 2014-10-30 DIAGNOSIS — N730 Acute parametritis and pelvic cellulitis: Secondary | ICD-10-CM | POA: Insufficient documentation

## 2014-10-30 DIAGNOSIS — Z8614 Personal history of Methicillin resistant Staphylococcus aureus infection: Secondary | ICD-10-CM | POA: Diagnosis not present

## 2014-10-30 LAB — CBC WITH DIFFERENTIAL/PLATELET
Basophils Absolute: 0.1 10*3/uL (ref 0.0–0.1)
Basophils Relative: 1 % (ref 0–1)
Eosinophils Absolute: 0.5 10*3/uL (ref 0.0–0.7)
Eosinophils Relative: 7 % — ABNORMAL HIGH (ref 0–5)
HCT: 37.6 % (ref 36.0–46.0)
Hemoglobin: 13.3 g/dL (ref 12.0–15.0)
Lymphocytes Relative: 34 % (ref 12–46)
Lymphs Abs: 2.4 10*3/uL (ref 0.7–4.0)
MCH: 27.4 pg (ref 26.0–34.0)
MCHC: 35.4 g/dL (ref 30.0–36.0)
MCV: 77.4 fL — ABNORMAL LOW (ref 78.0–100.0)
Monocytes Absolute: 0.6 10*3/uL (ref 0.1–1.0)
Monocytes Relative: 8 % (ref 3–12)
Neutro Abs: 3.6 10*3/uL (ref 1.7–7.7)
Neutrophils Relative %: 51 % (ref 43–77)
Platelets: 220 10*3/uL (ref 150–400)
RBC: 4.86 MIL/uL (ref 3.87–5.11)
RDW: 14.5 % (ref 11.5–15.5)
WBC: 7 10*3/uL (ref 4.0–10.5)

## 2014-10-30 LAB — URINALYSIS, ROUTINE W REFLEX MICROSCOPIC
Bilirubin Urine: NEGATIVE
Glucose, UA: NEGATIVE mg/dL
Hgb urine dipstick: NEGATIVE
Ketones, ur: NEGATIVE mg/dL
Leukocytes, UA: NEGATIVE
Nitrite: NEGATIVE
Protein, ur: NEGATIVE mg/dL
Specific Gravity, Urine: 1.022 (ref 1.005–1.030)
Urobilinogen, UA: 0.2 mg/dL (ref 0.0–1.0)
pH: 8 (ref 5.0–8.0)

## 2014-10-30 LAB — COMPREHENSIVE METABOLIC PANEL
ALT: 9 U/L (ref 0–35)
AST: 10 U/L (ref 0–37)
Albumin: 3.1 g/dL — ABNORMAL LOW (ref 3.5–5.2)
Alkaline Phosphatase: 63 U/L (ref 39–117)
Anion gap: 10 (ref 5–15)
BUN: 11 mg/dL (ref 6–23)
CO2: 25 mEq/L (ref 19–32)
Calcium: 8.5 mg/dL (ref 8.4–10.5)
Chloride: 106 mEq/L (ref 96–112)
Creatinine, Ser: 0.54 mg/dL (ref 0.50–1.10)
GFR calc Af Amer: >90 mL/min (ref >90)
GFR calc non Af Amer: >90 mL/min (ref >90)
Glucose, Bld: 93 mg/dL (ref 70–99)
Potassium: 3.9 mEq/L (ref 3.7–5.3)
Sodium: 141 mEq/L (ref 137–147)
Total Bilirubin: <0.2 mg/dL — ABNORMAL LOW (ref 0.3–1.2)
Total Protein: 5.9 g/dL — ABNORMAL LOW (ref 6.0–8.3)

## 2014-10-30 LAB — WET PREP, GENITAL
Trich, Wet Prep: NONE SEEN
Yeast Wet Prep HPF POC: NONE SEEN

## 2014-10-30 LAB — POC URINE PREG, ED: Preg Test, Ur: NEGATIVE

## 2014-10-30 MED ORDER — CEFIXIME 400 MG PO TABS
400.0000 mg | ORAL_TABLET | Freq: Once | ORAL | Status: AC
  Administered 2014-10-30: 400 mg via ORAL
  Filled 2014-10-30: qty 1

## 2014-10-30 MED ORDER — DOXYCYCLINE HYCLATE 100 MG PO CAPS: 100.0000 mg | ORAL_CAPSULE | Freq: Two times a day (BID) | ORAL | Status: DC

## 2014-10-30 MED ORDER — OXYCODONE-ACETAMINOPHEN 5-325 MG PO TABS
1.0000 | ORAL_TABLET | Freq: Once | ORAL | Status: AC
  Administered 2014-10-30: 1 via ORAL
  Filled 2014-10-30: qty 1

## 2014-10-30 MED ORDER — SODIUM CHLORIDE 0.9 % IV BOLUS (SEPSIS)
1000.0000 mL | Freq: Once | INTRAVENOUS | Status: AC
  Administered 2014-10-30: 1000 mL via INTRAVENOUS

## 2014-10-30 MED ORDER — CEFTRIAXONE SODIUM 250 MG IJ SOLR: 250.0000 mg | Freq: Once | INTRAMUSCULAR | Status: DC

## 2014-10-30 MED ORDER — HYDROCODONE-ACETAMINOPHEN 5-325 MG PO TABS: 1.0000 | ORAL_TABLET | Freq: Four times a day (QID) | ORAL | Status: DC | PRN

## 2014-10-30 MED ORDER — HYDROMORPHONE HCL 1 MG/ML IJ SOLN
1.0000 mg | Freq: Once | INTRAMUSCULAR | Status: AC
  Administered 2014-10-30: 1 mg via INTRAVENOUS
  Filled 2014-10-30: qty 1

## 2014-10-30 NOTE — ED Notes (Signed)
Patient transported to Ultrasound 

## 2014-10-30 NOTE — ED Notes (Signed)
Returned from U/S

## 2014-10-30 NOTE — ED Provider Notes (Signed)
CSN: [id number]     Arrival date & time 10/30/14  [id number] History   First MD Initiated Contact with Patient 10/30/14 [phone]     Chief Complaint  Patient presents with  . Abdominal Pain     (Consider location/radiation/quality/duration/timing/severity/associated sxs/prior Treatment) HPI Patient presents to the emergency department with lower abdominal pain that started 2 weeks ago.  The patient states that she has had increased vaginal discharge and odor.  The patient states that nothing seems make her condition better, but sexual activity makes the pain worse.  The patient states that she does not have any chest pain, shortness breath, fever, nausea, vomiting, weakness, dizziness, headache, blurred vision, back pain or syncope.  Patient states she did not take any medications prior to arrival Past Medical History  Diagnosis Date  . Anxiety   . Depression   . Bipolar disorder     no current med.  . Irritable bowel syndrome (IBS)     no current med.  [name]. Ulnar fracture 03/09/2014    left  . Scalp laceration     staples to be removed 03/13/2014  . History of MRSA infection 2010  . Apnea, sleep     no CPAP use, states lost machine during move to [place]   Past Surgical History  Procedure Laterality Date  . Ovarian cyst removal    . Tubal ligation    . Thyroid cyst excision      nodule exc.  [name]. Cyst excision      middle of back  . Orif ulnar fracture Left 03/16/2014    Procedure: OPEN REDUCTION INTERNAL FIXATION (ORIF) LEFT ULNAR FRACTURE;  Surgeon: Marlowe ShoresMatthew A Weingold, MD;  Location: [facility];  Service: Orthopedics;  Laterality: Left;  left   Family History  Problem Relation Age of Onset  . Heart disease Mother   . Cancer Maternal Grandmother     breast  . Cancer Paternal Grandmother     breast   History  Substance Use Topics  . Smoking status: Current Every Day Smoker -- 3 years    Types: Cigarettes  . Smokeless tobacco: Never Used     Comment: 1-2 cig./day  . Alcohol  Use: No   OB History    Gravida Para Term Preterm AB TAB SAB Ectopic Multiple Living   4 4 4       4      Review of Systems  All other systems negative except as documented in the HPI. All pertinent positives and negatives as reviewed in the HPI.  Allergies  Sulfa antibiotics; Toradol; Flagyl; Naproxen; and Tramadol  Home Medications   Prior to Admission medications   Medication Sig Start Date End Date Taking? Authorizing Provider  ibuprofen (ADVIL,MOTRIN) 800 MG tablet Take 800 mg by mouth every 8 (eight) hours as needed for mild pain.   Yes Historical Provider, MD  ciprofloxacin (CIPRO) 250 MG tablet Take 1 tablet (250 mg total) by mouth every 12 (twelve) hours. Patient not taking: Reported on 10/30/2014 09/08/14   Marny LowensteinJulie N Wenzel, PA-C  oxyCODONE-acetaminophen (ROXICET) 5-325 MG per tablet Take 1-2 tablets by mouth every 4 (four) hours as needed for severe pain. Patient not taking: Reported on 10/30/2014 09/08/14   Marny LowensteinJulie N Wenzel, PA-C   BP 111/70 mmHg  Pulse 74  Temp(Src) 98.3 F (36.8 C) (Oral)  Resp 18  Ht 5\' 7"  (1.702 m)  Wt 174 lb (78.926 kg)  BMI 27.25 kg/m2  SpO2 100%  LMP 09/13/2014 Physical Exam  Constitutional: She is oriented to person, place, and time. She appears well-developed and well-nourished. No distress.  HENT:  Head: Normocephalic and atraumatic.  Mouth/Throat: Oropharynx is clear and moist.  Eyes: Pupils are equal, round, and reactive to light.  Neck: Normal range of motion. Neck supple.  Cardiovascular: Normal rate, regular rhythm and normal heart sounds.  Exam reveals no gallop and no friction rub.   No murmur heard. Pulmonary/Chest: Effort normal and breath sounds normal. No respiratory distress.  Abdominal: Soft. Bowel sounds are normal. She exhibits no distension. There is tenderness. There is no rebound and no guarding.  Genitourinary: Cervix exhibits discharge. Cervix exhibits no motion tenderness. Right adnexum displays tenderness. Left  adnexum displays tenderness.  Musculoskeletal: She exhibits no edema.  Neurological: She is alert and oriented to person, place, and time.  Skin: Skin is warm and dry.  Psychiatric: She has a normal mood and affect. Her behavior is normal.  Nursing note and vitals reviewed.   ED Course  Procedures (including critical care time) Labs Review Labs Reviewed  WET PREP, GENITAL - Abnormal; Notable for the following:    Clue Cells Wet Prep HPF POC FEW (*)    WBC, Wet Prep HPF POC FEW (*)    All other components within normal limits  COMPREHENSIVE METABOLIC PANEL - Abnormal; Notable for the following:    Total Protein 5.9 (*)    Albumin 3.1 (*)    Total Bilirubin <0.2 (*)    All other components within normal limits  CBC WITH DIFFERENTIAL - Abnormal; Notable for the following:    MCV 77.4 (*)    Eosinophils Relative 7 (*)    All other components within normal limits  GC/CHLAMYDIA PROBE AMP  URINALYSIS, ROUTINE W REFLEX MICROSCOPIC  POC URINE PREG, ED    Imaging Review [name] Transvaginal Non-ob  10/30/2014   CLINICAL DATA:  Pelvic pain and cramping for 2 weeks. History of bilateral tubal ligation.  EXAM: TRANSABDOMINAL AND TRANSVAGINAL ULTRASOUND OF PELVIS  TECHNIQUE: Both transabdominal and transvaginal ultrasound examinations of the pelvis were performed. Transabdominal technique was performed for global imaging of the pelvis including uterus, ovaries, adnexal regions, and pelvic cul-de-sac. It was necessary to proceed with endovaginal exam following the transabdominal exam to visualize the ovaries to better advantage.  COMPARISON:  08/29/2014 ultrasound.  FINDINGS: Uterus  Measurements: 9.3 x 4.6 x 6.7 cm. No fibroids or other mass visualized.  Endometrium  Thickness: 6 mm.  No focal abnormality visualized.  Right ovary  Measurements: 3.0 x 2.2 x 2.2 cm. Normal appearance/no adnexal mass. There is normal color flow with Doppler.  Left ovary  Measurements: 2.9 x 1.9 x 2.3 cm. Normal  appearance/no adnexal mass. There is normal blood flow with color Doppler.  Other findings  No free fluid.  IMPRESSION: Negative pelvic ultrasound. No explanation for the patient's symptoms demonstrated.   Electronically Signed   By: Roxy HorsemanBill  Veazey M.D.   On: 10/30/2014 10:29   [name] Pelvis Complete  10/30/2014   CLINICAL DATA:  Pelvic pain and cramping for 2 weeks. History of bilateral tubal ligation.  EXAM: TRANSABDOMINAL AND TRANSVAGINAL ULTRASOUND OF PELVIS  TECHNIQUE: Both transabdominal and transvaginal ultrasound examinations of the pelvis were performed. Transabdominal technique was performed for global imaging of the pelvis including uterus, ovaries, adnexal regions, and pelvic cul-de-sac. It was necessary to proceed with endovaginal exam following the transabdominal exam to visualize the ovaries to better advantage.  COMPARISON:  08/29/2014 ultrasound.  FINDINGS: Uterus  Measurements: 9.3 x 4.6  x 6.7 cm. No fibroids or other mass visualized.  Endometrium  Thickness: 6 mm.  No focal abnormality visualized.  Right ovary  Measurements: 3.0 x 2.2 x 2.2 cm. Normal appearance/no adnexal mass. There is normal color flow with Doppler.  Left ovary  Measurements: 2.9 x 1.9 x 2.3 cm. Normal appearance/no adnexal mass. There is normal blood flow with color Doppler.  Other findings  No free fluid.  IMPRESSION: Negative pelvic ultrasound. No explanation for the patient's symptoms demonstrated.   Electronically Signed   By: Roxy Horseman M.D.   On: 10/30/2014 10:29      MDM   Final diagnoses:  Pelvic pain in female   Patient will be treated for STDs.  Told to return here as needed.  Patient is referred back to her GYN.  Patient has been stable here in the emergency department    Carlyle Dolly, PA-C 11/03/14 0006  Juliet Rude. Rubin Payor, MD 11/03/14 [phone]

## 2014-10-30 NOTE — ED Notes (Signed)
Pt reports lower abd pain and cramping for days and foul vaginal odor but denies any discharge/itching.

## 2014-10-30 NOTE — ED Notes (Signed)
Thayer Ohmhris, [place] notified that pt needs to be able to leave by 1:30pm in order to pick up her children from school.

## 2014-10-30 NOTE — ED Notes (Signed)
Pelvic cart set up 

## 2014-10-30 NOTE — ED Notes (Signed)
Patient c/o intermittent sharp abd. Pain onset 2 weeks ago , states she is having some nausea no vomiting. LMP Nov. 1, States when she has "sex with her boyfriend she notices an odor". Denies discharge.

## 2014-10-30 NOTE — Discharge Instructions (Signed)
Return here as needed.  Follow-up with the clinic provided.  Increase her fluid intake

## 2014-10-31 LAB — GC/CHLAMYDIA PROBE AMP
CT Probe RNA: NEGATIVE
GC Probe RNA: NEGATIVE

## 2014-11-09 ENCOUNTER — Telehealth (HOSPITAL_BASED_OUTPATIENT_CLINIC_OR_DEPARTMENT_OTHER): Payer: Self-pay | Admitting: *Deleted

## 2014-11-09 ENCOUNTER — Encounter: Payer: Self-pay | Admitting: *Deleted

## 2014-11-10 ENCOUNTER — Encounter: Payer: Self-pay | Admitting: Obstetrics & Gynecology

## 2014-11-16 ENCOUNTER — Emergency Department (HOSPITAL_COMMUNITY)
Admission: EM | Admit: 2014-11-16 | Discharge: 2014-11-16 | Disposition: A | Discharge status: Home / Self Care | Payer: Medicaid Other | Attending: Emergency Medicine | Admitting: Emergency Medicine

## 2014-11-16 ENCOUNTER — Encounter (HOSPITAL_COMMUNITY): Payer: Self-pay | Admitting: Cardiology

## 2014-11-16 ENCOUNTER — Emergency Department (HOSPITAL_COMMUNITY): Payer: Medicaid Other

## 2014-11-16 DIAGNOSIS — Z8781 Personal history of (healed) traumatic fracture: Secondary | ICD-10-CM | POA: Insufficient documentation

## 2014-11-16 DIAGNOSIS — Z720 Tobacco use: Secondary | ICD-10-CM | POA: Insufficient documentation

## 2014-11-16 DIAGNOSIS — Z8659 Personal history of other mental and behavioral disorders: Secondary | ICD-10-CM | POA: Diagnosis not present

## 2014-11-16 DIAGNOSIS — Z792 Long term (current) use of antibiotics: Secondary | ICD-10-CM | POA: Diagnosis not present

## 2014-11-16 DIAGNOSIS — Z8669 Personal history of other diseases of the nervous system and sense organs: Secondary | ICD-10-CM | POA: Insufficient documentation

## 2014-11-16 DIAGNOSIS — R05 Cough: Secondary | ICD-10-CM | POA: Diagnosis present

## 2014-11-16 DIAGNOSIS — J40 Bronchitis, not specified as acute or chronic: Secondary | ICD-10-CM | POA: Diagnosis not present

## 2014-11-16 DIAGNOSIS — Z8719 Personal history of other diseases of the digestive system: Secondary | ICD-10-CM | POA: Diagnosis not present

## 2014-11-16 DIAGNOSIS — Z8614 Personal history of Methicillin resistant Staphylococcus aureus infection: Secondary | ICD-10-CM | POA: Diagnosis not present

## 2014-11-16 DIAGNOSIS — R059 Cough, unspecified: Secondary | ICD-10-CM

## 2014-11-16 LAB — RAPID STREP SCREEN (MED CTR MEBANE ONLY): Streptococcus, Group A Screen (Direct): NEGATIVE

## 2014-11-16 MED ORDER — ALBUTEROL SULFATE HFA 108 (90 BASE) MCG/ACT IN AERS: 2.0000 | INHALATION_SPRAY | RESPIRATORY_TRACT | Status: DC | PRN

## 2014-11-16 MED ORDER — PHENYLEPHRINE-DM-GG-APAP 5-10-200-325 MG PO CAPS: 2.0000 | ORAL_CAPSULE | Freq: Two times a day (BID) | ORAL | Status: DC

## 2014-11-16 MED ORDER — ALBUTEROL SULFATE (2.5 MG/3ML) 0.083% IN NEBU
5.0000 mg | INHALATION_SOLUTION | Freq: Once | RESPIRATORY_TRACT | Status: AC
  Administered 2014-11-16: 5 mg via RESPIRATORY_TRACT
  Filled 2014-11-16: qty 6

## 2014-11-16 MED ORDER — IPRATROPIUM BROMIDE 0.02 % IN SOLN
0.5000 mg | RESPIRATORY_TRACT | Status: AC
  Administered 2014-11-16: 0.5 mg via RESPIRATORY_TRACT
  Filled 2014-11-16: qty 2.5

## 2014-11-16 MED ORDER — PREDNISONE 20 MG PO TABS
60.0000 mg | ORAL_TABLET | Freq: Once | ORAL | Status: AC
  Administered 2014-11-16: 60 mg via ORAL
  Filled 2014-11-16: qty 3

## 2014-11-16 MED ORDER — ACETAMINOPHEN 325 MG PO TABS
650.0000 mg | ORAL_TABLET | Freq: Once | ORAL | Status: AC
  Administered 2014-11-16: 650 mg via ORAL
  Filled 2014-11-16: qty 2

## 2014-11-16 MED ORDER — LIDOCAINE VISCOUS 2 % MT SOLN
15.0000 mL | Freq: Once | OROMUCOSAL | Status: AC
  Administered 2014-11-16: 15 mL via OROMUCOSAL
  Filled 2014-11-16 (×2): qty 15

## 2014-11-16 MED ORDER — MAGIC MOUTHWASH W/LIDOCAINE: 5.0000 mL | Freq: Four times a day (QID) | ORAL | Status: DC | PRN

## 2014-11-16 MED ORDER — PREDNISONE 20 MG PO TABS: 40.0000 mg | ORAL_TABLET | Freq: Every day | ORAL | Status: DC

## 2014-11-16 NOTE — ED Notes (Signed)
Declined W/C at D/C and was escorted to lobby by RN. Pt had to pick child up at school left with out vital signs.

## 2014-11-16 NOTE — ED Provider Notes (Signed)
CSN: [id number]     Arrival date & time 11/16/14  1046 History  This chart was scribed for non-physician practitioner, Donetta Potts, NP working with Geoffery Lyons, MD, by Jarvis Morgan, ED Scribe. This patient was seen in room TR04C/TR04C and the patient's care was started at 12:00 PM.    Chief Complaint  Patient presents with  . Cough    The history is provided by the patient. No language interpreter was used.    HPI Comments: Leah Barry is a 29 y.o. female with a h/o anxiety, depression, bipolar disorder, sleep apnea and IBS who presents to the Emergency Department complaining of an intermittent, moderate, productive, cough for 3 days. Pt states that the symptoms started as rhinorrhea and then developed into a cough with associated, chest tightness, sore throat, congestion and mild subjective fever. She has used vapor rub and taken Alka Seltzer plus, Dayquil, Nyquil and Ibuprofen with no relief. She denies any nausea, vomiting, abdominal pain,  or shortness of breath.     Past Medical History  Diagnosis Date  . Anxiety   . Depression   . Bipolar disorder     no current med.  . Irritable bowel syndrome (IBS)     no current med.  [name] Ulnar fracture 03/09/2014    left  . Scalp laceration     staples to be removed 03/13/2014  . History of MRSA infection 2010  . Apnea, sleep     no CPAP use, states lost machine during move to  [place]   Past Surgical History  Procedure Laterality Date  . Ovarian cyst removal    . Tubal ligation    . Thyroid cyst excision      nodule exc.  [name] Cyst excision      middle of back  . Orif ulnar fracture Left 03/16/2014    Procedure: OPEN REDUCTION INTERNAL FIXATION (ORIF) LEFT ULNAR FRACTURE;  Surgeon: Marlowe [place], MD;  Location: [facility];  Service: Orthopedics;  Laterality: Left;  left   Family History  Problem Relation Age of Onset  . Heart disease Mother   . Cancer Maternal Grandmother     breast  . Cancer Paternal  Grandmother     breast   History  Substance Use Topics  . Smoking status: Current Every Day Smoker -- 3 years    Types: Cigarettes  . Smokeless tobacco: Never Used     Comment: 1-2 cig./day  . Alcohol Use: No   OB History    Gravida Para Term Preterm AB TAB SAB Ectopic Multiple Living   Review of Systems  Constitutional: Positive for fever (mild subjective).  HENT: Positive for congestion, rhinorrhea and sore throat.   Respiratory: Positive for cough and chest tightness. Negative for shortness of breath.   Gastrointestinal: Negative for nausea, vomiting and abdominal pain.      Allergies  Sulfa antibiotics; Toradol; Flagyl; Naproxen; and Tramadol  Home Medications   Prior to Admission medications   Medication Sig Start Date End Date Taking? Authorizing Provider  ciprofloxacin (CIPRO) 250 MG tablet Take 1 tablet (250 mg total) by mouth every 12 (twelve) hours. Patient not taking: Reported on 10/30/2014 09/08/14   Marny Lowenstein, PA-C  doxycycline (VIBRAMYCIN) 100 MG capsule Take 1 capsule (100 mg total) by mouth 2 (two) times daily. 10/30/14   Carlyle Dolly, PA-C  HYDROcodone-acetaminophen (NORCO/VICODIN) 5-325 MG per tablet Take 1 tablet  by mouth every 6 (six) hours as needed for moderate pain. 10/30/14   Jamesetta Orleans Lawyer, PA-C  ibuprofen (ADVIL,MOTRIN) 800 MG tablet Take 800 mg by mouth every 8 (eight) hours as needed for mild pain.    Historical Provider, MD  oxyCODONE-acetaminophen (ROXICET) 5-325 MG per tablet Take 1-2 tablets by mouth every 4 (four) hours as needed for severe pain. Patient not taking: Reported on 10/30/2014 09/08/14   Marny Lowenstein, PA-C   Triage Vitals: BP 129/81 mmHg  Pulse 95  Temp(Src) 98.7 F (37.1 C) (Oral)  Resp 18  Ht 5\' 7"  (1.702 m)  Wt 175 lb (79.379 kg)  BMI 27.40 kg/m2  SpO2 100%  LMP 11/11/2014  Physical Exam  Constitutional: She is oriented to person, place, and time. She appears well-developed and  well-nourished. No distress.  HENT:  Head: Normocephalic and atraumatic.  Mouth/Throat: Uvula is midline. Posterior oropharyngeal edema and posterior oropharyngeal erythema present. No oropharyngeal exudate.  Right tonsil appearance similar to tonsillar stone noted  Eyes: Conjunctivae and EOM are normal.  Neck: Neck supple. No tracheal deviation present.  Cardiovascular: Normal rate, regular rhythm, S1 normal, S2 normal and normal heart sounds.   Pulmonary/Chest: Effort normal. No respiratory distress. She has no decreased breath sounds. She has wheezes (mild bilaterally).  Musculoskeletal: Normal range of motion.  Neurological: She is alert and oriented to person, place, and time.  Skin: Skin is warm and dry.  Psychiatric: She has a normal mood and affect. Her behavior is normal.  Nursing note and vitals reviewed.   ED Course  Procedures (including critical care time)  DIAGNOSTIC STUDIES: Oxygen Saturation is 100% on RA, normal by my interpretation.    COORDINATION OF CARE: 12:06 PM- Will order CXR.  Pt advised of plan for treatment and pt agrees.  Labs Review Labs Reviewed  RAPID STREP SCREEN  CULTURE, GROUP A STREP    Imaging Review Dg Chest 2 View  11/16/2014   CLINICAL DATA:  Three day history of cough, mid chest pain and sore throat.  EXAM: CHEST  2 VIEW  COMPARISON:  03/05/2014 dating back to 05/01/2012.  FINDINGS: Cardiomediastinal silhouette unremarkable, unchanged. Lungs clear. Bronchovascular markings normal. Pulmonary vascularity normal. No visible pleural effusions. No pneumothorax. Visualized bony thorax intact. No significant interval change. Slight thoracic scoliosis convex right is felt to be positional, as it was not present on the prior examinations.  IMPRESSION: No acute cardiopulmonary disease.  Stable examination.   Electronically Signed   By: Hulan Saas M.D.   On: 11/16/2014 12:19     EKG Interpretation None      MDM   Final diagnoses:  Cough   Bronchitis   29 yo with symptoms consistent with URI and bronchitis. CXR negative for infiltrate. Discussed that antibiotics are not indicated for viral infections. Pt will be discharged with symptomatic treatment. Pt is well-appearing, in no acute distress and vital signs are stable.  They appear safe to be discharged.  Discharge include follow-up with their PCP.  Return precautions provided.  He verbalizes understanding and is agreeable with plan.   I personally performed the services described in this documentation, which was scribed in my presence. The recorded information has been reviewed and is accurate.  Filed Vitals:   11/16/14 1102  BP: 129/81  Pulse: 95  Temp: 98.7 F (37.1 C)  TempSrc: Oral  Resp: 18  Height: 5\' 7"  (1.702 m)  Weight: 175 lb (79.379 kg)  SpO2: 100%   Meds given in ED:  Medications  albuterol (PROVENTIL) (2.5 MG/3ML) 0.083% nebulizer solution 5 mg (5 mg Nebulization Given 11/16/14 1233)  ipratropium (ATROVENT) nebulizer solution 0.5 mg (0.5 mg Nebulization Given 11/16/14 1233)  predniSONE (DELTASONE) tablet 60 mg (60 mg Oral Given 11/16/14 1233)  acetaminophen (TYLENOL) tablet 650 mg (650 mg Oral Given 11/16/14 1233)  lidocaine (XYLOCAINE) 2 % viscous mouth solution 15 mL (15 mLs Mouth/Throat Given 11/16/14 1238)    Discharge Medication List as of 11/16/2014 12:40 PM    START taking these medications   Details  albuterol (PROVENTIL HFA;VENTOLIN HFA) 108 (90 BASE) MCG/ACT inhaler Inhale 2 puffs into the lungs every 4 (four) hours as needed for wheezing or shortness of breath., Starting 11/16/2014, Until Discontinued, Print    Alum & Mag Hydroxide-Simeth (MAGIC MOUTHWASH W/LIDOCAINE) SOLN Take 5 mLs by mouth 4 (four) times daily as needed for mouth pain., Starting 11/16/2014, Until Discontinued, Print    Phenylephrine-DM-GG-APAP (MUCINEX FAST-MAX) 5-10-200-325 MG CAPS Take 2 capsules by mouth 2 (two) times daily., Starting 11/16/2014, Until Discontinued, Print     predniSONE (DELTASONE) 20 MG tablet Take 2 tablets (40 mg total) by mouth daily., Starting 11/16/2014, Until Discontinued, Print         Harle Battiest, NP 11/16/14 0981  Geoffery Lyons, MD 11/17/14 [phone]

## 2014-11-16 NOTE — ED Notes (Signed)
Pt reports a cough and congestion for the past 3 days. States she has tried OTC medication without much relief.

## 2014-11-16 NOTE — Discharge Instructions (Signed)
Please follow the directions provided. Be sure to follow-up with a primary care provider to ensure you're getting better. You may use the albuterol inhaler 2 puffs every 4 hours as needed for cough or wheezing. Take the prednisone daily to help with inflammation to reduce your symptoms. Please take the multisymptom cold medicine 2 capsules twice a day. You may use the Magic mouthwash to gargle with and without to help with throat pain. Don't hesitate to return for any new, worsening, or concerning symptoms.  SEEK IMMEDIATE MEDICAL CARE IF:  You develop an increased fever or chills.  You have chest pain.  You have severe shortness of breath.  You have bloody sputum.  You develop dehydration.  You faint or repeatedly feel like you are going to pass out.  You develop repeated vomiting.  You develop a severe headache.

## 2014-11-18 LAB — CULTURE, GROUP A STREP

## 2014-12-01 ENCOUNTER — Other Ambulatory Visit: Payer: Self-pay | Admitting: Obstetrics and Gynecology

## 2014-12-01 NOTE — Telephone Encounter (Signed)
Patient is calling the office and complains that no one has called her back about her refill request for Ibuprofen and Metrogel. Do not see previous notes that she has requested that here. Will send request to Dr Clearance CootsHarper.

## 2014-12-06 ENCOUNTER — Emergency Department (HOSPITAL_COMMUNITY)
Admission: EM | Admit: 2014-12-06 | Discharge: 2014-12-06 | Disposition: A | Discharge status: Home / Self Care | Payer: Medicaid Other | Attending: Emergency Medicine | Admitting: Emergency Medicine

## 2014-12-06 ENCOUNTER — Encounter (HOSPITAL_COMMUNITY): Payer: Self-pay | Admitting: Emergency Medicine

## 2014-12-06 DIAGNOSIS — Z8669 Personal history of other diseases of the nervous system and sense organs: Secondary | ICD-10-CM | POA: Insufficient documentation

## 2014-12-06 DIAGNOSIS — Z9851 Tubal ligation status: Secondary | ICD-10-CM | POA: Diagnosis not present

## 2014-12-06 DIAGNOSIS — Z8781 Personal history of (healed) traumatic fracture: Secondary | ICD-10-CM | POA: Insufficient documentation

## 2014-12-06 DIAGNOSIS — Z8659 Personal history of other mental and behavioral disorders: Secondary | ICD-10-CM | POA: Diagnosis not present

## 2014-12-06 DIAGNOSIS — Z8614 Personal history of Methicillin resistant Staphylococcus aureus infection: Secondary | ICD-10-CM | POA: Diagnosis not present

## 2014-12-06 DIAGNOSIS — Z9889 Other specified postprocedural states: Secondary | ICD-10-CM | POA: Insufficient documentation

## 2014-12-06 DIAGNOSIS — R300 Dysuria: Secondary | ICD-10-CM | POA: Diagnosis present

## 2014-12-06 DIAGNOSIS — Z3202 Encounter for pregnancy test, result negative: Secondary | ICD-10-CM | POA: Insufficient documentation

## 2014-12-06 DIAGNOSIS — N898 Other specified noninflammatory disorders of vagina: Secondary | ICD-10-CM

## 2014-12-06 DIAGNOSIS — Z79899 Other long term (current) drug therapy: Secondary | ICD-10-CM | POA: Diagnosis not present

## 2014-12-06 DIAGNOSIS — Z720 Tobacco use: Secondary | ICD-10-CM | POA: Diagnosis not present

## 2014-12-06 DIAGNOSIS — Z8719 Personal history of other diseases of the digestive system: Secondary | ICD-10-CM | POA: Insufficient documentation

## 2014-12-06 LAB — URINALYSIS, ROUTINE W REFLEX MICROSCOPIC
Bilirubin Urine: NEGATIVE
Glucose, UA: NEGATIVE mg/dL
Ketones, ur: NEGATIVE mg/dL
Leukocytes, UA: NEGATIVE
Nitrite: NEGATIVE
Protein, ur: NEGATIVE mg/dL
Specific Gravity, Urine: 1.025 (ref 1.005–1.030)
Urobilinogen, UA: 0.2 mg/dL (ref 0.0–1.0)
pH: 5 (ref 5.0–8.0)

## 2014-12-06 LAB — WET PREP, GENITAL
Trich, Wet Prep: NONE SEEN
Yeast Wet Prep HPF POC: NONE SEEN

## 2014-12-06 LAB — POC URINE PREG, ED: Preg Test, Ur: NEGATIVE

## 2014-12-06 LAB — URINE MICROSCOPIC-ADD ON

## 2014-12-06 MED ORDER — AZITHROMYCIN 250 MG PO TABS
1000.0000 mg | ORAL_TABLET | Freq: Once | ORAL | Status: AC
  Administered 2014-12-06: 1000 mg via ORAL
  Filled 2014-12-06: qty 4

## 2014-12-06 MED ORDER — CEFTRIAXONE SODIUM 250 MG IJ SOLR
250.0000 mg | Freq: Once | INTRAMUSCULAR | Status: AC
  Administered 2014-12-06: 250 mg via INTRAMUSCULAR
  Filled 2014-12-06: qty 250

## 2014-12-06 MED ORDER — LIDOCAINE HCL (PF) 1 % IJ SOLN
INTRAMUSCULAR | Status: AC
  Administered 2014-12-06: 0.9 mL
  Filled 2014-12-06: qty 5

## 2014-12-06 NOTE — Discharge Instructions (Signed)
Dysuria °Dysuria is the medical term for pain with urination. There are many causes for dysuria, but urinary tract infection is the most common. If a urinalysis was performed it can show that there is a urinary tract infection. A urine culture confirms that you or your child is sick. You will need to follow up with a healthcare provider because: °· If a urine culture was done you will need to know the culture results and treatment recommendations. °· If the urine culture was positive, you or your child will need to be put on antibiotics or know if the antibiotics prescribed are the right antibiotics for your urinary tract infection. °· If the urine culture is negative (no urinary tract infection), then other causes may need to be explored or antibiotics need to be stopped. °Today laboratory work may have been done and there does not seem to be an infection. If cultures were done they will take at least 24 to 48 hours to be completed. °Today x-rays may have been taken and they read as normal. No cause can be found for the problems. The x-rays may be re-read by a radiologist and you will be contacted if additional findings are made. °You or your child may have been put on medications to help with this problem until you can see your primary caregiver. If the problems get better, see your primary caregiver if the problems return. If you were given antibiotics (medications which kill germs), take all of the mediations as directed for the full course of treatment.  °If laboratory work was done, you need to find the results. Leave a telephone number where you can be reached. If this is not possible, make sure you find out how you are to get test results. °HOME CARE INSTRUCTIONS  °· Drink lots of fluids. For adults, drink eight, 8 ounce glasses of clear juice or water a day. For children, replace fluids as suggested by your caregiver. °· Empty the bladder often. Avoid holding urine for long periods of time. °· After a bowel  movement, women should cleanse front to back, using each tissue only once. °· Empty your bladder before and after sexual intercourse. °· Take all the medicine given to you until it is gone. You may feel better in a few days, but TAKE ALL MEDICINE. °· Avoid caffeine, tea, alcohol and carbonated beverages, because they tend to irritate the bladder. °· In men, alcohol may irritate the prostate. °· Only take over-the-counter or prescription medicines for pain, discomfort, or fever as directed by your caregiver. °· If your caregiver has given you a follow-up appointment, it is very important to keep that appointment. Not keeping the appointment could result in a chronic or permanent injury, pain, and disability. If there is any problem keeping the appointment, you must call back to this facility for assistance. °SEEK IMMEDIATE MEDICAL CARE IF:  °· Back pain develops. °· A fever develops. °· There is nausea (feeling sick to your stomach) or vomiting (throwing up). °· Problems are no better with medications or are getting worse. °MAKE SURE YOU:  °· Understand these instructions. °· Will watch your condition. °· Will get help right away if you are not doing well or get worse. °Document Released: 07/28/2004 Document Revised: 01/22/2012 Document Reviewed: 06/04/2008 °ExitCare® Patient Information ©2015 [employer]. This information is not intended to replace advice given to you by your health care provider. Make sure you discuss any questions you have with your health care provider. °Sexually Transmitted Disease °A   sexually transmitted disease (STD) is a disease or infection that may be passed (transmitted) from person to person, usually during sexual activity. This may happen by way of saliva, semen, blood, vaginal mucus, or urine. Common STDs include:  °· Gonorrhea.   °· Chlamydia.   °· Syphilis.   °· HIV and AIDS.   °· Genital herpes.   °· Hepatitis B and C.   °· Trichomonas.   °· Human papillomavirus (HPV).   °· Pubic  lice.   °· Scabies. °· Mites. °· Bacterial vaginosis. °WHAT ARE CAUSES OF STDs? °An STD may be caused by bacteria, a virus, or parasites. STDs are often transmitted during sexual activity if one person is infected. However, they may also be transmitted through nonsexual means. STDs may be transmitted after:  °· Sexual intercourse with an infected person.   °· Sharing sex toys with an infected person.   °· Sharing needles with an infected person or using unclean piercing or tattoo needles. °· Having intimate contact with the genitals, mouth, or rectal areas of an infected person.   °· Exposure to infected fluids during birth. °WHAT ARE THE SIGNS AND SYMPTOMS OF STDs? °Different STDs have different symptoms. Some people may not have any symptoms. If symptoms are present, they may include:  °· Painful or bloody urination.   °· Pain in the pelvis, abdomen, vagina, anus, throat, or eyes.   °· A skin rash, itching, or irritation. °· Growths, ulcerations, blisters, or sores in the genital and anal areas. °· Abnormal vaginal discharge with or without bad odor.   °· Penile discharge in men.   °· Fever.   °· Pain or bleeding during sexual intercourse.   °· Swollen glands in the groin area.   °· Yellow skin and eyes (jaundice). This is seen with hepatitis.   °· Swollen testicles. °· Infertility. °· Sores and blisters in the mouth. °HOW ARE STDs DIAGNOSED? °To make a diagnosis, your health care provider may:  °· Take a medical history.   °· Perform a physical exam.   °· Take a sample of any discharge to examine. °· Swab the throat, cervix, opening to the penis, rectum, or vagina for testing. °· Test a sample of your first morning urine.   °· Perform blood tests.   °· Perform a Pap test, if this applies.   °· Perform a colposcopy.   °· Perform a laparoscopy.   °HOW ARE STDs TREATED? ° Treatment depends on the STD. Some STDs may be treated but not cured.  °· Chlamydia, gonorrhea, trichomonas, and syphilis can be cured with  antibiotic medicine.   °· Genital herpes, hepatitis, and HIV can be treated, but not cured, with prescribed medicines. The medicines lessen symptoms.   °· Genital warts from HPV can be treated with medicine or by freezing, burning (electrocautery), or surgery. Warts may come back.   °· HPV cannot be cured with medicine or surgery. However, abnormal areas may be removed from the cervix, vagina, or vulva.   °· If your diagnosis is confirmed, your recent sexual partners need treatment. This is true even if they are symptom-free or have a negative culture or evaluation. They should not have sex until their health care providers say it is okay. °HOW CAN I REDUCE MY RISK OF GETTING AN STD? °Take these steps to reduce your risk of getting an STD: °· Use latex condoms, dental dams, and water-soluble lubricants during sexual activity. Do not use petroleum jelly or oils. °· Avoid having multiple sex partners. °· Do not have sex with someone who has other sex partners. °· Do not have sex with anyone you do not know or who is at high risk for   an STD. °· Avoid risky sex practices that can break your skin. °· Do not have sex if you have open sores on your mouth or skin. °· Avoid drinking too much alcohol or taking illegal drugs. Alcohol and drugs can affect your judgment and put you in a vulnerable position. °· Avoid engaging in oral and anal sex acts. °· Get vaccinated for HPV and hepatitis. If you have not received these vaccines in the past, talk to your health care provider about whether one or both might be right for you.   °· If you are at risk of being infected with HIV, it is recommended that you take a prescription medicine daily to prevent HIV infection. This is called pre-exposure prophylaxis (PrEP). You are considered at risk if: °¨ You are a man who has sex with other men (MSM). °¨ You are a heterosexual man or woman and are sexually active with more than one partner. °¨ You take drugs by injection. °¨ You are  sexually active with a partner who has HIV. °· Talk with your health care provider about whether you are at high risk of being infected with HIV. If you choose to begin PrEP, you should first be tested for HIV. You should then be tested every 3 months for as long as you are taking PrEP.   °WHAT SHOULD I DO IF I THINK I HAVE AN STD? °· See your health care provider.   °· Tell your sexual partner(s). They should be tested and treated for any STDs. °· Do not have sex until your health care provider says it is okay.  °WHEN SHOULD I GET IMMEDIATE MEDICAL CARE? °Contact your health care provider right away if:  °· You have severe abdominal pain. °· You are a man and notice swelling or pain in your testicles. °· You are a woman and notice swelling or pain in your vagina. °Document Released: 01/20/2003 Document Revised: 11/04/2013 Document Reviewed: 05/20/2013 °ExitCare® Patient Information ©2015 [employer]. This information is not intended to replace advice given to you by your health care provider. Make sure you discuss any questions you have with your health care provider. ° °

## 2014-12-06 NOTE — ED Provider Notes (Signed)
CSN: [id number]     Arrival date & time 12/06/14  1304 History   First MD Initiated Contact with Patient 12/06/14 1529     Chief Complaint  Patient presents with  . Vaginal Discharge  . Dysuria     (Consider location/radiation/quality/duration/timing/severity/associated sxs/prior Treatment) HPI Comments: Patient presents to the ED with a chief complaint of vaginal discharge and dysuria.  She states that the symptoms started on Thursday.  She reports have unprotected intercourse with a new partner.  She reports having a history of the same.  She states that she took some leftover doxycycline with no relief.  There are no aggravating factors.  She denies any fevers, chills, nausea, vomiting, diarrhea, chest pain or abdominal pain.  The history is provided by the patient. No language interpreter was used.    Past Medical History  Diagnosis Date  . Anxiety   . Depression   . Bipolar disorder     no current med.  . Irritable bowel syndrome (IBS)     no current med.  [name] Ulnar fracture 03/09/2014    left  . Scalp laceration     staples to be removed 03/13/2014  . History of MRSA infection 2010  . Apnea, sleep     no CPAP use, states lost machine during move to [place]   Past Surgical History  Procedure Laterality Date  . Ovarian cyst removal    . Tubal ligation    . Thyroid cyst excision      nodule exc.  [name] Cyst excision      middle of back  . Orif ulnar fracture Left 03/16/2014    Procedure: OPEN REDUCTION INTERNAL FIXATION (ORIF) LEFT ULNAR FRACTURE;  Surgeon: Marlowe Shores, MD;  Location: [facility];  Service: Orthopedics;  Laterality: Left;  left   Family History  Problem Relation Age of Onset  . Heart disease Mother   . Cancer Maternal Grandmother     breast  . Cancer Paternal Grandmother     breast   History  Substance Use Topics  . Smoking status: Current Every Day Smoker -- 3 years    Types: Cigarettes  . Smokeless tobacco: Never Used     Comment: 1-2  cig./day  . Alcohol Use: No   OB History    Gravida Para Term Preterm AB TAB SAB Ectopic Multiple Living   Review of Systems  Constitutional: Negative for fever and chills.  Respiratory: Negative for shortness of breath.   Cardiovascular: Negative for chest pain.  Gastrointestinal: Negative for nausea, vomiting, diarrhea and constipation.  Genitourinary: Positive for dysuria and vaginal discharge.  All other systems reviewed and are negative.     Allergies  Sulfa antibiotics; Toradol; Flagyl; Naproxen; Bactrim; and Tramadol  Home Medications   Prior to Admission medications   Medication Sig Start Date End Date Taking? Authorizing Provider  albuterol (PROVENTIL HFA;VENTOLIN HFA) 108 (90 BASE) MCG/ACT inhaler Inhale 2 puffs into the lungs every 4 (four) hours as needed for wheezing or shortness of breath. 11/16/14  Yes Harle Battiest, NP  ibuprofen (ADVIL,MOTRIN) 800 MG tablet Take 800 mg by mouth every 8 (eight) hours as needed for headache or moderate pain.   Yes Historical Provider, MD  Alum & Mag Hydroxide-Simeth (MAGIC MOUTHWASH W/LIDOCAINE) SOLN Take 5 mLs by mouth 4 (four) times daily as needed for mouth pain. Patient not taking: Reported on 12/06/2014 11/16/14   Lanora Manis  Tysinger, NP  ciprofloxacin (CIPRO) 250 MG tablet Take 1 tablet (250 mg total) by mouth every 12 (twelve) hours. Patient not taking: Reported on 10/30/2014 09/08/14   Marny Lowenstein, PA-C  doxycycline (VIBRAMYCIN) 100 MG capsule Take 1 capsule (100 mg total) by mouth 2 (two) times daily. Patient not taking: Reported on 12/06/2014 10/30/14   Jamesetta Orleans Lawyer, PA-C  HYDROcodone-acetaminophen (NORCO/VICODIN) 5-325 MG per tablet Take 1 tablet by mouth every 6 (six) hours as needed for moderate pain. Patient not taking: Reported on 12/06/2014 10/30/14   Jamesetta Orleans Lawyer, PA-C  ibuprofen (ADVIL,MOTRIN) 600 MG tablet take 1 tablet by mouth every 6 hours if needed for pain Patient not  taking: Reported on 12/06/2014 12/02/14   Deirdre Colin Mulders, CNM  oxyCODONE-acetaminophen (ROXICET) 5-325 MG per tablet Take 1-2 tablets by mouth every 4 (four) hours as needed for severe pain. Patient not taking: Reported on 10/30/2014 09/08/14   Marny Lowenstein, PA-C  Phenylephrine-DM-GG-APAP (MUCINEX FAST-MAX) 5-10-200-325 MG CAPS Take 2 capsules by mouth 2 (two) times daily. Patient not taking: Reported on 12/06/2014 11/16/14   Harle Battiest, NP  predniSONE (DELTASONE) 20 MG tablet Take 2 tablets (40 mg total) by mouth daily. Patient not taking: Reported on 12/06/2014 11/16/14   Harle Battiest, NP   BP 129/80 mmHg  Pulse 87  Temp(Src) 98.4 F (36.9 C) (Oral)  Resp 18  SpO2 99%  LMP 11/15/2014 Physical Exam  Constitutional: She is oriented to person, place, and time. She appears well-developed and well-nourished.  HENT:  Head: Normocephalic and atraumatic.  Eyes: Conjunctivae and EOM are normal. Pupils are equal, round, and reactive to light.  Neck: Normal range of motion. Neck supple.  Cardiovascular: Normal rate and regular rhythm.  Exam reveals no gallop and no friction rub.   No murmur heard. Pulmonary/Chest: Effort normal and breath sounds normal. No respiratory distress. She has no wheezes. She has no rales. She exhibits no tenderness.  Abdominal: Soft. Bowel sounds are normal. She exhibits no distension and no mass. There is no tenderness. There is no rebound and no guarding.  Genitourinary:  Pelvic exam chaperoned by female ER tech, no right or left adnexal tenderness, milder uterine tenderness, no vaginal discharge, scant amount of blood in vaginal vault, mild CMT, no friability, no foreign body, no injury to the external genitalia, no other significant findings   Musculoskeletal: Normal range of motion. She exhibits no edema or tenderness.  Neurological: She is alert and oriented to person, place, and time.  Skin: Skin is warm and dry.  Psychiatric: She has a normal mood and  affect. Her behavior is normal. Judgment and thought content normal.  Nursing note and vitals reviewed.   ED Course  Procedures (including critical care time) Results for orders placed or performed during the hospital encounter of 12/06/14  Wet prep, genital  Result Value Ref Range   Yeast Wet Prep HPF POC NONE SEEN NONE SEEN   Trich, Wet Prep NONE SEEN NONE SEEN   Clue Cells Wet Prep HPF POC FEW (A) NONE SEEN   WBC, Wet Prep HPF POC FEW (A) NONE SEEN  Urinalysis, Routine w reflex microscopic (if pt has temp above 100.62F)  Result Value Ref Range   Color, Urine YELLOW YELLOW   APPearance CLEAR CLEAR   Specific Gravity, Urine 1.025 1.005 - 1.030   pH 5.0 5.0 - 8.0   Glucose, UA NEGATIVE NEGATIVE mg/dL   Hgb urine dipstick MODERATE (A) NEGATIVE   Bilirubin Urine NEGATIVE NEGATIVE  Ketones, ur NEGATIVE NEGATIVE mg/dL   Protein, ur NEGATIVE NEGATIVE mg/dL   Urobilinogen, UA 0.2 0.0 - 1.0 mg/dL   Nitrite NEGATIVE NEGATIVE   Leukocytes, UA NEGATIVE NEGATIVE  Urine microscopic-add on  Result Value Ref Range   Squamous Epithelial / LPF RARE RARE   Urine-Other MUCOUS PRESENT   POC Urine Pregnancy, ED (if pre-menopausal female) - do NOT [facility]  Result Value Ref Range   Preg Test, Ur NEGATIVE NEGATIVE   Dg Chest 2 View  11/16/2014   CLINICAL DATA:  Three day history of cough, mid chest pain and sore throat.  EXAM: CHEST  2 VIEW  COMPARISON:  03/05/2014 dating back to 05/01/2012.  FINDINGS: Cardiomediastinal silhouette unremarkable, unchanged. Lungs clear. Bronchovascular markings normal. Pulmonary vascularity normal. No visible pleural effusions. No pneumothorax. Visualized bony thorax intact. No significant interval change. Slight thoracic scoliosis convex right is felt to be positional, as it was not present on the prior examinations.  IMPRESSION: No acute cardiopulmonary disease.  Stable examination.   Electronically Signed   By: Hulan Saashomas  Lawrence M.D.   On: 11/16/2014 12:19      Imaging Review No results found.   EKG Interpretation None      MDM   Final diagnoses:  Vaginal discharge  Dysuria    Patient with vaginal discharge and dysuria.  UA is clean, but will send for urine culture.  Given recent sexual encounter with new partner, will treat with azithromycin, and rocephin.  Recommend PCP/OBGYN follow-up.  Patient understands and agrees with the plan.    Roxy Horsemanobert Viliami Bracco, PA-C 12/06/14 1625  Purvis SheffieldForrest Harrison, MD 12/07/14 2229

## 2014-12-06 NOTE — ED Notes (Signed)
Pt from home c/o vaginal discharge, dysuria, and hematuria since thursday. Recent unprotected intercourse with new partner. HX of previous STD.

## 2014-12-07 LAB — GC/CHLAMYDIA PROBE AMP (~~LOC~~) NOT AT ARMC
Chlamydia: NEGATIVE
Neisseria Gonorrhea: NEGATIVE

## 2014-12-08 ENCOUNTER — Other Ambulatory Visit: Payer: Self-pay | Admitting: *Deleted

## 2014-12-08 ENCOUNTER — Telehealth: Payer: Self-pay

## 2014-12-08 ENCOUNTER — Telehealth: Payer: Self-pay | Admitting: *Deleted

## 2014-12-08 ENCOUNTER — Telehealth (HOSPITAL_COMMUNITY): Payer: Self-pay

## 2014-12-08 DIAGNOSIS — N760 Acute vaginitis: Principal | ICD-10-CM

## 2014-12-08 DIAGNOSIS — B9689 Other specified bacterial agents as the cause of diseases classified elsewhere: Secondary | ICD-10-CM

## 2014-12-08 MED ORDER — CLINDAMYCIN HCL 300 MG PO CAPS: 300.0000 mg | ORAL_CAPSULE | Freq: Two times a day (BID) | ORAL | Status: DC

## 2014-12-08 NOTE — Telephone Encounter (Signed)
Patient called, stated  was hospitalized with STD's and needs appt with Dr. Clearance CootsHarper right away and to call her - per Alisia FerrariJane, Andrea to call her

## 2014-12-08 NOTE — Telephone Encounter (Signed)
Upon investigation into patient's chart she did not have a positive STD result.  Attempted to contact the patient and left message for patient to call the office.

## 2014-12-08 NOTE — Telephone Encounter (Signed)
error 

## 2014-12-08 NOTE — Telephone Encounter (Signed)
Patient notified that STD results were negative but her wet prep showed BV. Patient advised a prescription will be sent to the pharmacy. Patient verbalized understanding.

## 2015-01-17 ENCOUNTER — Encounter (HOSPITAL_COMMUNITY): Payer: Self-pay | Admitting: *Deleted

## 2015-01-17 ENCOUNTER — Emergency Department (HOSPITAL_COMMUNITY)
Admission: EM | Admit: 2015-01-17 | Discharge: 2015-01-17 | Disposition: A | Discharge status: Home / Self Care | Payer: Medicaid Other | Attending: Emergency Medicine | Admitting: Emergency Medicine

## 2015-01-17 DIAGNOSIS — Z8781 Personal history of (healed) traumatic fracture: Secondary | ICD-10-CM | POA: Insufficient documentation

## 2015-01-17 DIAGNOSIS — Z3202 Encounter for pregnancy test, result negative: Secondary | ICD-10-CM | POA: Diagnosis not present

## 2015-01-17 DIAGNOSIS — N760 Acute vaginitis: Secondary | ICD-10-CM | POA: Insufficient documentation

## 2015-01-17 DIAGNOSIS — Z8669 Personal history of other diseases of the nervous system and sense organs: Secondary | ICD-10-CM | POA: Diagnosis not present

## 2015-01-17 DIAGNOSIS — Z720 Tobacco use: Secondary | ICD-10-CM | POA: Insufficient documentation

## 2015-01-17 DIAGNOSIS — R109 Unspecified abdominal pain: Secondary | ICD-10-CM | POA: Diagnosis present

## 2015-01-17 DIAGNOSIS — Z8659 Personal history of other mental and behavioral disorders: Secondary | ICD-10-CM | POA: Diagnosis not present

## 2015-01-17 DIAGNOSIS — Z8719 Personal history of other diseases of the digestive system: Secondary | ICD-10-CM | POA: Diagnosis not present

## 2015-01-17 DIAGNOSIS — Z87828 Personal history of other (healed) physical injury and trauma: Secondary | ICD-10-CM | POA: Diagnosis not present

## 2015-01-17 DIAGNOSIS — Z79899 Other long term (current) drug therapy: Secondary | ICD-10-CM | POA: Diagnosis not present

## 2015-01-17 DIAGNOSIS — B009 Herpesviral infection, unspecified: Secondary | ICD-10-CM

## 2015-01-17 DIAGNOSIS — B001 Herpesviral vesicular dermatitis: Secondary | ICD-10-CM | POA: Diagnosis not present

## 2015-01-17 DIAGNOSIS — B9689 Other specified bacterial agents as the cause of diseases classified elsewhere: Secondary | ICD-10-CM

## 2015-01-17 DIAGNOSIS — Z8614 Personal history of Methicillin resistant Staphylococcus aureus infection: Secondary | ICD-10-CM | POA: Insufficient documentation

## 2015-01-17 LAB — COMPREHENSIVE METABOLIC PANEL
ALT: 17 U/L (ref 0–35)
AST: 16 U/L (ref 0–37)
Albumin: 3.1 g/dL — ABNORMAL LOW (ref 3.5–5.2)
Alkaline Phosphatase: 55 U/L (ref 39–117)
Anion gap: 1 — ABNORMAL LOW (ref 5–15)
BUN: 8 mg/dL (ref 6–23)
CO2: 31 mmol/L (ref 19–32)
Calcium: 8.2 mg/dL — ABNORMAL LOW (ref 8.4–10.5)
Chloride: 109 mmol/L (ref 96–112)
Creatinine, Ser: 0.55 mg/dL (ref 0.50–1.10)
GFR calc Af Amer: >90 mL/min (ref >90)
GFR calc non Af Amer: >90 mL/min (ref >90)
Glucose, Bld: 105 mg/dL — ABNORMAL HIGH (ref 70–99)
Potassium: 3.5 mmol/L (ref 3.5–5.1)
Sodium: 141 mmol/L (ref 135–145)
Total Bilirubin: 0.3 mg/dL (ref 0.3–1.2)
Total Protein: 5.4 g/dL — ABNORMAL LOW (ref 6.0–8.3)

## 2015-01-17 LAB — URINE MICROSCOPIC-ADD ON

## 2015-01-17 LAB — CBC WITH DIFFERENTIAL/PLATELET
Basophils Absolute: 0 10*3/uL (ref 0.0–0.1)
Basophils Relative: 0 % (ref 0–1)
Eosinophils Absolute: 0.3 10*3/uL (ref 0.0–0.7)
Eosinophils Relative: 5 % (ref 0–5)
HCT: 36.9 % (ref 36.0–46.0)
Hemoglobin: 12.7 g/dL (ref 12.0–15.0)
Lymphocytes Relative: 46 % (ref 12–46)
Lymphs Abs: 2.9 10*3/uL (ref 0.7–4.0)
MCH: 26.7 pg (ref 26.0–34.0)
MCHC: 34.4 g/dL (ref 30.0–36.0)
MCV: 77.5 fL — ABNORMAL LOW (ref 78.0–100.0)
Monocytes Absolute: 0.5 10*3/uL (ref 0.1–1.0)
Monocytes Relative: 8 % (ref 3–12)
Neutro Abs: 2.6 10*3/uL (ref 1.7–7.7)
Neutrophils Relative %: 41 % — ABNORMAL LOW (ref 43–77)
Platelets: 228 10*3/uL (ref 150–400)
RBC: 4.76 MIL/uL (ref 3.87–5.11)
RDW: 13.8 % (ref 11.5–15.5)
WBC: 6.4 10*3/uL (ref 4.0–10.5)

## 2015-01-17 LAB — URINALYSIS, ROUTINE W REFLEX MICROSCOPIC
Glucose, UA: NEGATIVE mg/dL
Ketones, ur: 15 mg/dL — AB
Leukocytes, UA: NEGATIVE
Nitrite: NEGATIVE
Protein, ur: NEGATIVE mg/dL
Specific Gravity, Urine: 1.037 — ABNORMAL HIGH (ref 1.005–1.030)
Urobilinogen, UA: 1 mg/dL (ref 0.0–1.0)
pH: 6 (ref 5.0–8.0)

## 2015-01-17 LAB — WET PREP, GENITAL
Trich, Wet Prep: NONE SEEN
Yeast Wet Prep HPF POC: NONE SEEN

## 2015-01-17 LAB — POC URINE PREG, ED: Preg Test, Ur: NEGATIVE

## 2015-01-17 MED ORDER — ONDANSETRON 4 MG PO TBDP
8.0000 mg | ORAL_TABLET | Freq: Once | ORAL | Status: AC
  Administered 2015-01-17: 8 mg via ORAL
  Filled 2015-01-17: qty 2

## 2015-01-17 MED ORDER — HYDROCODONE-ACETAMINOPHEN 5-325 MG PO TABS: 1.0000 | ORAL_TABLET | Freq: Four times a day (QID) | ORAL | Status: DC | PRN

## 2015-01-17 MED ORDER — HYDROCODONE-ACETAMINOPHEN 5-325 MG PO TABS
1.0000 | ORAL_TABLET | Freq: Once | ORAL | Status: AC
  Administered 2015-01-17: 1 via ORAL
  Filled 2015-01-17: qty 1

## 2015-01-17 MED ORDER — ACYCLOVIR 400 MG PO TABS: 400.0000 mg | ORAL_TABLET | Freq: Three times a day (TID) | ORAL | Status: DC

## 2015-01-17 MED ORDER — CEFTRIAXONE SODIUM 250 MG IJ SOLR
250.0000 mg | Freq: Once | INTRAMUSCULAR | Status: AC
  Administered 2015-01-17: 250 mg via INTRAMUSCULAR
  Filled 2015-01-17: qty 250

## 2015-01-17 MED ORDER — LIDOCAINE HCL (PF) 1 % IJ SOLN: 2.0000 mL | Freq: Once | INTRAMUSCULAR | Status: DC

## 2015-01-17 MED ORDER — CLINDAMYCIN HCL 150 MG PO CAPS: 300.0000 mg | ORAL_CAPSULE | Freq: Two times a day (BID) | ORAL | Status: DC

## 2015-01-17 MED ORDER — PROMETHAZINE HCL 25 MG PO TABS: 25.0000 mg | ORAL_TABLET | Freq: Four times a day (QID) | ORAL | Status: DC | PRN

## 2015-01-17 MED ORDER — DOXYCYCLINE HYCLATE 100 MG PO TABS
100.0000 mg | ORAL_TABLET | Freq: Once | ORAL | Status: AC
  Administered 2015-01-17: 100 mg via ORAL
  Filled 2015-01-17: qty 1

## 2015-01-17 MED ORDER — LIDOCAINE HCL (PF) 1 % IJ SOLN
2.0000 mL | INTRAMUSCULAR | Status: AC
  Administered 2015-01-17: 2 mL
  Filled 2015-01-17: qty 5

## 2015-01-17 MED ORDER — DOXYCYCLINE HYCLATE 100 MG PO CAPS: 100.0000 mg | ORAL_CAPSULE | Freq: Two times a day (BID) | ORAL | Status: DC

## 2015-01-17 NOTE — ED Notes (Signed)
Pt has multiple complaints. Reports generalized abd pain since last night, and has abscess to upper lip. Having nausea but denies vomiting. Airway intact.

## 2015-01-17 NOTE — Discharge Instructions (Signed)
Bacterial Vaginosis Bacterial vaginosis is a vaginal infection that occurs when the normal balance of bacteria in the vagina is disrupted. It results from an overgrowth of certain bacteria. This is the most common vaginal infection in women of childbearing age. Treatment is important to prevent complications, especially in pregnant women, as it can cause a premature delivery. CAUSES  Bacterial vaginosis is caused by an increase in harmful bacteria that are normally present in smaller amounts in the vagina. Several different kinds of bacteria can cause bacterial vaginosis. However, the reason that the condition develops is not fully understood. RISK FACTORS Certain activities or behaviors can put you at an increased risk of developing bacterial vaginosis, including:  Having a new sex partner or multiple sex partners.  Douching.  Using an intrauterine device (IUD) for contraception. Women do not get bacterial vaginosis from toilet seats, bedding, swimming pools, or contact with objects around them. SIGNS AND SYMPTOMS  Some women with bacterial vaginosis have no signs or symptoms. Common symptoms include:  Grey vaginal discharge.  A fishlike odor with discharge, especially after sexual intercourse.  Itching or burning of the vagina and vulva.  Burning or pain with urination. DIAGNOSIS  Your health care provider will take a medical history and examine the vagina for signs of bacterial vaginosis. A sample of vaginal fluid may be taken. Your health care provider will look at this sample under a microscope to check for bacteria and abnormal cells. A vaginal pH test may also be done.  TREATMENT  Bacterial vaginosis may be treated with antibiotic medicines. These may be given in the form of a pill or a vaginal cream. A second round of antibiotics may be prescribed if the condition comes back after treatment.  HOME CARE INSTRUCTIONS   Only take over-the-counter or prescription medicines as  directed by your health care provider.  If antibiotic medicine was prescribed, take it as directed. Make sure you finish it even if you start to feel better.  Do not have sex until treatment is completed.  Tell all sexual partners that you have a vaginal infection. They should see their health care provider and be treated if they have problems, such as a mild rash or itching.  Practice safe sex by using condoms and only having one sex partner. SEEK MEDICAL CARE IF:   Your symptoms are not improving after 3 days of treatment.  You have increased discharge or pain.  You have a fever. MAKE SURE YOU:   Understand these instructions.  Will watch your condition.  Will get help right away if you are not doing well or get worse. FOR MORE INFORMATION  Centers for Disease Control and Prevention, Division of STD Prevention: SolutionApps.co.zawww.cdc.gov/std American Sexual Health Association (ASHA): www.ashastd.org  Document Released: 10/30/2005 Document Revised: 08/20/2013 Document Reviewed: 06/11/2013 [facility] Patient Information 2015 [place], [place]. This information is not intended to replace advice given to you by your health care provider. Make sure you discuss any questions you have with your health care provider. Herpes Simplex Herpes simplex is generally classified as Type 1 or Type 2. Type 1 is generally the type that is responsible for cold sores. Type 2 is generally associated with sexually transmitted diseases. We now know that most of the thoughts on these viruses are inaccurate. We find that HSV1 is also present genitally and HSV2 can be present orally, but this will vary in different locations of the world. Herpes simplex is usually detected by doing a culture. Blood tests are also available  for this virus; however, the accuracy is often not as good.  PREPARATION FOR TEST No preparation or fasting is necessary. NORMAL FINDINGS  No virus present  No HSV antigens or antibodies present Ranges  for normal findings may vary among different laboratories and hospitals. You should always check with your doctor after having lab work or other tests done to discuss the meaning of your test results and whether your values are considered within normal limits. MEANING OF TEST  Your caregiver will go over the test results with you and discuss the importance and meaning of your results, as well as treatment options and the need for additional tests if necessary. OBTAINING THE TEST RESULTS  It is your responsibility to obtain your test results. Ask the lab or department performing the test when and how you will get your results. Document Released: 12/02/2004 Document Revised: 01/22/2012 Document Reviewed: 10/10/2008 [facility] Patient Information 2015 [place], [place]. This information is not intended to replace advice given to you by your health care provider. Make sure you discuss any questions you have with your health care provider. Pelvic Inflammatory Disease Pelvic inflammatory disease (PID) refers to an infection in some or all of the female organs. The infection can be in the uterus, ovaries, fallopian tubes, or the surrounding tissues in the pelvis. PID can cause abdominal or pelvic pain that comes on suddenly (acute pelvic pain). PID is a serious infection because it can lead to lasting (chronic) pelvic pain or the inability to have children (infertile).  CAUSES  The infection is often caused by the normal bacteria found in the vaginal tissues. PID may also be caused by an infection that is spread during sexual contact. PID can also occur following:   The birth of a baby.   A miscarriage.   An abortion.   Major pelvic surgery.   The use of an intrauterine device (IUD).   A sexual assault.  RISK FACTORS Certain factors can put a person at higher risk for PID, such as:  Being younger than 25 years.  Being sexually active at [place] age.  Usingnonbarrier  contraception.  Havingmultiple sexual partners.  Having sex with someone who has symptoms of a genital infection.  Using oral contraception. Other times, certain behaviors can increase the possibility of getting PID, such as:  Having sex during your period.  Using a vaginal douche.  Having an intrauterine device (IUD) in place. SYMPTOMS   Abdominal or pelvic pain.   Fever.   Chills.   Abnormal vaginal discharge.  Abnormal uterine bleeding.   Unusual pain shortly after finishing your period. DIAGNOSIS  Your caregiver will choose some of the following methods to make a diagnosis, such as:   Performinga physical exam and history. A pelvic exam typically reveals a very tender uterus and surrounding pelvis.   Ordering laboratory tests including a pregnancy test, blood tests, and urine test.  Orderingcultures of the vagina and cervix to check for a sexually transmitted infection (STI).  Performing an ultrasound.   Performing a laparoscopic procedure to look inside the pelvis.  TREATMENT   Antibiotic medicines may be prescribed and taken by mouth.   Sexual partners may be treated when the infection is caused by a sexually transmitted disease (STD).   Hospitalization may be needed to give antibiotics intravenously.  Surgery may be needed, but this is rare. It may take weeks until you are completely well. If you are diagnosed with PID, you should also be checked for human immunodeficiency virus (HIV).  HOME CARE INSTRUCTIONS   If given, take your antibiotics as directed. Finish the medicine even if you start to feel better.   Only take over-the-counter or prescription medicines for pain, discomfort, or fever as directed by your caregiver.   Do not have sexual intercourse until treatment is completed or as directed by your caregiver. If PID is confirmed, your recent sexual partner(s) will need treatment.   Keep your follow-up appointments. SEEK MEDICAL  CARE IF:   You have increased or abnormal vaginal discharge.   You need prescription medicine for your pain.   You vomit.   You cannot take your medicines.   Your partner has an STD.  SEEK IMMEDIATE MEDICAL CARE IF:   You have a fever.   You have increased abdominal or pelvic pain.   You have chills.   You have pain when you urinate.   You are not better after 72 hours following treatment.  MAKE SURE YOU:   Understand these instructions.  Will watch your condition.  Will get help right away if you are not doing well or get worse. Document Released: 10/30/2005 Document Revised: 02/24/2013 Document Reviewed: 10/26/2011 [facility] Patient Information 2015 [place], [place]. This information is not intended to replace advice given to you by your health care provider. Make sure you discuss any questions you have with your health care provider.

## 2015-01-17 NOTE — ED Provider Notes (Signed)
CSN: [id number]     Arrival date & time 01/17/15  1436 History   First MD Initiated Contact with Patient 01/17/15 1601     Chief Complaint  Patient presents with  . Abdominal Pain  . Abscess     Patient is a 29 y.o. female presenting with abdominal pain and abscess. The history is provided by the patient. No language interpreter was used.  Abdominal Pain Abscess  [name] presents for evaluation of abdominal pain and lip swelling. Her abdominal pain started 2-3 days ago and is described as central abdominal discomfort. The pain is described as a cramp and it is constant and nonradiating. There are no alleviating or worsening factors. She reports decreased appetite for the last day and nausea. She denies any fevers, vomiting, diarrhea. She has dysuria and is avoiding urinating because of the discomfort. She also describes fishy odor and vaginal discharge. The bumps on her left upper lip has been present for the last couple days. She has local pain and swelling. She reports that the bumps are more tender inside her mouth and signed. Symptoms are moderate, constant, worsening.  Past Medical History  Diagnosis Date  . Anxiety   . Depression   . Bipolar disorder     no current med.  . Irritable bowel syndrome (IBS)     no current med.  [name]. Ulnar fracture 03/09/2014    left  . Scalp laceration     staples to be removed 03/13/2014  . History of MRSA infection 2010  . Apnea, sleep     no CPAP use, states lost machine during move to [place]   Past Surgical History  Procedure Laterality Date  . Ovarian cyst removal    . Tubal ligation    . Thyroid cyst excision      nodule exc.  [name]. Cyst excision      middle of back  . Orif ulnar fracture Left 03/16/2014    Procedure: OPEN REDUCTION INTERNAL FIXATION (ORIF) LEFT ULNAR FRACTURE;  Surgeon: Marlowe ShoresMatthew A Weingold, MD;  Location: [facility];  Service: Orthopedics;  Laterality: Left;  left   Family History  Problem Relation Age of Onset  .  Heart disease Mother   . Cancer Maternal Grandmother     breast  . Cancer Paternal Grandmother     breast   History  Substance Use Topics  . Smoking status: Current Every Day Smoker -- 3 years    Types: Cigarettes  . Smokeless tobacco: Never Used     Comment: 1-2 cig./day  . Alcohol Use: No   OB History    Gravida Para Term Preterm AB TAB SAB Ectopic Multiple Living   4 4 4       4      Review of Systems  Gastrointestinal: Positive for abdominal pain.  All other systems reviewed and are negative.     Allergies  Sulfa antibiotics; Toradol; Flagyl; Naproxen; Bactrim; and Tramadol  Home Medications   Prior to Admission medications   Medication Sig Start Date End Date Taking? Authorizing Provider  albuterol (PROVENTIL HFA;VENTOLIN HFA) 108 (90 BASE) MCG/ACT inhaler Inhale 2 puffs into the lungs every 4 (four) hours as needed for wheezing or shortness of breath. 11/16/14   Harle BattiestElizabeth Tysinger, NP  Alum & Mag Hydroxide-Simeth (MAGIC MOUTHWASH W/LIDOCAINE) SOLN Take 5 mLs by mouth 4 (four) times daily as needed for mouth pain. Patient not taking: Reported on 12/06/2014 11/16/14   Harle BattiestElizabeth Tysinger, NP  ciprofloxacin (CIPRO) 250 MG  tablet Take 1 tablet (250 mg total) by mouth every 12 (twelve) hours. Patient not taking: Reported on 10/30/2014 09/08/14   Marny Lowenstein, PA-C  clindamycin (CLEOCIN) 300 MG capsule Take 1 capsule (300 mg total) by mouth 2 (two) times daily. 12/08/14   Brock Bad, MD  doxycycline (VIBRAMYCIN) 100 MG capsule Take 1 capsule (100 mg total) by mouth 2 (two) times daily. Patient not taking: Reported on 12/06/2014 10/30/14   Jamesetta Orleans Lawyer, PA-C  HYDROcodone-acetaminophen (NORCO/VICODIN) 5-325 MG per tablet Take 1 tablet by mouth every 6 (six) hours as needed for moderate pain. Patient not taking: Reported on 12/06/2014 10/30/14   Jamesetta Orleans Lawyer, PA-C  ibuprofen (ADVIL,MOTRIN) 600 MG tablet take 1 tablet by mouth every 6 hours if needed for  pain Patient not taking: Reported on 12/06/2014 12/02/14   Deirdre C Poe, CNM  ibuprofen (ADVIL,MOTRIN) 800 MG tablet Take 800 mg by mouth every 8 (eight) hours as needed for headache or moderate pain.    Historical Provider, MD  oxyCODONE-acetaminophen (ROXICET) 5-325 MG per tablet Take 1-2 tablets by mouth every 4 (four) hours as needed for severe pain. Patient not taking: Reported on 10/30/2014 09/08/14   Marny Lowenstein, PA-C  Phenylephrine-DM-GG-APAP (MUCINEX FAST-MAX) 5-10-200-325 MG CAPS Take 2 capsules by mouth 2 (two) times daily. Patient not taking: Reported on 12/06/2014 11/16/14   Harle Battiest, NP  predniSONE (DELTASONE) 20 MG tablet Take 2 tablets (40 mg total) by mouth daily. Patient not taking: Reported on 12/06/2014 11/16/14   Harle Battiest, NP   BP 124/82 mmHg  Pulse 86  Temp(Src) 98.2 F (36.8 C) (Oral)  Resp 18  SpO2 98%  LMP 01/10/2015 Physical Exam  Constitutional: She is oriented to person, place, and time. She appears well-developed and well-nourished.  HENT:  Head: Normocephalic and atraumatic.  There is moderate swelling of the left lateral upper lip with multiple vesicles, some of which have ruptured and are leaking a serous fluid. The mucosal surface of the lip also has scattered vesicles.  Cardiovascular: Normal rate and regular rhythm.   No murmur heard. Pulmonary/Chest: Effort normal and breath sounds normal. No respiratory distress.  Abdominal: Soft. Bowel sounds are normal. There is no rebound and no guarding.  Mild central abdominal tenderness without guarding or rebound tenderness.  Genitourinary:  Scant white discharge, mild CMT and cervical friability.  No adnexal tenderness.  Musculoskeletal: She exhibits no edema or tenderness.  Neurological: She is alert and oriented to person, place, and time.  Skin: Skin is warm and dry.  Psychiatric: She has a normal mood and affect. Her behavior is normal.  Nursing note and vitals reviewed.   ED Course   Procedures (including critical care time) Labs Review Labs Reviewed  WET PREP, GENITAL - Abnormal; Notable for the following:    Clue Cells Wet Prep HPF POC FEW (*)    WBC, Wet Prep HPF POC FEW (*)    All other components within normal limits  COMPREHENSIVE METABOLIC PANEL - Abnormal; Notable for the following:    Glucose, Bld 105 (*)    Calcium 8.2 (*)    Total Protein 5.4 (*)    Albumin 3.1 (*)    Anion gap 1 (*)    All other components within normal limits  CBC WITH DIFFERENTIAL/PLATELET - Abnormal; Notable for the following:    MCV 77.5 (*)    Neutrophils Relative % 41 (*)    All other components within normal limits  URINALYSIS, ROUTINE W REFLEX  MICROSCOPIC - Abnormal; Notable for the following:    Color, Urine AMBER (*)    APPearance CLOUDY (*)    Specific Gravity, Urine 1.037 (*)    Hgb urine dipstick SMALL (*)    Bilirubin Urine SMALL (*)    Ketones, ur 15 (*)    All other components within normal limits  URINE MICROSCOPIC-ADD ON - Abnormal; Notable for the following:    Crystals CA OXALATE CRYSTALS (*)    All other components within normal limits  URINE CULTURE  RPR  HIV ANTIBODY (ROUTINE TESTING)  POC URINE PREG, ED  GC/CHLAMYDIA PROBE AMP ([place])    Imaging Review No results found.   EKG Interpretation None      MDM   Final diagnoses:  HSV-1 (herpes simplex virus 1) infection  Bacterial vaginosis    Patient here with vaginal discharge and lip swelling. In terms of vaginal discharge, wet prep is consistent with bacterial vaginosis. Patient does have some mild CMT, will treat for BV as well as PID. Presentation is not consistent with tubo-ovarian abscess or severe PID. History of presentation is not consistent with acute appendicitis or cholecystitis. UA is not consistent with urinary tract infection. Patient has a Flagyl allergy, clindamycin prescribed for her BV. Discussed with patient to abstain from intercourse until treated and need for recheck  for clearance of illness. Discussed with patient importance of oral fluid hydration as well as return precautions for abdominal pain. Lip exam is consistent with HSV-1 outbreak. Patient does not have history of HSV-1 previously. There is no evidence of abscess or cellulitis. Discussed with patient home care and transmission of illness.      Tilden Fossa, MD 01/17/15 2040

## 2015-01-18 LAB — URINE CULTURE
Colony Count: NO GROWTH
Culture: NO GROWTH

## 2015-01-18 LAB — RPR: RPR Ser Ql: NONREACTIVE

## 2015-01-18 LAB — GC/CHLAMYDIA PROBE AMP (~~LOC~~) NOT AT ARMC
Chlamydia: NEGATIVE
Neisseria Gonorrhea: NEGATIVE

## 2015-01-19 ENCOUNTER — Telehealth (HOSPITAL_BASED_OUTPATIENT_CLINIC_OR_DEPARTMENT_OTHER): Payer: Self-pay | Admitting: Emergency Medicine

## 2015-01-19 LAB — HIV ANTIBODY (ROUTINE TESTING W REFLEX): HIV Screen 4th Generation wRfx: NONREACTIVE

## 2015-01-19 NOTE — Telephone Encounter (Signed)
[facility] pt had been there attempting to get vicodin filled when pharmacist noted that pt is chronically on oxycodone, when pharmacist wanted to get clarificationpt "ripped the rx out of my hand"

## 2015-01-25 ENCOUNTER — Encounter (HOSPITAL_COMMUNITY): Payer: Self-pay | Admitting: *Deleted

## 2015-01-25 ENCOUNTER — Emergency Department (HOSPITAL_COMMUNITY)
Admission: EM | Admit: 2015-01-25 | Discharge: 2015-01-25 | Disposition: A | Discharge status: Home / Self Care | Payer: Medicaid Other | Attending: Emergency Medicine | Admitting: Emergency Medicine

## 2015-01-25 DIAGNOSIS — Z8619 Personal history of other infectious and parasitic diseases: Secondary | ICD-10-CM | POA: Diagnosis not present

## 2015-01-25 DIAGNOSIS — Z8719 Personal history of other diseases of the digestive system: Secondary | ICD-10-CM | POA: Diagnosis not present

## 2015-01-25 DIAGNOSIS — Z760 Encounter for issue of repeat prescription: Secondary | ICD-10-CM | POA: Diagnosis not present

## 2015-01-25 DIAGNOSIS — F419 Anxiety disorder, unspecified: Secondary | ICD-10-CM | POA: Insufficient documentation

## 2015-01-25 DIAGNOSIS — Z79899 Other long term (current) drug therapy: Secondary | ICD-10-CM | POA: Insufficient documentation

## 2015-01-25 DIAGNOSIS — Z8669 Personal history of other diseases of the nervous system and sense organs: Secondary | ICD-10-CM | POA: Insufficient documentation

## 2015-01-25 DIAGNOSIS — Z87828 Personal history of other (healed) physical injury and trauma: Secondary | ICD-10-CM | POA: Diagnosis not present

## 2015-01-25 DIAGNOSIS — Z792 Long term (current) use of antibiotics: Secondary | ICD-10-CM | POA: Insufficient documentation

## 2015-01-25 DIAGNOSIS — F3131 Bipolar disorder, current episode depressed, mild: Secondary | ICD-10-CM | POA: Insufficient documentation

## 2015-01-25 DIAGNOSIS — Z8781 Personal history of (healed) traumatic fracture: Secondary | ICD-10-CM | POA: Insufficient documentation

## 2015-01-25 DIAGNOSIS — Z87891 Personal history of nicotine dependence: Secondary | ICD-10-CM | POA: Insufficient documentation

## 2015-01-25 MED ORDER — SEROQUEL XR 300 MG PO TB24: 300.0000 mg | ORAL_TABLET | Freq: Every day | ORAL | Status: DC

## 2015-01-25 MED ORDER — ALPRAZOLAM 0.5 MG PO TABS: 0.5000 mg | ORAL_TABLET | Freq: Two times a day (BID) | ORAL | Status: DC | PRN

## 2015-01-25 MED ORDER — PAROXETINE HCL 20 MG PO TABS: 20.0000 mg | ORAL_TABLET | Freq: Every day | ORAL | Status: DC

## 2015-01-25 MED ORDER — ADDERALL XR 20 MG PO CP24: 20.0000 mg | ORAL_CAPSULE | Freq: Every day | ORAL | Status: DC

## 2015-01-25 NOTE — Discharge Instructions (Signed)
Medication Refill, Emergency Department °We have refilled your medication today as a courtesy to you. It is best for your medical care, however, to take care of getting refills done through your primary caregiver's office. They have your records and can do a better job of follow-up than we can in the emergency department. °On maintenance medications, we often only prescribe enough medications to get you by until you are able to see your regular caregiver. This is a more expensive way to refill medications. °In the future, please plan for refills so that you will not have to use the emergency department for this. °Thank you for your help. Your help allows us to better take care of the daily emergencies that enter our department. °Document Released: 02/16/2004 Document Revised: 01/22/2012 Document Reviewed: 02/06/2014 °ExitCare® Patient Information ©2015 [employer]. This information is not intended to replace advice given to you by your health care provider. Make sure you discuss any questions you have with your health care provider. ° °

## 2015-01-25 NOTE — ED Notes (Signed)
PT states [facility]  Could not get her in till May.  She needs medication refills seroquel, paxil, aderall.  Pt states not able to sleep.

## 2015-01-25 NOTE — ED Notes (Signed)
[facility]. Pt reports meds ran out 01-20-15.

## 2015-01-25 NOTE — ED Provider Notes (Signed)
CSN: [id number]     Arrival date & time 01/25/15  1349 History  This chart was scribed for non-physician practitioner, Marlon Pel, working with Rolan Bucco, MD by Richarda Overlie, ED Scribe. This patient was seen in room TR11C/TR11C and the patient's care was started at 4:29 PM.    Chief Complaint  Patient presents with  . Medication Refill   The history is provided by the patient. No language interpreter was used.   HPI Comments: Leah Barry is a 29 y.o. female with a history of IBS, anxiety and bipolar disorder who presents to the Emergency Department for medication refills of xanax, seroquel, paxil and aderall . Pt states that she missed her appointment at [facility] because she was at the hospital due to abscess of the lip. She says that she ran out of her adderall medication 01/20/15. Pt states she has been out of her xanax and paxil prescriptions for the last couple of weeks. She says that she has not been able to sleep well recently due to her medication changes. She reports no alleviating or modifying factors at this time.    Past Medical History  Diagnosis Date  . Anxiety   . Depression   . Bipolar disorder     no current med.  . Irritable bowel syndrome (IBS)     no current med.  [name] Ulnar fracture 03/09/2014    left  . Scalp laceration     staples to be removed 03/13/2014  . History of MRSA infection 2010  . Apnea, sleep     no CPAP use, states lost machine during move to [place]   Past Surgical History  Procedure Laterality Date  . Ovarian cyst removal    . Tubal ligation    . Thyroid cyst excision      nodule exc.  [name] Cyst excision      middle of back  . Orif ulnar fracture Left 03/16/2014    Procedure: OPEN REDUCTION INTERNAL FIXATION (ORIF) LEFT ULNAR FRACTURE;  Surgeon: Marlowe Shores, MD;  Location: [facility];  Service: Orthopedics;  Laterality: Left;  left   Family History  Problem Relation Age of Onset  . Heart disease Mother   . Cancer  Maternal Grandmother     breast  . Cancer Paternal Grandmother     breast   History  Substance Use Topics  . Smoking status: Former Smoker -- 3 years    Types: Cigarettes  . Smokeless tobacco: Never Used     Comment: 1-2 cig./day  . Alcohol Use: No   OB History    Gravida Para Term Preterm AB TAB SAB Ectopic Multiple Living   Review of Systems  Psychiatric/Behavioral: Negative for suicidal ideas. The patient is nervous/anxious.   All other systems reviewed and are negative.     Allergies  Sulfa antibiotics; Toradol; Flagyl; Naproxen; Bactrim; and Tramadol  Home Medications   Prior to Admission medications   Medication Sig Start Date End Date Taking? Authorizing Provider  acyclovir (ZOVIRAX) 400 MG tablet Take 1 tablet (400 mg total) by mouth 3 (three) times daily. 01/17/15   Tilden Fossa, MD  ADDERALL XR 20 MG 24 hr capsule Take 1 capsule (20 mg total) by mouth daily. 01/25/15   Ashyr Hedgepath Neva Seat, PA-C  albuterol (PROVENTIL HFA;VENTOLIN HFA) 108 (90 BASE) MCG/ACT inhaler Inhale 2 puffs into the lungs every 4 (four) hours as needed for wheezing  or shortness of breath. 11/16/14   Harle Battiest, NP  ALPRAZolam Prudy Feeler) 0.5 MG tablet Take 1 tablet (0.5 mg total) by mouth 2 (two) times daily as needed for anxiety. 01/25/15   Jahnay Lantier Neva Seat, PA-C  clindamycin (CLEOCIN) 150 MG capsule Take 2 capsules (300 mg total) by mouth 2 (two) times daily. 01/17/15   Tilden Fossa, MD  doxycycline (VIBRAMYCIN) 100 MG capsule Take 1 capsule (100 mg total) by mouth 2 (two) times daily. 01/17/15   Tilden Fossa, MD  HYDROcodone-acetaminophen (NORCO/VICODIN) 5-325 MG per tablet Take 1 tablet by mouth every 6 (six) hours as needed. 01/17/15   Tilden Fossa, MD  ibuprofen (ADVIL,MOTRIN) 600 MG tablet take 1 tablet by mouth every 6 hours if needed for pain 12/02/14   Deirdre C Poe, CNM  PARoxetine (PAXIL) 20 MG tablet Take 1 tablet (20 mg total) by mouth daily. 01/25/15   Alia Parsley Neva Seat, PA-C   promethazine (PHENERGAN) 25 MG tablet Take 1 tablet (25 mg total) by mouth every 6 (six) hours as needed for nausea or vomiting. 01/17/15   Tilden Fossa, MD  SEROQUEL XR 300 MG 24 hr tablet Take 1 tablet (300 mg total) by mouth daily. 01/25/15   Orpheus Hayhurst Neva Seat, PA-C   BP 131/87 mmHg  Pulse 82  Temp(Src) 97.7 F (36.5 C) (Oral)  Resp 18  Wt 173 lb 4 oz (78.586 kg)  SpO2 100%  LMP 01/10/2015 Physical Exam  Constitutional: She is oriented to person, place, and time. She appears well-developed and well-nourished.  HENT:  Head: Normocephalic and atraumatic.  Eyes: Right eye exhibits no discharge. Left eye exhibits no discharge.  Neck: Neck supple. No tracheal deviation present.  Cardiovascular: Normal rate.   Pulmonary/Chest: Effort normal. No respiratory distress.  Abdominal: She exhibits no distension.  Neurological: She is alert and oriented to person, place, and time.  Skin: Skin is warm and dry.  Psychiatric: She has a normal mood and affect. Her behavior is normal. She expresses no homicidal and no suicidal ideation. She expresses no suicidal plans and no homicidal plans.  Anxious. Rapid speech.   Nursing note and vitals reviewed.   ED Course  Procedures   DIAGNOSTIC STUDIES: Oxygen Saturation is 100% on RA, normal by my interpretation.    COORDINATION OF CARE: 4:34 PM Discussed treatment plan with pt at bedside and pt agreed to plan. Discussed patient's medication management will refill chronic medications based on recent pharmacy prescriptions.    Labs Review Labs Reviewed - No data to display  Imaging Review No results found.   EKG Interpretation None      MDM   Final diagnoses:  Medication refill   Discussed with patient that the ER is not the appropriate place for refills. I do not see that the patient has many visits in the ED and she has documented that she takes these medications per pharmacy. Will write for 30 days worth. Pt needs to follow-up with  [facility]. No SI/HI pt has been out of her medications and seems very anxious. Normal vital signs.  28 y.o.Higinio Plan Kellen's evaluation in the Emergency Department is complete. It has been determined that no acute conditions requiring further emergency intervention are present at this time. The patient/guardian have been advised of the diagnosis and plan. We have discussed signs and symptoms that warrant return to the ED, such as changes or worsening in symptoms.  Vital signs are stable at discharge. Filed Vitals:   01/25/15 1434  BP: 131/87  Pulse: 82  Temp:  97.7 F (36.5 C)  Resp: 18    Patient/guardian has voiced understanding and agreed to follow-up with the PCP or specialist.  I personally performed the services described in this documentation, which was scribed in my presence. The recorded information has been reviewed and is accurate.     Marlon Peliffany Atisha Hamidi, PA-C 01/25/15 1641  Rolan BuccoMelanie Belfi, MD 01/25/15 [phone]

## 2015-01-27 ENCOUNTER — Encounter: Payer: Self-pay | Admitting: Obstetrics

## 2015-01-27 ENCOUNTER — Ambulatory Visit (INDEPENDENT_AMBULATORY_CARE_PROVIDER_SITE_OTHER): Payer: Medicaid Other | Admitting: Obstetrics

## 2015-01-27 VITALS — BP 122/71 | HR 80 | Temp 98.7°F | Ht 67.0 in | Wt 174.0 lb

## 2015-01-27 DIAGNOSIS — N644 Mastodynia: Secondary | ICD-10-CM | POA: Diagnosis not present

## 2015-01-27 DIAGNOSIS — N760 Acute vaginitis: Secondary | ICD-10-CM | POA: Diagnosis not present

## 2015-01-27 DIAGNOSIS — A499 Bacterial infection, unspecified: Secondary | ICD-10-CM

## 2015-01-27 DIAGNOSIS — B9689 Other specified bacterial agents as the cause of diseases classified elsewhere: Secondary | ICD-10-CM

## 2015-01-27 MED ORDER — METRONIDAZOLE 0.75 % VA GEL: 1.0000 | Freq: Two times a day (BID) | VAGINAL | Status: DC

## 2015-01-27 NOTE — Progress Notes (Signed)
Patient ID: Leah Barry, female   DOB: [birth year], 29 y.o.   MRN: [id number]  Chief Complaint  Patient presents with  . Breast Problem    HPI Leah Barry is a 29 y.o. female.  Woke up this morning with severe pain in right breast.  Denies trauma to breast.  HPI  Past Medical History  Diagnosis Date  . Anxiety   . Depression   . Bipolar disorder     no current med.  . Irritable bowel syndrome (IBS)     no current med.  [name] Ulnar fracture 03/09/2014    left  . Scalp laceration     staples to be removed 03/13/2014  . History of MRSA infection 2010  . Apnea, sleep     no CPAP use, states lost machine during move to [place]    Past Surgical History  Procedure Laterality Date  . Ovarian cyst removal    . Tubal ligation    . Thyroid cyst excision      nodule exc.  [name] Cyst excision      middle of back  . Orif ulnar fracture Left 03/16/2014    Procedure: OPEN REDUCTION INTERNAL FIXATION (ORIF) LEFT ULNAR FRACTURE;  Surgeon: Marlowe Shores, MD;  Location: [facility];  Service: Orthopedics;  Laterality: Left;  left    Family History  Problem Relation Age of Onset  . Heart disease Mother   . Cancer Maternal Grandmother     breast  . Cancer Paternal Grandmother     breast    Social History History  Substance Use Topics  . Smoking status: Former Smoker -- 3 years    Types: Cigarettes  . Smokeless tobacco: Never Used     Comment: 1-2 cig./day  . Alcohol Use: No    Allergies  Allergen Reactions  . Sulfa Antibiotics Swelling    SWELLING OF EYES  . Toradol [Ketorolac Tromethamine] Itching  . Flagyl [Metronidazole] Nausea And Vomiting  . Naproxen Other (See Comments)    NOSE BLEED  . Bactrim [Sulfamethoxazole-Trimethoprim]     Eyes swell  . Tramadol     Nausea     Current Outpatient Prescriptions  Medication Sig Dispense Refill  . acyclovir (ZOVIRAX) 400 MG tablet Take 1 tablet (400 mg total) by mouth 3 (three) times daily. 30 tablet 0  .  ADDERALL XR 20 MG 24 hr capsule Take 1 capsule (20 mg total) by mouth daily. 30 capsule 0  . ALPRAZolam (XANAX) 0.5 MG tablet Take 1 tablet (0.5 mg total) by mouth 2 (two) times daily as needed for anxiety. 14 tablet 0  . ibuprofen (ADVIL,MOTRIN) 600 MG tablet take 1 tablet by mouth every 6 hours if needed for pain 30 tablet 1  . PARoxetine (PAXIL) 20 MG tablet Take 1 tablet (20 mg total) by mouth daily. 30 tablet 0  . SEROQUEL XR 300 MG 24 hr tablet Take 1 tablet (300 mg total) by mouth daily. 30 tablet 0  . metroNIDAZOLE (METROGEL VAGINAL) 0.75 % vaginal gel Place 1 Applicatorful vaginally 2 (two) times daily. 70 g 2   No current facility-administered medications for this visit.    Review of Systems Review of Systems Constitutional: negative for fatigue and weight loss Respiratory: negative for cough and wheezing Cardiovascular: negative for chest pain, fatigue and palpitations Gastrointestinal: negative for abdominal pain and change in bowel habits Genitourinary:negative Integument/breast: positive  for nipple discharge, pain Musculoskeletal:negative for myalgias Neurological: negative for gait problems and tremors Behavioral/Psych:  negative for abusive relationship, depression Endocrine: negative for temperature intolerance     Blood pressure 122/71, pulse 80, temperature 98.7 F (37.1 C), height 5\' 7"  (1.702 m), weight 174 lb (78.926 kg), last menstrual period 01/10/2015.  Physical Exam Physical Exam:      Breast:  Right breast tender to palpation 9 o'clock.  No masses or erythema.    Data Reviewed Labs  Assessment     Mastodynia, right breast. BV     Plan  Metro Gel Rx for BV [facility]erred to [facility]. F/U in 2 weeks.   Orders Placed This Encounter  Procedures  . MM DIAG BREAST TOMO BILATERAL    Rt breast pain Cosign req 01/28/15 Pf: none  No needs  Mh/barb    Medicaid      Standing Status: Future     Number of Occurrences:      Standing  Expiration Date: 03/29/2016    Order Specific Question:  Reason for Exam (SYMPTOM  OR DIAGNOSIS REQUIRED)    Answer:  Breast pain    Order Specific Question:  Is the patient pregnant?    Answer:  No    Order Specific Question:  Preferred imaging location?    [facility]   Meds ordered this encounter  Medications  . metroNIDAZOLE (METROGEL VAGINAL) 0.75 % vaginal gel    Sig: Place 1 Applicatorful vaginally 2 (two) times daily.    Dispense:  70 g    Refill:  2

## 2015-01-28 ENCOUNTER — Telehealth: Payer: Self-pay

## 2015-01-28 ENCOUNTER — Other Ambulatory Visit: Payer: Self-pay | Admitting: Obstetrics

## 2015-01-28 ENCOUNTER — Encounter: Payer: Self-pay | Admitting: Obstetrics

## 2015-01-28 DIAGNOSIS — N644 Mastodynia: Secondary | ICD-10-CM

## 2015-01-28 NOTE — Telephone Encounter (Signed)
Spoke with patient regarding appt at breast center, and she said she would call back - told her it was 02/02/15 at 9am - she interupted and said she wanted to call us - will call again if I dont hear back

## 2015-01-28 NOTE — Telephone Encounter (Signed)
gave patient appt date and time for mammogram on 02/02/15 at [facility]9am

## 2015-02-02 ENCOUNTER — Other Ambulatory Visit: Payer: Medicaid Other

## 2015-02-10 ENCOUNTER — Ambulatory Visit: Payer: Medicaid Other | Admitting: Obstetrics

## 2015-02-17 ENCOUNTER — Encounter (HOSPITAL_COMMUNITY): Payer: Self-pay | Admitting: *Deleted

## 2015-02-17 ENCOUNTER — Emergency Department (HOSPITAL_COMMUNITY)
Admission: EM | Admit: 2015-02-17 | Discharge: 2015-02-17 | Disposition: A | Discharge status: Home / Self Care | Payer: Medicaid Other | Attending: Emergency Medicine | Admitting: Emergency Medicine

## 2015-02-17 DIAGNOSIS — K029 Dental caries, unspecified: Secondary | ICD-10-CM | POA: Insufficient documentation

## 2015-02-17 DIAGNOSIS — K088 Other specified disorders of teeth and supporting structures: Secondary | ICD-10-CM | POA: Insufficient documentation

## 2015-02-17 DIAGNOSIS — W260XXA Contact with knife, initial encounter: Secondary | ICD-10-CM | POA: Diagnosis not present

## 2015-02-17 DIAGNOSIS — Z87891 Personal history of nicotine dependence: Secondary | ICD-10-CM | POA: Insufficient documentation

## 2015-02-17 DIAGNOSIS — S61211A Laceration without foreign body of left index finger without damage to nail, initial encounter: Secondary | ICD-10-CM | POA: Diagnosis not present

## 2015-02-17 DIAGNOSIS — Z8669 Personal history of other diseases of the nervous system and sense organs: Secondary | ICD-10-CM | POA: Diagnosis not present

## 2015-02-17 DIAGNOSIS — F419 Anxiety disorder, unspecified: Secondary | ICD-10-CM | POA: Diagnosis not present

## 2015-02-17 DIAGNOSIS — Y9289 Other specified places as the place of occurrence of the external cause: Secondary | ICD-10-CM | POA: Insufficient documentation

## 2015-02-17 DIAGNOSIS — Z23 Encounter for immunization: Secondary | ICD-10-CM | POA: Insufficient documentation

## 2015-02-17 DIAGNOSIS — S61213A Laceration without foreign body of left middle finger without damage to nail, initial encounter: Secondary | ICD-10-CM | POA: Diagnosis not present

## 2015-02-17 DIAGNOSIS — Z8614 Personal history of Methicillin resistant Staphylococcus aureus infection: Secondary | ICD-10-CM | POA: Diagnosis not present

## 2015-02-17 DIAGNOSIS — F319 Bipolar disorder, unspecified: Secondary | ICD-10-CM | POA: Diagnosis not present

## 2015-02-17 DIAGNOSIS — Y9389 Activity, other specified: Secondary | ICD-10-CM | POA: Diagnosis not present

## 2015-02-17 DIAGNOSIS — K0889 Other specified disorders of teeth and supporting structures: Secondary | ICD-10-CM

## 2015-02-17 DIAGNOSIS — Y998 Other external cause status: Secondary | ICD-10-CM | POA: Insufficient documentation

## 2015-02-17 DIAGNOSIS — S65501A Unspecified injury of blood vessel of left index finger, initial encounter: Secondary | ICD-10-CM | POA: Diagnosis present

## 2015-02-17 DIAGNOSIS — S61219A Laceration without foreign body of unspecified finger without damage to nail, initial encounter: Secondary | ICD-10-CM

## 2015-02-17 MED ORDER — IBUPROFEN 400 MG PO TABS
600.0000 mg | ORAL_TABLET | Freq: Once | ORAL | Status: AC
  Administered 2015-02-17: 600 mg via ORAL
  Filled 2015-02-17: qty 2

## 2015-02-17 MED ORDER — TETANUS-DIPHTH-ACELL PERTUSSIS 5-2.5-18.5 LF-MCG/0.5 IM SUSP
0.5000 mL | Freq: Once | INTRAMUSCULAR | Status: AC
  Administered 2015-02-17: 0.5 mL via INTRAMUSCULAR
  Filled 2015-02-17: qty 0.5

## 2015-02-17 NOTE — ED Notes (Signed)
The pt cut her lt index and middle fingers on a knife last night while cutting green peppers.  Flap-type lac to the lt index finger swollen and painful.  lmp  Last month

## 2015-02-17 NOTE — ED Notes (Signed)
Pt sts she took Ibuprofen (500mg  x 3, last night), Aleve (500mg  x 2 at 11a today).    Pt would also like her numb tongue and gums looked at.  Sts she noticed red spots on her tongue with swelling a few days ago, and her gums and tongue on the right side have been numb ever since.

## 2015-02-17 NOTE — ED Notes (Signed)
Fingers/lacerations rinsed with wound spray and dried blood removed with saline and gauze.  Bacitracin applied to wounds and covered with non-adherent cover and gauze wrap.

## 2015-02-17 NOTE — ED Provider Notes (Signed)
CSN: [id number]     Arrival date & time 02/17/15  1657 History  This chart was scribed for Leah Bleacher, PA-C, working with Tilden Fossa, MD by Chestine Spore, ED Scribe. The patient was seen in room TR07C/TR07C at 5:32 PM.    Chief Complaint  Patient presents with  . Laceration     The history is provided by the patient. No language interpreter was used.    HPI Comments: EVADEAN Barry is a 29 y.o. female with no significant medical hx who presents to the Emergency Department complaining of laceration onset last night at 8-9 PM. Pt cut her left index and middle finger on a knife last night while she was cutting green peppers. Pt was holding the green pepper while she was cutting it. She states that she has tried gauze and Rx strength OTC Ibuprofen with no relief for her symptoms. Pt notes that she used the gauze and placed it inside the actual laceration to stop the bleeding. Pt removed the gauze from the wound this morning. She denies fever and any other symptoms. Pt has not had a tetanus shot in 10 years and she was using an old knife.  Pt also has right sided lower dental pain onset 3 days. Pt reports that she has a broken tooth to the area. Pt notes that her tongue was swelling at first and she noticed red spots on her tongue. Pt has had two teeth pulled from that side of her mouth. Pt denies facial swelling, neck pain and any other symptoms.   Due to [place] controlled substance reporting database, pt received #90 Tramadol on the 02/03/15 and #90 Oxycodone on 02/09/15.   Past Medical History  Diagnosis Date  . Anxiety   . Depression   . Bipolar disorder     no current med.  . Irritable bowel syndrome (IBS)     no current med.  [name] Ulnar fracture 03/09/2014    left  . Scalp laceration     staples to be removed 03/13/2014  . History of MRSA infection 2010  . Apnea, sleep     no CPAP use, states lost machine during move to [place]   Past Surgical History  Procedure Laterality Date  . Ovarian  cyst removal    . Tubal ligation    . Thyroid cyst excision      nodule exc.  [name] Cyst excision      middle of back  . Orif ulnar fracture Left 03/16/2014    Procedure: OPEN REDUCTION INTERNAL FIXATION (ORIF) LEFT ULNAR FRACTURE;  Surgeon: Marlowe Shores, MD;  Location: [facility];  Service: Orthopedics;  Laterality: Left;  left   Family History  Problem Relation Age of Onset  . Heart disease Mother   . Cancer Maternal Grandmother     breast  . Cancer Paternal Grandmother     breast   History  Substance Use Topics  . Smoking status: Former Smoker -- 3 years    Types: Cigarettes  . Smokeless tobacco: Never Used     Comment: 1-2 cig./day  . Alcohol Use: No   OB History    Gravida Para Term Preterm AB TAB SAB Ectopic Multiple Living   Review of Systems  Constitutional: Negative for fever, chills and activity change.  HENT: Positive for dental problem. Negative for ear pain, facial swelling, sore throat and trouble swallowing.   Respiratory: Negative  for shortness of breath and stridor.   Musculoskeletal: Positive for myalgias and joint swelling. Negative for back pain and neck pain.  Skin: Positive for wound. Negative for color change.  Neurological: Negative for weakness, numbness and headaches.      Allergies  Sulfa antibiotics; Toradol; Flagyl; Naproxen; Bactrim; and Tramadol  Home Medications   Prior to Admission medications   Medication Sig Start Date End Date Taking? Authorizing Provider  acyclovir (ZOVIRAX) 400 MG tablet Take 1 tablet (400 mg total) by mouth 3 (three) times daily. 01/17/15   Tilden Fossa, MD  ADDERALL XR 20 MG 24 hr capsule Take 1 capsule (20 mg total) by mouth daily. 01/25/15   Tiffany Neva Seat, PA-C  ALPRAZolam Prudy Feeler) 0.5 MG tablet Take 1 tablet (0.5 mg total) by mouth 2 (two) times daily as needed for anxiety. 01/25/15   Tiffany Neva Seat, PA-C  ibuprofen (ADVIL,MOTRIN) 600 MG tablet take 1 tablet by mouth every 6  hours if needed for pain 12/02/14   Deirdre C Poe, CNM  metroNIDAZOLE (METROGEL VAGINAL) 0.75 % vaginal gel Place 1 Applicatorful vaginally 2 (two) times daily. 01/27/15   Brock Bad, MD  PARoxetine (PAXIL) 20 MG tablet Take 1 tablet (20 mg total) by mouth daily. 01/25/15   Tiffany Neva Seat, PA-C  SEROQUEL XR 300 MG 24 hr tablet Take 1 tablet (300 mg total) by mouth daily. 01/25/15   Tiffany Neva Seat, PA-C   BP 124/77 mmHg  Pulse 75  Temp(Src) 98.4 F (36.9 C)  Resp 18  Ht  (1.702 m)  Wt 167 lb (75.751 kg)  BMI 26.15 kg/m2  SpO2 98%  LMP 01/17/2015  Physical Exam  Constitutional: She is oriented to person, place, and time. She appears well-developed and well-nourished. No distress.  HENT:  Head: Normocephalic and atraumatic.  Right Ear: Tympanic membrane, external ear and ear canal normal.  Left Ear: Tympanic membrane, external ear and ear canal normal.  Nose: Nose normal.  Mouth/Throat: Uvula is midline, oropharynx is clear and moist and mucous membranes are normal. No trismus in the jaw. Abnormal dentition. Dental caries present. No dental abscesses or uvula swelling. No tonsillar abscesses.  Patient with R mandibular tooth pain and tenderness to palpation in area of posterior molar. This tooth is mostly broken to gumline. No swelling or erythema noted on exam. No gross abscess.   Eyes: Conjunctivae and EOM are normal. Pupils are equal, round, and reactive to light.  Neck: Normal range of motion. Neck supple. No tracheal deviation present.  No neck swelling or Ludwig's angina  Cardiovascular: Normal rate.  Exam reveals no decreased pulses.   Pulmonary/Chest: Effort normal. No respiratory distress.  Musculoskeletal: Normal range of motion. She exhibits tenderness. She exhibits no edema.  Patient with c-shaped flap laceration noted to the volar aspect of the long and ring digits between the PIP and DIP joints. The wounds are superficial. There is dried blood in the wounds. No  surrounding cellulitis. Negative Kan[place] signs.   Lymphadenopathy:    She has no cervical adenopathy.  Neurological: She is alert and oriented to person, place, and time. No sensory deficit.  Patients with 5 out of 5 strength resistance of the affected fingers in flexion. Normal extension. Limited active flexion due to pain.  Skin: Skin is warm and dry.  Psychiatric: She has a normal mood and affect. Her behavior is normal.  Nursing note and vitals reviewed.   ED Course  Procedures (including critical care time) DIAGNOSTIC STUDIES: Oxygen Saturation is 98% on  RA, nl by my interpretation.    COORDINATION OF CARE: 5:38 PM-Discussed treatment plan which includes tetanus injection, wash the wound 3 times a day, f/u if the symptoms worsen with pt at bedside and pt agreed to plan.   Labs Review Labs Reviewed - No data to display  Imaging Review No results found.   EKG Interpretation None       Tetanus ordered. Ibuprofen ordered. Patient counseled on wound care. Patient was urged to return to the Emergency Department urgently with worsening pain, swelling, expanding erythema especially if it streaks away from the affected area, fever, or if they have any other concerns. Patient verbalized understanding.   Vital signs reviewed and are as follows: Filed Vitals:   02/17/15 1704  BP: 124/77  Pulse: 75  Temp: 98.4 F (36.9 C)  Resp: 18    Patient counseled to take home medications as directed, return with worsening facial or neck swelling, and to follow-up with their dentist as soon as possible.    MDM   Final diagnoses:  Laceration of finger, initial encounter  Pain, dental   Patient with flap lacerations to digits as above not amenable to repair. Do not suspect underlying flexor tendon injury. No signs of infection currently. No immunocompromise. Patient counseled on wound care.  Patient with toothache. No fever. Exam unconcerning for Ludwig's angina or other deep tissue  infection in neck.   As there is no facial swelling or gum findings, will not prescribe antibiotics at this time. Will treat with pain medication.     I personally performed the services described in this documentation, which was scribed in my presence. The recorded information has been reviewed and is accurate.    Renne CriglerJoshua Kapono Luhn, PA-C 02/17/15 1805  Tilden FossaElizabeth Rees, MD 02/17/15 [phone]

## 2015-02-17 NOTE — Discharge Instructions (Signed)
Please read and follow all provided instructions.  Your diagnoses today include:  1. Laceration of finger, initial encounter   2. Pain, dental    Tests performed today include:  Vital signs. See below for your results today.   Medications prescribed:   None  Take any prescribed medications only as directed.   Home care instructions:  Follow any educational materials and wound care instructions contained in this packet.   Keep affected area above the level of your heart when possible to minimize swelling. Wash area gently twice a day with warm soapy water. Do not apply alcohol or hydrogen peroxide. Cover the area if it draining or weeping.   Follow-up instructions:  Return instructions:  Return to the Emergency Department if you have:  Fever  Worsening pain  Worsening swelling of the wound  Pus draining from the wound  Redness of the skin that moves away from the wound, especially if it streaks away from the affected area   Any other emergent concerns  Your vital signs today were: BP 124/77 mmHg   Pulse 75   Temp(Src) 98.4 F (36.9 C)   Resp 18   Ht 5\' 7"  (1.702 m)   Wt 167 lb (75.751 kg)   BMI 26.15 kg/m2   SpO2 98%   LMP 01/17/2015 If your blood pressure (BP) was elevated above 135/85 this visit, please have this repeated by your doctor within one month. --------------

## 2015-03-02 ENCOUNTER — Emergency Department (HOSPITAL_COMMUNITY)
Admission: EM | Admit: 2015-03-02 | Discharge: 2015-03-02 | Disposition: A | Discharge status: Home / Self Care | Payer: Medicaid Other | Attending: Emergency Medicine | Admitting: Emergency Medicine

## 2015-03-02 ENCOUNTER — Encounter (HOSPITAL_COMMUNITY): Payer: Self-pay | Admitting: *Deleted

## 2015-03-02 DIAGNOSIS — F319 Bipolar disorder, unspecified: Secondary | ICD-10-CM | POA: Insufficient documentation

## 2015-03-02 DIAGNOSIS — F419 Anxiety disorder, unspecified: Secondary | ICD-10-CM | POA: Insufficient documentation

## 2015-03-02 DIAGNOSIS — Z8719 Personal history of other diseases of the digestive system: Secondary | ICD-10-CM | POA: Diagnosis not present

## 2015-03-02 DIAGNOSIS — Z8669 Personal history of other diseases of the nervous system and sense organs: Secondary | ICD-10-CM | POA: Diagnosis not present

## 2015-03-02 DIAGNOSIS — Z79899 Other long term (current) drug therapy: Secondary | ICD-10-CM | POA: Diagnosis not present

## 2015-03-02 DIAGNOSIS — G43809 Other migraine, not intractable, without status migrainosus: Secondary | ICD-10-CM | POA: Diagnosis not present

## 2015-03-02 DIAGNOSIS — Z87891 Personal history of nicotine dependence: Secondary | ICD-10-CM | POA: Insufficient documentation

## 2015-03-02 DIAGNOSIS — Z8614 Personal history of Methicillin resistant Staphylococcus aureus infection: Secondary | ICD-10-CM | POA: Diagnosis not present

## 2015-03-02 DIAGNOSIS — Z87828 Personal history of other (healed) physical injury and trauma: Secondary | ICD-10-CM | POA: Insufficient documentation

## 2015-03-02 DIAGNOSIS — R51 Headache: Secondary | ICD-10-CM | POA: Diagnosis present

## 2015-03-02 MED ORDER — METOCLOPRAMIDE HCL 5 MG/ML IJ SOLN
10.0000 mg | Freq: Once | INTRAMUSCULAR | Status: AC
Start: 2015-03-02 — End: 2015-03-02
  Administered 2015-03-02: 10 mg via INTRAVENOUS
  Filled 2015-03-02: qty 2

## 2015-03-02 MED ORDER — SODIUM CHLORIDE 0.9 % IV BOLUS (SEPSIS)
1000.0000 mL | Freq: Once | INTRAVENOUS | Status: AC
  Administered 2015-03-02: 1000 mL via INTRAVENOUS

## 2015-03-02 MED ORDER — ADDERALL XR 20 MG PO CP24: 20.0000 mg | ORAL_CAPSULE | Freq: Every day | ORAL | Status: DC

## 2015-03-02 MED ORDER — SEROQUEL XR 300 MG PO TB24: 300.0000 mg | ORAL_TABLET | Freq: Every day | ORAL | Status: DC

## 2015-03-02 MED ORDER — DIPHENHYDRAMINE HCL 50 MG/ML IJ SOLN
25.0000 mg | Freq: Once | INTRAMUSCULAR | Status: AC
  Administered 2015-03-02: 25 mg via INTRAVENOUS
  Filled 2015-03-02: qty 1
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
## 2015-03-02 NOTE — Discharge Instructions (Signed)

## 2015-03-30 ENCOUNTER — Telehealth: Payer: Self-pay | Admitting: *Deleted

## 2015-03-30 NOTE — Telephone Encounter (Signed)
Patient contacted the office requesting to speak with a nurse. Attempted to contact the patient and left message for patient to call the office.

## 2015-04-05 ENCOUNTER — Telehealth: Payer: Self-pay | Admitting: *Deleted

## 2015-04-05 ENCOUNTER — Other Ambulatory Visit: Payer: Self-pay | Admitting: Obstetrics

## 2015-04-05 DIAGNOSIS — R52 Pain, unspecified: Secondary | ICD-10-CM

## 2015-04-05 DIAGNOSIS — N760 Acute vaginitis: Principal | ICD-10-CM

## 2015-04-05 DIAGNOSIS — B9689 Other specified bacterial agents as the cause of diseases classified elsewhere: Secondary | ICD-10-CM

## 2015-04-05 MED ORDER — METRONIDAZOLE 0.75 % VA GEL: 1.0000 | Freq: Two times a day (BID) | VAGINAL | Status: DC

## 2015-04-05 MED ORDER — TRAMADOL HCL 50 MG PO TABS: 50.0000 mg | ORAL_TABLET | Freq: Four times a day (QID) | ORAL | Status: DC | PRN

## 2015-04-05 NOTE — Telephone Encounter (Signed)
Patient missed her appointment for her breast pain and needs to reschedule- she would like a refill of her medication. She is also having symptoms of BV- odor and discharge and would like treatment. Per Dr Clearance CootsHarper - patient will have to come to office to pick up Rx.

## 2015-04-05 NOTE — Telephone Encounter (Signed)
Request RF on Medication 2:33 LM on VM to CB

## 2015-04-14 NOTE — Telephone Encounter (Signed)
error 

## 2015-05-02 ENCOUNTER — Emergency Department (HOSPITAL_COMMUNITY): Payer: Medicaid Other

## 2015-05-02 ENCOUNTER — Emergency Department (HOSPITAL_COMMUNITY)
Admission: EM | Admit: 2015-05-02 | Discharge: 2015-05-02 | Disposition: A | Discharge status: Home / Self Care | Payer: Medicaid Other | Attending: Emergency Medicine | Admitting: Emergency Medicine

## 2015-05-02 ENCOUNTER — Encounter (HOSPITAL_COMMUNITY): Payer: Self-pay | Admitting: Nurse Practitioner

## 2015-05-02 DIAGNOSIS — Z87891 Personal history of nicotine dependence: Secondary | ICD-10-CM | POA: Insufficient documentation

## 2015-05-02 DIAGNOSIS — N83209 Unspecified ovarian cyst, unspecified side: Secondary | ICD-10-CM

## 2015-05-02 DIAGNOSIS — Z8719 Personal history of other diseases of the digestive system: Secondary | ICD-10-CM | POA: Insufficient documentation

## 2015-05-02 DIAGNOSIS — R109 Unspecified abdominal pain: Secondary | ICD-10-CM | POA: Diagnosis present

## 2015-05-02 DIAGNOSIS — N739 Female pelvic inflammatory disease, unspecified: Secondary | ICD-10-CM | POA: Insufficient documentation

## 2015-05-02 DIAGNOSIS — F329 Major depressive disorder, single episode, unspecified: Secondary | ICD-10-CM | POA: Insufficient documentation

## 2015-05-02 DIAGNOSIS — N730 Acute parametritis and pelvic cellulitis: Secondary | ICD-10-CM

## 2015-05-02 DIAGNOSIS — Z8614 Personal history of Methicillin resistant Staphylococcus aureus infection: Secondary | ICD-10-CM | POA: Diagnosis not present

## 2015-05-02 DIAGNOSIS — N760 Acute vaginitis: Secondary | ICD-10-CM | POA: Insufficient documentation

## 2015-05-02 DIAGNOSIS — Z87828 Personal history of other (healed) physical injury and trauma: Secondary | ICD-10-CM | POA: Diagnosis not present

## 2015-05-02 DIAGNOSIS — Z3202 Encounter for pregnancy test, result negative: Secondary | ICD-10-CM | POA: Insufficient documentation

## 2015-05-02 DIAGNOSIS — Z8781 Personal history of (healed) traumatic fracture: Secondary | ICD-10-CM | POA: Insufficient documentation

## 2015-05-02 DIAGNOSIS — F419 Anxiety disorder, unspecified: Secondary | ICD-10-CM | POA: Diagnosis not present

## 2015-05-02 DIAGNOSIS — B9689 Other specified bacterial agents as the cause of diseases classified elsewhere: Secondary | ICD-10-CM

## 2015-05-02 DIAGNOSIS — N838 Other noninflammatory disorders of ovary, fallopian tube and broad ligament: Secondary | ICD-10-CM | POA: Diagnosis not present

## 2015-05-02 DIAGNOSIS — Z8669 Personal history of other diseases of the nervous system and sense organs: Secondary | ICD-10-CM | POA: Insufficient documentation

## 2015-05-02 HISTORY — DX: Female pelvic inflammatory disease, unspecified: N73.9

## 2015-05-02 LAB — URINALYSIS, ROUTINE W REFLEX MICROSCOPIC
Bilirubin Urine: NEGATIVE
Glucose, UA: NEGATIVE mg/dL
Hgb urine dipstick: NEGATIVE
Ketones, ur: NEGATIVE mg/dL
Leukocytes, UA: NEGATIVE
Nitrite: NEGATIVE
Protein, ur: NEGATIVE mg/dL
Specific Gravity, Urine: 1.031 — ABNORMAL HIGH (ref 1.005–1.030)
Urobilinogen, UA: 0.2 mg/dL (ref 0.0–1.0)
pH: 6 (ref 5.0–8.0)

## 2015-05-02 LAB — CBC WITH DIFFERENTIAL/PLATELET
Basophils Absolute: 0 10*3/uL (ref 0.0–0.1)
Basophils Relative: 0 % (ref 0–1)
Eosinophils Absolute: 0.2 10*3/uL (ref 0.0–0.7)
Eosinophils Relative: 3 % (ref 0–5)
HCT: 40.5 % (ref 36.0–46.0)
Hemoglobin: 14.3 g/dL (ref 12.0–15.0)
Lymphocytes Relative: 39 % (ref 12–46)
Lymphs Abs: 3 10*3/uL (ref 0.7–4.0)
MCH: 26.8 pg (ref 26.0–34.0)
MCHC: 35.3 g/dL (ref 30.0–36.0)
MCV: 75.8 fL — ABNORMAL LOW (ref 78.0–100.0)
Monocytes Absolute: 0.6 10*3/uL (ref 0.1–1.0)
Monocytes Relative: 8 % (ref 3–12)
Neutro Abs: 3.9 10*3/uL (ref 1.7–7.7)
Neutrophils Relative %: 50 % (ref 43–77)
Platelets: 244 10*3/uL (ref 150–400)
RBC: 5.34 MIL/uL — ABNORMAL HIGH (ref 3.87–5.11)
RDW: 13.4 % (ref 11.5–15.5)
WBC: 7.7 10*3/uL (ref 4.0–10.5)

## 2015-05-02 LAB — COMPREHENSIVE METABOLIC PANEL
ALT: 13 U/L — ABNORMAL LOW (ref 14–54)
AST: 13 U/L — ABNORMAL LOW (ref 15–41)
Albumin: 3.5 g/dL (ref 3.5–5.0)
Alkaline Phosphatase: 56 U/L (ref 38–126)
Anion gap: 6 (ref 5–15)
BUN: 8 mg/dL (ref 6–20)
CO2: 27 mmol/L (ref 22–32)
Calcium: 8.4 mg/dL — ABNORMAL LOW (ref 8.9–10.3)
Chloride: 105 mmol/L (ref 101–111)
Creatinine, Ser: 0.62 mg/dL (ref 0.44–1.00)
GFR calc Af Amer: >60 mL/min (ref >60)
GFR calc non Af Amer: >60 mL/min (ref >60)
Glucose, Bld: 99 mg/dL (ref 65–99)
Potassium: 3.8 mmol/L (ref 3.5–5.1)
Sodium: 138 mmol/L (ref 135–145)
Total Bilirubin: 0.3 mg/dL (ref 0.3–1.2)
Total Protein: 6.3 g/dL — ABNORMAL LOW (ref 6.5–8.1)

## 2015-05-02 LAB — WET PREP, GENITAL
Trich, Wet Prep: NONE SEEN
Yeast Wet Prep HPF POC: NONE SEEN

## 2015-05-02 LAB — POC URINE PREG, ED: Preg Test, Ur: NEGATIVE

## 2015-05-02 LAB — I-STAT CG4 LACTIC ACID, ED: Lactic Acid, Venous: 1.1 mmol/L (ref 0.5–2.0)

## 2015-05-02 MED ORDER — HYDROCODONE-ACETAMINOPHEN 5-325 MG PO TABS
1.0000 | ORAL_TABLET | Freq: Four times a day (QID) | ORAL | Status: DC | PRN
Start: 2015-05-02 — End: 2015-06-08

## 2015-05-02 MED ORDER — DIPHENHYDRAMINE HCL 50 MG/ML IJ SOLN
25.0000 mg | Freq: Once | INTRAMUSCULAR | Status: AC
  Administered 2015-05-02: 25 mg via INTRAVENOUS
  Filled 2015-05-02: qty 1

## 2015-05-02 MED ORDER — METRONIDAZOLE 500 MG PO TABS: 500.0000 mg | ORAL_TABLET | Freq: Two times a day (BID) | ORAL | Status: DC

## 2015-05-02 MED ORDER — SODIUM CHLORIDE 0.9 % IV BOLUS (SEPSIS): 1000.0000 mL | Freq: Once | INTRAVENOUS | Status: DC

## 2015-05-02 MED ORDER — DOXYCYCLINE HYCLATE 100 MG PO CAPS: 100.0000 mg | ORAL_CAPSULE | Freq: Two times a day (BID) | ORAL | Status: DC

## 2015-05-02 MED ORDER — IOHEXOL 300 MG/ML  SOLN
100.0000 mL | Freq: Once | INTRAMUSCULAR | Status: AC | PRN
  Administered 2015-05-02: 100 mL via INTRAVENOUS

## 2015-05-02 MED ORDER — FENTANYL CITRATE (PF) 100 MCG/2ML IJ SOLN
100.0000 ug | Freq: Once | INTRAMUSCULAR | Status: AC
  Administered 2015-05-02: 100 ug via INTRAVENOUS
  Filled 2015-05-02: qty 2

## 2015-05-02 MED ORDER — DEXTROSE 5 % IV SOLN
1.0000 g | Freq: Once | INTRAVENOUS | Status: AC
  Administered 2015-05-02: 1 g via INTRAVENOUS
  Filled 2015-05-02: qty 10

## 2015-05-02 NOTE — ED Notes (Signed)
Patient returned from CT

## 2015-05-02 NOTE — ED Notes (Signed)
Pt requested to speak to nursing supervisor-Charge RN Tenna Delaine informed

## 2015-05-02 NOTE — ED Notes (Signed)
Itching resolved after benadryl.

## 2015-05-02 NOTE — ED Provider Notes (Signed)
29 year old female signed out to me at shift change by Ebbie Ridge PAC, pending wet prep. Patient's wet prep returned with clue cells, due to patient's presentation she'll be treated with metronidazole for bacterial vaginosis. Results from today's visit were discussed with the patient, she understood treatment of BV and requested we continue the metronidazole even with her previous nausea with taking metronidazole. Patient was in no acute distress at the time of discharge. Please see previous provider's note for full H&P.  Eyvonne Mechanic, PA-C 05/03/15 3762  Gilda Crease, MD 05/03/15 [phone]

## 2015-05-02 NOTE — ED Notes (Signed)
C/o abd and pelvic pain, fishy smelling vaginal discharge, dsyuria, n/v x 1 week. She applied topical metronidazole and took oral pain meds with no relief.

## 2015-05-02 NOTE — ED Notes (Signed)
Patient transported to CT 

## 2015-05-02 NOTE — ED Provider Notes (Signed)
CSN: [id number]     Arrival date & time 05/02/15  1339 History   First MD Initiated Contact with Patient 05/02/15 1449     Chief Complaint  Patient presents with  . Abdominal Pain     (Consider location/radiation/quality/duration/timing/severity/associated sxs/prior Treatment) HPI Patient presents to the emergency department with abdominal pain, vaginal discharge.  It has a foul odor for the last week.  The patient, states she has had a little bit of nausea and some dysuria.  She states that she applied topical metronidazole in the vaginal region with no relief of her symptoms.  The patient states she has not had any fever, weakness, dizziness, headache, blurred vision, back pain, neck pain, chest pain, shortness of breath, incontinence, bloody stool, vaginal bleeding or syncope.  The patient states that nothing seems make her condition better or worse.  The patient states she has more diffuse abdominal pain.  She is complaining of some right-sided abdominal pain as well Past Medical History  Diagnosis Date  . Anxiety   . Depression   . Bipolar disorder     no current med.  . Irritable bowel syndrome (IBS)     no current med.  [name] Ulnar fracture 03/09/2014    left  . Scalp laceration     staples to be removed 03/13/2014  . History of MRSA infection 2010  . Apnea, sleep     no CPAP use, states lost machine during move to [place]  . Pelvic inflammatory disease    Past Surgical History  Procedure Laterality Date  . Ovarian cyst removal    . Tubal ligation    . Thyroid cyst excision      nodule exc.  [name] Cyst excision      middle of back  . Orif ulnar fracture Left 03/16/2014    Procedure: OPEN REDUCTION INTERNAL FIXATION (ORIF) LEFT ULNAR FRACTURE;  Surgeon: Marlowe Shores, MD;  Location: [facility];  Service: Orthopedics;  Laterality: Left;  left   Family History  Problem Relation Age of Onset  . Heart disease Mother   . Cancer Maternal Grandmother     breast  . Cancer  Paternal Grandmother     breast   History  Substance Use Topics  . Smoking status: Former Smoker -- 3 years    Types: Cigarettes  . Smokeless tobacco: Never Used     Comment: 1-2 cig./day  . Alcohol Use: No   OB History    Gravida Para Term Preterm AB TAB SAB Ectopic Multiple Living   Review of Systems  All other systems negative except as documented in the HPI. All pertinent positives and negatives as reviewed in the HPI.  Allergies  Sulfa antibiotics; Toradol; Bee venom; Flagyl; Naproxen; and Bactrim  Home Medications   Prior to Admission medications   Medication Sig Start Date End Date Taking? Authorizing Provider  acetaminophen (TYLENOL) 500 MG tablet Take 500 mg by mouth every 6 (six) hours as needed for mild pain.   Yes Historical Provider, MD  ALPRAZolam Prudy Feeler) 1 MG tablet Take 1 mg by mouth daily as needed for anxiety.  02/02/15  Yes Historical Provider, MD  oxyCODONE-acetaminophen (PERCOCET/ROXICET) 5-325 MG per tablet Take 1 tablet by mouth daily as needed for moderate pain or severe pain.  02/09/15  Yes Historical Provider, MD   BP 118/80 mmHg  Pulse 82  Temp(Src) 98.1 F (36.7 C) (Oral)  Resp 18  SpO2 100%  LMP 03/14/2015 Physical Exam  Constitutional: She is oriented to person, place, and time. She appears well-developed and well-nourished. No distress.  HENT:  Head: Normocephalic and atraumatic.  Mouth/Throat: Oropharynx is clear and moist.  Eyes: Pupils are equal, round, and reactive to light.  Neck: Normal range of motion. Neck supple.  Cardiovascular: Normal rate, regular rhythm and normal heart sounds.  Exam reveals no gallop and no friction rub.   No murmur heard. Pulmonary/Chest: Effort normal and breath sounds normal. No respiratory distress.  Abdominal: Soft. Bowel sounds are normal. She exhibits no distension. There is tenderness. There is no rebound and no guarding.  Genitourinary: Cervix exhibits discharge. Cervix exhibits no  motion tenderness and no friability. Right adnexum displays tenderness. Right adnexum displays no mass and no fullness. Left adnexum displays tenderness. Left adnexum displays no mass and no fullness. No tenderness in the vagina. No signs of injury around the vagina. Vaginal discharge found.  Neurological: She is alert and oriented to person, place, and time. She exhibits normal muscle tone. Coordination normal.  Skin: Skin is warm and dry. No rash noted. No erythema.  Nursing note and vitals reviewed.   ED Course  Procedures (including critical care time) Labs Review Labs Reviewed  COMPREHENSIVE METABOLIC PANEL - Abnormal; Notable for the following:    Calcium 8.4 (*)    Total Protein 6.3 (*)    AST 13 (*)    ALT 13 (*)    All other components within normal limits  CBC WITH DIFFERENTIAL/PLATELET - Abnormal; Notable for the following:    RBC 5.34 (*)    MCV 75.8 (*)    All other components within normal limits  URINALYSIS, ROUTINE W REFLEX MICROSCOPIC (NOT AT [facility])  POC URINE PREG, ED    the patient most likely has PID and will be treated for this along with a ruptured ovarian cyst.[facility] Imaging Review Ct Abdomen Pelvis W Contrast  05/02/2015   CLINICAL DATA:  Worsening right abdominal pain for the past week. Some vomiting.  EXAM: CT ABDOMEN AND PELVIS WITH CONTRAST  TECHNIQUE: Multidetector CT imaging of the abdomen and pelvis was performed using the standard protocol following bolus administration of intravenous contrast.  CONTRAST:  OMNIPAQUE IOHEXOL 300 MG/ML  SOLN  COMPARISON:  12/16/2013. Pelvic ultrasound dated 10/30/2014. Abdomen ultrasound dated 07/05/2014.  FINDINGS: Unremarkable liver, spleen, pancreas, gallbladder, adrenal glands, kidneys, ureters and urinary bladder. Normal appearing uterus and left ovary. 2.3 cm right ovarian cyst with a thin enhancing rim. Small amount of free peritoneal fluid in the pelvic cul-de-sac.   No gastrointestinal abnormalities or enlarged lymph nodes. Normal appearing retrocecal appendix. Clear lung bases. Minimal lower thoracic spine degenerative changes.  IMPRESSION: 1. No acute abnormality. 2. Partially collapsed right ovarian follicular cyst with a small amount of associated free peritoneal fluid.  Note: The patient experienced itching on her back, neck and abdomen following the examination. On physical examination, she had no hives or skin redness. She had no difficulty breathing. She was returned to the emergency department for IV Benadryl administration if felt clinically indicated by Dr. Otila Kluver.   Electronically Signed   By: Beckie Salts M.D.   On: 05/02/2015 16:47        Charlestine Night, PA-C 05/02/15 1720  Blane Ohara, MD 05/04/15 2124

## 2015-05-02 NOTE — ED Notes (Addendum)
Charge RN in to speak with pt.  Pt upset and states she has been here 7 hours and had to ask for graham crackers and then had to wait 2 hours for them.  States she had unnecessary test performed.  Also states MD told her 30 min ago that her discharge paperwork was ready.  Pt has been here for 5 1/2 hours with c/o abd pain.  Staff had informed pt that she could not eat until her results came back due to abd pain.  Attempted to explain to pt why she couldn't eat until results came back.  Apologized to pt for delay in discharge and explained that RN had been in with another pt.  Pt states she will be here first thing in the morning to speak with director.

## 2015-05-02 NOTE — ED Notes (Signed)
Returns from CT itching. There is no rash present on body, no respiratory involvement. Her nose is itching the most; appears to be side effect of Fentanyl that was given immediately prior to transport to CT.

## 2015-05-02 NOTE — Discharge Instructions (Signed)
Return here as needed.  Follow-up with the clinic provided.  °

## 2015-05-03 LAB — GC/CHLAMYDIA PROBE AMP (~~LOC~~) NOT AT ARMC
Chlamydia: NEGATIVE
Neisseria Gonorrhea: NEGATIVE

## 2015-05-04 NOTE — Care Management (Signed)
ED CM spoke with Charge Nurse Efraim Kaufmann regarding a phone call she received from patient and  Geologist, engineering at [facility] questioning a prescription.  CM reviewed record, and received phone call from Zena at [facility].concerning patient's previous prescription for percocet's she filled approximately 30 days ago. Pharmacist informed me that patient is in Medicaid lockout program whereby patient  control substance prescription can only be written by a the physician that medicaid has on file for prescribing her control substance. As per pharmacist patient unwilling to listen to explanation of why the prescription could not be filled. ED CM made aware in the event the patient should contact the ED concerning this matter.

## 2015-05-07 ENCOUNTER — Inpatient Hospital Stay (HOSPITAL_COMMUNITY)
Admission: AD | Admit: 2015-05-07 | Discharge: 2015-05-07 | Disposition: A | Discharge status: Home / Self Care | Payer: Medicaid Other | Source: Ambulatory Visit | Attending: Obstetrics | Admitting: Obstetrics

## 2015-05-07 ENCOUNTER — Encounter (HOSPITAL_COMMUNITY): Payer: Self-pay | Admitting: *Deleted

## 2015-05-07 DIAGNOSIS — Z87891 Personal history of nicotine dependence: Secondary | ICD-10-CM | POA: Diagnosis not present

## 2015-05-07 DIAGNOSIS — R109 Unspecified abdominal pain: Secondary | ICD-10-CM | POA: Diagnosis present

## 2015-05-07 DIAGNOSIS — Z8614 Personal history of Methicillin resistant Staphylococcus aureus infection: Secondary | ICD-10-CM | POA: Insufficient documentation

## 2015-05-07 DIAGNOSIS — B9689 Other specified bacterial agents as the cause of diseases classified elsewhere: Secondary | ICD-10-CM

## 2015-05-07 DIAGNOSIS — R1031 Right lower quadrant pain: Secondary | ICD-10-CM

## 2015-05-07 DIAGNOSIS — N760 Acute vaginitis: Secondary | ICD-10-CM | POA: Diagnosis not present

## 2015-05-07 LAB — URINALYSIS, ROUTINE W REFLEX MICROSCOPIC
Bilirubin Urine: NEGATIVE
Glucose, UA: NEGATIVE mg/dL
Ketones, ur: NEGATIVE mg/dL
Leukocytes, UA: NEGATIVE
Nitrite: NEGATIVE
Protein, ur: NEGATIVE mg/dL
Specific Gravity, Urine: >1.03 — ABNORMAL HIGH (ref 1.005–1.030)
Urobilinogen, UA: 0.2 mg/dL (ref 0.0–1.0)
pH: 5.5 (ref 5.0–8.0)

## 2015-05-07 LAB — CBC WITH DIFFERENTIAL/PLATELET
Basophils Absolute: 0 10*3/uL (ref 0.0–0.1)
Basophils Relative: 0 % (ref 0–1)
Eosinophils Absolute: 0.4 10*3/uL (ref 0.0–0.7)
Eosinophils Relative: 6 % — ABNORMAL HIGH (ref 0–5)
HCT: 35.7 % — ABNORMAL LOW (ref 36.0–46.0)
Hemoglobin: 12.7 g/dL (ref 12.0–15.0)
Lymphocytes Relative: 40 % (ref 12–46)
Lymphs Abs: 2.8 10*3/uL (ref 0.7–4.0)
MCH: 27.1 pg (ref 26.0–34.0)
MCHC: 35.6 g/dL (ref 30.0–36.0)
MCV: 76.3 fL — ABNORMAL LOW (ref 78.0–100.0)
Monocytes Absolute: 0.7 10*3/uL (ref 0.1–1.0)
Monocytes Relative: 10 % (ref 3–12)
Neutro Abs: 3.1 10*3/uL (ref 1.7–7.7)
Neutrophils Relative %: 44 % (ref 43–77)
Platelets: 215 10*3/uL (ref 150–400)
RBC: 4.68 MIL/uL (ref 3.87–5.11)
RDW: 13.5 % (ref 11.5–15.5)
WBC: 7 10*3/uL (ref 4.0–10.5)

## 2015-05-07 LAB — WET PREP, GENITAL
Trich, Wet Prep: NONE SEEN
Yeast Wet Prep HPF POC: NONE SEEN

## 2015-05-07 LAB — URINE MICROSCOPIC-ADD ON

## 2015-05-07 LAB — POCT PREGNANCY, URINE: Preg Test, Ur: NEGATIVE

## 2015-05-07 MED ORDER — METRONIDAZOLE 0.75 % VA GEL
1.0000 | Freq: Two times a day (BID) | VAGINAL | Status: DC
Start: 2015-05-07 — End: 2015-06-08

## 2015-05-07 NOTE — MAU Provider Note (Signed)
History     CSN: [id number]  Arrival date and time: 05/07/15 1618   None     Chief Complaint  Patient presents with  . Abdominal Pain  . Dysuria   HPI Leah Barry is 29 y.o. [phone] presenting with lower right abdominal and right flank pain.  She was seen at [facility] ED on 6/19 dx with PID.  She is taking Doxycycline but was unable to take Flagyl bc of nausea.  Has Metrogel at home used today.  Dr. Clearance Coots has given her Metrogel in past and she doesn't have any more.   Wet prep was Neg for trich, GC/CHL cultures were neg.  CT of Abdomen showed no acute abnormality; collapsing follicular cyst of the right.  She rates pain at its worst 9/10 and 8/10.  She took 2 Percocet tabs at 1pm with little releif of pain.  Nothing makes it worse or better.   Is now having some dark vaginal discharge with odor, began today.  Last intercourse yesterday.  BTL for contraception.   Last BM yesterday, loose.      Past Medical History  Diagnosis Date  . Anxiety   . Depression   . Bipolar disorder     no current med.  . Irritable bowel syndrome (IBS)     no current med.  [name] Ulnar fracture 03/09/2014    left  . Scalp laceration     staples to be removed 03/13/2014  . History of MRSA infection 2010  . Apnea, sleep     no CPAP use, states lost machine during move to [place]  . Pelvic inflammatory disease     Past Surgical History  Procedure Laterality Date  . Ovarian cyst removal    . Tubal ligation    . Thyroid cyst excision      nodule exc.  [name] Cyst excision      middle of back  . Orif ulnar fracture Left 03/16/2014    Procedure: OPEN REDUCTION INTERNAL FIXATION (ORIF) LEFT ULNAR FRACTURE;  Surgeon: Marlowe Shores, MD;  Location: [facility];  Service: Orthopedics;  Laterality: Left;  left    Family History  Problem Relation Age of Onset  . Heart disease Mother   . Cancer Maternal Grandmother     breast  . Cancer Paternal Grandmother     breast    History  Substance Use  Topics  . Smoking status: Former Smoker -- 3 years    Types: Cigarettes  . Smokeless tobacco: Never Used     Comment: 1-2 cig./day  . Alcohol Use: No    Allergies:  Allergies  Allergen Reactions  . Contrast Media [Iodinated Diagnostic Agents] Itching    Pt developed itching after contrast administration; patient had also received Fentanyl immediately prior to transport to CT. The itching was without any rash, it was concentrated in the face/nose, and relieved with Benadryl. Possible that itching a side effect of pain medication rather than contrast.  . Sulfa Antibiotics Swelling    SWELLING OF EYES  . Toradol [Ketorolac Tromethamine] Itching  . Bee Venom Swelling    "Bees make my eyes swell."  . Flagyl [Metronidazole] Nausea And Vomiting  . Naproxen Other (See Comments)    NOSE BLEED  . Bactrim [Sulfamethoxazole-Trimethoprim]     Eyes swell    Prescriptions prior to admission  Medication Sig Dispense Refill Last Dose  . ALPRAZolam (XANAX) 1 MG tablet Take 1 mg by mouth daily as needed for anxiety.  3 05/06/2015 at Unknown time  . doxycycline (VIBRAMYCIN) 100 MG capsule Take 1 capsule (100 mg total) by mouth 2 (two) times daily. 28 capsule 0 05/07/2015 at Unknown time  . metroNIDAZOLE (METROGEL) 0.75 % vaginal gel Place 1 application vaginally 2 (two) times daily.  2 05/06/2015 at Unknown time  . oxyCODONE-acetaminophen (PERCOCET/ROXICET) 5-325 MG per tablet Take 1 tablet by mouth daily as needed for moderate pain or severe pain.   0 05/07/2015 at Unknown time  . acetaminophen (TYLENOL) 500 MG tablet Take 500 mg by mouth every 6 (six) hours as needed for mild pain.   prn at prn  . HYDROcodone-acetaminophen (NORCO/VICODIN) 5-325 MG per tablet Take 1 tablet by mouth every 6 (six) hours as needed for moderate pain. (Patient not taking: Reported on 05/07/2015) 15 tablet 0 Not Taking at Unknown time  . metroNIDAZOLE (FLAGYL) 500 MG tablet Take 1 tablet (500 mg total) by mouth 2 (two) times  daily. (Patient not taking: Reported on 05/07/2015) 14 tablet 0 Not Taking at Unknown time    Review of Systems  Constitutional: Negative for fever and chills.  Gastrointestinal: Positive for nausea and abdominal pain (lower right sided and right flank pain). Negative for vomiting.  Genitourinary: Negative for dysuria, urgency and frequency.       Vaginal discharge-brown with odor  Neurological: Negative for headaches.   Physical Exam   Blood pressure 121/72, pulse 92, temperature 98.5 F (36.9 C), temperature source Oral, resp. rate 18, last menstrual period 03/14/2015.  Physical Exam  Vitals reviewed. Constitutional: She is oriented to person, place, and time. She appears well-developed and well-nourished. No distress.  HENT:  Head: Normocephalic.  Neck: Normal range of motion.  Cardiovascular: Normal rate.   Respiratory: Effort normal.  GI: Soft. She exhibits no distension and no mass. There is tenderness (mild lower right sided pain, without rebound). There is no rebound and no guarding.  Genitourinary: Uterus normal. There is no rash, tenderness or lesion on the right labia. There is no rash, tenderness or lesion on the left labia. Uterus is not deviated, not enlarged, not fixed and not tender. Cervix exhibits no motion tenderness. Right adnexum displays tenderness (mild right tenderness). Right adnexum displays no mass and no fullness. Left adnexum displays no mass, no tenderness and no fullness. No erythema, tenderness or bleeding in the vagina. Vaginal discharge (light pink discharge with odor) found.  Neurological: She is alert and oriented to person, place, and time.  Skin: Skin is warm and dry.  Psychiatric: She has a normal mood and affect. Her behavior is normal.    Results for orders placed or performed during the hospital encounter of 05/07/15 (from the past 24 hour(s))  Urinalysis, Routine w reflex microscopic (not at [facility])     Status: Abnormal   Collection Time:  05/07/15  4:32 PM  Result Value Ref Range   Color, Urine YELLOW YELLOW   APPearance CLEAR CLEAR   Specific Gravity, Urine >1.030 (H) 1.005 - 1.030   pH 5.5 5.0 - 8.0   Glucose, UA NEGATIVE NEGATIVE mg/dL   Hgb urine dipstick TRACE (A) NEGATIVE   Bilirubin Urine NEGATIVE NEGATIVE   Ketones, ur NEGATIVE NEGATIVE mg/dL   Protein, ur NEGATIVE NEGATIVE mg/dL   Urobilinogen, UA 0.2 0.0 - 1.0 mg/dL   Nitrite NEGATIVE NEGATIVE   Leukocytes, UA NEGATIVE NEGATIVE  Urine microscopic-add on     Status: Abnormal   Collection Time: 05/07/15  4:32 PM  Result Value Ref Range   Squamous  Epithelial / LPF RARE RARE   RBC / HPF 0-2 <3 RBC/hpf   Bacteria, UA MANY (A) RARE   Urine-Other MUCOUS PRESENT   Pregnancy, urine POC     Status: None   Collection Time: 05/07/15  5:14 PM  Result Value Ref Range   Preg Test, Ur NEGATIVE NEGATIVE  CBC with Differential/Platelet     Status: Abnormal   Collection Time: 05/07/15  6:05 PM  Result Value Ref Range   WBC 7.0 4.0 - 10.5 K/uL   RBC 4.68 3.87 - 5.11 MIL/uL   Hemoglobin 12.7 12.0 - 15.0 g/dL   HCT 96.0 (L) 45.4 - 09.8 %   MCV 76.3 (L) 78.0 - 100.0 fL   MCH 27.1 26.0 - 34.0 pg   MCHC 35.6 30.0 - 36.0 g/dL   RDW 11.9 14.7 - 82.9 %   Platelets 215 150 - 400 K/uL   Neutrophils Relative % 44 43 - 77 %   Neutro Abs 3.1 1.7 - 7.7 K/uL   Lymphocytes Relative 40 12 - 46 %   Lymphs Abs 2.8 0.7 - 4.0 K/uL   Monocytes Relative 10 3 - 12 %   Monocytes Absolute 0.7 0.1 - 1.0 K/uL   Eosinophils Relative 6 (H) 0 - 5 %   Eosinophils Absolute 0.4 0.0 - 0.7 K/uL   Basophils Relative 0 0 - 1 %   Basophils Absolute 0.0 0.0 - 0.1 K/uL  Wet prep, genital     Status: Abnormal   Collection Time: 05/07/15  6:20 PM  Result Value Ref Range   Yeast Wet Prep HPF POC NONE SEEN NONE SEEN   Trich, Wet Prep NONE SEEN NONE SEEN   Clue Cells Wet Prep HPF POC MODERATE (A) NONE SEEN   WBC, Wet Prep HPF POC FEW (A) NONE SEEN   MAU Course  Procedures  GC/CHL cultures not  repeated--Neg/Neg last week  MDM Exam Lab Unable to give Toradol due to allergies listed After exam, patient was asking to eat.  Waited to labs back, she thought pain was due to hunger--better after eating crackers and sprite. Assessment and Plan  A:  Abdominal Pain      Hx of collapsing right ovarian cyst-last week       Bacterial vaginosis  P:  Rx for Metrogel to pharmacy      Discussed nature of ovarian cysts      May continue Percocet as needed     Follow up with Dr. Clearance Coots.   KEY,EVE M 05/07/2015, 7:01 PM

## 2015-05-07 NOTE — Discharge Instructions (Signed)
Abdominal Pain, Women °Abdominal (stomach, pelvic, or belly) pain can be caused by many things. It is important to tell your doctor: °· The location of the pain. °· Does it come and go or is it present all the time? °· Are there things that start the pain (eating certain foods, exercise)? °· Are there other symptoms associated with the pain (fever, nausea, vomiting, diarrhea)? °All of this is helpful to know when trying to find the cause of the pain. °CAUSES  °· Stomach: virus or bacteria infection, or ulcer. °· Intestine: appendicitis (inflamed appendix), regional ileitis (Crohn's disease), ulcerative colitis (inflamed colon), irritable bowel syndrome, diverticulitis (inflamed diverticulum of the colon), or cancer of the stomach or intestine. °· Gallbladder disease or stones in the gallbladder. °· Kidney disease, kidney stones, or infection. °· Pancreas infection or cancer. °· Fibromyalgia (pain disorder). °· Diseases of the female organs: °· Uterus: fibroid (non-cancerous) tumors or infection. °· Fallopian tubes: infection or tubal pregnancy. °· Ovary: cysts or tumors. °· Pelvic adhesions (scar tissue). °· Endometriosis (uterus lining tissue growing in the pelvis and on the pelvic organs). °· Pelvic congestion syndrome (female organs filling up with blood just before the menstrual period). °· Pain with the menstrual period. °· Pain with ovulation (producing an egg). °· Pain with an IUD (intrauterine device, birth control) in the uterus. °· Cancer of the female organs. °· Functional pain (pain not caused by a disease, may improve without treatment). °· Psychological pain. °· Depression. °DIAGNOSIS  °Your doctor will decide the seriousness of your pain by doing an examination. °· Blood tests. °· X-rays. °· Ultrasound. °· CT scan (computed tomography, special type of X-ray). °· MRI (magnetic resonance imaging). °· Cultures, for infection. °· Barium enema (dye inserted in the large intestine, to better view it with  X-rays). °· Colonoscopy (looking in intestine with a lighted tube). °· Laparoscopy (minor surgery, looking in abdomen with a lighted tube). °· Major abdominal exploratory surgery (looking in abdomen with a large incision). °TREATMENT  °The treatment will depend on the cause of the pain.  °· Many cases can be observed and treated at home. °· Over-the-counter medicines recommended by your caregiver. °· Prescription medicine. °· Antibiotics, for infection. °· Birth control pills, for painful periods or for ovulation pain. °· Hormone treatment, for endometriosis. °· Nerve blocking injections. °· Physical therapy. °· Antidepressants. °· Counseling with a psychologist or psychiatrist. °· Minor or major surgery. °HOME CARE INSTRUCTIONS  °· Do not take laxatives, unless directed by your caregiver. °· Take over-the-counter pain medicine only if ordered by your caregiver. Do not take aspirin because it can cause an upset stomach or bleeding. °· Try a clear liquid diet (broth or water) as ordered by your caregiver. Slowly move to a bland diet, as tolerated, if the pain is related to the stomach or intestine. °· Have a thermometer and take your temperature several times a day, and record it. °· Bed rest and sleep, if it helps the pain. °· Avoid sexual intercourse, if it causes pain. °· Avoid stressful situations. °· Keep your follow-up appointments and tests, as your caregiver orders. °· If the pain does not go away with medicine or surgery, you may try: °¨ Acupuncture. °¨ Relaxation exercises (yoga, meditation). °¨ Group therapy. °¨ Counseling. °SEEK MEDICAL CARE IF:  °· You notice certain foods cause stomach pain. °· Your home care treatment is not helping your pain. °· You need stronger pain medicine. °· You want your IUD removed. °· You feel faint or   lightheaded. °· You develop nausea and vomiting. °· You develop a rash. °· You are having side effects or an allergy to your medicine. °SEEK IMMEDIATE MEDICAL CARE IF:  °· Your  pain does not go away or gets worse. °· You have a fever. °· Your pain is felt only in portions of the abdomen. The right side could possibly be appendicitis. The left lower portion of the abdomen could be colitis or diverticulitis. °· You are passing blood in your stools (bright red or black tarry stools, with or without vomiting). °· You have blood in your urine. °· You develop chills, with or without a fever. °· You pass out. °MAKE SURE YOU:  °· Understand these instructions. °· Will watch your condition. °· Will get help right away if you are not doing well or get worse. °Document Released: 08/27/2007 Document Revised: 03/16/2014 Document Reviewed: 09/16/2009 °[employer]® Patient Information ©2015 [employer]. This information is not intended to replace advice given to you by your health care provider. Make sure you discuss any questions you have with your health care provider. °Bacterial Vaginosis °Bacterial vaginosis is an infection of the vagina. It happens when too many of certain germs (bacteria) grow in the vagina. °HOME CARE °· Take your medicine as told by your doctor. °· Finish your medicine even if you start to feel better. °· Do not have sex until you finish your medicine and are better. °· Tell your sex partner that you have an infection. They should see their doctor for treatment. °· Practice safe sex. Use condoms. Have only one sex partner. °GET HELP IF: °· You are not getting better after 3 days of treatment. °· You have more grey fluid (discharge) coming from your vagina than before. °· You have more pain than before. °· You have a fever. °MAKE SURE YOU:  °· Understand these instructions. °· Will watch your condition. °· Will get help right away if you are not doing well or get worse. °Document Released: 08/08/2008 Document Revised: 08/20/2013 Document Reviewed: 06/11/2013 °Exit[employer]® Patient Information ©2015 [employer]. This information is not intended to replace advice given to you by your  health care provider. Make sure you discuss any questions you have with your health care provider. ° °

## 2015-05-07 NOTE — MAU Note (Signed)
Pt seen in ED on 6/19, dx'd with PID, ovarian cyst, & BV.  Pt started spotting today, Is having RLQ & RUQ pain, also R flank, nausea & dysuria.   Denies fever.

## 2015-05-07 NOTE — MAU Note (Signed)
Pt states she was unable to take flagyl, it makes her sick.  Is still taking her doxycycline.

## 2015-05-25 ENCOUNTER — Encounter: Payer: Self-pay | Admitting: Obstetrics

## 2015-05-25 ENCOUNTER — Ambulatory Visit (INDEPENDENT_AMBULATORY_CARE_PROVIDER_SITE_OTHER): Payer: Medicaid Other | Admitting: Obstetrics

## 2015-05-25 VITALS — BP 121/77 | HR 78 | Temp 96.2°F | Wt 162.6 lb

## 2015-05-25 DIAGNOSIS — B373 Candidiasis of vulva and vagina: Secondary | ICD-10-CM

## 2015-05-25 DIAGNOSIS — N731 Chronic parametritis and pelvic cellulitis: Secondary | ICD-10-CM | POA: Diagnosis not present

## 2015-05-25 DIAGNOSIS — A499 Bacterial infection, unspecified: Secondary | ICD-10-CM

## 2015-05-25 DIAGNOSIS — B3731 Acute candidiasis of vulva and vagina: Secondary | ICD-10-CM

## 2015-05-25 DIAGNOSIS — B9689 Other specified bacterial agents as the cause of diseases classified elsewhere: Secondary | ICD-10-CM

## 2015-05-25 DIAGNOSIS — N760 Acute vaginitis: Secondary | ICD-10-CM | POA: Diagnosis not present

## 2015-05-25 MED ORDER — OXYCODONE HCL 10 MG PO TABS: 10.0000 mg | ORAL_TABLET | Freq: Four times a day (QID) | ORAL | Status: DC | PRN

## 2015-05-25 MED ORDER — FLUCONAZOLE 150 MG PO TABS: 150.0000 mg | ORAL_TABLET | Freq: Once | ORAL | Status: DC

## 2015-05-25 MED ORDER — METRONIDAZOLE 0.75 % VA GEL: 1.0000 | Freq: Two times a day (BID) | VAGINAL | Status: DC

## 2015-05-25 MED ORDER — AMOXICILLIN-POT CLAVULANATE 875-125 MG PO TABS
1.0000 | ORAL_TABLET | Freq: Two times a day (BID) | ORAL | Status: DC
Start: 2015-05-25 — End: 2015-06-08
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
## 2015-05-25 NOTE — Addendum Note (Signed)
Addended by: Henriette CombsHATTON, Narely Nobles L on: 05/25/2015 01:35 PM   Modules accepted: Orders

## 2015-05-27 LAB — SURESWAB, VAGINOSIS/VAGINITIS PLUS
Atopobium vaginae: 7.4 Log (cells/mL)
C. albicans, DNA: DETECTED — AB
C. glabrata, DNA: NOT DETECTED
C. parapsilosis, DNA: NOT DETECTED
C. trachomatis RNA, TMA: NOT DETECTED
C. tropicalis, DNA: NOT DETECTED
Gardnerella vaginalis: >8 Log (cells/mL)
LACTOBACILLUS SPECIES: NOT DETECTED Log (cells/mL)
MEGASPHAERA SPECIES: >8 Log (cells/mL)
N. gonorrhoeae RNA, TMA: NOT DETECTED
T. vaginalis RNA, QL TMA: NOT DETECTED

## 2015-06-07 ENCOUNTER — Ambulatory Visit: Payer: Medicaid Other | Admitting: Obstetrics

## 2015-06-08 ENCOUNTER — Encounter: Payer: Self-pay | Admitting: Obstetrics

## 2015-06-08 ENCOUNTER — Ambulatory Visit (INDEPENDENT_AMBULATORY_CARE_PROVIDER_SITE_OTHER): Payer: Medicaid Other | Admitting: Obstetrics

## 2015-06-08 VITALS — BP 128/88 | HR 91 | Temp 98.3°F | Ht 67.0 in | Wt 170.0 lb

## 2015-06-08 DIAGNOSIS — N731 Chronic parametritis and pelvic cellulitis: Secondary | ICD-10-CM | POA: Diagnosis not present

## 2015-06-08 DIAGNOSIS — B373 Candidiasis of vulva and vagina: Secondary | ICD-10-CM | POA: Diagnosis not present

## 2015-06-08 DIAGNOSIS — B3731 Acute candidiasis of vulva and vagina: Secondary | ICD-10-CM

## 2015-06-08 LAB — POCT URINALYSIS DIPSTICK
Bilirubin, UA: NEGATIVE
Blood, UA: 50
Glucose, UA: NEGATIVE
Ketones, UA: NEGATIVE
Nitrite, UA: NEGATIVE
Protein, UA: NEGATIVE
Spec Grav, UA: 1.025
Urobilinogen, UA: NEGATIVE
pH, UA: 5

## 2015-06-08 MED ORDER — TERCONAZOLE 0.4 % VA CREA: 1.0000 | TOPICAL_CREAM | Freq: Every day | VAGINAL | Status: DC

## 2015-06-08 MED ORDER — OXYCODONE HCL 10 MG PO TABS: 10.0000 mg | ORAL_TABLET | Freq: Four times a day (QID) | ORAL | Status: DC | PRN

## 2015-06-08 MED ORDER — AMOXICILLIN-POT CLAVULANATE 875-125 MG PO TABS: 1.0000 | ORAL_TABLET | Freq: Two times a day (BID) | ORAL | Status: DC

## 2015-06-08 MED ORDER — FLUCONAZOLE 200 MG PO TABS: ORAL_TABLET | ORAL | Status: DC
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
## 2015-07-09 LAB — URINALYSIS
Bilirubin, Urine: NEGATIVE NA
Ketones, Urine: NEGATIVE mg/dL
LEUKOCYTES, UA: NEGATIVE NA
Nitrite, Urine: NEGATIVE NA
Occult Blood,Urine: NEGATIVE {RBC}/uL
Specific Gravity, Urine: 1.02 NA (ref 1.005–1.030)
Total Protein, Urine: NEGATIVE mg/dL
pH, Urine: 5 NA (ref 5.0–8.0)

## 2015-07-09 LAB — HCG, QUANTITATIVE, PREGNANCY: hCG Quant: 167 m[IU]/mL — AB (ref <3)

## 2015-07-09 LAB — PREGNANCY, URINE: HCG Urine: POSITIVE NA
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
## 2015-07-16 ENCOUNTER — Inpatient Hospital Stay (HOSPITAL_COMMUNITY): Payer: Medicaid Other

## 2015-07-16 ENCOUNTER — Inpatient Hospital Stay (HOSPITAL_COMMUNITY)
Admission: AD | Admit: 2015-07-16 | Discharge: 2015-07-16 | Disposition: A | Discharge status: Home / Self Care | Payer: Medicaid Other | Source: Ambulatory Visit | Attending: Obstetrics | Admitting: Obstetrics

## 2015-07-16 ENCOUNTER — Encounter (HOSPITAL_COMMUNITY): Payer: Self-pay | Admitting: *Deleted

## 2015-07-16 DIAGNOSIS — O001 Tubal pregnancy: Secondary | ICD-10-CM | POA: Diagnosis not present

## 2015-07-16 DIAGNOSIS — Z882 Allergy status to sulfonamides status: Secondary | ICD-10-CM | POA: Diagnosis not present

## 2015-07-16 DIAGNOSIS — Z886 Allergy status to analgesic agent status: Secondary | ICD-10-CM | POA: Insufficient documentation

## 2015-07-16 DIAGNOSIS — M545 Low back pain: Secondary | ICD-10-CM | POA: Diagnosis present

## 2015-07-16 DIAGNOSIS — Z9851 Tubal ligation status: Secondary | ICD-10-CM | POA: Insufficient documentation

## 2015-07-16 DIAGNOSIS — Z885 Allergy status to narcotic agent status: Secondary | ICD-10-CM | POA: Diagnosis not present

## 2015-07-16 DIAGNOSIS — O009 Ectopic pregnancy, unspecified: Secondary | ICD-10-CM | POA: Diagnosis not present

## 2015-07-16 DIAGNOSIS — Z87891 Personal history of nicotine dependence: Secondary | ICD-10-CM | POA: Diagnosis not present

## 2015-07-16 LAB — WET PREP, GENITAL
Trich, Wet Prep: NONE SEEN
Yeast Wet Prep HPF POC: NONE SEEN

## 2015-07-16 LAB — POCT PREGNANCY, URINE: Preg Test, Ur: POSITIVE — AB

## 2015-07-16 LAB — CBC
HCT: 37.3 % (ref 36.0–46.0)
Hemoglobin: 13 g/dL (ref 12.0–15.0)
MCH: 26.5 pg (ref 26.0–34.0)
MCHC: 34.9 g/dL (ref 30.0–36.0)
MCV: 76 fL — ABNORMAL LOW (ref 78.0–100.0)
Platelets: 219 10*3/uL (ref 150–400)
RBC: 4.91 MIL/uL (ref 3.87–5.11)
RDW: 14.3 % (ref 11.5–15.5)
WBC: 8.2 10*3/uL (ref 4.0–10.5)

## 2015-07-16 LAB — CREATININE, SERUM
Creatinine, Ser: 0.44 mg/dL (ref 0.44–1.00)
GFR calc Af Amer: >60 mL/min (ref >60)
GFR calc non Af Amer: >60 mL/min (ref >60)

## 2015-07-16 LAB — URINALYSIS, ROUTINE W REFLEX MICROSCOPIC
Bilirubin Urine: NEGATIVE
Glucose, UA: NEGATIVE mg/dL
Ketones, ur: NEGATIVE mg/dL
Leukocytes, UA: NEGATIVE
Nitrite: NEGATIVE
Protein, ur: NEGATIVE mg/dL
Specific Gravity, Urine: 1.025 (ref 1.005–1.030)
Urobilinogen, UA: 0.2 mg/dL (ref 0.0–1.0)
pH: 6 (ref 5.0–8.0)

## 2015-07-16 LAB — ABO/RH: ABO/RH(D): O POS

## 2015-07-16 LAB — HCG, QUANTITATIVE, PREGNANCY: hCG, Beta Chain, Quant, S: 484 m[IU]/mL — ABNORMAL HIGH (ref <5)

## 2015-07-16 LAB — BUN: BUN: 13 mg/dL (ref 6–20)

## 2015-07-16 LAB — AST: AST: 14 U/L — ABNORMAL LOW (ref 15–41)

## 2015-07-16 LAB — URINE MICROSCOPIC-ADD ON
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
## 2015-07-16 NOTE — Progress Notes (Signed)
AC, Tia Alert spoke with someone who will attempt to speak with patient over the phone at some point.
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
## 2015-07-16 NOTE — Progress Notes (Signed)
Unable to reach a Child psychotherapist after numerous attempts. Calls made to [facility] SW, voice messages left. Calls made to [employer] and [facility] SW #'s per [facility] call list. Gregary Signs, RN notified and she will attempt to reach SW.

## 2015-07-16 NOTE — MAU Note (Signed)
Pos HPT this a.m., has had BTL, LLQ & back pain started last night, also started spotting today, has noted foul odor.
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
## 2015-07-17 LAB — HIV ANTIBODY (ROUTINE TESTING W REFLEX): HIV Screen 4th Generation wRfx: NONREACTIVE

## 2015-07-18 ENCOUNTER — Encounter (HOSPITAL_COMMUNITY): Payer: Self-pay | Admitting: *Deleted

## 2015-07-18 ENCOUNTER — Inpatient Hospital Stay (HOSPITAL_COMMUNITY)
Admission: AD | Admit: 2015-07-18 | Discharge: 2015-07-18 | Discharge status: Against Medical Advice | Payer: Medicaid Other | Source: Ambulatory Visit | Attending: Obstetrics | Admitting: Obstetrics

## 2015-07-18 ENCOUNTER — Inpatient Hospital Stay (HOSPITAL_COMMUNITY): Payer: Medicaid Other

## 2015-07-18 DIAGNOSIS — O009 Unspecified ectopic pregnancy without intrauterine pregnancy: Secondary | ICD-10-CM

## 2015-07-18 DIAGNOSIS — Z87891 Personal history of nicotine dependence: Secondary | ICD-10-CM | POA: Diagnosis not present

## 2015-07-18 DIAGNOSIS — Z882 Allergy status to sulfonamides status: Secondary | ICD-10-CM | POA: Diagnosis not present

## 2015-07-18 DIAGNOSIS — N939 Abnormal uterine and vaginal bleeding, unspecified: Secondary | ICD-10-CM | POA: Diagnosis present

## 2015-07-18 DIAGNOSIS — Z9851 Tubal ligation status: Secondary | ICD-10-CM | POA: Insufficient documentation

## 2015-07-18 DIAGNOSIS — Z886 Allergy status to analgesic agent status: Secondary | ICD-10-CM | POA: Insufficient documentation

## 2015-07-18 DIAGNOSIS — Z885 Allergy status to narcotic agent status: Secondary | ICD-10-CM | POA: Insufficient documentation

## 2015-07-18 LAB — URINALYSIS, ROUTINE W REFLEX MICROSCOPIC
Glucose, UA: NEGATIVE mg/dL
Hgb urine dipstick: NEGATIVE
Ketones, ur: NEGATIVE mg/dL
Leukocytes, UA: NEGATIVE
Nitrite: NEGATIVE
Protein, ur: NEGATIVE mg/dL
Specific Gravity, Urine: >1.03 — ABNORMAL HIGH (ref 1.005–1.030)
Urobilinogen, UA: 0.2 mg/dL (ref 0.0–1.0)
pH: 5.5 (ref 5.0–8.0)

## 2015-07-18 LAB — CBC WITH DIFFERENTIAL/PLATELET
Basophils Absolute: 0 10*3/uL (ref 0.0–0.1)
Basophils Relative: 0 % (ref 0–1)
Eosinophils Absolute: 0.2 10*3/uL (ref 0.0–0.7)
Eosinophils Relative: 3 % (ref 0–5)
HCT: 34.6 % — ABNORMAL LOW (ref 36.0–46.0)
Hemoglobin: 11.9 g/dL — ABNORMAL LOW (ref 12.0–15.0)
Lymphocytes Relative: 29 % (ref 12–46)
Lymphs Abs: 2 10*3/uL (ref 0.7–4.0)
MCH: 26.2 pg (ref 26.0–34.0)
MCHC: 34.4 g/dL (ref 30.0–36.0)
MCV: 76 fL — ABNORMAL LOW (ref 78.0–100.0)
Monocytes Absolute: 0.9 10*3/uL (ref 0.1–1.0)
Monocytes Relative: 12 % (ref 3–12)
Neutro Abs: 3.9 10*3/uL (ref 1.7–7.7)
Neutrophils Relative %: 56 % (ref 43–77)
Platelets: 211 10*3/uL (ref 150–400)
RBC: 4.55 MIL/uL (ref 3.87–5.11)
RDW: 14.3 % (ref 11.5–15.5)
WBC: 7 10*3/uL (ref 4.0–10.5)

## 2015-07-18 LAB — HCG, QUANTITATIVE, PREGNANCY: hCG, Beta Chain, Quant, S: 338 m[IU]/mL — ABNORMAL HIGH (ref <5)

## 2015-07-18 MED ORDER — ACETAMINOPHEN-CODEINE #3 300-30 MG PO TABS
1.0000 | ORAL_TABLET | Freq: Once | ORAL | Status: AC
  Administered 2015-07-18: 1 via ORAL
  Filled 2015-07-18: qty 1

## 2015-07-18 MED ORDER — METHOTREXATE INJECTION FOR WOMEN'S HOSPITAL
50.0000 mg/m2 | Freq: Once | INTRAMUSCULAR | Status: DC
  Filled 2015-07-18: qty 1.9

## 2015-07-18 NOTE — Discharge Instructions (Signed)
Ectopic Pregnancy °An ectopic pregnancy is when the fertilized egg attaches (implants) outside the uterus. Most ectopic pregnancies occur in the fallopian tube. Rarely do ectopic pregnancies occur on the ovary, intestine, pelvis, or cervix. In an ectopic pregnancy, the fertilized egg does not have the ability to develop into a normal, healthy baby.  °A ruptured ectopic pregnancy is one in which the fallopian tube gets torn or bursts and results in internal bleeding. Often there is intense abdominal pain, and sometimes, vaginal bleeding. Having an ectopic pregnancy can be life threatening. If left untreated, this dangerous condition can lead to a blood transfusion, abdominal surgery, or even death. °CAUSES  °Damage to the fallopian tubes is the suspected cause in most ectopic pregnancies.  °RISK FACTORS °Depending on your circumstances, the risk of having an ectopic pregnancy will vary. The level of risk can be divided into three categories. °High Risk °· You have gone through infertility treatment. °· You have had a previous ectopic pregnancy. °· You have had previous tubal surgery. °· You have had previous surgery to have the fallopian tubes tied (tubal ligation). °· You have tubal problems or diseases. °· You have been exposed to DES. DES is a medicine that was used until 1971 and had effects on babies whose mothers took the medicine. °· You become pregnant while using an intrauterine device (IUD) for birth control.  °Moderate Risk °· You have a history of infertility. °· You have a history of a sexually transmitted infection (STI). °· You have a history of pelvic inflammatory disease (PID). °· You have scarring from endometriosis. °· You have multiple sexual partners. °· You smoke.  °Low Risk °· You have had previous pelvic surgery. °· You use vaginal douching. °· You became sexually active before 29 years of age. °SIGNS AND SYMPTOMS  °An ectopic pregnancy should be suspected in anyone who has missed a period and  has abdominal pain or bleeding. °· You may experience normal pregnancy symptoms, such as: °· Nausea. °· Tiredness. °· Breast tenderness. °· Other symptoms may include: °· Pain with intercourse. °· Irregular vaginal bleeding or spotting. °· Cramping or pain on one side or in the lower abdomen. °· Fast heartbeat. °· Passing out while having a bowel movement. °· Symptoms of a ruptured ectopic pregnancy and internal bleeding may include: °· Sudden, severe pain in the abdomen and pelvis. °· Dizziness or fainting. °· Pain in the shoulder area. °DIAGNOSIS  °Tests that may be performed include: °· A pregnancy test. °· An ultrasound test. °· Testing the specific level of pregnancy hormone in the bloodstream. °· Taking a sample of uterus tissue (dilation and curettage, D&C). °· Surgery to perform a visual exam of the inside of the abdomen using a thin, lighted tube with a tiny camera on the end (laparoscope). °TREATMENT  °An injection of a medicine called methotrexate may be given. This medicine causes the pregnancy tissue to be absorbed. It is given if: °· The diagnosis is made early. °· The fallopian tube has not ruptured. °· You are considered to be a good candidate for the medicine. °Usually, pregnancy hormone blood levels are checked after methotrexate treatment. This is to be sure the medicine is effective. It may take 4-6 weeks for the pregnancy to be absorbed (though most pregnancies will be absorbed by 3 weeks). °Surgical treatment may be needed. A laparoscope may be used to remove the pregnancy tissue. If severe internal bleeding occurs, a cut (incision) may be made in the lower abdomen (laparotomy), and the ectopic   pregnancy is removed. This stops the bleeding. Part of the fallopian tube, or the whole tube, may be removed as well (salpingectomy). After surgery, pregnancy hormone tests may be done to be sure there is no pregnancy tissue left. You may receive a Rho (D) immune globulin shot if you are Rh negative and  the father is Rh positive, or if you do not know the Rh type of the father. This is to prevent problems with any future pregnancy. °SEEK IMMEDIATE MEDICAL CARE IF:  °You have any symptoms of an ectopic pregnancy. This is a medical emergency. °MAKE SURE YOU: °· Understand these instructions. °· Will watch your condition. °· Will get help right away if you are not doing well or get worse. °Document Released: 12/07/2004 Document Revised: 03/16/2014 Document Reviewed: 05/29/2013 °[employer]® Patient Information ©2015 [employer]. This information is not intended to replace advice given to you by your health care provider. Make sure you discuss any questions you have with your health care provider. °Methotrexate Treatment for an Ectopic Pregnancy °Methotrexate is a medicine that treats ectopic pregnancy by stopping the growth of the fertilized egg. It also helps your body absorb tissue from the egg. This takes between 2 weeks and 6 weeks. Most ectopic pregnancies can be successfully treated with methotrexate if they are detected early enough. °LET YOUR HEALTH CARE PROVIDER KNOW ABOUT: °· Any allergies you have. °· All medicines you are taking, including vitamins, herbs, eye drops, creams, and over-the-counter medicines. °· Medical conditions you have. °RISKS AND COMPLICATIONS °Generally, this is a safe treatment. However, as with any treatment, problems can occur. Possible problems or side effects include: °· Nausea. °· Vomiting. °· Diarrhea. °· Abdominal cramping. °· Mouth sores. °· Increased vaginal bleeding or spotting.   °· Swelling or irritation of the lining of your lungs (pneumonitis).  °· Failed treatment and continuation of the pregnancy.   °· Liver damage. °· Hair loss. °There is still a risk of the ectopic pregnancy rupturing while using the methotrexate. °BEFORE THE PROCEDURE °Before you take the medicine:  °· Liver tests, kidney tests, and a complete blood test are performed. °· Blood tests are performed to  measure the pregnancy hormone levels and to determine your blood type. °· If you are Rh-negative and the father is Rh-positive or his Rh type is not known, you will be given a Rho (D) immune globulin shot. °PROCEDURE  °There are two methods that your health care provider may use to prescribe methotrexate. One method involves a single dose or injection of the medicine. Another method involves a series of doses given through several injections.  °AFTER THE PROCEDURE °· You may have some abdominal cramping, vaginal bleeding, and fatigue in the first few days after taking methotrexate. °· Blood tests will be taken for several weeks to check the pregnancy hormone levels. The blood tests are performed until there is no more pregnancy hormone detected in the blood. °Document Released: 10/24/2001 Document Revised: 03/16/2014 Document Reviewed: 08/18/2013 °[employer]® Patient Information ©2015 [employer]. This information is not intended to replace advice given to you by your health care provider. Make sure you discuss any questions you have with your health care provider. ° °

## 2015-07-18 NOTE — MAU Note (Signed)
[facility] called and asked to speak with patient per patient's request.

## 2015-07-18 NOTE — MAU Provider Note (Signed)
History     CSN: [id number]  Arrival date and time: 07/18/15 1017   None     Chief Complaint  Patient presents with  . Vaginal Bleeding   HPI Leah Barry 29 y.o. [id number] @Unknown  presents to MAU complaining of vaginal bleeding and pain.  Pain is abdomen - lower left side, 8/10, dull, crampy, constant.  She has been bleeding small amounts for a while.  2 days ago, it stopped for a day and she was seen here.  She started bleeding heavily last night (5am today).  She came in for eval at this time.  She feeling lightheaded.  Mild HA denies syncope, SOB, CP.   OB History    Gravida Para Term Preterm AB TAB SAB Ectopic Multiple Living   5 4 4       4       Past Medical History  Diagnosis Date  . Anxiety   . Depression   . Bipolar disorder     no current med.  . Irritable bowel syndrome (IBS)     no current med.  [name] Ulnar fracture 03/09/2014    left  . Scalp laceration     staples to be removed 03/13/2014  . History of MRSA infection 2010  . Apnea, sleep     no CPAP use, states lost machine during move to [place]  . Pelvic inflammatory disease   . Bipolar 1 disorder     Past Surgical History  Procedure Laterality Date  . Ovarian cyst removal    . Tubal ligation    . Thyroid cyst excision      nodule exc.  [name] Cyst excision      middle of back  . Orif ulnar fracture Left 03/16/2014    Procedure: OPEN REDUCTION INTERNAL FIXATION (ORIF) LEFT ULNAR FRACTURE;  Surgeon: Marlowe Shores, MD;  Location: [facility];  Service: Orthopedics;  Laterality: Left;  left    Family History  Problem Relation Age of Onset  . Heart disease Mother   . Cancer Maternal Grandmother     breast  . Cancer Paternal Grandmother     breast    Social History  Substance Use Topics  . Smoking status: Former Smoker -- 3 years    Types: Cigarettes  . Smokeless tobacco: Never Used     Comment: 1-2 cig./day  . Alcohol Use: No    Allergies:  Allergies  Allergen Reactions  .  Contrast Media [Iodinated Diagnostic Agents] Itching    Pt developed itching after contrast administration; patient had also received Fentanyl immediately prior to transport to [place]. The itching was without any rash, it was concentrated in the face/nose, and relieved with Benadryl. Possible that itching a side effect of pain medication rather than contrast.  . Sulfa Antibiotics Swelling    SWELLING OF EYES  . Toradol [Ketorolac Tromethamine] Itching  . Bee Venom Swelling    "Bees make my eyes swell."  . Flagyl [Metronidazole] Nausea And Vomiting  . Naproxen Other (See Comments)    NOSE BLEED  . Bactrim [Sulfamethoxazole-Trimethoprim]     Eyes swell    Prescriptions prior to admission  Medication Sig Dispense Refill Last Dose  . acetaminophen (TYLENOL) 325 MG tablet Take 650 mg by mouth every 6 (six) hours as needed for moderate pain or headache.   prn  . amoxicillin-clavulanate (AUGMENTIN) 875-125 MG per tablet Take 1 tablet by mouth 2 (two) times daily. (Patient not taking: Reported on 07/16/2015) 28 tablet 2  Completed Course at Unknown time  . doxycycline (VIBRAMYCIN) 100 MG capsule Take 1 capsule (100 mg total) by mouth 2 (two) times daily. (Patient not taking: Reported on 05/25/2015) 28 capsule 0 Not Taking at Unknown time  . fluconazole (DIFLUCAN) 200 MG tablet Take 1 tab every other day. (Patient not taking: Reported on 07/16/2015) 3 tablet 2 Completed Course at Unknown time  . Oxycodone HCl 10 MG TABS Take 1 tablet (10 mg total) by mouth every 6 (six) hours as needed. (Patient not taking: Reported on 07/16/2015) 40 tablet 0 Not Taking at Unknown time  . terconazole (TERAZOL 7) 0.4 % vaginal cream Place 1 applicator vaginally at bedtime. (Patient not taking: Reported on 07/16/2015) 45 g 2 Not Taking at Unknown time    ROS Pertinent ROS in HPI.  All other systems are negative.   Physical Exam   Blood pressure 116/75, pulse 94, temperature 97.9 F (36.6 C), temperature source Oral, resp. rate  18, height  (1.702 m), weight 169 lb 3 oz (76.743 kg).  Physical Exam  Constitutional: She is oriented to person, place, and time. She appears well-developed and well-nourished. No distress.  HENT:  Head: Normocephalic and atraumatic.  Eyes: EOM are normal.  Neck: Normal range of motion.  Cardiovascular: Normal rate.   Respiratory: Breath sounds normal. No respiratory distress.  GI: Soft. She exhibits no distension. There is tenderness.  Musculoskeletal: Normal range of motion.  Neurological: She is alert and oriented to person, place, and time.  Skin: Skin is warm and dry.  Psychiatric: She has a normal mood and affect.   Results for orders placed or performed during the hospital encounter of 07/18/15 (from the past 24 hour(s))  Urinalysis, Routine w reflex microscopic (not at [facility])     Status: Abnormal   Collection Time: 07/18/15 11:45 AM  Result Value Ref Range   Color, Urine AMBER (A) YELLOW   APPearance HAZY (A) CLEAR   Specific Gravity, Urine >1.030 (H) 1.005 - 1.030   pH 5.5 5.0 - 8.0   Glucose, UA NEGATIVE NEGATIVE mg/dL   Hgb urine dipstick NEGATIVE NEGATIVE   Bilirubin Urine SMALL (A) NEGATIVE   Ketones, ur NEGATIVE NEGATIVE mg/dL   Protein, ur NEGATIVE NEGATIVE mg/dL   Urobilinogen, UA 0.2 0.0 - 1.0 mg/dL   Nitrite NEGATIVE NEGATIVE   Leukocytes, UA NEGATIVE NEGATIVE  CBC with Differential/Platelet     Status: Abnormal   Collection Time: 07/18/15 12:01 PM  Result Value Ref Range   WBC 7.0 4.0 - 10.5 K/uL   RBC 4.55 3.87 - 5.11 MIL/uL   Hemoglobin 11.9 (L) 12.0 - 15.0 g/dL   HCT 16.1 (L) 09.6 - 04.5 %   MCV 76.0 (L) 78.0 - 100.0 fL   MCH 26.2 26.0 - 34.0 pg   MCHC 34.4 30.0 - 36.0 g/dL   RDW 40.9 81.1 - 91.4 %   Platelets 211 150 - 400 K/uL   Neutrophils Relative % 56 43 - 77 %   Neutro Abs 3.9 1.7 - 7.7 K/uL   Lymphocytes Relative 29 12 - 46 %   Lymphs Abs 2.0 0.7 - 4.0 K/uL   Monocytes Relative 12 3 - 12 %   Monocytes Absolute 0.9 0.1 - 1.0 K/uL    Eosinophils Relative 3 0 - 5 %   Eosinophils Absolute 0.2 0.0 - 0.7 K/uL   Basophils Relative 0 0 - 1 %   Basophils Absolute 0.0 0.0 - 0.1 K/uL  hCG, quantitative, pregnancy  Status: Abnormal   Collection Time: 07/18/15 12:01 PM  Result Value Ref Range   hCG, Beta Chain, Quant, S 338 (H) <5 mIU/mL   US Ob Comp Less 14 Wks  07/16/2015   CLINICAL DATA:  Left lower quadrant pelvic pain and back pain. Positive urine pregnancy test. History of bilateral tubal ligation.  EXAM: OBSTETRIC <14 WK [place] AND TRANSVAGINAL OB US  TECHNIQUE: Both transabdominal and transvaginal ultrasound examinations were performed for complete evaluation of the gestation as well as the maternal uterus, adnexal regions, and pelvic cul-de-sac. Transvaginal technique was performed to assess early pregnancy.  COMPARISON:  CT 05/02/2015, pelvic ultrasound 10/30/2014  FINDINGS: Intrauterine gestational sac: Not visualized  Yolk sac:  Not visualized  Embryo:  Not visualized  Cardiac Activity: Not visualized  Adjacent to the right ovary is an ovoid mass measuring 2.4 x 1.2 x 1.0 cm. Trace right adnexal free fluid is identified. No internal gestational sac is identified.  Left ovary appears normal.  Trace free fluid.  IMPRESSION: Ovoid soft tissue mass adjacent to the right ovary without intrauterine gestational sac present, highly suspicious for ectopic pregnancy. Intrauterine gestation too early to be sonographically visualized or complicated exophytic paraovarian cyst is less likely.  Critical Value/emergent results were called by telephone at the time of interpretation on 07/16/2015 at 5:40 pm to Dr. Sharen Counter , by sonographer Herbert Seta, who verbally acknowledged these results.   Electronically Signed   By: Christiana Pellant M.D.   On: 07/16/2015 17:48   US Ob Transvaginal  07/18/2015   CLINICAL DATA:  Followup possible right adnexal ectopic pregnancy. Vaginal bleeding. Quantitative beta HCG today of 338. The quantitative beta HCG on  07/16/2015 was 484. Previous bilateral tubal ligation.  EXAM: TRANSVAGINAL OB ULTRASOUND  TECHNIQUE: Transvaginal ultrasound was performed for complete evaluation of the gestation as well as the maternal uterus, adnexal regions, and pelvic cul-de-sac.  COMPARISON:  Previous examinations, the most recent dated 07/16/2015.  FINDINGS: Intrauterine gestational sac: Not visualized  Yolk sac:  Not visualized  Embryo:  Not visualized  Cardiac Activity: Not visualized  Maternal uterus/adnexae: Again demonstrated is a right adnexal oval, mildly echogenic, solid appearing mass. This currently measures 1.8 x 1.4 x 1.3 cm and previously measured 2.4 x 1.2 x 1.0 cm. This has peripheral blood flow with color Doppler. There is an adjacent probable corpus luteum in the right ovary with prominent internal blood flow with color Doppler. Otherwise, the ovaries have normal appearances. Normal appearing uterus with a normal appearing endometrial stripe measuring 2.9 mm in thickness. A small amount of free peritoneal fluid is noted, mildly increased.  IMPRESSION: 1. Persistent right adnexal mass adjacent to the right ovary, with little overall change in size. This remains suspicious for a right ectopic pregnancy. Based on the dropping quantitative beta HCG, this may represent a right ectopic pregnancy with fetal demise. 2. Small amount of free peritoneal fluid, mildly increased. This raises the possibility of a ruptured ectopic pregnancy. However, this does not have the complex appearance expected for blood. Critical Value/emergent results were called by telephone at the time of interpretation on 07/18/2015 at 2:25 pm to [facility] , who verbally acknowledged these results.   Electronically Signed   By: Beckie Salts M.D.   On: 07/18/2015 14:27   US Ob Transvaginal  07/16/2015   CLINICAL DATA:  Left lower quadrant pelvic pain and back pain. Positive urine pregnancy test. History of bilateral tubal ligation.  EXAM: OBSTETRIC  <14 WK [place] AND  TRANSVAGINAL OB US  TECHNIQUE: Both transabdominal and transvaginal ultrasound examinations were performed for complete evaluation of the gestation as well as the maternal uterus, adnexal regions, and pelvic cul-de-sac. Transvaginal technique was performed to assess early pregnancy.  COMPARISON:  CT 05/02/2015, pelvic ultrasound 10/30/2014  FINDINGS: Intrauterine gestational sac: Not visualized  Yolk sac:  Not visualized  Embryo:  Not visualized  Cardiac Activity: Not visualized  Adjacent to the right ovary is an ovoid mass measuring 2.4 x 1.2 x 1.0 cm. Trace right adnexal free fluid is identified. No internal gestational sac is identified.  Left ovary appears normal.  Trace free fluid.  IMPRESSION: Ovoid soft tissue mass adjacent to the right ovary without intrauterine gestational sac present, highly suspicious for ectopic pregnancy. Intrauterine gestation too early to be sonographically visualized or complicated exophytic paraovarian cyst is less likely.  Critical Value/emergent results were called by telephone at the time of interpretation on 07/16/2015 at 5:40 pm to Dr. Sharen Counter , by sonographer Herbert Seta, who verbally acknowledged these results.   Electronically Signed   By: Christiana Pellant M.D.   On: 07/16/2015 17:48    MAU Course  Procedures  MDM Dr. Gaynell Face consulted initially as he is on call for Dr. Clearance Coots.  He advises to give MTX today.  Okay to give Tylenol #3 for pain.   Discussed ultrasound report with Dr. Azucena Kuba - radiologist Discussed pt with Dr. Clearance Coots.  Dr. Clearance Coots spoke directly with patient whom initially suggested to him that she was agreeable to his recommendation of MTX.  However, after getting off phone with MD, she immediately told MAU provider that she is refusing medication. I have explained to patient multiple times that she is at risk for ruptured ectopic which is an emergency and can lead to death.  She repeatedly states that use of methotrexate is against  her beliefs.  She feels like she is being pressured into use of this medicine.  She is again explained that she may die if she delays treatment.  She is reminded again that the pregnancy has no chance whatsoever to develop into a baby.  She states she is willing to accept whatever God's plan is for her.     Assessment and Plan  A:  1. Ectopic pregnancy   2. Vagina bleeding    P: Discharge to home AGAINST MEDICAL ADVISE Pt refused Methotrexate for her ectopic pregnancy She is advised that she may require emergency surgery and could bleed to death  She may return for worsening of symptoms She may follow up clinically.  Bertram Denver 07/18/2015, 11:34 AM

## 2015-07-18 NOTE — MAU Note (Addendum)
Patient presents stating that she is 4-[redacted] weeks pregnant and started bleeding yesterday and the bleeding worsened last night. States she was seen in MAU on Friday and an U/S was performed and that she might have a tubal pregnancy. Was instr'd by the provider to return immediately if she started to bleed. Denies discharge. Patient states that she is unable to void on arrival to unit.

## 2015-07-18 NOTE — MAU Note (Signed)
Patient is discussing her options with her SO.

## 2015-07-18 NOTE — MAU Note (Signed)
House Coverage in room.

## 2015-07-18 NOTE — MAU Note (Signed)
Given discharge instructions on ectopic pregnancy and methotrexate treatment to read and then discuss with her SO to attempt an informed consent of treatment.

## 2015-07-19 ENCOUNTER — Telehealth (HOSPITAL_COMMUNITY): Payer: Self-pay | Admitting: *Deleted

## 2015-07-20 LAB — GC/CHLAMYDIA PROBE AMP (~~LOC~~) NOT AT ARMC
Chlamydia: NEGATIVE
Neisseria Gonorrhea: NEGATIVE

## 2015-07-23 ENCOUNTER — Inpatient Hospital Stay: Admit: 2015-07-23 | Discharge: 2015-07-24 | Disposition: A | Discharge status: Home / Self Care

## 2015-07-23 LAB — TYPE AND SCREEN
Antibody Screen: NEGATIVE NA
Rh Type: POSITIVE NA

## 2015-07-23 LAB — BASIC METABOLIC PANEL
Anion Gap: 7 NA
BUN: 13 mg/dL (ref 7–25)
CO2: 25 mmol/L (ref 21–32)
Calcium: 8.4 mg/dL (ref 8.2–10.1)
Chloride: 109 mmol/L (ref 98–109)
Creatinine: 0.59 mg/dL (ref 0.55–1.40)
EGFR IF NonAfrican American: >60 mL/min (ref >60)
Glucose: 97 mg/dL (ref 70–100)
Potassium: 3.6 mmol/L (ref 3.5–5.1)
Sodium: 141 mmol/L (ref 135–145)
eGFR African American: >60 mL/min (ref >60)

## 2015-07-23 LAB — CBC WITH AUTO DIFFERENTIAL
Absolute Baso #: 0.1 10*3/uL (ref 0.0–0.2)
Absolute Eos #: 0.3 10*3/uL (ref 0.0–0.5)
Absolute Lymph #: 2.5 10*3/uL (ref 1.0–4.3)
Absolute Mono #: 0.5 10*3/uL (ref 0.0–0.8)
Absolute Neut #: 4.2 10*3/uL (ref 1.8–7.0)
Basophils: 0.8 %
Eosinophils: 3.7 %
Granulocytes %: 56.4 %
Hematocrit: 34.9 % — ABNORMAL LOW (ref 35.0–47.0)
Hemoglobin: 11.4 g/dL — ABNORMAL LOW (ref 11.7–16.0)
Lymphocyte %: 32.9 %
MCH: 26.2 pg (ref 26.0–34.0)
MCHC: 32.6 % (ref 32.0–36.0)
MCV: 80.3 fL (ref 79.0–98.0)
MPV: 9.8 fL (ref 7.4–10.4)
Monocytes: 6.2 %
Platelets: 232 10*3/uL (ref 140–440)
RBC: 4.34 10*6/uL (ref 3.80–5.20)
RDW: 13.9 % (ref 11.5–14.5)
WBC: 7.5 10*3/uL (ref 3.6–10.7)

## 2015-07-23 LAB — HCG, QUANTITATIVE, PREGNANCY: hCG Quant: 363 m[IU]/mL — AB (ref <3)

## 2015-07-23 NOTE — Telephone Encounter (Signed)
Patient needs to be seen for follow-up quant levels on Monday for ectopic pregnancy. Thank you!

## 2015-07-23 NOTE — ED Provider Notes (Signed)
PATIENTHILDEGARD, [name]         DOS:           07/23/2015  MR #:             [id number]             ACCOUNT #:     [id number]  DATE OF BIRTH:    [birth year]              AGE:           28      HISTORY OF PRESENT ILLNESS:    PERTINENT HISTORY OF PRESENT  ILLNESS. Patient seen with Dr.  Tomasa Blase.  Patient is here for 1 week of bilateral abdominal pain. She states the  pain has  been continuous and cramping in nature. Patient states she was seen in  the  emergency department 2-3 weeks ago and found to be pregnant at that  time. She  was cautioned of the severity of this with her history of having a  bilateral  tubal ligation. She traveled to [place] one week ago and when  passing  through [place] began to have severe vaginal bleeding. She  states that  she stopped at the emergency room of a nearby hospital and a D T C was  done at  that time as well as a transvaginal ultrasound. She states the D T C  did not find  any embryonic tissues and there was no embryo in the fallopian  visualized on  the transvaginal ultrasound. She states she was advised that this  could mean  the embryo was implanted in the abdomen and that she should seek  emergency care  with any development of abdominal pain. She states she has had this  pain since  the D T C and so she is here to be evaluated for this. She denies any  recent  fevers or any other systemic symptoms at this time.    PERTINENT PAST/ FAMILY/SOCIAL HISTORY Past medical history:  Ovarian cysts        PHYSICAL EXAM Vital signs: Vital signs normal  Constitutional:  Well developed, well nourished, no acute distress,  non-toxic  appearance  Eyes:  Conjunctiva normal  HENT:  Atraumatic, external ears normal, nose normal Neck- normal  range of  motion, no tenderness, supple  Respiratory:  No respiratory distress, normal breath sounds, no rales,  no  wheezing  Cardiovascular:  Normal rate, normal rhythm, no murmurs, no gallops,  no rubs  GI:  Diffuse abdominal  tenderness with increased focal tenderness to  left upper  quadrant, nondistended, normal bowel sounds, no organomegaly, no mass,  no  rebound, no guarding  GU:  No costovertebral angle tenderness  Musculoskeletal:  No edema, no deformities. Back- no tenderness  Neurologic:  Alert  T  oriented x 3, normal motor function, normal  sensory  function, no focal deficits noted    MEDICAL DECISION MAKING:    SIGNIFICANT FINDINGS/ED COURSE/MEDICAL DECISION MAKING/TREATMENT  PLAN Patient  presented with abdominal pain and had been found to have a positive  pregnancy  test along with a significant history of bilateral tubal ligation. A  CBC was  done and significant for decreased hemoglobin of 11.4, it is unknown  what her  baseline hemoglobin is as there is not a prior to compare this to. BMP  was  unremarkable. A quantitative level was found to be elevated  at 363,  this was  elevated from 167 when seen on August 26. A pelvic transvaginal  ultrasound was  done and did not show an intrauterine pregnancy however there was a  complex  cystic structure that was noted in the right ovary/adnexa, serial hCG  and  ultrasounds were recommended for further evaluation of this. Because  of this  patient's concerning history and findings, OB/GYN was consulted and  agreed to  see the patient in the emergency room. Final disposition will be made  per  OB/GYN.    PROBLEM LIST:       Admit Reason:     Abd Pain/ Pregnant: Entered Date: 23-Jul-2015 14:39, Entered By:  Standley Brooking, Status: Active        ADDITIONAL INFORMATION The Emergency Medicine attending physician  was present  in the Emergency Department, who reviewed case management, and  approved  evaluation/treatment.      COPIES SENT TO::     NO, PCP DOCTOR(PCP): [id number]    Electronic Signatures:  CUSTODIO, DAVID (MD)  (Signed 30-Jul-2015 17:19)   Co-Signer: HISTORY OF PRESENT ILLNESS, PHYSICAL EXAM, PROBLEM LIST,  Additional  Infomation, Copies to be sent to:  [facility], Diane Friesz  Mccarty (DO)  (Signed 23-Jul-2015 18:03)   Authored: HISTORY OF PRESENT ILLNESS, PHYSICAL EXAM, MEDICAL DECISION  MAKING,  PROBLEM LIST, Additional Infomation, Copies to be sent to:      Last Updated: 30-Jul-2015 17:19 by Leona Carry (MD)            Please see T-Sheet, initial assessment, and physician orders for  further details.    Dictating Physician: Read Drivers, DO  Original Electronic Signature Date: 07/23/2015 04:05 P  EJL  Document #: [id number]    cc:  PCP No       [place]

## 2015-07-23 NOTE — Progress Notes (Signed)
[name]  07/23/2015              29 y.o.  Chief Complaint   Patient presents with   ??? Abdominal Pain                  Primary Care Physician: No primary care provider on file.  HPI:   [name] is a 29 y.o. female G5P4 who presents for evaluation of abdominal pain. She has a notable history of tubal ligation in 2011. She presented to ER on 07/09/2015 and had a BhCG level of 167; at that time she was discharged and told to follow up for quant checks for which she was seen at [facility]; no records are available at this time for her visits. Last week, she traveled to [facility] and was seen for abdominal pain and diffuse vaginal bleeding. She had the following BhCG levels: 9/2 484, 9/4 338, 07/19/2015  443. TVUS at that time showed no IUP and normal appearing adnexa with no ectopic pregnancy. She then had a D&C on 07/19/2015 which patient reported showed no products of conception on pathology. On 07/20/2015 BhCG was 390.  She returned to [place] and continued to have abdominal pain after her D&C.  Today she reports mild abdominal pain, worse on her left side. She says its achy and continuous with intermittent worsening pain. It is relieved with tylenol.  She denies current vaginal bleeding or discharge, nausea/vomiting, shortness or breath, dysuria, constipation/diarrhea.     She is blood type O positive.     OB Hx: G5P4 with vaginal deliveries x4     PMH: 1) HTN- not currently on medications  2) Bipolar disorder- not on medications  3) Anxiety- not on medications    PSH: Tubal Ligation (2011), Ovarian cystectomy, Left arm surgery, Back surgery     Allergies: Bactrim, flagyl, sulfa      Social History     Social History   ??? Marital status: Single     Spouse name: N/A   ??? Number of children: N/A   ??? Years of education: N/A     Occupational History   ??? Not on file.     Social History Main Topics   ??? Smoking status: Not on file   ??? Smokeless tobacco: Not on file   ??? Alcohol use Not on file   ??? Drug use: Not on file   ???  Sexual activity: Not on file     Other Topics Concern   ??? Not on file     Social History Narrative       Review of Systems   Denies headache, dizziness, changes in vision, LOC  Denies chest pain, palpitations  Denies shortness of breath, wheezing  + abdominal pain, denies back pain, denies constipation and diarrhea, denies nausea/vomiting  Denies dysuria, hematuria and difficulty voiding  Denies vaginal bleeding and vaginal discharge  Denies leg pain, difficulty ambulating, muscle weakness    Physical Exam   NAD, alert and oriented x3, sitting up on ED bed, conversational and cooperative  Heart RRR no g/m/r  Lungs CTAB no wheezing/rales/rhonci, normal respiratory effort  Abd soft, non distended, BSx4, tender to palpation in LLQ with mild voluntary guarding on that side, no rebound tenderness, nontender in RLQ, no CVA tenderness  Ext no edema, non tender, no erythema, DP2+   GYN deferred Reason For Exam       Recent Results (from the past 1008 hour(s))   Pregnancy, Urine  Collection Time: 07/09/15  2:00 PM   Result Value Ref Range    HCG Urine Positive Negative NA   Urinalysis    Collection Time: 07/09/15  2:00 PM   Result Value Ref Range    Appearance clear Clear NA    Color, UA yellow Lt. Yellow NA    Specific Gravity, Urine 1.020 1.005 - 1.030 NA    pH, Urine 5.0 5.0 - 8.0 NA    LEUKOCYTES, UA NEG Negative NA    Nitrite, Urine NEG Negative NA    Total Protein, Urine NEG Negative mg/dL    Glucose, Ur NORM Negative mg/dL    Ketones, Urine NEG Negative mg/dL    Urobilinogen, Urine NORM 0 - 1 mg/dL    Bilirubin, Urine NEG Negative NA    Occult Blood,Urine NEG Negative [RBC]/uL   HCG, Quantitative, Pregnancy    Collection Time: 07/09/15  3:01 PM   Result Value Ref Range    hCG Quant 167 (A) <3 m[IU]/mL   Type and Screen    Collection Time: 07/23/15  4:05 PM   Result Value Ref Range    ABO Grouping O NA    Rh Type POS NA    Antibody Screen NEG NA   CBC Auto Differential    Collection Time: 07/23/15  4:05 PM   Result  Value Ref Range    WBC 7.5 3.6 - 10.7 10*3/uL    RBC 4.34 3.80 - 5.20 10*6/uL    Hemoglobin 11.4 (L) 11.7 - 16.0 g/dL    Hematocrit 34.9 (L) 35.0 - 47.0 %    MCV 80.3 79.0 - 98.0 fL    MCH 26.2 26.0 - 34.0 pg    MCHC 32.6 32.0 - 36.0 %    RDW 13.9 11.5 - 14.5 %    Platelets 232 140 - 440 10*3/uL    MPV 9.8 7.4 - 10.4 fL    Granulocytes % 56.4 %    Lymphocyte % 32.9 %    Monocytes 6.2 %    Eosinophils 3.7 %    Basophils 0.8 %    Absolute Neut # 4.2 1.8 - 7.0 10*3/uL    Absolute Lymph # 2.5 1.0 - 4.3 10*3/uL    Absolute Mono # 0.5 0.0 - 0.8 10*3/uL    Absolute Eos # 0.3 0.0 - 0.5 10*3/uL    Absolute Baso # 0.1 0.0 - 0.2 25*4/YH   Basic Metabolic Panel    Collection Time: 07/23/15  4:05 PM   Result Value Ref Range    Sodium 141 135 - 145 mmol/L    Potassium 3.6 3.5 - 5.1 mmol/L    Chloride 109 98 - 109 mmol/L    CO2 25 21 - 32 mmol/L    Anion Gap 7 NA    Glucose 97 70 - 100 mg/dL    BUN 13 7 - 25 mg/dL    CREATININE 0.59 0.55 - 1.40 mg/dL    eGFR African American >60.0 >60 mL/min    EGFR IF NonAfrican American >60.0 >60 mL/min    Calcium 8.4 8.2 - 10.1 mg/dL   HCG, Quantitative, Pregnancy    Collection Time: 07/23/15  4:05 PM   Result Value Ref Range    hCG Quant 363 (A) <3 m[IU]/mL       Pelvic pain       Report        CLINICAL INFORMATION: Positive pregnancy test. Pelvic pain.  Transabdominal and transvaginal (for an improved evaluation of the        endometrium and ovaries) pelvic ultrasound is provided. There are no        comparison studies at this institution.                FINDINGS: The uterus is anteflexed and measures 10.3 x 5.3 x 6.8 cm.        The endometrial stripe spans 11 mm. A small amount of endometrial        fluid is present. There is no sonographic evidence of an intrauterine        pregnancy. No fetal pole is identified. No heart flicker is seen.        There is no gestational sac. No uterine parenchymal abnormalities are        seen. The right ovary measures 3.8 x 1.7 x 2.2 cm. A  complex, probably        cystic structure is noted in the region of the right adnexa. This        measures proximal 1.5 cm in greatest diameter. This is nonspecific.        The left ovary measures 3.2 x 2.0 x 1.6 cm. Normal vascular waveforms        are seen in both ovaries. There is no significant free fluid.                IMPRESSION:         1. Small amount of free fluid in the endometrial canal. An        intrauterine pregnancy is not appreciated.        2. Complex cystic structure in the right ovary/adnexa. This is        nonspecific. An ectopic pregnancy is not entirely excluded. Serial        beta-hCG measurements and/or pelvic ultrasounds may be required.                The above findings were discussed with Dr. Erskin Burnet in the Emergency        Room by phone at the time of the examination (July 23, 2015 at        1700 hrs).                Report Dictated on Workstation: [id number]       ASSESSMENT:      29 y.o. No obstetric history on file.    1) Ectopic pregnancy     PLAN:  Patient is currently hemodynamically stable. She was given counseling on her options for management of ectopic pregnancy including expectant management vs MTX therapy with close follow up of BhCG levels to zero and surgery. Patient is expressing desire for outpatient patient of BhCG levels without methotrexate therapy. She was given thorough counseling by myself and Dr. Jeryl Columbia regarding the importance of compliance with follow up quant levels and the possibility of rupture which would require urgent attention and surgery. She expressed understanding of this and states she will follow up at [facility]; she was told to make an appointment for Monday. She was given return to ER precautions including severe pain and diffuse vaginal bleeding >1 pad/hour x2-3 hours.     D/w Dr. Jeryl Columbia and Dr. Gerhard Munch

## 2015-07-26 ENCOUNTER — Encounter

## 2015-07-26 LAB — HCG, QUANTITATIVE, PREGNANCY: hCG Quant: 270 m[IU]/mL — AB (ref <3)

## 2015-08-06 NOTE — Telephone Encounter (Signed)
Pt needs quant follow up.  No phone number in epic or plato seen.  Please try to contact with letter to acquire information.  Stress need for quant follow up.

## 2015-08-10 NOTE — Telephone Encounter (Signed)
Letter sent

## 2015-08-13 NOTE — Telephone Encounter (Signed)
Letter sent

## 2015-08-13 NOTE — Telephone Encounter (Signed)
Please send certified letter to patient requesting appt and updated contact information to follow up for HCG level for patient.  Numbers in plato/epic, including emergency contact are either non existent or not receiving phone calls.

## 2015-08-28 NOTE — Telephone Encounter (Signed)
PLease call and reschedule. History of Tubal with Pregnancy of unknown origin, needs HCG follow up.  Records show regular letter and possibly Certified letter. If no certified letter sent please send.

## 2015-09-03 LAB — CBC
Hematocrit: 41.1 % (ref 35.0–47.0)
Hemoglobin: 13.6 g/dL (ref 11.7–16.0)
MCH: 26.6 pg (ref 26.0–34.0)
MCHC: 33.1 % (ref 32.0–36.0)
MCV: 80.4 fL (ref 79.0–98.0)
MPV: 10.6 fL — ABNORMAL HIGH (ref 7.4–10.4)
Platelets: 222 10*3/uL (ref 140–440)
RBC: 5.12 10*6/uL (ref 3.80–5.20)
RDW: 13.7 % (ref 11.5–14.5)
WBC: 9.9 10*3/uL (ref 3.6–10.7)

## 2015-09-03 LAB — URINALYSIS
Bilirubin, Urine: NEGATIVE NA
Ketones, Urine: NEGATIVE mg/dL
LEUKOCYTES, UA: NEGATIVE NA
Nitrite, Urine: NEGATIVE NA
Occult Blood,Urine: 250 {RBC}/uL
Specific Gravity, Urine: 1.015 NA (ref 1.005–1.030)
Total Protein, Urine: NEGATIVE mg/dL
pH, Urine: 5 NA (ref 5.0–8.0)

## 2015-09-03 LAB — PREGNANCY, URINE: HCG Urine: NEGATIVE NA

## 2015-09-03 NOTE — ED Provider Notes (Signed)
PATIENTGIANNAMARIE, [name]         DOS:           09/03/2015  MR #:             [id number]             ACCOUNT #:     [id number]  DATE OF BIRTH:    January 02, 1986              AGE:           29      HISTORY OF PRESENT ILLNESS:    PERTINENT HISTORY OF PRESENT  ILLNESS. Patient was seen and  evaluated with  Dr. Arie Sabina.  Patient is a 29 year old female presents with heavy  vaginal  bleeding that began yesterday.  The patient has been having lower  abdominal  pain worsening left lower quadrant for one week.  The patient was told  one week  ago that she had a hemorrhagic cyst on both sides and was told  increased pain  if the cyst burst.  Patient states she was given Percocet at that time  which  has run out.  Patient saw taking ibuprofen is really medication at  home.  Patient denies any chest pain, short of breath, nausea, vomiting,  abdominal  pain or other symptoms.  Patient notes that 6 weeks ago she had a  surgically  removed ectopic pregnancy.  The patient is not taking any home  pregnancy test.  Patient states she does have increased frequency of urine.    PERTINENT PAST/ FAMILY/SOCIAL HISTORY Tubal ligation  ovarian cyst removal  ectopic pregnancy 6 weeks ago        PHYSICAL EXAM General: No acute distress, vital signs within normal  limits,  afebrile  HEENT: Normocephalic atraumatic, pupils equal round reactive to light,  EOMI,  moist mucus membranes  CVS:  Regular Rate rhythm, no murmurs rubs or gallops  Respiratory: Lungs clear to auscultation bilaterally  Abdomen: Soft, No tenderness to palpation, normal bowel sounds  genitourinary:  Dark old blood in the vaginal vault, no active  bleeding, no  cervical motion tenderness, no obvious lesions or lacerations in the  vaginal  canal, no noted discharge besides old blood.  Extremities:  nontender no edema  Neuro: AAO x3, no gross focal deficits    MEDICAL DECISION MAKING:    SIGNIFICANT FINDINGS/ED COURSE/MEDICAL DECISION MAKING/TREATMENT  PLAN Given  the  patient had a recent diagnosis of large hemorrhagic cysts there is  concern  for worsening bleeding.  The patient had a normal complete blood  count, BMP,  urinalysis and negative urine pregnancy.  The patient had an  ultrasound  performed which revealed small ovarian cysts.  The patient had no  significant  bleeding and hemodynamically stable.  The patient was given one dose  of  Percocet here for her pain.  On reevaluation the patient was seen and  sitting  comfortably and eating pizza.  On evaluation of the patient's OAARS  report is  found that she has had 6 different providers this last month for  oxycodone/Percocet.  Patient has been seen at [facility], [facility] and  this  facility for multiple complaints.  The patient was instructed she  should  establish one provider for narcotics and a women's health group as  needed.  Patient was offered nonsteroidal anti-inflammatory drugs which she  refused.  Patient was discharged home with follow  up instructions to [facility] as needed.      PROBLEM LIST:         ED Diagnosis:     Vaginal bleeding (N93.9): Entered Date: 03-Sep-2015 20:54, Entered  By:  Loistine SimasFREDRICKSON, Terrence Pizana B, Status: Active, ICD-10: N93.9     Cyst of ovary (N83.20): Entered Date: 03-Sep-2015 20:54, Entered  By:  Loistine SimasFREDRICKSON, Autymn Omlor B, Status: Active, ICD-10: N83.20        ADDITIONAL INFORMATION The Emergency Medicine attending physician  was present  in the Emergency Department, who reviewed case management, and  approved  evaluation/treatment.  This report was generated using a computer driven dictation system.  This  system results in transcription errors.  Every effort is made to  identify and  correct errors, however errors still occur.      COPIES SENT TO::     NO, PCP DOCTOR(PCP): [phone]    Electronic Signatures:  Christella ScheuermannBLANTON, JOSHUA (MD)  (Signed 22-Nov-2015 12:51)   Authored: HISTORY OF PRESENT ILLNESS, MEDICAL DECISION MAKING,  PROBLEM LIST   Co-Signer: HISTORY OF PRESENT ILLNESS, PHYSICAL  EXAM, MEDICAL  DECISION MAKING,  PROBLEM LIST, Additional Infomation, Copies to be sent to:  Loistine SimasFREDRICKSON, Osha Rane B (MD)  (Signed 04-Sep-2015 00:00)   Authored: HISTORY OF PRESENT ILLNESS, PHYSICAL EXAM, MEDICAL DECISION  MAKING,  PROBLEM LIST, Additional Infomation, Copies to be sent to:      Last Updated: 22-Nov-2015 12:51 by Christella ScheuermannBLANTON, JOSHUA (MD)            Please see T-Sheet, initial assessment, and physician orders for  further details.    Dictating Physician: Merlyn AlbertEric B Shakil Dirk, MD  Original Electronic Signature Date: 09/03/2015 07:39 P  EBF  Document #: [id number]    cc:  PCP No       Soarian

## 2015-09-04 LAB — URINALYSIS WITH MICROSCOPIC

## 2015-09-04 LAB — BASIC METABOLIC PANEL
Anion Gap: 10 NA
BUN: 12 mg/dL (ref 7–25)
CO2: 24 mmol/L (ref 21–32)
Calcium: 8.5 mg/dL (ref 8.2–10.1)
Chloride: 107 mmol/L (ref 98–109)
Creatinine: 0.45 mg/dL — ABNORMAL LOW (ref 0.55–1.40)
EGFR IF NonAfrican American: >60 mL/min (ref >60)
Glucose: 88 mg/dL (ref 70–100)
Potassium: 3.8 mmol/L (ref 3.5–5.1)
Sodium: 141 mmol/L (ref 135–145)
eGFR African American: >60 mL/min (ref >60)

## 2015-09-04 NOTE — Telephone Encounter (Signed)
Pt presented to ED on 10/21 for vaginal bleeding. Hcg quant was negative. Please schedule follow up appointment for pt to be seen. If already scheduled, please ensure pt reports to appointment. Thanks!

## 2015-09-06 NOTE — Telephone Encounter (Signed)
How soon would you like pt to be seen? Nothing available until next week unless we double book a provider. To [employer].

## 2015-09-06 NOTE — Telephone Encounter (Signed)
Okay to book appointment for next week. Thanks!

## 2015-09-08 NOTE — Telephone Encounter (Signed)
[place]. Could contact [facility] [place]gina Organrey Mabry at [phone].

## 2015-09-08 NOTE — Telephone Encounter (Signed)
No phone number on file. To [name] to advise.

## 2015-09-09 NOTE — Telephone Encounter (Signed)
Called and LVM with contact.

## 2015-09-09 NOTE — Telephone Encounter (Signed)
Name of Caller: [name]     Contact phone number: [phone]    Relationship to Patient: patient    Chief Complaint/Reason for Call: Pt was returning the phone call, to get scheduled an appointment    Best time of day caller can be reached: PM anytime    Did this call require a physician to be paged?: No    If Yes who did you page?     Patient advised that office/PCP has 24-48 business hours to return their call: No

## 2015-09-10 NOTE — Telephone Encounter (Signed)
Pt is schedule for f/u on 11/2 at 8:45AM

## 2015-09-15 ENCOUNTER — Ambulatory Visit: Admit: 2015-09-15 | Discharge: 2015-09-15 | Payer: MEDICAID | Attending: Obstetrics & Gynecology

## 2015-09-15 DIAGNOSIS — R102 Pelvic and perineal pain: Secondary | ICD-10-CM

## 2015-09-15 LAB — HCG, QUANTITATIVE, PREGNANCY: hCG Quant: <1 m[IU]/mL (ref <3)

## 2015-09-15 NOTE — Progress Notes (Signed)
[name]  09/15/2015              29 y.o.  Chief Complaint   Patient presents with   ??? Follow-up     ER for cysts, one ruptured. large one on left side, stopped bleeding but still having left side pain, would like refill of pain medication         No LMP recorded.           Primary Care Physician: No primary care provider on file.  HPI:   [name] is a 29 y.o. female G5P4 who presents for fu of ectopic pregnancy. She ultimately reports that she had R salpingectomy at [facility] on 07/30/2015. Has had persistent pelvic pain since. Went to [facility] ED on 09/03/2015 for pelvic pain and comes in today because she is concerned they told her about an ovarian cyst. Had hx of BTL before this ectopic Did have vaginal bleeding for last nine days that ended two weeks ago. Does have intermittent left leg pain that shoots down the front of her leg.  Does have some vaginal discharge. No pain with intercourse. Has been having unprotected intercourse. Would like depo provera for havy bleeding.       IMPRESSIONS:    1. Small amount of free fluid dependent pelvis    ??   2. Small rounded echogenic lesions both ovaries (2.5 cm on the    left-hand and 1.8 cm on the right) ??which could represent hemorrhagic    cysts but are nonspecific. The left ovary was negative on exam of    07/23/2015 and a right adnexal ectopic pregnancy was suspected at that    time    ??         OB History   No data available     No past medical history on file.                                                                No past surgical history on file.  No family history on file.  Social History     Social History   ??? Marital status: Single     Spouse name: N/A   ??? Number of children: N/A   ??? Years of education: N/A     Occupational History   ??? Not on file.     Social History Main Topics   ??? Smoking status: Light Tobacco Smoker   ??? Smokeless tobacco: Not on file   ??? Alcohol use Not on file   ??? Drug use: Not on file   ??? Sexual activity: Not on file     Other  Topics Concern   ??? Not on file     Social History Narrative   ??? No narrative on file         MEDICATIONS:  Current Outpatient Prescriptions   Medication Sig Dispense Refill   ??? oxyCODONE-acetaminophen (PERCOCET) 5-325 MG per tablet TK 1 TO 2 T PO Q 6 HOURS PRN FOR PAIN  0   ??? ibuprofen (ADVIL;MOTRIN) 800 MG tablet take 1 tablet by mouth every 8 hours if needed  0     No current facility-administered medications for this visit.        ALLERGIES:  Allergies as  of 09/15/2015 - Review Complete 09/15/2015   Allergen Reaction Noted   ??? Bactrim [sulfamethoxazole-trimethoprim]  09/15/2015   ??? Naproxen  09/15/2015       Review of Systems:         Review of Systems   Constitutional: Negative for activity change, appetite change and fever.   Respiratory: Negative.    Cardiovascular: Negative.    Gastrointestinal: Negative.    Genitourinary: Negative.    Musculoskeletal: Positive for myalgias. Negative for arthralgias, back pain, gait problem, joint swelling, neck pain and neck stiffness.       Physical Exam:    Visit Vitals   ??? BP 128/84 (Site: Left Arm, Position: Sitting)   ??? Pulse 78   ??? Wt 173 lb 6.4 oz (78.7 kg)        Physical Exam   Constitutional: She is oriented to person, place, and time. She appears well-developed and well-nourished.   Pulmonary/Chest: Effort normal and breath sounds normal.   Abdominal: Soft. She exhibits no distension and no mass. There is no tenderness. There is no rebound and no guarding.   Genitourinary: Vagina normal and uterus normal.   Genitourinary Comments: White creamy vaginal discharge noted.   Neurological: She is alert and oriented to person, place, and time.   Skin: Skin is warm and dry.       Recent Results (from the past 1008 hour(s))   Urinalysis    Collection Time: 09/03/15  7:15 PM   Result Value Ref Range    Appearance clear Clear NA    Color, UA yellow Lt. Yellow NA    Specific Gravity, Urine 1.015 1.005 - 1.030 NA    pH, Urine 5.0 5.0 - 8.0 NA    LEUKOCYTES, UA NEG Negative NA     Nitrite, Urine NEG Negative NA    Total Protein, Urine NEG Negative mg/dL    Glucose, Ur NORM Negative mg/dL    Ketones, Urine NEG Negative mg/dL    Urobilinogen, Urine NORM 0 - 1 mg/dL    Bilirubin, Urine NEG Negative NA    Occult Blood,Urine 250 Negative [RBC]/uL   Pregnancy, Urine    Collection Time: 09/03/15  7:15 PM   Result Value Ref Range    HCG Urine Negative Negative NA   Urinalysis with Microscopic    Collection Time: 09/03/15  7:15 PM   Result Value Ref Range    WBC, UA 0-2 0 - 5 /[HPF]    RBC, UA 3-5 0 - 2 /[HPF]    Epithelial Cells, Wet Prep 0-2 3 - 5 /[HPF]    Bacteria, UA Moderate (6-50) Negative NA   CBC    Collection Time: 09/03/15  7:37 PM   Result Value Ref Range    WBC 9.9 3.6 - 10.7 10*3/uL    RBC 5.12 3.80 - 5.20 10*6/uL    Hemoglobin 13.6 11.7 - 16.0 g/dL    Hematocrit 41.1 35.0 - 47.0 %    MCV 80.4 79.0 - 98.0 fL    MCH 26.6 26.0 - 34.0 pg    MCHC 33.1 32.0 - 36.0 %    RDW 13.7 11.5 - 14.5 %    Platelets 222 140 - 440 10*3/uL    MPV 10.6 (H) 7.4 - 10.4 fL   Basic Metabolic Panel    Collection Time: 09/03/15  7:37 PM   Result Value Ref Range    Sodium 141 135 - 145 mmol/L    Potassium 3.8 3.5 - 5.1 mmol/L  Chloride 107 98 - 109 mmol/L    CO2 24 21 - 32 mmol/L    Anion Gap 10 NA    Glucose 88 70 - 100 mg/dL    BUN 12 7 - 25 mg/dL    CREATININE 0.45 (L) 0.55 - 1.40 mg/dL    eGFR African American >60.0 >60 mL/min    EGFR IF NonAfrican American >60.0 >60 mL/min    Calcium 8.5 8.2 - 10.1 mg/dL   ]    ASSESSMENT:      29 y.o. No obstetric history on file.  1. Pelvic pain in female  HCG, Quantitative, Pregnancy    CANCELED: [place] Non OB Transvaginal   2. History of ectopic pregnancy     3. Vaginal discharge  Vaginal Pathogens Probes *A       PLAN:  1. Hx ectopic  - resolved s/p lx R salpingectomy will get path  And op note from [facility]  - may need L partial salpingectomy since she got pregnant after a tubal  2. AUB - will discuss at next visit, has been on depo in the past and did not like it  3.  Pelvic pain - exam benign today could be irritation from hemorrhagic 2.5cm R ovarian cyst, will follow up in one month  - tylenol and ibuprofen encouraged  4. Vaginal dc  - affirm probe  Return in about 1 month (around 10/15/2015) for fu pelvic pain R ov cyst.    Orders Placed This Encounter   Procedures   ??? HCG, Quantitative, Pregnancy     Standing Status:   Future     Standing Expiration Date:   09/14/2016   ??? Vaginal Pathogens Probes *A     Standing Status:   Future     Standing Expiration Date:   09/14/2016

## 2015-09-15 NOTE — Telephone Encounter (Signed)
i need a gc/ct order put in

## 2015-09-15 NOTE — Addendum Note (Signed)
Addended by: Salvatore DecentESTA, Ardell Makarewicz on: 09/15/2015 04:34 PM     Modules accepted: Orders

## 2015-09-15 NOTE — Addendum Note (Signed)
Addended byRobyn Haber: Oree Hislop on: 09/15/2015 05:21 PM     Modules accepted: Orders

## 2015-09-16 ENCOUNTER — Telehealth

## 2015-09-16 LAB — VAGINAL PATHOGENS PROBE *A
Candida Species, DNA Probe: NEGATIVE
Gardnerella Vaginalis, DNA Probe: POSITIVE — AB
Trichomonas Vaginalis DNA: NEGATIVE

## 2015-09-16 MED ORDER — METRONIDAZOLE 500 MG PO TABS
500 MG | ORAL_TABLET | Freq: Two times a day (BID) | ORAL | 0 refills | Status: AC
Start: 2015-09-16 — End: 2015-09-23

## 2015-09-16 NOTE — Telephone Encounter (Signed)
Please Call patient and let her know that she has bacterial vaginosis. Will need to get her pharmacy so I can send in an Rx

## 2015-09-17 LAB — C. TRACHOMATIS / N. GONORRHOEAE, DNA
C. Trachomatis Amplified: NEGATIVE NA
N. Gonorrhoeae Amplified: NEGATIVE NA

## 2015-09-17 NOTE — Telephone Encounter (Signed)
CAll to patient and phone rang busy

## 2015-09-20 NOTE — Telephone Encounter (Signed)
CAll to patient and phone just rang busy. Letter sent

## 2015-09-28 ENCOUNTER — Telehealth

## 2015-09-28 NOTE — Telephone Encounter (Signed)
Pt called back with pharmacy to have rx called into for BV. She wants it sent to [facility] on [address] in [place]. It is put in pt's chart. TO Althea Charonesta

## 2015-09-29 MED ORDER — METRONIDAZOLE 500 MG PO TABS
500 MG | ORAL_TABLET | Freq: Two times a day (BID) | ORAL | 0 refills | Status: AC
Start: 2015-09-29 — End: 2015-10-06

## 2015-09-29 NOTE — Telephone Encounter (Signed)
Please let pt know that rx is at pharmacy thanks.

## 2015-10-04 NOTE — Telephone Encounter (Signed)
Noted Pt states that she is going to be seen in ER in [name]

## 2015-10-04 NOTE — Telephone Encounter (Signed)
S: Patient called the clinical access center with complaint of abnormal vaginal bleeding and pelvic pain  B: States she was seen in the office 11/2 and was told she had BV,She was prescribed  Terconazole, states she could not take the medication because she is allergic. Two days ago she started having heavy vaginal bleeding and pelvic pain  A: Mild- moderate pelvic pain. She states she is going through two- three pads an hour for the past few hours. She is also passing pea sized clots. She denies fever and feeling lightheaded   R:DISPOSITION: Go to ED Now (or Physician triage) - SEVERE vaginal bleeding (i.e., soaking 2 pads or tampons per hour and present 2 or more hours) Patient instructed to call back with worsening symptoms, concerns or questions. She lives in [place] and [place]

## 2015-10-06 NOTE — Telephone Encounter (Signed)
See other encounter, Spoke to Dr Althea Charonesta- Patient will be dismissed from the practice

## 2015-10-06 NOTE — Telephone Encounter (Signed)
Pt very upset that we cannot prescribe narcotics over the phone. I explained that this is illegal and that she needs to be seen in clinic to be evaluated or if pain is that severe she needs to return to the ED to be evaluated. Pt extremely upset and using harsh language. I offered to refer her to a provider closer to her home, she declines. She will send [facility] for [facility]. Discussed with her that she needs to take her flagyl, she notes she had emesis and does not want to take it. Will take supp[facility] she got [facility].    Please dismiss patient from practice. Will follow for 30 days for emergency care until she finds another provider.

## 2015-10-06 NOTE — Telephone Encounter (Signed)
Pt should not need medication for this. Should be resolved by now, If not controlled with OTC tylenol and ibuprofen, needs to go to ED. Make sure she has taken rx for BV. If pain persists, needs fu appt with resident.

## 2015-10-06 NOTE — Telephone Encounter (Signed)
Pt calling in stating that she needs pain medication called in for her cyst

## 2015-10-06 NOTE — Telephone Encounter (Signed)
CAll to patient and patient is very upset and stating that she was in ER last night and that she is still having pain that is uncontrolled by ibuprofen and tylenol. States that she would like to speak to her physician now and if she does not order her anything "she will go to her superior" Notified Dr Althea Charonesta and she called patient - Encounter sent to Dr Althea Charonesta to close

## 2015-10-20 ENCOUNTER — Encounter: Attending: Obstetrics & Gynecology

## 2015-10-21 ENCOUNTER — Ambulatory Visit
Admit: 2015-10-21 | Discharge: 2015-10-21 | Payer: PRIVATE HEALTH INSURANCE | Attending: Student in an Organized Health Care Education/Training Program

## 2015-10-21 DIAGNOSIS — Z309 Encounter for contraceptive management, unspecified: Secondary | ICD-10-CM

## 2015-10-21 LAB — POCT URINE PREGNANCY: Preg Test, Ur: NEGATIVE

## 2015-10-21 MED ORDER — MEDROXYPROGESTERONE ACETATE 150 MG/ML IM SUSP
150 MG/ML | Freq: Once | INTRAMUSCULAR | Status: AC
Start: 2015-10-21 — End: 2015-10-21
  Administered 2015-10-21: 16:00:00 150 mg via INTRAMUSCULAR

## 2015-10-21 NOTE — Telephone Encounter (Signed)
Please see other encounter from Dr. Althea Charonesta, Dr. Ripley Fraiselemons wanted to know how far along we were with dismissing this patient from the practice. Pt was seen today 10/21/15 and was pleasant with Dr. Ripley Fraiselemons but she was unsure if pt had heard anything about being dismissed.

## 2015-10-21 NOTE — Progress Notes (Signed)
[name]  10/21/2015              29 y.o.  Chief Complaint   Patient presents with   ??? Follow-up         No LMP recorded.           Primary Care Physician: No primary care provider on file.  HPI:   [name] is a 29 y.o. female presents as ED follow-up. She has been seen in multiple EDs over the past  month for foul-smelling brown discharge. She states her symptoms have not resolved. Reports she has not used any new perfumes, deodorants, detergents; not douching; no new partners. She brought her results from her most recent ED visit to [facility] on 12/5. CBC, CMP, UA, Abdominal CT, and Wet prep were all wnl. She has previously been treated for BV in 09/2015.    She does have a h/o failed BTL with a subsequent ectopic pregnancy in 07/2015. She was treated medically with methotrexate and had a negative HCG quant in 11/2. She desires depo provera for supplemental contraception.    OB History   No data available     History reviewed. No pertinent past medical history.                                                                History reviewed. No pertinent past surgical history.  History reviewed. No pertinent family history.  Social History     Social History   ??? Marital status: Single     Spouse name: N/A   ??? Number of children: N/A   ??? Years of education: N/A     Occupational History   ??? Not on file.     Social History Main Topics   ??? Smoking status: Light Tobacco Smoker   ??? Smokeless tobacco: Not on file   ??? Alcohol use Not on file   ??? Drug use: Not on file   ??? Sexual activity: Not on file     Other Topics Concern   ??? Not on file     Social History Narrative         MEDICATIONS:  Current Outpatient Prescriptions   Medication Sig Dispense Refill   ??? oxyCODONE-acetaminophen (PERCOCET) 5-325 MG per tablet TK 1 TO 2 T PO Q 6 HOURS PRN FOR PAIN  0   ??? ibuprofen (ADVIL;MOTRIN) 800 MG tablet take 1 tablet by mouth every 8 hours if needed  0     No current facility-administered medications for this  visit.        ALLERGIES:  Allergies as of 10/21/2015 - Review Complete 10/21/2015   Allergen Reaction Noted   ??? Bactrim [sulfamethoxazole-trimethoprim]  09/15/2015   ??? Naproxen  09/15/2015       Review of Systems:         Review of Systems  Denies HA, CP, SOB, N/V, F/V, abdominal pain, C/D, rashes  +vaginal discharge, +spotting    Physical Exam:    Visit Vitals   ??? BP 130/86 (Site: Left Arm, Position: Sitting, Cuff Size: Medium Adult)   ??? Pulse 87   ??? Wt 178 lb (80.7 kg)        Physical Exam  General: NAD, appears comfortable.   HEENT: NCAT.  EOMI. No conjunctival injection or icterus  Abdomen: soft, NDNT  Skin: warm, dry, no lesions  Psych: appropriate affect. Behavior somewhat aggressive at times.  GU: deferred      Recent Results (from the past 1008 hour(s))   Vaginal Pathogens Probes *A    Collection Time: 09/15/15 12:00 AM   Result Value Ref Range    CANDIDA SPECIES, DNA PROBE Negative     GARDNERELLA VAGINALIS, DNA PROBE POSITIVE (A)     Trichomonas Vaginalis DNA Negative    HCG, Quantitative, Pregnancy    Collection Time: 09/15/15  3:55 PM   Result Value Ref Range    hCG Quant <1 <3 m[IU]/mL   C. Trachomatis / N. Lizabeth LeydenGonorrhoeae, DNA Probe    Collection Time: 09/15/15  6:02 PM   Result Value Ref Range    Source Endocervix NA    C. Trachomatis Amplified Negative NA    N. Gonorrhoeae Amplified Negative NA   POCT urine pregnancy    Collection Time: 10/21/15 11:18 AM   Result Value Ref Range    Preg Test, Ur Negative     Control     ]    ASSESSMENT:      29 y.o.   1. Encounter for contraceptive management  POCT urine pregnancy   2. Vaginal discharge         PLAN:  - Vaginal discharge: Wet prep wnl 3 days ago. Previously treated for BV last month. Discussed good hygiene practices.  - Contraception: H/o failed BTL. HCG qualitative negative on 12/5 and here today. Depo provera administered in office today.  - Chart reviewed. Multiple telephone encounters describe the patient's rude interactions with other providers and  office staff. A request from another provider to dismiss the patient from the practice was placed; however, I am unsure if a formal letter has been sent. I will send a request to the front desk to send this formal letter.    Return if symptoms worsen or fail to improve.    Orders Placed This Encounter   Procedures   ??? POCT urine pregnancy

## 2015-11-14 NOTE — Telephone Encounter (Signed)
Patient states she is at [facility] ED and is having to wait to be checked out.  States [facility] and given antibiotics for BV and "pelvic bleeding", but that blood is now bright red and she is having a lot of pain with nausea.  She is asking if she should stay to be seen.    Discussed with patient that I cannot tell her what to do, but if she is already at a facility and is having abdominal pain, nothing more can be done for her over the phone.  She stated understanding.

## 2016-05-20 ENCOUNTER — Encounter (HOSPITAL_COMMUNITY): Payer: Self-pay | Admitting: *Deleted

## 2016-08-09 ENCOUNTER — Emergency Department (HOSPITAL_COMMUNITY): Payer: Medicaid Other

## 2016-08-09 ENCOUNTER — Encounter (HOSPITAL_COMMUNITY): Payer: Self-pay | Admitting: *Deleted

## 2016-08-09 ENCOUNTER — Emergency Department (HOSPITAL_COMMUNITY)
Admission: EM | Admit: 2016-08-09 | Discharge: 2016-08-09 | Disposition: A | Discharge status: Home / Self Care | Payer: Medicaid Other | Attending: Emergency Medicine | Admitting: Emergency Medicine

## 2016-08-09 DIAGNOSIS — R1030 Lower abdominal pain, unspecified: Secondary | ICD-10-CM | POA: Diagnosis present

## 2016-08-09 DIAGNOSIS — N730 Acute parametritis and pelvic cellulitis: Secondary | ICD-10-CM

## 2016-08-09 DIAGNOSIS — R52 Pain, unspecified: Secondary | ICD-10-CM

## 2016-08-09 DIAGNOSIS — Z87891 Personal history of nicotine dependence: Secondary | ICD-10-CM | POA: Diagnosis not present

## 2016-08-09 DIAGNOSIS — N739 Female pelvic inflammatory disease, unspecified: Secondary | ICD-10-CM | POA: Insufficient documentation

## 2016-08-09 DIAGNOSIS — R102 Pelvic and perineal pain: Secondary | ICD-10-CM

## 2016-08-09 LAB — BASIC METABOLIC PANEL
Anion gap: 4 — ABNORMAL LOW (ref 5–15)
BUN: 6 mg/dL (ref 6–20)
CO2: 29 mmol/L (ref 22–32)
Calcium: 8.9 mg/dL (ref 8.9–10.3)
Chloride: 106 mmol/L (ref 101–111)
Creatinine, Ser: 0.65 mg/dL (ref 0.44–1.00)
GFR calc Af Amer: >60 mL/min (ref >60)
GFR calc non Af Amer: >60 mL/min (ref >60)
Glucose, Bld: 98 mg/dL (ref 65–99)
Potassium: 3.9 mmol/L (ref 3.5–5.1)
Sodium: 139 mmol/L (ref 135–145)

## 2016-08-09 LAB — CBC WITH DIFFERENTIAL/PLATELET
Basophils Absolute: 0 10*3/uL (ref 0.0–0.1)
Basophils Relative: 0 %
Eosinophils Absolute: 0.3 10*3/uL (ref 0.0–0.7)
Eosinophils Relative: 3 %
HCT: 39.3 % (ref 36.0–46.0)
Hemoglobin: 13.1 g/dL (ref 12.0–15.0)
Lymphocytes Relative: 36 %
Lymphs Abs: 3.1 10*3/uL (ref 0.7–4.0)
MCH: 25.9 pg — ABNORMAL LOW (ref 26.0–34.0)
MCHC: 33.3 g/dL (ref 30.0–36.0)
MCV: 77.8 fL — ABNORMAL LOW (ref 78.0–100.0)
Monocytes Absolute: 0.6 10*3/uL (ref 0.1–1.0)
Monocytes Relative: 7 %
Neutro Abs: 4.7 10*3/uL (ref 1.7–7.7)
Neutrophils Relative %: 54 %
Platelets: 241 10*3/uL (ref 150–400)
RBC: 5.05 MIL/uL (ref 3.87–5.11)
RDW: 14.4 % (ref 11.5–15.5)
WBC: 8.7 10*3/uL (ref 4.0–10.5)

## 2016-08-09 LAB — WET PREP, GENITAL
Clue Cells Wet Prep HPF POC: NONE SEEN
Sperm: NONE SEEN
Trich, Wet Prep: NONE SEEN
Yeast Wet Prep HPF POC: NONE SEEN

## 2016-08-09 LAB — URINALYSIS, ROUTINE W REFLEX MICROSCOPIC
Bilirubin Urine: NEGATIVE
Glucose, UA: NEGATIVE mg/dL
Hgb urine dipstick: NEGATIVE
Ketones, ur: NEGATIVE mg/dL
Leukocytes, UA: NEGATIVE
Nitrite: NEGATIVE
Protein, ur: NEGATIVE mg/dL
Specific Gravity, Urine: 1.024 (ref 1.005–1.030)
pH: 7.5 (ref 5.0–8.0)

## 2016-08-09 LAB — POC URINE PREG, ED: Preg Test, Ur: NEGATIVE

## 2016-08-09 LAB — RAPID HIV SCREEN (HIV 1/2 AB+AG)
HIV 1/2 Antibodies: NONREACTIVE
HIV-1 P24 Antigen - HIV24: NONREACTIVE

## 2016-08-09 MED ORDER — STERILE WATER FOR INJECTION IJ SOLN
INTRAMUSCULAR | Status: AC
  Administered 2016-08-09: 10 mL
  Filled 2016-08-09: qty 10

## 2016-08-09 MED ORDER — AZITHROMYCIN 250 MG PO TABS
1000.0000 mg | ORAL_TABLET | Freq: Once | ORAL | Status: AC
  Administered 2016-08-09: 1000 mg via ORAL
  Filled 2016-08-09: qty 4

## 2016-08-09 MED ORDER — HYDROCODONE-ACETAMINOPHEN 5-325 MG PO TABS
1.0000 | ORAL_TABLET | Freq: Once | ORAL | Status: AC
  Administered 2016-08-09: 1 via ORAL
  Filled 2016-08-09: qty 1

## 2016-08-09 MED ORDER — DOXYCYCLINE HYCLATE 100 MG PO CAPS: 100.0000 mg | ORAL_CAPSULE | Freq: Two times a day (BID) | ORAL | 0 refills | Status: DC

## 2016-08-09 MED ORDER — CEFTRIAXONE SODIUM 250 MG IJ SOLR
250.0000 mg | Freq: Once | INTRAMUSCULAR | Status: AC
  Administered 2016-08-09: 250 mg via INTRAMUSCULAR
  Filled 2016-08-09: qty 250

## 2016-08-09 MED ORDER — ONDANSETRON 4 MG PO TBDP
4.0000 mg | ORAL_TABLET | Freq: Once | ORAL | Status: AC
  Administered 2016-08-09: 4 mg via ORAL
  Filled 2016-08-09: qty 1

## 2016-08-09 NOTE — ED Notes (Signed)
Pt refuses to go to [facility] until she receives pain medication. Pt is requesting to speak with "someone in charge", Johnston EbbsKoula, RN called. Dr. Manus Gunningancour notified.

## 2016-08-09 NOTE — ED Notes (Signed)
Pt ambulated from waiting area.

## 2016-08-09 NOTE — ED Notes (Signed)
Pt is in stable condition upon d/c and ambulates from ED. 

## 2016-08-09 NOTE — Discharge Instructions (Signed)
FOllow up with Dr. Clearance CootsHarper. Have your sexual partners be treated. Return to the ED if you develop new or worsening symptoms.

## 2016-08-09 NOTE — ED Provider Notes (Signed)
[facility]-EMERGENCY DEPT Provider Note   CSN: [id number] Arrival date & time: 08/09/16  1150     History   Chief Complaint Chief Complaint  Patient presents with  . Abdominal Pain  . Vaginal Discharge    HPI Leah Barry is a 30 y.o. female.  Patient presents with a three-day history of lower abdominal pain, vaginal pain, vaginal discharge and burning with urination. Reports she is sexually active. Endorses browning clear vaginal discharge with dysuria and itching. Denies any bleeding. States she has not had a period for "a while". History of tubal ligation in the past with ectopic pregnancy. Denies any vomiting or diarrhea. Denies any fever. Denies any chest pain or shortness of breath.   The history is provided by the patient.  Abdominal Pain   Associated symptoms include nausea, dysuria and frequency. Pertinent negatives include fever, vomiting, hematuria, headaches and arthralgias.  Vaginal Discharge   Associated symptoms include abdominal pain, nausea, dysuria and frequency. Pertinent negatives include no fever and no vomiting.    Past Medical History:  Diagnosis Date  . Anxiety   . Apnea, sleep    no CPAP use, states lost machine during move to [place]  . Bipolar 1 disorder (HCC)   . Bipolar disorder (HCC)    no current med.  . Depression   . History of MRSA infection 2010  . Irritable bowel syndrome (IBS)    no current med.  . Pelvic inflammatory disease   . Scalp laceration    staples to be removed 03/13/2014  . Ulnar fracture 03/09/2014   left    Patient Active Problem List   Diagnosis Date Noted  . Dysmenorrhea 05/28/2014  . GC (gonococcus) 05/28/2014  . Chlamydia contact 05/28/2014  . Pain 04/14/2014  . UTI symptoms 04/14/2014  . Routine general medical examination at a health care facility 09/26/2013  . Candidiasis of vulva and vagina 09/26/2013  . Unspecified symptom associated with female genital organs 09/10/2013  . Abnormal uterine bleeding (AUB)  09/10/2013  . PID (pelvic inflammatory disease) 07/29/2013  . Dysuria 07/29/2013  . Screening examination for venereal disease 07/29/2013  . Chronic PID (chronic pelvic inflammatory disease) 06/30/2013  . Excessive or frequent menstruation 06/30/2013  . Genital herpes, unspecified 06/30/2013  . Unspecified inflammatory disease of female pelvic organs and tissues 03/12/2013    Past Surgical History:  Procedure Laterality Date  . CYST EXCISION     middle of back  . ORIF ULNAR FRACTURE Left 03/16/2014   Procedure: OPEN REDUCTION INTERNAL FIXATION (ORIF) LEFT ULNAR FRACTURE;  Surgeon: Marlowe Shores, MD;  Location: [facility];  Service: Orthopedics;  Laterality: Left;  left  . OVARIAN CYST REMOVAL    . THYROID CYST EXCISION     nodule exc.  . TUBAL LIGATION      OB History    Gravida Para Term Preterm AB Living   5 4 4     4    SAB TAB Ectopic Multiple Live Births           4       Home Medications    Prior to Admission medications   Medication Sig Start Date End Date Taking? Authorizing Provider  acetaminophen (TYLENOL) 325 MG tablet Take 650 mg by mouth every 6 (six) hours as needed for moderate pain or headache.    Historical Provider, MD    Family History Family History  Problem Relation Age of Onset  . Heart disease Mother   . Cancer Maternal  Grandmother     breast  . Cancer Paternal Grandmother     breast    Social History Social History  Substance Use Topics  . Smoking status: Former Smoker    Years: 3.00    Types: Cigarettes  . Smokeless tobacco: Never Used     Comment: 1-2 cig./day  . Alcohol use No     Allergies   Contrast media [iodinated diagnostic agents]; Sulfa antibiotics; Toradol [ketorolac tromethamine]; Bee venom; Flagyl [metronidazole]; Naproxen; and Bactrim [sulfamethoxazole-trimethoprim]   Review of Systems Review of Systems  Constitutional: Negative for activity change, appetite change and fever.  Respiratory:  Negative for cough and shortness of breath.   Cardiovascular: Negative for chest pain.  Gastrointestinal: Positive for abdominal pain and nausea. Negative for vomiting.  Genitourinary: Positive for dysuria, frequency, vaginal discharge and vaginal pain. Negative for flank pain and hematuria.  Musculoskeletal: Negative for arthralgias, back pain, neck pain and neck stiffness.  Neurological: Negative for dizziness, weakness and headaches.   A complete 10 system review of systems was obtained and all systems are negative except as noted in the HPI and PMH.    Physical Exam Updated Vital Signs BP 113/71   Pulse 81   Temp 98.4 F (36.9 C) (Oral)   Resp 17   Wt 167 lb (75.8 kg)   SpO2 98%   BMI 26.16 kg/m   Physical Exam  Constitutional: She is oriented to person, place, and time. She appears well-developed and well-nourished. No distress.  anxious  HENT:  Head: Normocephalic and atraumatic.  Mouth/Throat: Oropharynx is clear and moist. No oropharyngeal exudate.  Eyes: Conjunctivae and EOM are normal. Pupils are equal, round, and reactive to light.  Neck: Normal range of motion. Neck supple.  No meningismus.  Cardiovascular: Normal rate, regular rhythm, normal heart sounds and intact distal pulses.   No murmur heard. Pulmonary/Chest: Effort normal and breath sounds normal. No respiratory distress.  Abdominal: Soft. There is tenderness. There is no rebound and no guarding.  Suprapubic tenderness No RLQ tenderness  Genitourinary:  Genitourinary Comments: Chaperone present. Normal external genitalia. Friable cervix with white discharge in the vaginal vault. Positive CMT and left-sided adnexal tenderness  Musculoskeletal: Normal range of motion. She exhibits no edema or tenderness.  Neurological: She is alert and oriented to person, place, and time. No cranial nerve deficit. She exhibits normal muscle tone. Coordination normal.   5/5 strength throughout. CN 2-12 intact.Equal grip  strength.   Skin: Skin is warm.  Psychiatric: She has a normal mood and affect. Her behavior is normal.  Nursing note and vitals reviewed.    ED Treatments / Results  Labs (all labs ordered are listed, but only abnormal results are displayed) Labs Reviewed  WET PREP, GENITAL - Abnormal; Notable for the following:       Result Value   WBC, Wet Prep HPF POC FEW (*)    All other components within normal limits  CBC WITH DIFFERENTIAL/PLATELET - Abnormal; Notable for the following:    MCV 77.8 (*)    MCH 25.9 (*)    All other components within normal limits  BASIC METABOLIC PANEL - Abnormal; Notable for the following:    Anion gap 4 (*)    All other components within normal limits  URINALYSIS, ROUTINE W REFLEX MICROSCOPIC (NOT AT [facility])  RAPID HIV SCREEN (HIV 1/2 AB+AG)  POC URINE PREG, ED  GC/CHLAMYDIA PROBE AMP ([place]) NOT AT [facility]    EKG  EKG Interpretation None  Radiology US Transvaginal Non-ob  Result Date: 08/09/2016 CLINICAL DATA:  Pelvic pain x2 days, history of tubal ligation, ovarian cyst removal, PID, and prior ectopic EXAM: TRANSABDOMINAL AND TRANSVAGINAL ULTRASOUND OF PELVIS DOPPLER ULTRASOUND OF OVARIES TECHNIQUE: Both transabdominal and transvaginal ultrasound examinations of the pelvis were performed. Transabdominal technique was performed for global imaging of the pelvis including uterus, ovaries, adnexal regions, and pelvic cul-de-sac. It was necessary to proceed with endovaginal exam following the transabdominal exam to visualize the right ovary and left adnexa. Color and duplex Doppler ultrasound was utilized to evaluate blood flow to the ovaries. COMPARISON:  07/18/2015 FINDINGS: Uterus Measurements: 9.0 x 5.3 x 6.1 cm. No fibroids or other mass visualized. Endometrium Thickness: 8 mm.  No focal abnormality visualized. Right ovary Measurements: 3.1 x 2.4 x 2.4 cm. Normal appearance/no adnexal mass. Left ovary Not discretely visualized. Pulsed Doppler  evaluation of the right ovary demonstrates normal low-resistance arterial and venous waveforms. Other findings No abnormal free fluid. IMPRESSION: Normal sonographic appearance of the uterus and right ovary. Left ovary is not discretely visualized. No evidence of right ovarian torsion. Electronically Signed   By: Charline Bills M.D.   On: 08/09/2016 17:17   US Pelvis Complete  Result Date: 08/09/2016 CLINICAL DATA:  Pelvic pain x2 days, history of tubal ligation, ovarian cyst removal, PID, and prior ectopic EXAM: TRANSABDOMINAL AND TRANSVAGINAL ULTRASOUND OF PELVIS DOPPLER ULTRASOUND OF OVARIES TECHNIQUE: Both transabdominal and transvaginal ultrasound examinations of the pelvis were performed. Transabdominal technique was performed for global imaging of the pelvis including uterus, ovaries, adnexal regions, and pelvic cul-de-sac. It was necessary to proceed with endovaginal exam following the transabdominal exam to visualize the right ovary and left adnexa. Color and duplex Doppler ultrasound was utilized to evaluate blood flow to the ovaries. COMPARISON:  07/18/2015 FINDINGS: Uterus Measurements: 9.0 x 5.3 x 6.1 cm. No fibroids or other mass visualized. Endometrium Thickness: 8 mm.  No focal abnormality visualized. Right ovary Measurements: 3.1 x 2.4 x 2.4 cm. Normal appearance/no adnexal mass. Left ovary Not discretely visualized. Pulsed Doppler evaluation of the right ovary demonstrates normal low-resistance arterial and venous waveforms. Other findings No abnormal free fluid. IMPRESSION: Normal sonographic appearance of the uterus and right ovary. Left ovary is not discretely visualized. No evidence of right ovarian torsion. Electronically Signed   By: Charline Bills M.D.   On: 08/09/2016 17:17   [place] Art/ven Flow Abd Pelv Doppler  Result Date: 08/09/2016 CLINICAL DATA:  Pelvic pain x2 days, history of tubal ligation, ovarian cyst removal, PID, and prior ectopic EXAM: TRANSABDOMINAL AND TRANSVAGINAL  ULTRASOUND OF PELVIS DOPPLER ULTRASOUND OF OVARIES TECHNIQUE: Both transabdominal and transvaginal ultrasound examinations of the pelvis were performed. Transabdominal technique was performed for global imaging of the pelvis including uterus, ovaries, adnexal regions, and pelvic cul-de-sac. It was necessary to proceed with endovaginal exam following the transabdominal exam to visualize the right ovary and left adnexa. Color and duplex Doppler ultrasound was utilized to evaluate blood flow to the ovaries. COMPARISON:  07/18/2015 FINDINGS: Uterus Measurements: 9.0 x 5.3 x 6.1 cm. No fibroids or other mass visualized. Endometrium Thickness: 8 mm.  No focal abnormality visualized. Right ovary Measurements: 3.1 x 2.4 x 2.4 cm. Normal appearance/no adnexal mass. Left ovary Not discretely visualized. Pulsed Doppler evaluation of the right ovary demonstrates normal low-resistance arterial and venous waveforms. Other findings No abnormal free fluid. IMPRESSION: Normal sonographic appearance of the uterus and right ovary. Left ovary is not discretely visualized. No evidence of right ovarian torsion. Electronically  Signed   By: Charline Bills M.D.   On: 08/09/2016 17:17    Procedures Procedures (including critical care time)  Medications Ordered in ED Medications - No data to display   Initial Impression / Assessment and Plan / ED Course  I have reviewed the triage vital signs and the nursing notes.  Pertinent labs & imaging results that were available during my care of the patient were reviewed by me and considered in my medical decision making (see chart for details).  Clinical Course  Vaginal and lower abdominal pain.  No peritoneal signs.  HCG negative. UA Negative.  Treat for PID with rocephin, zithromax, doxycycline.  US shows no TOA. No torsion. L ovary no visualized. Presentation not consistent with ovarian torsion.  Home with doxycycline. Safe sex practices. Followup with Dr. Clearance Coots. Return  precautions discussed.  Final Clinical Impressions(s) / ED Diagnoses   Final diagnoses:  Pelvic pain in female  PID (acute pelvic inflammatory disease)    New Prescriptions New Prescriptions   No medications on file     Glynn Octave, MD 08/09/16 2007

## 2016-08-09 NOTE — ED Notes (Signed)
Patient transported to Ultrasound 

## 2016-08-09 NOTE — ED Triage Notes (Signed)
C/o lower abd. Pain onset several days ago. C/o vaginal discharge with pain and burning with urination. States she is sexually active without protection.

## 2016-08-10 LAB — GC/CHLAMYDIA PROBE AMP (~~LOC~~) NOT AT ARMC
Chlamydia: NEGATIVE
Neisseria Gonorrhea: NEGATIVE

## 2016-08-21 ENCOUNTER — Encounter (HOSPITAL_COMMUNITY): Payer: Self-pay | Admitting: Emergency Medicine

## 2016-08-21 ENCOUNTER — Emergency Department (HOSPITAL_COMMUNITY)
Admission: EM | Admit: 2016-08-21 | Discharge: 2016-08-21 | Disposition: A | Discharge status: Home / Self Care | Payer: Medicaid Other | Attending: Emergency Medicine | Admitting: Emergency Medicine

## 2016-08-21 ENCOUNTER — Emergency Department (HOSPITAL_COMMUNITY): Payer: Medicaid Other

## 2016-08-21 ENCOUNTER — Encounter: Payer: Self-pay | Admitting: Certified Nurse Midwife

## 2016-08-21 ENCOUNTER — Ambulatory Visit (INDEPENDENT_AMBULATORY_CARE_PROVIDER_SITE_OTHER): Payer: Medicaid Other | Admitting: Certified Nurse Midwife

## 2016-08-21 ENCOUNTER — Other Ambulatory Visit (HOSPITAL_COMMUNITY)
Admission: RE | Admit: 2016-08-21 | Discharge: 2016-08-21 | Disposition: A | Discharge status: Home / Self Care | Payer: Medicaid Other | Source: Ambulatory Visit | Attending: Obstetrics & Gynecology | Admitting: Obstetrics & Gynecology

## 2016-08-21 VITALS — BP 134/85 | HR 78 | Temp 98.1°F | Wt 173.8 lb

## 2016-08-21 DIAGNOSIS — Z87891 Personal history of nicotine dependence: Secondary | ICD-10-CM | POA: Diagnosis not present

## 2016-08-21 DIAGNOSIS — N760 Acute vaginitis: Secondary | ICD-10-CM | POA: Insufficient documentation

## 2016-08-21 DIAGNOSIS — K2920 Alcoholic gastritis without bleeding: Secondary | ICD-10-CM | POA: Insufficient documentation

## 2016-08-21 DIAGNOSIS — R310 Gross hematuria: Secondary | ICD-10-CM | POA: Diagnosis not present

## 2016-08-21 DIAGNOSIS — R1012 Left upper quadrant pain: Secondary | ICD-10-CM | POA: Diagnosis present

## 2016-08-21 DIAGNOSIS — Z113 Encounter for screening for infections with a predominantly sexual mode of transmission: Secondary | ICD-10-CM | POA: Insufficient documentation

## 2016-08-21 DIAGNOSIS — R102 Pelvic and perineal pain: Secondary | ICD-10-CM

## 2016-08-21 DIAGNOSIS — N939 Abnormal uterine and vaginal bleeding, unspecified: Secondary | ICD-10-CM

## 2016-08-21 LAB — I-STAT BETA HCG BLOOD, ED (MC, WL, AP ONLY): I-stat hCG, quantitative: <5 m[IU]/mL (ref <5)

## 2016-08-21 LAB — URINALYSIS, ROUTINE W REFLEX MICROSCOPIC
Bilirubin Urine: NEGATIVE
Glucose, UA: NEGATIVE mg/dL
Ketones, ur: NEGATIVE mg/dL
Nitrite: NEGATIVE
Protein, ur: NEGATIVE mg/dL
Specific Gravity, Urine: >1.03 — ABNORMAL HIGH (ref 1.005–1.030)
pH: 5.5 (ref 5.0–8.0)

## 2016-08-21 LAB — URINE MICROSCOPIC-ADD ON

## 2016-08-21 LAB — CBC
HCT: 36.4 % (ref 36.0–46.0)
Hemoglobin: 12.2 g/dL (ref 12.0–15.0)
MCH: 26.1 pg (ref 26.0–34.0)
MCHC: 33.5 g/dL (ref 30.0–36.0)
MCV: 77.8 fL — ABNORMAL LOW (ref 78.0–100.0)
Platelets: 228 10*3/uL (ref 150–400)
RBC: 4.68 MIL/uL (ref 3.87–5.11)
RDW: 14.8 % (ref 11.5–15.5)
WBC: 7.4 10*3/uL (ref 4.0–10.5)

## 2016-08-21 LAB — COMPREHENSIVE METABOLIC PANEL
ALT: 13 U/L — ABNORMAL LOW (ref 14–54)
AST: 14 U/L — ABNORMAL LOW (ref 15–41)
Albumin: 3.4 g/dL — ABNORMAL LOW (ref 3.5–5.0)
Alkaline Phosphatase: 58 U/L (ref 38–126)
Anion gap: 7 (ref 5–15)
BUN: 12 mg/dL (ref 6–20)
CO2: 24 mmol/L (ref 22–32)
Calcium: 8.4 mg/dL — ABNORMAL LOW (ref 8.9–10.3)
Chloride: 111 mmol/L (ref 101–111)
Creatinine, Ser: 0.52 mg/dL (ref 0.44–1.00)
GFR calc Af Amer: >60 mL/min (ref >60)
GFR calc non Af Amer: >60 mL/min (ref >60)
Glucose, Bld: 100 mg/dL — ABNORMAL HIGH (ref 65–99)
Potassium: 3.6 mmol/L (ref 3.5–5.1)
Sodium: 142 mmol/L (ref 135–145)
Total Bilirubin: 0.4 mg/dL (ref 0.3–1.2)
Total Protein: 5.8 g/dL — ABNORMAL LOW (ref 6.5–8.1)

## 2016-08-21 LAB — LIPASE, BLOOD: Lipase: 32 U/L (ref 11–51)

## 2016-08-21 MED ORDER — ESOMEPRAZOLE MAGNESIUM 40 MG PO CPDR: 40.0000 mg | DELAYED_RELEASE_CAPSULE | Freq: Every day | ORAL | 0 refills | Status: DC

## 2016-08-21 MED ORDER — HYDROMORPHONE HCL 1 MG/ML IJ SOLN
0.5000 mg | Freq: Once | INTRAMUSCULAR | Status: AC
Start: 2016-08-21 — End: 2016-08-21
  Administered 2016-08-21: 0.5 mg via INTRAVENOUS
  Filled 2016-08-21: qty 1

## 2016-08-21 MED ORDER — SODIUM CHLORIDE 0.9 % IV BOLUS (SEPSIS)
1000.0000 mL | Freq: Once | INTRAVENOUS | Status: AC
Start: 2016-08-21 — End: 2016-08-21
  Administered 2016-08-21: 1000 mL via INTRAVENOUS

## 2016-08-21 MED ORDER — PANTOPRAZOLE SODIUM 40 MG PO TBEC
80.0000 mg | DELAYED_RELEASE_TABLET | Freq: Every day | ORAL | Status: DC
  Administered 2016-08-21: 80 mg via ORAL
  Filled 2016-08-21: qty 2

## 2016-08-21 MED ORDER — IBUPROFEN 800 MG PO TABS: 800.0000 mg | ORAL_TABLET | Freq: Three times a day (TID) | ORAL | 1 refills | Status: DC | PRN

## 2016-08-21 MED ORDER — SUCRALFATE 1 G PO TABS: 1.0000 g | ORAL_TABLET | Freq: Three times a day (TID) | ORAL | 0 refills | Status: DC

## 2016-08-21 MED ORDER — FENTANYL CITRATE (PF) 100 MCG/2ML IJ SOLN
50.0000 ug | Freq: Once | INTRAMUSCULAR | Status: AC
  Administered 2016-08-21: 50 ug via INTRAVENOUS
  Filled 2016-08-21: qty 2

## 2016-08-21 MED ORDER — ONDANSETRON HCL 4 MG/2ML IJ SOLN
4.0000 mg | Freq: Once | INTRAMUSCULAR | Status: AC
  Administered 2016-08-21: 4 mg via INTRAVENOUS
  Filled 2016-08-21: qty 2

## 2016-08-21 MED ORDER — DICYCLOMINE HCL 20 MG PO TABS: 20.0000 mg | ORAL_TABLET | Freq: Two times a day (BID) | ORAL | 0 refills | Status: DC

## 2016-08-21 MED ORDER — ONDANSETRON HCL 4 MG PO TABS: 4.0000 mg | ORAL_TABLET | Freq: Three times a day (TID) | ORAL | 0 refills | Status: DC | PRN

## 2016-08-21 NOTE — Progress Notes (Signed)
Patient ID: Leah FatDominique V Situ, female   DOB: 03/27/1986, 30 y.o.   MRN: [id number]  Chief Complaint  Patient presents with  . Gynecologic Exam    Patient complains of vaginal irritation, abnormal bleeding, left side pain; she was told by her Dr. in [place] that her cervix is raw     HPI Leah Barry is a 30 y.o. female.  Here for AUB and left lower pelvic pain.  Patient is abrupt with her responses and rude: wants to see Dr. Clearance CootsHarper.  Educated on need for lab work and Ultrasound.  Patient desires depo shot today.  Cannot do that since she last had sexual intercourse a week ago and is not using birth control currently.  Is on her "period" currently with larger than normal blood flow and lower left pelvic pain.     HPI  Past Medical History:  Diagnosis Date  . Anxiety   . Apnea, sleep    no CPAP use, states lost machine during move to [place]  . Bipolar 1 disorder (HCC)   . Bipolar disorder (HCC)    no current med.  . Depression   . History of MRSA infection 2010  . Irritable bowel syndrome (IBS)    no current med.  . Pelvic inflammatory disease   . Scalp laceration    staples to be removed 03/13/2014  . Ulnar fracture 03/09/2014   left    Past Surgical History:  Procedure Laterality Date  . CYST EXCISION     middle of back  . ORIF ULNAR FRACTURE Left 03/16/2014   Procedure: OPEN REDUCTION INTERNAL FIXATION (ORIF) LEFT ULNAR FRACTURE;  Surgeon: Marlowe ShoresMatthew A Weingold, MD;  Location: [facility];  Service: Orthopedics;  Laterality: Left;  left  . OVARIAN CYST REMOVAL    . THYROID CYST EXCISION     nodule exc.  . TUBAL LIGATION      Family History  Problem Relation Age of Onset  . Heart disease Mother   . Cancer Maternal Grandmother     breast  . Cancer Paternal Grandmother     breast    Social History Social History  Substance Use Topics  . Smoking status: Former Smoker    Years: 3.00    Types: Cigarettes  . Smokeless tobacco: Never Used     Comment: 1-2  cig./day  . Alcohol use 0.0 oz/week    Allergies  Allergen Reactions  . Contrast Media [Iodinated Diagnostic Agents] Itching    Pt developed itching after contrast administration; patient had also received Fentanyl immediately prior to transport to [place]. The itching was without any rash, it was concentrated in the face/nose, and relieved with Benadryl. Possible that itching a side effect of pain medication rather than contrast.  . Sulfa Antibiotics Swelling    SWELLING OF EYES  . Toradol [Ketorolac Tromethamine] Itching  . Bee Venom Swelling    "Bees make my eyes swell."  . Flagyl [Metronidazole] Nausea And Vomiting  . Naproxen Other (See Comments)    NOSE BLEED  . Bactrim [Sulfamethoxazole-Trimethoprim]     Eyes swell    Current Outpatient Prescriptions  Medication Sig Dispense Refill  . dicyclomine (BENTYL) 20 MG tablet Take 1 tablet (20 mg total) by mouth 2 (two) times daily. (Patient not taking: Reported on 08/21/2016) 20 tablet 0  . doxycycline (VIBRAMYCIN) 100 MG capsule Take 1 capsule (100 mg total) by mouth 2 (two) times daily. (Patient not taking: Reported on 08/21/2016) 20 capsule 0  . esomeprazole (NEXIUM)  40 MG capsule Take 1 capsule (40 mg total) by mouth daily. (Patient not taking: Reported on 08/21/2016) 30 capsule 0  . ibuprofen (ADVIL,MOTRIN) 800 MG tablet Take 1 tablet (800 mg total) by mouth every 8 (eight) hours as needed. 60 tablet 1  . ondansetron (ZOFRAN) 4 MG tablet Take 1 tablet (4 mg total) by mouth every 8 (eight) hours as needed for nausea or vomiting. (Patient not taking: Reported on 08/21/2016) 10 tablet 0  . sucralfate (CARAFATE) 1 g tablet Take 1 tablet (1 g total) by mouth 4 (four) times daily -  with meals and at bedtime. (Patient not taking: Reported on 08/21/2016) 30 tablet 0   No current facility-administered medications for this visit.     Review of Systems Review of Systems Constitutional: negative for fatigue and weight loss Respiratory: negative  for cough and wheezing Cardiovascular: negative for chest pain, fatigue and palpitations Gastrointestinal: negative for abdominal pain and change in bowel habits Genitourinary:negative Integument/breast: negative for nipple discharge Musculoskeletal:negative for myalgias Neurological: negative for gait problems and tremors Behavioral/Psych: negative for abusive relationship, depression Endocrine: negative for temperature intolerance     Blood pressure 134/85, pulse 78, temperature 98.1 F (36.7 C), weight 173 lb 12 oz (78.8 kg), last menstrual period 08/19/2016.  Physical Exam Physical Exam General:   alert  Skin:   no rash or abnormalities  Lungs:   clear to auscultation bilaterally  Heart:   regular rate and rhythm, S1, S2 normal, no murmur, click, rub or gallop  Breasts:   normal without suspicious masses, skin or nipple changes or axillary nodes  Abdomen:  normal findings: no organomegaly, soft, non-tender and no hernia  Pelvis:  External genitalia: normal general appearance Urinary system: urethral meatus normal and bladder without fullness, nontender Vaginal: normal without tenderness, induration or masses Cervix: normal appearance Adnexa: normal bimanual exam Uterus: anteverted and non-tender, normal size    50% of 15 min visit spent on counseling and coordination of care.   Data Reviewed Previous medical hx, meds, labs  Assessment     AUB Healthcare non-compliance Dysmenorrhea Lower left pelvic pain    Plan    Orders Placed This Encounter  Procedures  . US Pelvis Complete    Standing Status:   Future    Standing Expiration Date:   10/21/2017    Order Specific Question:   Reason for Exam (SYMPTOM  OR DIAGNOSIS REQUIRED)    Answer:   AUB, lower left pelvic pain, hx of ectopic    Order Specific Question:   Preferred imaging location?    Answer:   [facility]  . US Transvaginal Non-OB    Standing Status:   Future    Standing Expiration Date:   10/21/2017     Order Specific Question:   Reason for Exam (SYMPTOM  OR DIAGNOSIS REQUIRED)    Answer:   AUB, lower left pelvic pain, hx of ectopic    Order Specific Question:   Preferred imaging location?    Answer:   [facility]   Meds ordered this encounter  Medications  . ibuprofen (ADVIL,MOTRIN) 800 MG tablet    Sig: Take 1 tablet (800 mg total) by mouth every 8 (eight) hours as needed.    Dispense:  60 tablet    Refill:  1    Need to obtain previous records Possible management options include: Depo provera injections with compliance to office policies.  Follow up as needed and with annual exam.

## 2016-08-21 NOTE — ED Notes (Signed)
Ambulated to bathroom states she feels better.

## 2016-08-21 NOTE — Addendum Note (Signed)
Addended by: Samantha CrimesENNEY, RACHELLE ANNE on: 08/21/2016 02:27 PM   Modules accepted: Orders

## 2016-08-21 NOTE — ED Notes (Signed)
Patient states she is feeling better however her pain level is still at 8

## 2016-08-21 NOTE — ED Provider Notes (Signed)
[facility]-EMERGENCY DEPT Provider Note   CSN: [id number] Arrival date & time: 08/21/16  7829     History   Chief Complaint Chief Complaint  Patient presents with  . Abdominal Pain  . Hematuria    HPI Leah Barry is a 29 y.o. female  has a past medical history of Anxiety; Apnea, sleep; Bipolar 1 disorder (HCC); Bipolar disorder (HCC); Depression; History of MRSA infection (2010); Irritable bowel syndrome (IBS); Pelvic inflammatory disease; Scalp laceration; and Ulnar fracture (03/09/2014). Who presents with abdominal pain, flank pain, and gross hematuria. The patient states that she has been drinking heavily all week because it's her birthday. She states that she began having abdominal pain Earlier this week, so switched from beer to liquor. She states that her last drink was mentioned, I'm last night. She has been unable to hold down any foods. She has pain in her left upper quadrant of her abdomen and left flank. This morning she had gross hematuria twice. She had one episode of vomiting after drinking moonshine last night. She states that her pain outweighs her nausea. She denies a previous history of abdominal upset. She was diagnosed with PID one week ago. She did not continue taking her doxycycline. Review of culture report shows that her probe was not positive for GC or chlamydia. She denies fever, chills, diarrhea or constipation.  HPI  Past Medical History:  Diagnosis Date  . Anxiety   . Apnea, sleep    no CPAP use, states lost machine during move to [place]  . Bipolar 1 disorder (HCC)   . Bipolar disorder (HCC)    no current med.  . Depression   . History of MRSA infection 2010  . Irritable bowel syndrome (IBS)    no current med.  . Pelvic inflammatory disease   . Scalp laceration    staples to be removed 03/13/2014  . Ulnar fracture 03/09/2014   left    Patient Active Problem List   Diagnosis Date Noted  . Dysmenorrhea 05/28/2014  . GC (gonococcus) 05/28/2014  .  Chlamydia contact 05/28/2014  . Pain 04/14/2014  . UTI symptoms 04/14/2014  . Routine general medical examination at a health care facility 09/26/2013  . Candidiasis of vulva and vagina 09/26/2013  . Unspecified symptom associated with female genital organs 09/10/2013  . Abnormal uterine bleeding (AUB) 09/10/2013  . PID (pelvic inflammatory disease) 07/29/2013  . Dysuria 07/29/2013  . Screening examination for venereal disease 07/29/2013  . Chronic PID (chronic pelvic inflammatory disease) 06/30/2013  . Excessive or frequent menstruation 06/30/2013  . Genital herpes, unspecified 06/30/2013  . Unspecified inflammatory disease of female pelvic organs and tissues 03/12/2013    Past Surgical History:  Procedure Laterality Date  . CYST EXCISION     middle of back  . ORIF ULNAR FRACTURE Left 03/16/2014   Procedure: OPEN REDUCTION INTERNAL FIXATION (ORIF) LEFT ULNAR FRACTURE;  Surgeon: Marlowe Shores, MD;  Location: [facility];  Service: Orthopedics;  Laterality: Left;  left  . OVARIAN CYST REMOVAL    . THYROID CYST EXCISION     nodule exc.  . TUBAL LIGATION      OB History    Gravida Para Term Preterm AB Living   5 4 4     4    SAB TAB Ectopic Multiple Live Births           4       Home Medications    Prior to Admission medications   Medication Sig  Start Date End Date Taking? Authorizing Provider  dicyclomine (BENTYL) 20 MG tablet Take 1 tablet (20 mg total) by mouth 2 (two) times daily. Patient not taking: Reported on 08/21/2016 08/21/16   Arthor Captain, PA-C  doxycycline (VIBRAMYCIN) 100 MG capsule Take 1 capsule (100 mg total) by mouth 2 (two) times daily. Patient not taking: Reported on 08/21/2016 08/09/16   Glynn Octave, MD  esomeprazole (NEXIUM) 40 MG capsule Take 1 capsule (40 mg total) by mouth daily. Patient not taking: Reported on 08/21/2016 08/21/16   Arthor Captain, PA-C  ibuprofen (ADVIL,MOTRIN) 800 MG tablet Take 1 tablet (800 mg total) by mouth  every 8 (eight) hours as needed. 08/21/16   Rachelle A Denney, CNM  ondansetron (ZOFRAN) 4 MG tablet Take 1 tablet (4 mg total) by mouth every 8 (eight) hours as needed for nausea or vomiting. Patient not taking: Reported on 08/21/2016 08/21/16   Arthor Captain, PA-C  sucralfate (CARAFATE) 1 g tablet Take 1 tablet (1 g total) by mouth 4 (four) times daily -  with meals and at bedtime. Patient not taking: Reported on 08/21/2016 08/21/16   Arthor Captain, PA-C    Family History Family History  Problem Relation Age of Onset  . Heart disease Mother   . Cancer Maternal Grandmother     breast  . Cancer Paternal Grandmother     breast    Social History Social History  Substance Use Topics  . Smoking status: Former Smoker    Years: 3.00    Types: Cigarettes  . Smokeless tobacco: Never Used     Comment: 1-2 cig./day  . Alcohol use 0.0 oz/week     Allergies   Contrast media [iodinated diagnostic agents]; Sulfa antibiotics; Toradol [ketorolac tromethamine]; Bee venom; Flagyl [metronidazole]; Naproxen; and Bactrim [sulfamethoxazole-trimethoprim]   Review of Systems Review of Systems  Ten systems reviewed and are negative for acute change, except as noted in the HPI.   Physical Exam Updated Vital Signs BP 136/86   Pulse 71   Temp 98.2 F (36.8 C) (Oral)   Resp 18   Ht 5\' 7"  (1.702 m)   Wt 75.8 kg   LMP 08/19/2016   SpO2 99%   Breastfeeding? No   BMI 26.16 kg/m   Physical Exam  Constitutional: She is oriented to person, place, and time. She appears well-developed and well-nourished. No distress.  HENT:  Head: Normocephalic and atraumatic.  Eyes: Conjunctivae are normal. No scleral icterus.  Neck: Normal range of motion.  Cardiovascular: Normal rate, regular rhythm and normal heart sounds.  Exam reveals no gallop and no friction rub.   No murmur heard. Pulmonary/Chest: Effort normal and breath sounds normal. No respiratory distress.  Abdominal: Soft. Bowel sounds are normal.  She exhibits no distension and no mass. There is tenderness in the left upper quadrant. There is guarding and CVA tenderness (left side).    Neurological: She is alert and oriented to person, place, and time.  Skin: Skin is warm and dry. She is not diaphoretic.  Nursing note and vitals reviewed.    ED Treatments / Results  Labs (all labs ordered are listed, but only abnormal results are displayed) Labs Reviewed  COMPREHENSIVE METABOLIC PANEL - Abnormal; Notable for the following:       Result Value   Glucose, Bld 100 (*)    Calcium 8.4 (*)    Total Protein 5.8 (*)    Albumin 3.4 (*)    AST 14 (*)    ALT 13 (*)  All other components within normal limits  CBC - Abnormal; Notable for the following:    MCV 77.8 (*)    All other components within normal limits  URINALYSIS, ROUTINE W REFLEX MICROSCOPIC (NOT AT [facility]) - Abnormal; Notable for the following:    APPearance HAZY (*)    Specific Gravity, Urine >1.030 (*)    Hgb urine dipstick LARGE (*)    Leukocytes, UA TRACE (*)    All other components within normal limits  URINE MICROSCOPIC-ADD ON - Abnormal; Notable for the following:    Squamous Epithelial / LPF 0-5 (*)    Bacteria, UA FEW (*)    Crystals URIC ACID CRYSTALS (*)    All other components within normal limits  URINE CULTURE  LIPASE, BLOOD  I-STAT BETA HCG BLOOD, ED ([facility], [facility], [facility] ONLY)    EKG  EKG Interpretation None       Radiology Ct Renal Stone Study  Result Date: 08/21/2016 CLINICAL DATA:  Left flank pain for 2 days EXAM: CT ABDOMEN AND PELVIS WITHOUT CONTRAST TECHNIQUE: Multidetector CT imaging of the abdomen and pelvis was performed following the standard protocol without IV contrast. COMPARISON:  05/02/2015 FINDINGS: Lower chest: Lung bases are unremarkable. Hepatobiliary: Unenhanced liver shows no biliary ductal dilatation. No calcified gallstones are noted within gallbladder. Pancreas: Unenhanced pancreas is unremarkable. Spleen: Unenhanced spleen is  unremarkable. Adrenals/Urinary Tract: No adrenal gland mass. Unenhanced kidneys shows no nephrolithiasis. No hydronephrosis or hydroureter. No calcified ureteral calculi are noted. No calcified calculi are noted within under distended urinary bladder. Stomach/Bowel: No small bowel obstruction. No gastric outlet obstruction. Moderate stool noted in right colon and transverse colon. Some colonic stool noted descending colon and rectosigmoid colon. Moderate gas noted within rectum. No distal colonic obstruction. No colitis or diverticulitis. No pericecal inflammation. Normal appendix. Vascular/Lymphatic: No aortic aneurysm. No retroperitoneal or mesenteric adenopathy. Reproductive: Unenhanced uterus is unremarkable. No adnexal masses noted. Other: No ascites or free abdominal air.  No inguinal adenopathy. Musculoskeletal: No destructive bony lesions are noted. Sagittal images of the spine are unremarkable. Mild degenerative changes lower thoracic spine. IMPRESSION: 1. No nephrolithiasis.  No hydronephrosis or hydroureter. 2. No calcified ureteral calculi. 3. No calcified calculi are noted within under distended urinary bladder. 4. Normal appendix.  No pericecal inflammation. 5. No small bowel obstruction. Electronically Signed   By: Natasha Mead M.D.   On: 08/21/2016 09:57    Procedures Procedures (including critical care time)  Medications Ordered in ED Medications  fentaNYL (SUBLIMAZE) injection 50 mcg (50 mcg Intravenous Given 08/21/16 0800)  ondansetron (ZOFRAN) injection 4 mg (4 mg Intravenous Given 08/21/16 0800)  sodium chloride 0.9 % bolus 1,000 mL (0 mLs Intravenous Stopped 08/21/16 0846)  HYDROmorphone (DILAUDID) injection 0.5 mg (0.5 mg Intravenous Given 08/21/16 0956)     Initial Impression / Assessment and Plan / ED Course  I have reviewed the triage vital signs and the nursing notes.  Pertinent labs & imaging results that were available during my care of the patient were reviewed by me and  considered in my medical decision making (see chart for details).  Clinical Course  Value Comment By Time  Hgb urine dipstick: (!) LARGE (Reviewed) Arthor Captain, PA-C 10/09 0840  Crystals: (!) URIC ACID CRYSTALS (Reviewed) Arthor Captain, PA-C 10/09 0840  RBC / HPF: 6-30 Patient with white cells, few bacteria, large blood and crystals on UA diff. Will obtain CT renal stone study ./ Arthor Captain, PA-C 10/09 0840   Patient CT negative for acute  abnormality. She has gross hematuria , but UA does not appear infected. I have discussed the case with Dr. Fredderick PhenixBelfi. We will treat for gastritis. She is to follow up and have her urine checked to make sure that UA is negative for blood in 1 week, otherwise patient may need a hematuria work up . I have discussed this with the patient. Whe expresses understanding and agrees with poc. Arthor Captainbigail Yechezkel Fertig, PA-C 10/09 1030      Final Clinical Impressions(s) / ED Diagnoses   Final diagnoses:  Acute alcoholic gastritis without hemorrhage  Gross hematuria    New Prescriptions Discharge Medication List as of 08/21/2016 10:44 AM    START taking these medications   Details  dicyclomine (BENTYL) 20 MG tablet Take 1 tablet (20 mg total) by mouth 2 (two) times daily., Starting Mon 08/21/2016, Print    esomeprazole (NEXIUM) 40 MG capsule Take 1 capsule (40 mg total) by mouth daily., Starting Mon 08/21/2016, Print    ondansetron (ZOFRAN) 4 MG tablet Take 1 tablet (4 mg total) by mouth every 8 (eight) hours as needed for nausea or vomiting., Starting Mon 08/21/2016, Print    sucralfate (CARAFATE) 1 g tablet Take 1 tablet (1 g total) by mouth 4 (four) times daily -  with meals and at bedtime., Starting Mon 08/21/2016, Print         HintonAbigail Frimet Durfee, PA-C 08/21/16 1626    Rolan BuccoMelanie Belfi, MD 08/22/16 [phone]

## 2016-08-21 NOTE — ED Notes (Signed)
C/o abd. Pain onset 1 week ago , states she had a lot of alcohol intake this weekend was drinking moonshine, c/o left upper quad pain .  C/o urinating blood.

## 2016-08-21 NOTE — ED Triage Notes (Signed)
Pt c/o abdominal pain and blood in urine onset last night. Pt reports that she drank what she thought was moonshine. Pt took tylenol for pain without relief. Pt unable to tolerate food and feels dehydrated.

## 2016-08-21 NOTE — Discharge Instructions (Signed)
Your work up showed no emergency abnormalities. There is blood in your urine, but at this time there is no other sign of infection or kidney stone. You will need to see your primary care doctor in 1 week to have a repeat urinalysis and make sure that all the blood in your urine is gone, otherwise you may need to have a specialized work up with a urologist.  I believe your pain is due to the alcohol you have been drinking. It can severely irritate the lining of your stomach and small intestine and cause intense pain and nausea. Please do not drink alcohol for the next several weeks. Avoid caffeine, soda, coffee, hot sauces, spicy foods and other irritating foods. 2. But the lining of your stomach heal. Do not take Motrin, ibuprofen, Aleve, Advil, Goody's powders BCs powders during the time they you have gastritis. Because this can also irritate the lining of her stomach. Please take Tylenol for pain, but do not combine this with alcohol at all.   Abdominal (belly) pain can be caused by many things. Your caregiver performed an examination and possibly ordered blood/urine tests and imaging (CT scan, x-rays, ultrasound). Many cases can be observed and treated at home after initial evaluation in the emergency department. Even though you are being discharged home, abdominal pain can be unpredictable. Therefore, you need a repeated exam if your pain does not resolve, returns, or worsens. Most patients with abdominal pain don't have to be admitted to the hospital or have surgery, but serious problems like appendicitis and gallbladder attacks can start out as nonspecific pain. Many abdominal conditions cannot be diagnosed in one visit, so follow-up evaluations are very important. SEEK IMMEDIATE MEDICAL ATTENTION IF: The pain does not go away or becomes severe.  A temperature above 101 develops.  Repeated vomiting occurs (multiple episodes).  The pain becomes localized to portions of the abdomen. The right side  could possibly be appendicitis. In an adult, the left lower portion of the abdomen could be colitis or diverticulitis.  Blood is being passed in stools or vomit (bright red or black tarry stools).  Return also if you develop chest pain, difficulty breathing, dizziness or fainting, or become confused, poorly responsive, or inconsolable (young children).

## 2016-08-22 ENCOUNTER — Ambulatory Visit: Payer: Self-pay | Admitting: Obstetrics & Gynecology

## 2016-08-22 LAB — CERVICOVAGINAL ANCILLARY ONLY
Chlamydia: NEGATIVE
Neisseria Gonorrhea: NEGATIVE
Trichomonas: NEGATIVE

## 2016-08-22 LAB — URINE CULTURE

## 2016-08-25 LAB — CERVICOVAGINAL ANCILLARY ONLY
Bacterial vaginitis: POSITIVE — AB
Candida vaginitis: NEGATIVE

## 2016-08-28 ENCOUNTER — Other Ambulatory Visit: Payer: Self-pay | Admitting: Certified Nurse Midwife

## 2016-08-28 DIAGNOSIS — B9689 Other specified bacterial agents as the cause of diseases classified elsewhere: Secondary | ICD-10-CM

## 2016-08-28 DIAGNOSIS — N760 Acute vaginitis: Principal | ICD-10-CM

## 2016-08-28 MED ORDER — CLINDAMYCIN HCL 300 MG PO CAPS: 300.0000 mg | ORAL_CAPSULE | Freq: Two times a day (BID) | ORAL | 0 refills | Status: DC

## 2016-09-06 ENCOUNTER — Ambulatory Visit: Payer: Medicaid Other | Admitting: Obstetrics and Gynecology

## 2016-09-11 ENCOUNTER — Encounter (HOSPITAL_COMMUNITY): Payer: Self-pay | Admitting: *Deleted

## 2016-09-11 ENCOUNTER — Inpatient Hospital Stay (HOSPITAL_COMMUNITY)
Admission: AD | Admit: 2016-09-11 | Discharge: 2016-09-11 | Disposition: A | Discharge status: Home / Self Care | Payer: Medicaid Other | Source: Ambulatory Visit | Attending: Family Medicine | Admitting: Family Medicine

## 2016-09-11 DIAGNOSIS — Z87891 Personal history of nicotine dependence: Secondary | ICD-10-CM | POA: Diagnosis not present

## 2016-09-11 DIAGNOSIS — R109 Unspecified abdominal pain: Secondary | ICD-10-CM | POA: Insufficient documentation

## 2016-09-11 DIAGNOSIS — N898 Other specified noninflammatory disorders of vagina: Secondary | ICD-10-CM | POA: Diagnosis not present

## 2016-09-11 DIAGNOSIS — R300 Dysuria: Secondary | ICD-10-CM

## 2016-09-11 LAB — URINALYSIS, ROUTINE W REFLEX MICROSCOPIC
Bilirubin Urine: NEGATIVE
Glucose, UA: NEGATIVE mg/dL
Hgb urine dipstick: NEGATIVE
Ketones, ur: NEGATIVE mg/dL
Leukocytes, UA: NEGATIVE
Nitrite: NEGATIVE
Protein, ur: NEGATIVE mg/dL
Specific Gravity, Urine: >1.03 — ABNORMAL HIGH (ref 1.005–1.030)
pH: 5.5 (ref 5.0–8.0)

## 2016-09-11 LAB — WET PREP, GENITAL
Sperm: NONE SEEN
Trich, Wet Prep: NONE SEEN
Yeast Wet Prep HPF POC: NONE SEEN

## 2016-09-11 LAB — POCT PREGNANCY, URINE: Preg Test, Ur: NEGATIVE

## 2016-09-11 MED ORDER — PHENAZOPYRIDINE HCL 100 MG PO TABS: 100.0000 mg | ORAL_TABLET | Freq: Three times a day (TID) | ORAL | 0 refills | Status: DC | PRN

## 2016-09-11 MED ORDER — METRONIDAZOLE 0.75 % VA GEL: 1.0000 | Freq: Every day | VAGINAL | 0 refills | Status: AC

## 2016-09-11 MED ORDER — METRONIDAZOLE 1 % EX GEL: Freq: Every day | CUTANEOUS | 0 refills | Status: AC

## 2016-09-11 MED ORDER — IBUPROFEN 600 MG PO TABS
600.0000 mg | ORAL_TABLET | Freq: Once | ORAL | Status: AC
  Administered 2016-09-11: 600 mg via ORAL
  Filled 2016-09-11: qty 1

## 2016-09-11 NOTE — MAU Provider Note (Signed)
History   Leah Barry is a 30 year female here with complaints of burning on urination, vaginal discharge and suprapubic pain.  She was treated 7 days ago for bacterial vaginosis at [facility] with clindamycin but stopped taking the medication once she developed diarrhea. She has a history of GC, PID and BV.  She states that she has an allergy to toradol but the reaction she had was burning upon injection.  CSN: [id number]  Arrival date and time: 09/11/16 1547   None     Chief Complaint  Patient presents with  . Abdominal Pain  . Nausea  . Vaginal Discharge   Vaginal Discharge  The patient's primary symptoms include vaginal discharge. The current episode started in the past 7 days. The problem occurs 2 to 4 times per day. The problem has been unchanged. She is not pregnant. Associated symptoms include abdominal pain, anorexia, diarrhea, dysuria, frequency, nausea, painful intercourse and urgency. Pertinent negatives include no constipation, discolored urine or hematuria. The vaginal discharge was clear and watery. There has been no bleeding. She has not been passing clots. She has not been passing tissue. The symptoms are aggravated by urinating. The treatment provided no relief. She is sexually active. It is possible that her partner has an STD.  Dysuria   This is a new problem. The current episode started in the past 7 days. The problem occurs every urination. The quality of the pain is described as burning. The pain is moderate. There has been no fever. She is sexually active. Associated symptoms include frequency, nausea and urgency. Pertinent negatives include no hematuria.      Past Medical History:  Diagnosis Date  . Anxiety   . Apnea, sleep    no CPAP use, states lost machine during move to [place]  . Bipolar 1 disorder (HCC)   . Bipolar disorder (HCC)    no current med.  . Depression   . History of MRSA infection 2010  . Irritable bowel syndrome (IBS)    no current med.  .  Pelvic inflammatory disease   . Scalp laceration    staples to be removed 03/13/2014  . Ulnar fracture 03/09/2014   left    Past Surgical History:  Procedure Laterality Date  . CYST EXCISION     middle of back  . ORIF ULNAR FRACTURE Left 03/16/2014   Procedure: OPEN REDUCTION INTERNAL FIXATION (ORIF) LEFT ULNAR FRACTURE;  Surgeon: Marlowe Shores, MD;  Location: [facility];  Service: Orthopedics;  Laterality: Left;  left  . OVARIAN CYST REMOVAL    . THYROID CYST EXCISION     nodule exc.  . TUBAL LIGATION      Family History  Problem Relation Age of Onset  . Heart disease Mother   . Cancer Maternal Grandmother     breast  . Cancer Paternal Grandmother     breast    Social History  Substance Use Topics  . Smoking status: Former Smoker    Years: 3.00    Types: Cigarettes  . Smokeless tobacco: Never Used     Comment: 1-2 cig./day  . Alcohol use 0.0 oz/week    Allergies:  Allergies  Allergen Reactions  . Contrast Media [Iodinated Diagnostic Agents] Itching    Pt developed itching after contrast administration; patient had also received Fentanyl immediately prior to transport to [place]. The itching was without any rash, it was concentrated in the face/nose, and relieved with Benadryl. Possible that itching a side effect of pain medication  rather than contrast.  . Sulfa Antibiotics Swelling    SWELLING OF EYES  . Toradol [Ketorolac Tromethamine] Itching  . Bee Venom Swelling    "Bees make my eyes swell."  . Flagyl [Metronidazole] Nausea And Vomiting  . Naproxen Other (See Comments)    NOSE BLEED  . Bactrim [Sulfamethoxazole-Trimethoprim]     Eyes swell    Prescriptions Prior to Admission  Medication Sig Dispense Refill Last Dose  . clindamycin (CLEOCIN) 300 MG capsule Take 1 capsule (300 mg total) by mouth 2 (two) times daily. 14 capsule 0   . dicyclomine (BENTYL) 20 MG tablet Take 1 tablet (20 mg total) by mouth 2 (two) times daily. (Patient not taking:  Reported on 08/21/2016) 20 tablet 0 Not Taking  . doxycycline (VIBRAMYCIN) 100 MG capsule Take 1 capsule (100 mg total) by mouth 2 (two) times daily. (Patient not taking: Reported on 08/21/2016) 20 capsule 0 Not Taking  . esomeprazole (NEXIUM) 40 MG capsule Take 1 capsule (40 mg total) by mouth daily. (Patient not taking: Reported on 08/21/2016) 30 capsule 0 Not Taking  . ibuprofen (ADVIL,MOTRIN) 800 MG tablet Take 1 tablet (800 mg total) by mouth every 8 (eight) hours as needed. 60 tablet 1   . ondansetron (ZOFRAN) 4 MG tablet Take 1 tablet (4 mg total) by mouth every 8 (eight) hours as needed for nausea or vomiting. (Patient not taking: Reported on 08/21/2016) 10 tablet 0 Not Taking  . sucralfate (CARAFATE) 1 g tablet Take 1 tablet (1 g total) by mouth 4 (four) times daily -  with meals and at bedtime. (Patient not taking: Reported on 08/21/2016) 30 tablet 0 Not Taking    Review of Systems  Constitutional: Negative.   HENT: Negative.   Eyes: Negative.   Respiratory: Negative.   Cardiovascular: Negative.   Gastrointestinal: Positive for abdominal pain, anorexia, diarrhea and nausea. Negative for constipation.  Genitourinary: Positive for dysuria, frequency, urgency and vaginal discharge. Negative for hematuria.  Skin: Negative.   Neurological: Negative.   Endo/Heme/Allergies: Negative.   Psychiatric/Behavioral: Negative.    Physical Exam   Blood pressure 117/73, pulse 70, temperature 98.1 F (36.7 C), temperature source Oral, resp. rate 18, height 5\' 6"  (1.676 m), weight 77.9 kg (171 lb 12.8 oz), last menstrual period 08/19/2016.  Physical Exam  Constitutional: She is oriented to person, place, and time. She appears well-developed and well-nourished.  HENT:  Head: Normocephalic.  Neck: Normal range of motion.  Cardiovascular: Normal rate and regular rhythm.   Respiratory: Effort normal and breath sounds normal.  GI: Soft. Bowel sounds are normal. There is tenderness.  Patient has pain  with palpation over the suprapubic region.   Genitourinary: Vagina normal and uterus normal.  Genitourinary Comments: Patient very tender with speculum insertion and digital exam but no CMT. Cervix was pink with no erythema; no lesions or masses noted on labia, vaginal walls or cervix. Small amount of white milky discharge pooling in the vagina but no odor.  Musculoskeletal: Normal range of motion.  Neurological: She is alert and oriented to person, place, and time.  Skin: Skin is warm and dry.  Psychiatric: She has a normal mood and affect.    MAU Course  Procedures  MDM -Pelvic exam and speculum exam (see note above) -GC cultures-pending -Wet prep-neagtive for and only WBC are moderate -UA-negative for nitrites but specific gravity is slightly elevated.  -600 mg of ibuprofen for pain  Assessment and Plan  Patient Leah Barry is a 30 year old patient  here for vaginal discharge, pain with urination and abdominal pain.  Her pelvic exam and speculum exam do not support a diagnosis of PID at this time; GC cultures are pending. Patient is requesting metrogel for her BV; will send her home with a prescription for metrogel and pyridium for possible cystitis. Encouraged hydration and follow up with [facility].   Charlesetta GaribaldiKathryn Lorraine Kailin Principato CNM 09/11/2016, 7:11 PM

## 2016-09-11 NOTE — Discharge Instructions (Signed)
Dysuria °Dysuria is pain or discomfort while urinating. The pain or discomfort may be felt in the tube that carries urine out of the bladder (urethra) or in the surrounding tissue of the genitals. The pain may also be felt in the groin area, lower abdomen, and lower back. You may have to urinate frequently or have the sudden feeling that you have to urinate (urgency). Dysuria can affect both men and women, but is more common in women. °Dysuria can be caused by many different things, including: °· Urinary tract infection in women. °· Infection of the kidney or bladder. °· Kidney stones or bladder stones. °· Certain sexually transmitted infections (STIs), such as chlamydia. °· Dehydration. °· Inflammation of the vagina. °· Use of certain medicines. °· Use of certain soaps or scented products that cause irritation. °HOME CARE INSTRUCTIONS °Watch your dysuria for any changes. The following actions may help to reduce any discomfort you are feeling: °· Drink enough fluid to keep your urine clear or pale yellow. °· Empty your bladder often. Avoid holding urine for long periods of time. °· After a bowel movement or urination, women should cleanse from front to back, using each tissue only once. °· Empty your bladder after sexual intercourse. °· Take medicines only as directed by your health care provider. °· If you were prescribed an antibiotic medicine, finish it all even if you start to feel better. °· Avoid caffeine, tea, and alcohol. They can irritate the bladder and make dysuria worse. In men, alcohol may irritate the prostate. °· Keep all follow-up visits as directed by your health care provider. This is important. °· If you had any tests done to find the cause of dysuria, it is your responsibility to obtain your test results. Ask the lab or department performing the test when and how you will get your results. Talk with your health care provider if you have any questions about your results. °SEEK MEDICAL CARE  IF: °· You develop pain in your back or sides. °· You have a fever. °· You have nausea or vomiting. °· You have blood in your urine. °· You are not urinating as often as you usually do. °SEEK IMMEDIATE MEDICAL CARE IF: °· You pain is severe and not relieved with medicines. °· You are unable to hold down any fluids. °· You or someone else notices a change in your mental function. °· You have a rapid heartbeat at rest. °· You have shaking or chills. °· You feel extremely weak. °  °This information is not intended to replace advice given to you by your health care provider. Make sure you discuss any questions you have with your health care provider. °  °Document Released: 07/28/2004 Document Revised: 11/20/2014 Document Reviewed: 06/25/2014 °Elsevier Interactive Patient Education ©2016 Elsevier Inc. ° °Bacterial Vaginosis °Bacterial vaginosis is a vaginal infection that occurs when the normal balance of bacteria in the vagina is disrupted. It results from an overgrowth of certain bacteria. This is the most common vaginal infection in women of childbearing age. Treatment is important to prevent complications, especially in pregnant women, as it can cause a premature delivery. °CAUSES  °Bacterial vaginosis is caused by an increase in harmful bacteria that are normally present in smaller amounts in the vagina. Several different kinds of bacteria can cause bacterial vaginosis. However, the reason that the condition develops is not fully understood. °RISK FACTORS °Certain activities or behaviors can put you at an increased risk of developing bacterial vaginosis, including: °· Having a new sex   partner or multiple sex partners. °· Douching. °· Using an intrauterine device (IUD) for contraception. °Women do not get bacterial vaginosis from toilet seats, bedding, swimming pools, or contact with objects around them. °SIGNS AND SYMPTOMS  °Some women with bacterial vaginosis have no signs or symptoms. Common symptoms  include: °· Grey vaginal discharge. °· A fishlike odor with discharge, especially after sexual intercourse. °· Itching or burning of the vagina and vulva. °· Burning or pain with urination. °DIAGNOSIS  °Your health care provider will take a medical history and examine the vagina for signs of bacterial vaginosis. A sample of vaginal fluid may be taken. Your health care provider will look at this sample under a microscope to check for bacteria and abnormal cells. A vaginal pH test may also be done.  °TREATMENT  °Bacterial vaginosis may be treated with antibiotic medicines. These may be given in the form of a pill or a vaginal cream. A second round of antibiotics may be prescribed if the condition comes back after treatment. Because bacterial vaginosis increases your risk for sexually transmitted diseases, getting treated can help reduce your risk for chlamydia, gonorrhea, HIV, and herpes. °HOME CARE INSTRUCTIONS  °· Only take over-the-counter or prescription medicines as directed by your health care provider. °· If antibiotic medicine was prescribed, take it as directed. Make sure you finish it even if you start to feel better. °· Tell all sexual partners that you have a vaginal infection. They should see their health care provider and be treated if they have problems, such as a mild rash or itching. °· During treatment, it is important that you follow these instructions: °¨ Avoid sexual activity or use condoms correctly. °¨ Do not douche. °¨ Avoid alcohol as directed by your health care provider. °¨ Avoid breastfeeding as directed by your health care provider. °SEEK MEDICAL CARE IF:  °· Your symptoms are not improving after 3 days of treatment. °· You have increased discharge or pain. °· You have a fever. °MAKE SURE YOU:  °· Understand these instructions. °· Will watch your condition. °· Will get help right away if you are not doing well or get worse. °FOR MORE INFORMATION  °Centers for Disease Control and  Prevention, Division of STD Prevention: www.cdc.gov/std °American Sexual Health Association (ASHA): www.ashastd.org  °  °This information is not intended to replace advice given to you by your health care provider. Make sure you discuss any questions you have with your health care provider. °  °Document Released: 10/30/2005 Document Revised: 11/20/2014 Document Reviewed: 06/11/2013 °Elsevier Interactive Patient Education ©2016 Elsevier Inc. ° °

## 2016-09-11 NOTE — MAU Note (Signed)
Was treated for BP, symptoms got worse.  Had white clogs and stuff coming out, diarrhea, under stomach is hurting.  Doesn't know if she has an STD- had sex after. Is having burning now.  Has been nauseated. Doesn't know if she is preg in her tubes again or what..[name]

## 2016-09-12 LAB — GC/CHLAMYDIA PROBE AMP (~~LOC~~) NOT AT ARMC
Chlamydia: NEGATIVE
Neisseria Gonorrhea: NEGATIVE

## 2016-09-20 ENCOUNTER — Ambulatory Visit: Payer: Medicaid Other | Admitting: Obstetrics & Gynecology

## 2016-10-02 ENCOUNTER — Emergency Department (HOSPITAL_COMMUNITY)
Admission: EM | Admit: 2016-10-02 | Discharge: 2016-10-02 | Disposition: A | Discharge status: Home / Self Care | Payer: Medicaid Other | Attending: Emergency Medicine | Admitting: Emergency Medicine

## 2016-10-02 ENCOUNTER — Encounter (HOSPITAL_COMMUNITY): Payer: Self-pay | Admitting: Nurse Practitioner

## 2016-10-02 DIAGNOSIS — R69 Illness, unspecified: Secondary | ICD-10-CM

## 2016-10-02 DIAGNOSIS — J029 Acute pharyngitis, unspecified: Secondary | ICD-10-CM | POA: Diagnosis present

## 2016-10-02 DIAGNOSIS — Z87891 Personal history of nicotine dependence: Secondary | ICD-10-CM | POA: Diagnosis not present

## 2016-10-02 DIAGNOSIS — J111 Influenza due to unidentified influenza virus with other respiratory manifestations: Secondary | ICD-10-CM

## 2016-10-02 LAB — URINALYSIS, ROUTINE W REFLEX MICROSCOPIC
Bilirubin Urine: NEGATIVE
Glucose, UA: NEGATIVE mg/dL
Hgb urine dipstick: NEGATIVE
Ketones, ur: NEGATIVE mg/dL
Leukocytes, UA: NEGATIVE
Nitrite: NEGATIVE
Protein, ur: NEGATIVE mg/dL
Specific Gravity, Urine: 1.023 (ref 1.005–1.030)
pH: 5.5 (ref 5.0–8.0)

## 2016-10-02 LAB — POC URINE PREG, ED: Preg Test, Ur: NEGATIVE

## 2016-10-02 LAB — RAPID STREP SCREEN (MED CTR MEBANE ONLY): Streptococcus, Group A Screen (Direct): NEGATIVE

## 2016-10-02 MED ORDER — IBUPROFEN 600 MG PO TABS: 600.0000 mg | ORAL_TABLET | Freq: Four times a day (QID) | ORAL | 0 refills | Status: DC | PRN

## 2016-10-02 MED ORDER — ONDANSETRON 4 MG PO TBDP: 4.0000 mg | ORAL_TABLET | Freq: Three times a day (TID) | ORAL | 0 refills | Status: DC | PRN

## 2016-10-02 MED ORDER — ONDANSETRON 4 MG PO TBDP
4.0000 mg | ORAL_TABLET | Freq: Once | ORAL | Status: AC
  Administered 2016-10-02: 4 mg via ORAL
  Filled 2016-10-02: qty 1

## 2016-10-02 NOTE — ED Provider Notes (Signed)
[facility]-EMERGENCY DEPT Provider Note   CSN: [id number] Arrival date & time: 10/02/16  1310     History   Chief Complaint Chief Complaint  Patient presents with  . Generalized Body Aches    HPI Leah Barry is a 30 y.o. female.  HPI   The patient is a 30 year old female, she has a known history of having bipolar disorder as well as anxiety and sleep apnea. She also has a history of a bilateral tubal ligation yet was able to get pregnant last year. She reports that for the last 3 days she has had intermittent waxing and waning sore throat, body aches, headache, feels as though she is nauseated and does not want to drink. She denies any abdominal pain, has had no coughing, reports that she had subjective fevers. She works in [employer], she has not had any known sick exposures but is around lots of people. There is no rashes, no swelling, no diarrhea. She does report dysuria.  Past Medical History:  Diagnosis Date  . Anxiety   . Apnea, sleep    no CPAP use, states lost machine during move to [address]  . Bipolar 1 disorder (HCC)   . Bipolar disorder (HCC)    no current med.  . Depression   . History of MRSA infection 2010  . Irritable bowel syndrome (IBS)    no current med.  . Pelvic inflammatory disease   . Scalp laceration    staples to be removed 03/13/2014  . Ulnar fracture 03/09/2014   left    Patient Active Problem List   Diagnosis Date Noted  . Dysmenorrhea 05/28/2014  . GC (gonococcus) 05/28/2014  . Chlamydia contact 05/28/2014  . Pain 04/14/2014  . UTI symptoms 04/14/2014  . Routine general medical examination at a health care facility 09/26/2013  . Candidiasis of vulva and vagina 09/26/2013  . Unspecified symptom associated with female genital organs 09/10/2013  . Abnormal uterine bleeding (AUB) 09/10/2013  . PID (pelvic inflammatory disease) 07/29/2013  . Dysuria 07/29/2013  . Screening examination for venereal disease 07/29/2013  . Chronic PID (chronic pelvic  inflammatory disease) 06/30/2013  . Excessive or frequent menstruation 06/30/2013  . Genital herpes, unspecified 06/30/2013  . Unspecified inflammatory disease of female pelvic organs and tissues 03/12/2013    Past Surgical History:  Procedure Laterality Date  . CYST EXCISION     middle of back  . ORIF ULNAR FRACTURE Left 03/16/2014   Procedure: OPEN REDUCTION INTERNAL FIXATION (ORIF) LEFT ULNAR FRACTURE;  Surgeon: Marlowe ShoresMatthew A Weingold, MD;  Location: [facility];  Service: Orthopedics;  Laterality: Left;  left  . OVARIAN CYST REMOVAL    . THYROID CYST EXCISION     nodule exc.  . TUBAL LIGATION      OB History    Gravida Para Term Preterm AB Living   5 4 4     4    SAB TAB Ectopic Multiple Live Births           4       Home Medications    Prior to Admission medications   Medication Sig Start Date End Date Taking? Authorizing Provider  esomeprazole (NEXIUM) 40 MG capsule Take 1 capsule (40 mg total) by mouth daily. Patient not taking: Reported on 08/21/2016 08/21/16   Arthor CaptainAbigail Harris, PA-C  ibuprofen (ADVIL,MOTRIN) 600 MG tablet Take 1 tablet (600 mg total) by mouth every 6 (six) hours as needed. 10/02/16   Eber HongBrian Talley Kreiser, MD  ondansetron (ZOFRAN ODT) 4 MG  disintegrating tablet Take 1 tablet (4 mg total) by mouth every 8 (eight) hours as needed for nausea. 10/02/16   Eber Hong, MD  phenazopyridine (PYRIDIUM) 100 MG tablet Take 1 tablet (100 mg total) by mouth 3 (three) times daily as needed for pain. 09/11/16   Armando Reichert, CNM    Family History Family History  Problem Relation Age of Onset  . Heart disease Mother   . Cancer Maternal Grandmother     breast  . Cancer Paternal Grandmother     breast    Social History Social History  Substance Use Topics  . Smoking status: Former Smoker    Years: 3.00    Types: Cigarettes  . Smokeless tobacco: Never Used     Comment: 1-2 cig./day  . Alcohol use 0.0 oz/week     Allergies   Contrast media [iodinated  diagnostic agents]; Sulfa antibiotics; Toradol [ketorolac tromethamine]; Bee venom; Flagyl [metronidazole]; Naproxen; and Bactrim [sulfamethoxazole-trimethoprim]   Review of Systems Review of Systems  All other systems reviewed and are negative.    Physical Exam Updated Vital Signs BP 129/77 (BP Location: Right Arm)   Pulse 75   Temp 98.6 F (37 C) (Oral)   Resp 22   SpO2 100%   Physical Exam  Constitutional: She appears well-developed and well-nourished. No distress.  HENT:  Head: Normocephalic and atraumatic.  Mouth/Throat: Oropharynx is clear and moist. No oropharyngeal exudate.  Oropharynx is clear and moist, there is no erythema exudate asymmetry or hypertrophy, the uvula is midline, phonation is normal. Moist mucous membranes, normal dentition, no trismus or torticollis  Eyes: Conjunctivae and EOM are normal. Pupils are equal, round, and reactive to light. Right eye exhibits no discharge. Left eye exhibits no discharge. No scleral icterus.  Neck: Normal range of motion. Neck supple. No JVD present. No thyromegaly present.  Supple neck with no lymphadenopathy  Cardiovascular: Normal rate, regular rhythm, normal heart sounds and intact distal pulses.  Exam reveals no gallop and no friction rub.   No murmur heard. Pulmonary/Chest: Effort normal and breath sounds normal. No respiratory distress. She has no wheezes. She has no rales.  Lungs are clear, no rales wheezing or rhonchi, speaks in full sentences without any distress or accessory muscle use  Abdominal: Soft. Bowel sounds are normal. She exhibits no distension and no mass. There is no tenderness.  Musculoskeletal: Normal range of motion. She exhibits no edema or tenderness.  Lymphadenopathy:    She has no cervical adenopathy.  Neurological: She is alert. Coordination normal.  Gait is normal, speech is normal, strength is normal in all 4 extremities  Skin: Skin is warm and dry. No rash noted. No erythema.  No rash    Psychiatric: She has a normal mood and affect. Her behavior is normal.  Normal mood, cheery disposition  Nursing note and vitals reviewed.    ED Treatments / Results  Labs (all labs ordered are listed, but only abnormal results are displayed) Labs Reviewed  URINALYSIS, ROUTINE W REFLEX MICROSCOPIC (NOT AT [facility]) - Abnormal; Notable for the following:       Result Value   APPearance HAZY (*)    All other components within normal limits  RAPID STREP SCREEN (NOT AT [facility])  CULTURE, GROUP A STREP ([facility])  POC URINE PREG, ED    Radiology No results found.  Procedures Procedures (including critical care time)  Medications Ordered in ED Medications  ondansetron (ZOFRAN-ODT) disintegrating tablet 4 mg (4 mg Oral Given 10/02/16 1532)  Initial Impression / Assessment and Plan / ED Course  I have reviewed the triage vital signs and the nursing notes.  Pertinent labs & imaging results that were available during my care of the patient were reviewed by me and considered in my medical decision making (see chart for details).  Clinical Course    The patient has a viral type illness based on myalgias, subjective fevers, sore throat and headaches. At this time she does not have a headache, she has had no pulmonary symptoms or coughing, she does have some dysuria and is concerned that she may be pregnant again despite having a urologic procedure to fix this in the past. I will obtain a urine pregnancy as well as a urinalysis and a strep test though her posterior pharynx appears benign. This could be a flulike illness that she has not had vaccinations but it is been more than 48 hours. Anticipate discharge.  Labs unremarkiable Appears c/w viral URI  Final Clinical Impressions(s) / ED Diagnoses   Final diagnoses:  Influenza-like illness    New Prescriptions New Prescriptions   IBUPROFEN (ADVIL,MOTRIN) 600 MG TABLET    Take 1 tablet (600 mg total) by mouth every 6 (six) hours as  needed.   ONDANSETRON (ZOFRAN ODT) 4 MG DISINTEGRATING TABLET    Take 1 tablet (4 mg total) by mouth every 8 (eight) hours as needed for nausea.     Eber HongBrian Cassadee Vanzandt, MD 10/02/16 [id number]

## 2016-10-02 NOTE — Discharge Instructions (Signed)
Ibuprofen 3 times daily Zofran for nausea Seek medical attention for severe or worsening pain / vomiting / fevers or difficulty breathing  Please obtain all of your results from medical records or have your doctors office obtain the results - share them with your doctor - you should be seen at your doctors office in the next 2 days. Call today to arrange your follow up. Take the medications as prescribed. Please review all of the medicines and only take them if you do not have an allergy to them. Please be aware that if you are taking birth control pills, taking other prescriptions, ESPECIALLY ANTIBIOTICS may make the birth control ineffective - if this is the case, either do not engage in sexual activity or use alternative methods of birth control such as condoms until you have finished the medicine and your family doctor says it is OK to restart them. If you are on a blood thinner such as COUMADIN, be aware that any other medicine that you take may cause the coumadin to either work too much, or not enough - you should have your coumadin level rechecked in next 7 days if this is the case.  ?  It is also a possibility that you have an allergic reaction to any of the medicines that you have been prescribed - Everybody reacts differently to medications and while MOST people have no trouble with most medicines, you may have a reaction such as nausea, vomiting, rash, swelling, shortness of breath. If this is the case, please stop taking the medicine immediately and contact your physician.  ?  You should return to the ER if you develop severe or worsening symptoms.

## 2016-10-02 NOTE — ED Triage Notes (Signed)
Pt presents with c/o body aches. She began to feel fatigued and nauseated a few days ago. Then today woke with sweats and generalized body aches. She reports fevers, congestion. She denies cough.

## 2016-10-02 NOTE — ED Notes (Signed)
Pt seems very jumpy and anxious when met in waiting room. Pt mumbling to herself while walking to room and while in room. Pt currently giving urine sample.

## 2016-10-05 LAB — CULTURE, GROUP A STREP (THRC)

## 2016-10-31 ENCOUNTER — Encounter (HOSPITAL_COMMUNITY): Payer: Self-pay | Admitting: Emergency Medicine

## 2016-10-31 ENCOUNTER — Emergency Department (HOSPITAL_COMMUNITY)
Admission: EM | Admit: 2016-10-31 | Discharge: 2016-10-31 | Disposition: A | Discharge status: Home / Self Care | Payer: Medicaid Other | Attending: Emergency Medicine | Admitting: Emergency Medicine

## 2016-10-31 DIAGNOSIS — K529 Noninfective gastroenteritis and colitis, unspecified: Secondary | ICD-10-CM | POA: Diagnosis not present

## 2016-10-31 DIAGNOSIS — Z3202 Encounter for pregnancy test, result negative: Secondary | ICD-10-CM | POA: Insufficient documentation

## 2016-10-31 DIAGNOSIS — R112 Nausea with vomiting, unspecified: Secondary | ICD-10-CM | POA: Diagnosis present

## 2016-10-31 DIAGNOSIS — N9489 Other specified conditions associated with female genital organs and menstrual cycle: Secondary | ICD-10-CM | POA: Diagnosis not present

## 2016-10-31 DIAGNOSIS — Z87891 Personal history of nicotine dependence: Secondary | ICD-10-CM | POA: Diagnosis not present

## 2016-10-31 LAB — URINALYSIS, ROUTINE W REFLEX MICROSCOPIC
Bilirubin Urine: NEGATIVE
Glucose, UA: NEGATIVE mg/dL
Hgb urine dipstick: NEGATIVE
Ketones, ur: NEGATIVE mg/dL
Leukocytes, UA: NEGATIVE
Nitrite: NEGATIVE
Protein, ur: NEGATIVE mg/dL
Specific Gravity, Urine: 1.018 (ref 1.005–1.030)
pH: 5 (ref 5.0–8.0)

## 2016-10-31 LAB — LIPASE, BLOOD: Lipase: 25 U/L (ref 11–51)

## 2016-10-31 LAB — COMPREHENSIVE METABOLIC PANEL
ALT: 15 U/L (ref 14–54)
AST: 16 U/L (ref 15–41)
Albumin: 3.2 g/dL — ABNORMAL LOW (ref 3.5–5.0)
Alkaline Phosphatase: 58 U/L (ref 38–126)
Anion gap: 6 (ref 5–15)
BUN: 10 mg/dL (ref 6–20)
CO2: 25 mmol/L (ref 22–32)
Calcium: 8 mg/dL — ABNORMAL LOW (ref 8.9–10.3)
Chloride: 108 mmol/L (ref 101–111)
Creatinine, Ser: 0.52 mg/dL (ref 0.44–1.00)
GFR calc Af Amer: >60 mL/min (ref >60)
GFR calc non Af Amer: >60 mL/min (ref >60)
Glucose, Bld: 94 mg/dL (ref 65–99)
Potassium: 3.8 mmol/L (ref 3.5–5.1)
Sodium: 139 mmol/L (ref 135–145)
Total Bilirubin: 0.6 mg/dL (ref 0.3–1.2)
Total Protein: 5.8 g/dL — ABNORMAL LOW (ref 6.5–8.1)

## 2016-10-31 LAB — CBC
HCT: 37.4 % (ref 36.0–46.0)
Hemoglobin: 13.6 g/dL (ref 12.0–15.0)
MCH: 27.3 pg (ref 26.0–34.0)
MCHC: 36.4 g/dL — ABNORMAL HIGH (ref 30.0–36.0)
MCV: 74.9 fL — ABNORMAL LOW (ref 78.0–100.0)
Platelets: 218 10*3/uL (ref 150–400)
RBC: 4.99 MIL/uL (ref 3.87–5.11)
RDW: 15.2 % (ref 11.5–15.5)
WBC: 8.4 10*3/uL (ref 4.0–10.5)

## 2016-10-31 LAB — HCG, QUANTITATIVE, PREGNANCY: hCG, Beta Chain, Quant, S: <1 m[IU]/mL (ref <5)

## 2016-10-31 MED ORDER — ONDANSETRON HCL 4 MG/2ML IJ SOLN
4.0000 mg | Freq: Once | INTRAMUSCULAR | Status: AC
  Administered 2016-10-31: 4 mg via INTRAVENOUS
  Filled 2016-10-31: qty 2

## 2016-10-31 MED ORDER — ONDANSETRON 8 MG PO TBDP: ORAL_TABLET | ORAL | 0 refills | Status: DC

## 2016-10-31 MED ORDER — SODIUM CHLORIDE 0.9 % IV BOLUS (SEPSIS)
1000.0000 mL | Freq: Once | INTRAVENOUS | Status: AC
  Administered 2016-10-31: 1000 mL via INTRAVENOUS

## 2016-10-31 MED ORDER — IBUPROFEN 800 MG PO TABS: 800.0000 mg | ORAL_TABLET | Freq: Once | ORAL | Status: DC

## 2016-10-31 NOTE — ED Provider Notes (Signed)
[facility]-EMERGENCY DEPT Provider Note   CSN: [id number] Arrival date & time: 10/31/16  1246     History   Chief Complaint Chief Complaint  Patient presents with  . Emesis    HPI Crist FatDominique V Fons is a 30 y.o. female.  Patient is a 30 year old female with history of tubal ligation 5 years ago and subsequent tubal pregnancy 1 year ago. She presents today for evaluation of nausea, vomiting, diarrhea that has worsened since yesterday. All has been nonbloody. She denies any fevers. She does state that she has not had a period since last month and is now 3 weeks late. She is concerned about a recurrent tubal pregnancy. She has not taken a pregnancy test at home because she states that her urine tests "have always come back negative even when I'm pregnant".      Past Medical History:  Diagnosis Date  . Anxiety   . Apnea, sleep    no CPAP use, states lost machine during move to [place]  . Bipolar 1 disorder (HCC)   . Bipolar disorder (HCC)    no current med.  . Depression   . History of MRSA infection 2010  . Irritable bowel syndrome (IBS)    no current med.  . Pelvic inflammatory disease   . Scalp laceration    staples to be removed 03/13/2014  . Ulnar fracture 03/09/2014   left    Patient Active Problem List   Diagnosis Date Noted  . Dysmenorrhea 05/28/2014  . GC (gonococcus) 05/28/2014  . Chlamydia contact 05/28/2014  . Pain 04/14/2014  . UTI symptoms 04/14/2014  . Routine general medical examination at a health care facility 09/26/2013  . Candidiasis of vulva and vagina 09/26/2013  . Unspecified symptom associated with female genital organs 09/10/2013  . Abnormal uterine bleeding (AUB) 09/10/2013  . PID (pelvic inflammatory disease) 07/29/2013  . Dysuria 07/29/2013  . Screening examination for venereal disease 07/29/2013  . Chronic PID (chronic pelvic inflammatory disease) 06/30/2013  . Excessive or frequent menstruation 06/30/2013  . Genital herpes, unspecified 06/30/2013   . Unspecified inflammatory disease of female pelvic organs and tissues 03/12/2013    Past Surgical History:  Procedure Laterality Date  . CYST EXCISION     middle of back  . ORIF ULNAR FRACTURE Left 03/16/2014   Procedure: OPEN REDUCTION INTERNAL FIXATION (ORIF) LEFT ULNAR FRACTURE;  Surgeon: Marlowe ShoresMatthew A Weingold, MD;  Location: [facility];  Service: Orthopedics;  Laterality: Left;  left  . OVARIAN CYST REMOVAL    . THYROID CYST EXCISION     nodule exc.  . TUBAL LIGATION      OB History    Gravida Para Term Preterm AB Living   5 4 4     4    SAB TAB Ectopic Multiple Live Births           4       Home Medications    Prior to Admission medications   Medication Sig Start Date End Date Taking? Authorizing Provider  esomeprazole (NEXIUM) 40 MG capsule Take 1 capsule (40 mg total) by mouth daily. Patient not taking: Reported on 08/21/2016 08/21/16   Arthor CaptainAbigail Harris, PA-C  ibuprofen (ADVIL,MOTRIN) 600 MG tablet Take 1 tablet (600 mg total) by mouth every 6 (six) hours as needed. 10/02/16   Eber HongBrian Miller, MD  ondansetron (ZOFRAN ODT) 4 MG disintegrating tablet Take 1 tablet (4 mg total) by mouth every 8 (eight) hours as needed for nausea. 10/02/16   Eber HongBrian Miller, MD  phenazopyridine (PYRIDIUM) 100 MG tablet Take 1 tablet (100 mg total) by mouth 3 (three) times daily as needed for pain. 09/11/16   Armando ReichertHeather D Hogan, CNM    Family History Family History  Problem Relation Age of Onset  . Heart disease Mother   . Cancer Maternal Grandmother     breast  . Cancer Paternal Grandmother     breast    Social History Social History  Substance Use Topics  . Smoking status: Former Smoker    Years: 3.00    Types: Cigarettes  . Smokeless tobacco: Never Used     Comment: 1-2 cig./day  . Alcohol use 0.0 oz/week     Allergies   Contrast media [iodinated diagnostic agents]; Sulfa antibiotics; Toradol [ketorolac tromethamine]; Bee venom; Flagyl [metronidazole]; Naproxen; and  Bactrim [sulfamethoxazole-trimethoprim]   Review of Systems Review of Systems  All other systems reviewed and are negative.    Physical Exam Updated Vital Signs BP 143/85 (BP Location: Left Arm)   Pulse 79   Temp 98.1 F (36.7 C) (Oral)   Resp 18   Wt 168 lb 3.2 oz (76.3 kg)   SpO2 100%   BMI 27.15 kg/m   Physical Exam  Constitutional: She is oriented to person, place, and time. She appears well-developed and well-nourished. No distress.  HENT:  Head: Normocephalic and atraumatic.  Neck: Normal range of motion. Neck supple.  Cardiovascular: Normal rate and regular rhythm.  Exam reveals no gallop and no friction rub.   No murmur heard. Pulmonary/Chest: Effort normal and breath sounds normal. No respiratory distress. She has no wheezes.  Abdominal: Soft. Bowel sounds are normal. She exhibits no distension. There is no tenderness.  Musculoskeletal: Normal range of motion.  Neurological: She is alert and oriented to person, place, and time.  Skin: Skin is warm and dry. She is not diaphoretic.  Nursing note and vitals reviewed.    ED Treatments / Results  Labs (all labs ordered are listed, but only abnormal results are displayed) Labs Reviewed  LIPASE, BLOOD  COMPREHENSIVE METABOLIC PANEL  CBC  URINALYSIS, ROUTINE W REFLEX MICROSCOPIC  HCG, QUANTITATIVE, PREGNANCY    EKG  EKG Interpretation None       Radiology No results found.  Procedures Procedures (including critical care time)  Medications Ordered in ED Medications  ondansetron (ZOFRAN) injection 4 mg (not administered)  sodium chloride 0.9 % bolus 1,000 mL (not administered)     Initial Impression / Assessment and Plan / ED Course  I have reviewed the triage vital signs and the nursing notes.  Pertinent labs & imaging results that were available during my care of the patient were reviewed by me and considered in my medical decision making (see chart for details).  Clinical Course      Laboratory studies are unremarkable and pregnancy test is negative. I highly suspect a viral etiology. Her abdomen is benign and laboratory studies are reassuring. She will be discharged with Zofran and when necessary return.  Final Clinical Impressions(s) / ED Diagnoses   Final diagnoses:  None    New Prescriptions New Prescriptions   No medications on file     Geoffery Lyonsouglas Vernon Maish, MD 10/31/16 1427

## 2016-10-31 NOTE — ED Triage Notes (Signed)
Pt reports emesis x 2 days, st had pregnancy last year despite tubal ligation. sts had 2 cycles during November. She  Is late 3 weeks , she said. denies abd pain , yet left lower back pain. Reports polyuria and dysuria.

## 2016-10-31 NOTE — Discharge Instructions (Signed)
Zofran as prescribed as needed for nausea.  Clear liquid diet as tolerated for the next 12 hours, then slowly advance to normal.  Return to the emergency department for severe abdominal pain, bloody stools, or other new and concerning symptoms

## 2016-11-15 ENCOUNTER — Emergency Department (HOSPITAL_COMMUNITY)
Admission: EM | Admit: 2016-11-15 | Discharge: 2016-11-15 | Disposition: A | Discharge status: Home / Self Care | Payer: Medicaid Other | Attending: Emergency Medicine | Admitting: Emergency Medicine

## 2016-11-15 ENCOUNTER — Emergency Department (HOSPITAL_COMMUNITY): Payer: Medicaid Other

## 2016-11-15 ENCOUNTER — Encounter (HOSPITAL_COMMUNITY): Payer: Self-pay | Admitting: Adult Health

## 2016-11-15 DIAGNOSIS — Y929 Unspecified place or not applicable: Secondary | ICD-10-CM | POA: Insufficient documentation

## 2016-11-15 DIAGNOSIS — J3489 Other specified disorders of nose and nasal sinuses: Secondary | ICD-10-CM | POA: Diagnosis not present

## 2016-11-15 DIAGNOSIS — Y939 Activity, unspecified: Secondary | ICD-10-CM | POA: Insufficient documentation

## 2016-11-15 DIAGNOSIS — S0512XA Contusion of eyeball and orbital tissues, left eye, initial encounter: Secondary | ICD-10-CM | POA: Diagnosis not present

## 2016-11-15 DIAGNOSIS — Y999 Unspecified external cause status: Secondary | ICD-10-CM | POA: Diagnosis not present

## 2016-11-15 DIAGNOSIS — S0990XA Unspecified injury of head, initial encounter: Secondary | ICD-10-CM | POA: Diagnosis not present

## 2016-11-15 MED ORDER — IBUPROFEN 800 MG PO TABS
800.0000 mg | ORAL_TABLET | Freq: Once | ORAL | Status: AC
  Administered 2016-11-15: 800 mg via ORAL
  Filled 2016-11-15: qty 1

## 2016-11-15 MED ORDER — ACETAMINOPHEN 325 MG PO TABS
650.0000 mg | ORAL_TABLET | Freq: Once | ORAL | Status: DC
  Filled 2016-11-15: qty 2

## 2016-11-15 MED ORDER — IBUPROFEN 800 MG PO TABS: 800.0000 mg | ORAL_TABLET | Freq: Three times a day (TID) | ORAL | 0 refills | Status: DC

## 2016-11-15 MED ORDER — ONDANSETRON 4 MG PO TBDP
8.0000 mg | ORAL_TABLET | Freq: Once | ORAL | Status: AC
  Administered 2016-11-15: 8 mg via ORAL
  Filled 2016-11-15: qty 2

## 2016-11-15 NOTE — ED Notes (Signed)
See EDP assessment 

## 2016-11-15 NOTE — ED Notes (Signed)
Pt requesting pain & nausea medicine. EDP aware
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
## 2016-11-15 NOTE — ED Notes (Signed)
Pt became agitated about the Tylenol pain medication that was offered. Pt requested a different medicine. Other medications given.
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
## 2016-11-15 NOTE — ED Notes (Signed)
Pt. Took off her c-collar, stated, "I do not want to wear this,"  Encouraged pt. To stay and also to leave on her c-collar.  She will stay a little longer, but pt. Took off her c-collar

## 2016-11-15 NOTE — ED Notes (Signed)
Patient transported to CT 

## 2016-11-19 ENCOUNTER — Encounter (HOSPITAL_COMMUNITY): Payer: Self-pay

## 2016-11-19 ENCOUNTER — Emergency Department (HOSPITAL_COMMUNITY)
Admission: EM | Admit: 2016-11-19 | Discharge: 2016-11-19 | Disposition: A | Discharge status: Home / Self Care | Payer: Medicaid Other | Attending: Dermatology | Admitting: Dermatology

## 2016-11-19 DIAGNOSIS — H5710 Ocular pain, unspecified eye: Secondary | ICD-10-CM | POA: Diagnosis not present

## 2016-11-19 DIAGNOSIS — N898 Other specified noninflammatory disorders of vagina: Secondary | ICD-10-CM | POA: Insufficient documentation

## 2016-11-19 DIAGNOSIS — Z5321 Procedure and treatment not carried out due to patient leaving prior to being seen by health care provider: Secondary | ICD-10-CM | POA: Insufficient documentation
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
## 2016-11-19 NOTE — ED Notes (Signed)
This RN notified that patient is disgruntled that staff cannot promise to have pt seen and discharged before the last bus runs. Consulting civil engineerCharge RN and other triage RN have spoken with pt multiple times regarding situation. Pt has been told due to volume staff cannot give a specific estimate of time. Pt given patient relations number and reports she is leaving without being seen.

## 2016-11-20 ENCOUNTER — Encounter (HOSPITAL_COMMUNITY): Payer: Self-pay

## 2016-11-20 ENCOUNTER — Emergency Department (HOSPITAL_COMMUNITY)
Admission: EM | Admit: 2016-11-20 | Discharge: 2016-11-21 | Disposition: A | Discharge status: Home / Self Care | Payer: Medicaid Other | Attending: Emergency Medicine | Admitting: Emergency Medicine

## 2016-11-20 DIAGNOSIS — Y929 Unspecified place or not applicable: Secondary | ICD-10-CM | POA: Diagnosis not present

## 2016-11-20 DIAGNOSIS — Y999 Unspecified external cause status: Secondary | ICD-10-CM | POA: Insufficient documentation

## 2016-11-20 DIAGNOSIS — N898 Other specified noninflammatory disorders of vagina: Secondary | ICD-10-CM

## 2016-11-20 DIAGNOSIS — Z87891 Personal history of nicotine dependence: Secondary | ICD-10-CM | POA: Diagnosis not present

## 2016-11-20 DIAGNOSIS — N72 Inflammatory disease of cervix uteri: Secondary | ICD-10-CM

## 2016-11-20 DIAGNOSIS — R51 Headache: Secondary | ICD-10-CM | POA: Insufficient documentation

## 2016-11-20 DIAGNOSIS — H5712 Ocular pain, left eye: Secondary | ICD-10-CM | POA: Diagnosis present

## 2016-11-20 DIAGNOSIS — Y939 Activity, unspecified: Secondary | ICD-10-CM | POA: Diagnosis not present

## 2016-11-20 DIAGNOSIS — H1132 Conjunctival hemorrhage, left eye: Secondary | ICD-10-CM | POA: Insufficient documentation

## 2016-11-20 DIAGNOSIS — Z79899 Other long term (current) drug therapy: Secondary | ICD-10-CM | POA: Diagnosis not present

## 2016-11-20 DIAGNOSIS — R519 Headache, unspecified: Secondary | ICD-10-CM

## 2016-11-20 LAB — POC URINE PREG, ED: Preg Test, Ur: NEGATIVE

## 2016-11-20 LAB — WET PREP, GENITAL
Sperm: NONE SEEN
Trich, Wet Prep: NONE SEEN
Yeast Wet Prep HPF POC: NONE SEEN

## 2016-11-20 MED ORDER — METOCLOPRAMIDE HCL 10 MG PO TABS
10.0000 mg | ORAL_TABLET | Freq: Once | ORAL | Status: AC
  Administered 2016-11-20: 10 mg via ORAL
  Filled 2016-11-20: qty 1

## 2016-11-20 MED ORDER — AZITHROMYCIN 250 MG PO TABS
1000.0000 mg | ORAL_TABLET | Freq: Once | ORAL | Status: AC
  Administered 2016-11-20: 1000 mg via ORAL
  Filled 2016-11-20: qty 4

## 2016-11-20 MED ORDER — CEFTRIAXONE SODIUM 250 MG IJ SOLR
250.0000 mg | Freq: Once | INTRAMUSCULAR | Status: AC
  Administered 2016-11-20: 250 mg via INTRAMUSCULAR
  Filled 2016-11-20: qty 250

## 2016-11-20 MED ORDER — MORPHINE SULFATE (PF) 4 MG/ML IV SOLN
4.0000 mg | Freq: Once | INTRAVENOUS | Status: AC
  Administered 2016-11-20: 4 mg via INTRAMUSCULAR
  Filled 2016-11-20: qty 1

## 2016-11-20 MED ORDER — STERILE WATER FOR INJECTION IJ SOLN
INTRAMUSCULAR | Status: AC
  Administered 2016-11-20: 2 mL
  Filled 2016-11-20: qty 10

## 2016-11-20 MED ORDER — OXYCODONE HCL 5 MG PO TABS
10.0000 mg | ORAL_TABLET | ORAL | Status: AC
  Administered 2016-11-20: 10 mg via ORAL
  Filled 2016-11-20: qty 2
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
## 2016-11-20 NOTE — ED Notes (Signed)
Pt ambulatory to restroom
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
## 2016-11-20 NOTE — Discharge Instructions (Signed)
You have been treated for presumed STD if cultures are positive you will be notified As far as the headache pain goes if you take regular medications( alternate Ibuprofen and Percocet every 4 hours) for a day than every 5 hours for a day than as needed you will get better control.

## 2016-11-20 NOTE — ED Notes (Signed)
Pt ambulatory and independent at discharge.  Verbalized understanding of discharge instructions 

## 2016-11-21 LAB — HIV ANTIBODY (ROUTINE TESTING W REFLEX): HIV Screen 4th Generation wRfx: NONREACTIVE

## 2016-11-21 LAB — RPR: RPR Ser Ql: NONREACTIVE

## 2016-11-22 LAB — GC/CHLAMYDIA PROBE AMP (~~LOC~~) NOT AT ARMC
Chlamydia: NEGATIVE
Neisseria Gonorrhea: NEGATIVE

## 2016-12-05 ENCOUNTER — Encounter (HOSPITAL_COMMUNITY): Payer: Self-pay | Admitting: Emergency Medicine

## 2016-12-05 ENCOUNTER — Ambulatory Visit (HOSPITAL_COMMUNITY)
Admission: EM | Admit: 2016-12-05 | Discharge: 2016-12-05 | Disposition: A | Discharge status: Home / Self Care | Payer: Medicaid Other | Attending: Family Medicine | Admitting: Family Medicine

## 2016-12-05 DIAGNOSIS — G44319 Acute post-traumatic headache, not intractable: Secondary | ICD-10-CM

## 2016-12-05 MED ORDER — DICLOFENAC SODIUM 75 MG PO TBEC: 75.0000 mg | DELAYED_RELEASE_TABLET | Freq: Two times a day (BID) | ORAL | 0 refills | Status: DC

## 2016-12-05 MED ORDER — ONDANSETRON 4 MG PO TBDP: 4.0000 mg | ORAL_TABLET | Freq: Three times a day (TID) | ORAL | 0 refills | Status: DC | PRN
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
## 2016-12-05 NOTE — Discharge Instructions (Signed)
I have sent two medicines to your pharmacy for your headache. Diclofenac, take 1 tablet, twice a day as needed and Zofran for nausea, place one tablet under the tongue every 8 hours as needed. I would encourage you to keep your appointment with your primary care provider for further evaluation of your symptoms. Should your headache worsen, develop a fever, neck stiffness, weakness, loose consciousness, or have other concerning symptoms I recommend you follow up at the emergency room.

## 2016-12-11 ENCOUNTER — Emergency Department (HOSPITAL_COMMUNITY): Payer: Medicaid Other

## 2016-12-11 ENCOUNTER — Encounter (HOSPITAL_COMMUNITY): Payer: Self-pay

## 2016-12-11 ENCOUNTER — Emergency Department (HOSPITAL_COMMUNITY)
Admission: EM | Admit: 2016-12-11 | Discharge: 2016-12-11 | Disposition: A | Discharge status: Home / Self Care | Payer: Medicaid Other | Attending: Emergency Medicine | Admitting: Emergency Medicine

## 2016-12-11 DIAGNOSIS — X509XXA Other and unspecified overexertion or strenuous movements or postures, initial encounter: Secondary | ICD-10-CM | POA: Diagnosis not present

## 2016-12-11 DIAGNOSIS — Z87891 Personal history of nicotine dependence: Secondary | ICD-10-CM | POA: Insufficient documentation

## 2016-12-11 DIAGNOSIS — N760 Acute vaginitis: Secondary | ICD-10-CM | POA: Insufficient documentation

## 2016-12-11 DIAGNOSIS — B9689 Other specified bacterial agents as the cause of diseases classified elsewhere: Secondary | ICD-10-CM

## 2016-12-11 DIAGNOSIS — Y9389 Activity, other specified: Secondary | ICD-10-CM | POA: Diagnosis not present

## 2016-12-11 DIAGNOSIS — Y9289 Other specified places as the place of occurrence of the external cause: Secondary | ICD-10-CM | POA: Insufficient documentation

## 2016-12-11 DIAGNOSIS — S6991XA Unspecified injury of right wrist, hand and finger(s), initial encounter: Secondary | ICD-10-CM | POA: Diagnosis present

## 2016-12-11 DIAGNOSIS — Y999 Unspecified external cause status: Secondary | ICD-10-CM | POA: Insufficient documentation

## 2016-12-11 DIAGNOSIS — S63501A Unspecified sprain of right wrist, initial encounter: Secondary | ICD-10-CM | POA: Insufficient documentation

## 2016-12-11 MED ORDER — TRAMADOL HCL 50 MG PO TABS
50.0000 mg | ORAL_TABLET | Freq: Once | ORAL | Status: DC
  Filled 2016-12-11: qty 1

## 2016-12-11 MED ORDER — METRONIDAZOLE 0.75 % VA GEL: 1.0000 | Freq: Two times a day (BID) | VAGINAL | 0 refills | Status: DC

## 2016-12-11 MED ORDER — IBUPROFEN 400 MG PO TABS
600.0000 mg | ORAL_TABLET | Freq: Once | ORAL | Status: AC
  Administered 2016-12-11: 600 mg via ORAL
  Filled 2016-12-11: qty 1
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
## 2016-12-18 ENCOUNTER — Emergency Department (HOSPITAL_COMMUNITY)
Admission: EM | Admit: 2016-12-18 | Discharge: 2016-12-18 | Discharge status: Against Medical Advice | Payer: Medicaid Other

## 2016-12-18 NOTE — ED Notes (Signed)
With attempt to triage pt pt refuses stating she is leaving because "wait is too long." Pt informed risks of leaving without assessment.

## 2016-12-25 ENCOUNTER — Encounter (HOSPITAL_COMMUNITY): Payer: Self-pay | Admitting: *Deleted

## 2016-12-25 ENCOUNTER — Emergency Department (HOSPITAL_COMMUNITY)
Admission: EM | Admit: 2016-12-25 | Discharge: 2016-12-25 | Disposition: A | Discharge status: Home / Self Care | Payer: Medicaid Other | Attending: Emergency Medicine | Admitting: Emergency Medicine

## 2016-12-25 ENCOUNTER — Emergency Department (HOSPITAL_COMMUNITY): Payer: Medicaid Other

## 2016-12-25 DIAGNOSIS — Z79899 Other long term (current) drug therapy: Secondary | ICD-10-CM | POA: Diagnosis not present

## 2016-12-25 DIAGNOSIS — R102 Pelvic and perineal pain: Secondary | ICD-10-CM | POA: Insufficient documentation

## 2016-12-25 DIAGNOSIS — Z87891 Personal history of nicotine dependence: Secondary | ICD-10-CM | POA: Diagnosis not present

## 2016-12-25 DIAGNOSIS — N946 Dysmenorrhea, unspecified: Secondary | ICD-10-CM | POA: Diagnosis not present

## 2016-12-25 DIAGNOSIS — N939 Abnormal uterine and vaginal bleeding, unspecified: Secondary | ICD-10-CM | POA: Diagnosis present

## 2016-12-25 LAB — I-STAT CHEM 8, ED
BUN: 13 mg/dL (ref 6–20)
Calcium, Ion: 1.17 mmol/L (ref 1.15–1.40)
Chloride: 105 mmol/L (ref 101–111)
Creatinine, Ser: 0.5 mg/dL (ref 0.44–1.00)
Glucose, Bld: 98 mg/dL (ref 65–99)
HCT: 36 % (ref 36.0–46.0)
Hemoglobin: 12.2 g/dL (ref 12.0–15.0)
Potassium: 3.8 mmol/L (ref 3.5–5.1)
Sodium: 138 mmol/L (ref 135–145)
TCO2: 24 mmol/L (ref 0–100)

## 2016-12-25 LAB — URINALYSIS, ROUTINE W REFLEX MICROSCOPIC
Bilirubin Urine: NEGATIVE
Glucose, UA: NEGATIVE mg/dL
Hgb urine dipstick: NEGATIVE
Ketones, ur: NEGATIVE mg/dL
Leukocytes, UA: NEGATIVE
Nitrite: NEGATIVE
Protein, ur: NEGATIVE mg/dL
Specific Gravity, Urine: 1.01 (ref 1.005–1.030)
pH: 7 (ref 5.0–8.0)

## 2016-12-25 LAB — WET PREP, GENITAL
Sperm: NONE SEEN
Trich, Wet Prep: NONE SEEN
Yeast Wet Prep HPF POC: NONE SEEN

## 2016-12-25 LAB — I-STAT BETA HCG BLOOD, ED (MC, WL, AP ONLY): I-stat hCG, quantitative: <5 m[IU]/mL (ref <5)

## 2016-12-25 MED ORDER — IBUPROFEN 600 MG PO TABS: 600.0000 mg | ORAL_TABLET | Freq: Four times a day (QID) | ORAL | 0 refills | Status: DC | PRN

## 2016-12-25 MED ORDER — ONDANSETRON HCL 4 MG/2ML IJ SOLN
4.0000 mg | Freq: Once | INTRAMUSCULAR | Status: AC
  Administered 2016-12-25: 4 mg via INTRAVENOUS
  Filled 2016-12-25: qty 2

## 2016-12-25 MED ORDER — FENTANYL CITRATE (PF) 100 MCG/2ML IJ SOLN
25.0000 ug | Freq: Once | INTRAMUSCULAR | Status: AC
  Administered 2016-12-25: 25 ug via INTRAVENOUS
  Filled 2016-12-25: qty 2
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
## 2016-12-25 NOTE — ED Triage Notes (Signed)
Pt reports burning in her vagina that started Saturday night. Pt says that she is also having abdominal pressure, vaginal bleeding and blood in her urine.

## 2016-12-25 NOTE — ED Provider Notes (Signed)
[facility]-EMERGENCY DEPT Provider Note   CSN: [id number] Arrival date & time: 12/25/16  0126  By signing my name below, I, Lyndon Code., attest that this documentation has been prepared under the direction and in the presence of [employer], PA-C. Electronically Signed: Orpah Cobb , ED Scribe. 12/25/16. 4:57 AM.   History   Chief Complaint Chief Complaint  Patient presents with  . Vaginal Bleeding    HPI  Leah Barry is a 31 y.o. female with PSHx of ovarian cyst x2 and ectopic pregnancy who presents to the Emergency Department complaining of moderate dysuria and vaginal bleeding with sudden onset x2 days. Pt states that Saturday evening when she went to use the restroom she noticed a burning sensation along with dark blood. She states that she has also had a L sided pressure. She states the pain is similar to her hx of ovarian cysts but today is worse. She reports chills, nausea, frequency. She has taken Ibuprofen with no relief. Pt denies vomiting, diarrhea. Of note, pt has reportedly had 3 unprotected sexual partners in the past x6 months. Pt's last menstrual was x1 month ago.   HPI  Past Medical History:  Diagnosis Date  . Anxiety   . Apnea, sleep    no CPAP use, states lost machine during move to [place]  . Bipolar 1 disorder (HCC)   . Bipolar disorder (HCC)    no current med.  . Depression   . History of MRSA infection 2010  . Irritable bowel syndrome (IBS)    no current med.  . Pelvic inflammatory disease   . Scalp laceration    staples to be removed 03/13/2014  . Ulnar fracture 03/09/2014   left    Patient Active Problem List   Diagnosis Date Noted  . Dysmenorrhea 05/28/2014  . GC (gonococcus) 05/28/2014  . Chlamydia contact 05/28/2014  . Pain 04/14/2014  . UTI symptoms 04/14/2014  . Routine general medical examination at a health care facility 09/26/2013  . Candidiasis of vulva and vagina 09/26/2013  . Unspecified symptom associated with female genital  organs 09/10/2013  . Abnormal uterine bleeding (AUB) 09/10/2013  . PID (pelvic inflammatory disease) 07/29/2013  . Dysuria 07/29/2013  . Screening examination for venereal disease 07/29/2013  . Chronic PID (chronic pelvic inflammatory disease) 06/30/2013  . Excessive or frequent menstruation 06/30/2013  . Genital herpes, unspecified 06/30/2013  . Unspecified inflammatory disease of female pelvic organs and tissues 03/12/2013    Past Surgical History:  Procedure Laterality Date  . CYST EXCISION     middle of back  . ORIF ULNAR FRACTURE Left 03/16/2014   Procedure: OPEN REDUCTION INTERNAL FIXATION (ORIF) LEFT ULNAR FRACTURE;  Surgeon: Marlowe Shores, MD;  Location: [facility];  Service: Orthopedics;  Laterality: Left;  left  . OVARIAN CYST REMOVAL    . THYROID CYST EXCISION     nodule exc.  . TUBAL LIGATION      OB History    Gravida Para Term Preterm AB Living   5 4 4     4    SAB TAB Ectopic Multiple Live Births           4       Home Medications    Prior to Admission medications   Medication Sig Start Date End Date Taking? Authorizing Provider  diclofenac (VOLTAREN) 75 MG EC tablet Take 1 tablet (75 mg total) by mouth 2 (two) times daily. Patient not taking: Reported on 12/25/2016 12/05/16  Dorena Bodo, NP  ibuprofen (ADVIL,MOTRIN) 600 MG tablet Take 1 tablet (600 mg total) by mouth every 6 (six) hours as needed. 12/25/16   Antony Madura, PA-C  metroNIDAZOLE (METROGEL VAGINAL) 0.75 % vaginal gel Place 1 Applicatorful vaginally 2 (two) times daily. Patient not taking: Reported on 12/25/2016 12/11/16   Raeford Razor, MD  ondansetron (ZOFRAN ODT) 4 MG disintegrating tablet Take 1 tablet (4 mg total) by mouth every 8 (eight) hours as needed for nausea or vomiting. Patient not taking: Reported on 12/25/2016 12/05/16   Dorena Bodo, NP    Family History Family History  Problem Relation Age of Onset  . Heart disease Mother   . Cancer Maternal Grandmother       breast  . Cancer Paternal Grandmother     breast    Social History Social History  Substance Use Topics  . Smoking status: Former Smoker    Years: 3.00    Types: Cigarettes  . Smokeless tobacco: Never Used     Comment: 1-2 cig./day  . Alcohol use 0.0 oz/week     Allergies   Contrast media [iodinated diagnostic agents]; Sulfa antibiotics; Toradol [ketorolac tromethamine]; Bee venom; Flagyl [metronidazole]; Naproxen; Bactrim [sulfamethoxazole-trimethoprim]; and Tramadol   Review of Systems Review of Systems  Constitutional: Positive for chills. Negative for fever.  Gastrointestinal: Positive for nausea. Negative for diarrhea and vomiting.  Genitourinary: Positive for dysuria, frequency and vaginal bleeding.  Ten systems reviewed and are negative for acute change, except as noted in the HPI.     Physical Exam Updated Vital Signs BP 122/86   Pulse 80   Temp 99 F (37.2 C)   Ht 5\' 7"  (1.702 m)   Wt 77.1 kg   LMP 11/26/2016 (Approximate)   SpO2 99%   BMI 26.63 kg/m   Physical Exam  Constitutional: She is oriented to person, place, and time. She appears well-developed and well-nourished. No distress.  Nontoxic and in NAD  HENT:  Head: Normocephalic and atraumatic.  Mouth/Throat: Oropharynx is clear and moist.  Eyes: Conjunctivae and EOM are normal. No scleral icterus.  Neck: Normal range of motion.  Cardiovascular: Normal rate, regular rhythm and intact distal pulses.   Pulmonary/Chest: Effort normal and breath sounds normal. No respiratory distress. She has no wheezes. She has no rales.  Lungs CTAB  Abdominal: Soft. She exhibits no distension and no mass. There is tenderness. There is no guarding.  Soft, obese abdomen. There is LLQ tenderness without guarding. No peritoneal signs.  Genitourinary: There is no rash, tenderness, lesion or injury on the right labia. There is no rash, tenderness, lesion or injury on the left labia. Uterus is tender (minimal). Cervix  exhibits no motion tenderness and no friability. Right adnexum displays no mass and no tenderness. Left adnexum displays tenderness. Left adnexum displays no mass. There is bleeding (c/w menses) in the vagina. No foreign body in the vagina. No signs of injury around the vagina. No vaginal discharge found.  Musculoskeletal: Normal range of motion. She exhibits no edema.  Neurological: She is alert and oriented to person, place, and time. She exhibits normal muscle tone. Coordination normal.  GCS 15. Patient moving all extremities.  Skin: Skin is warm and dry. No rash noted. She is not diaphoretic. No erythema. No pallor.  Psychiatric: She has a normal mood and affect. Her behavior is normal.  Nursing note and vitals reviewed.    ED Treatments / Results  Labs (all labs ordered are listed, but only abnormal results are displayed)  Labs Reviewed  WET PREP, GENITAL - Abnormal; Notable for the following:       Result Value   Clue Cells Wet Prep HPF POC PRESENT (*)    WBC, Wet Prep HPF POC RARE (*)    All other components within normal limits  URINALYSIS, ROUTINE W REFLEX MICROSCOPIC - Abnormal; Notable for the following:    Color, Urine STRAW (*)    All other components within normal limits  I-STAT BETA HCG BLOOD, ED ([facility], [facility], [facility] ONLY)  I-STAT CHEM 8, ED  GC/CHLAMYDIA PROBE AMP ([place]) NOT AT [facility]    EKG  EKG Interpretation None       Radiology US Transvaginal Non-ob  Result Date: 12/25/2016 CLINICAL DATA:  Vaginal bleeding x2 days, pelvic pain. History of pelvic inflammatory disease, history of ectopic and ovarian cyst removal. EXAM: TRANSABDOMINAL AND TRANSVAGINAL ULTRASOUND OF PELVIS TECHNIQUE: Both transabdominal and transvaginal ultrasound examinations of the pelvis were performed. Transabdominal technique was performed for global imaging of the pelvis including uterus, ovaries, adnexal regions, and pelvic cul-de-sac. It was necessary to proceed with endovaginal exam following  the transabdominal exam to visualize the endometrium and ovaries. COMPARISON:  08/21/2016 CT FINDINGS: Uterus Measurements: 10.2 x 4.9 x 5.8 cm. No fibroids or other mass visualized. The uterus is slightly retroverted. A cervical nabothian cyst is of incidental note. Endometrium Thickness: 7 mm. No focal abnormality visualized. The endometrium is homogeneous in appearance. Right ovary Measurements: 3.2 x 2.4 x 2.4 cm. Normal appearance/no adnexal mass. Left ovary Measurements: 2.7 x 1.8 x 2 cm. Normal appearance/no adnexal mass. Other findings Trace physiologic free fluid. IMPRESSION: Unremarkable pelvic ultrasound. Electronically Signed   By: Tollie Eth M.D.   On: 12/25/2016 03:49   US Pelvis Complete  Result Date: 12/25/2016 CLINICAL DATA:  Vaginal bleeding x2 days, pelvic pain. History of pelvic inflammatory disease, history of ectopic and ovarian cyst removal. EXAM: TRANSABDOMINAL AND TRANSVAGINAL ULTRASOUND OF PELVIS TECHNIQUE: Both transabdominal and transvaginal ultrasound examinations of the pelvis were performed. Transabdominal technique was performed for global imaging of the pelvis including uterus, ovaries, adnexal regions, and pelvic cul-de-sac. It was necessary to proceed with endovaginal exam following the transabdominal exam to visualize the endometrium and ovaries. COMPARISON:  08/21/2016 CT FINDINGS: Uterus Measurements: 10.2 x 4.9 x 5.8 cm. No fibroids or other mass visualized. The uterus is slightly retroverted. A cervical nabothian cyst is of incidental note. Endometrium Thickness: 7 mm. No focal abnormality visualized. The endometrium is homogeneous in appearance. Right ovary Measurements: 3.2 x 2.4 x 2.4 cm. Normal appearance/no adnexal mass. Left ovary Measurements: 2.7 x 1.8 x 2 cm. Normal appearance/no adnexal mass. Other findings Trace physiologic free fluid. IMPRESSION: Unremarkable pelvic ultrasound. Electronically Signed   By: Tollie Eth M.D.   On: 12/25/2016 03:49     Procedures Procedures (including critical care time)  Medications Ordered in ED Medications  fentaNYL (SUBLIMAZE) injection 25 mcg (25 mcg Intravenous Given 12/25/16 0242)  ondansetron (ZOFRAN) injection 4 mg (4 mg Intravenous Given 12/25/16 0242)     Initial Impression / Assessment and Plan / ED Course  I have reviewed the triage vital signs and the nursing notes.  Pertinent labs & imaging results that were available during my care of the patient were reviewed by me and considered in my medical decision making (see chart for details).     31 year old female presents to the emergency department for evaluation of left lower abdominal pain in the presence of vaginal bleeding. She also reports burning  dysuria, though her urine today does not suggest urinary tract infection. Patient with reassuring hemoglobin and hematocrit. Pregnancy is negative.   The patient does have a history of risky sexual behavior, though she has no cervical motion tenderness or fever. No significant white blood cells on wet prep. Ultrasound was obtained to rule out TOA and ovarian torsion as cause of pain. Pelvic ultrasound is reassuring today, without evidence of acute process. No evidence of ovarian cysts.  On reassessment, the patient was found to be resting comfortably. She reports improvement in her symptoms after receiving a small dose of fentanyl. She requests ibuprofen at discharge for management of her pain. This will be provided. The patient has been instructed to follow-up with an OB/GYN for persistent symptoms. Return precautions given at discharge. Patient discharged in stable condition with no unaddressed concerns.   Final Clinical Impressions(s) / ED Diagnoses   Final diagnoses:  Pelvic pain  Dysmenorrhea    New Prescriptions New Prescriptions   IBUPROFEN (ADVIL,MOTRIN) 600 MG TABLET    Take 1 tablet (600 mg total) by mouth every 6 (six) hours as needed.     Antony MaduraKelly Marselino Slayton, PA-C 12/25/16  0459    Gilda Creasehristopher J Pollina, MD 12/26/16 [phone]

## 2016-12-26 ENCOUNTER — Ambulatory Visit (INDEPENDENT_AMBULATORY_CARE_PROVIDER_SITE_OTHER): Payer: Medicaid Other | Admitting: Obstetrics

## 2016-12-26 ENCOUNTER — Encounter: Payer: Self-pay | Admitting: Obstetrics

## 2016-12-26 VITALS — BP 126/82 | HR 91 | Wt 186.2 lb

## 2016-12-26 DIAGNOSIS — Z3202 Encounter for pregnancy test, result negative: Secondary | ICD-10-CM | POA: Diagnosis not present

## 2016-12-26 DIAGNOSIS — Z3042 Encounter for surveillance of injectable contraceptive: Secondary | ICD-10-CM

## 2016-12-26 DIAGNOSIS — Z1151 Encounter for screening for human papillomavirus (HPV): Secondary | ICD-10-CM | POA: Diagnosis not present

## 2016-12-26 DIAGNOSIS — Z01419 Encounter for gynecological examination (general) (routine) without abnormal findings: Secondary | ICD-10-CM | POA: Diagnosis not present

## 2016-12-26 DIAGNOSIS — B3731 Acute candidiasis of vulva and vagina: Secondary | ICD-10-CM

## 2016-12-26 DIAGNOSIS — Z124 Encounter for screening for malignant neoplasm of cervix: Secondary | ICD-10-CM | POA: Diagnosis not present

## 2016-12-26 DIAGNOSIS — N939 Abnormal uterine and vaginal bleeding, unspecified: Secondary | ICD-10-CM

## 2016-12-26 DIAGNOSIS — N760 Acute vaginitis: Secondary | ICD-10-CM

## 2016-12-26 DIAGNOSIS — Z0000 Encounter for general adult medical examination without abnormal findings: Secondary | ICD-10-CM

## 2016-12-26 DIAGNOSIS — N390 Urinary tract infection, site not specified: Secondary | ICD-10-CM

## 2016-12-26 DIAGNOSIS — N946 Dysmenorrhea, unspecified: Secondary | ICD-10-CM

## 2016-12-26 DIAGNOSIS — Z01411 Encounter for gynecological examination (general) (routine) with abnormal findings: Secondary | ICD-10-CM

## 2016-12-26 DIAGNOSIS — B373 Candidiasis of vulva and vagina: Secondary | ICD-10-CM

## 2016-12-26 DIAGNOSIS — B9689 Other specified bacterial agents as the cause of diseases classified elsewhere: Secondary | ICD-10-CM

## 2016-12-26 LAB — GC/CHLAMYDIA PROBE AMP (~~LOC~~) NOT AT ARMC
Chlamydia: NEGATIVE
Neisseria Gonorrhea: NEGATIVE

## 2016-12-26 LAB — POCT URINE PREGNANCY: Preg Test, Ur: NEGATIVE

## 2016-12-26 MED ORDER — IBUPROFEN 800 MG PO TABS: 800.0000 mg | ORAL_TABLET | Freq: Three times a day (TID) | ORAL | 5 refills | Status: DC | PRN

## 2016-12-26 MED ORDER — FLUCONAZOLE 150 MG PO TABS: 150.0000 mg | ORAL_TABLET | Freq: Once | ORAL | 0 refills | Status: AC

## 2016-12-26 MED ORDER — MEDROXYPROGESTERONE ACETATE 150 MG/ML IM SUSP: 150.0000 mg | INTRAMUSCULAR | 3 refills | Status: DC

## 2016-12-26 MED ORDER — CLINDAMYCIN HCL 300 MG PO CAPS: 300.0000 mg | ORAL_CAPSULE | Freq: Three times a day (TID) | ORAL | 0 refills | Status: DC

## 2016-12-26 MED ORDER — MEDROXYPROGESTERONE ACETATE 150 MG/ML IM SUSP
150.0000 mg | Freq: Once | INTRAMUSCULAR | Status: AC
  Administered 2016-12-26: 150 mg via INTRAMUSCULAR

## 2016-12-26 NOTE — Progress Notes (Signed)
Patient has been having abnormal bleeding. She was treated for BV recently. Patient has been having cramping with clots.

## 2016-12-27 NOTE — Progress Notes (Signed)
Patient ID: Leah Barry, female   DOB: 03/13/1986, 31 y.o.   MRN: [id number]  Chief Complaint  Patient presents with  . Gynecologic Exam    HPI Leah Barry is a 31 y.o. female.  Heavy periods and cramping. HPI  Past Medical History:  Diagnosis Date  . Anxiety   . Apnea, sleep    no CPAP use, states lost machine during move to [place]  . Bipolar 1 disorder (HCC)   . Bipolar disorder (HCC)    no current med.  . Depression   . History of MRSA infection 2010  . Irritable bowel syndrome (IBS)    no current med.  . Pelvic inflammatory disease   . Scalp laceration    staples to be removed 03/13/2014  . Ulnar fracture 03/09/2014   left    Past Surgical History:  Procedure Laterality Date  . CYST EXCISION     middle of back  . ORIF ULNAR FRACTURE Left 03/16/2014   Procedure: OPEN REDUCTION INTERNAL FIXATION (ORIF) LEFT ULNAR FRACTURE;  Surgeon: Marlowe Shores, MD;  Location: [facility];  Service: Orthopedics;  Laterality: Left;  left  . OVARIAN CYST REMOVAL    . THYROID CYST EXCISION     nodule exc.  . TUBAL LIGATION      Family History  Problem Relation Age of Onset  . Heart disease Mother   . Cancer Maternal Grandmother     breast  . Cancer Paternal Grandmother     breast    Social History Social History  Substance Use Topics  . Smoking status: Current Some Day Smoker    Years: 3.00    Types: Cigars  . Smokeless tobacco: Never Used     Comment: 1-2 cig./day  . Alcohol use 0.0 oz/week    Allergies  Allergen Reactions  . Contrast Media [Iodinated Diagnostic Agents] Itching    Pt developed itching after contrast administration; patient had also received Fentanyl immediately prior to transport to [place]. The itching was without any rash, it was concentrated in the face/nose, and relieved with Benadryl. Possible that itching a side effect of pain medication rather than contrast.  . Sulfa Antibiotics Swelling    SWELLING OF EYES  . Toradol  [Ketorolac Tromethamine] Itching  . Bee Venom Swelling    "Bees make my eyes swell."  . Flagyl [Metronidazole] Nausea And Vomiting  . Naproxen Other (See Comments)    NOSE BLEED  . Bactrim [Sulfamethoxazole-Trimethoprim]     Eyes swell  . Tramadol     Nausea     Current Outpatient Prescriptions  Medication Sig Dispense Refill  . ALPRAZolam (XANAX) 0.5 MG tablet Take 0.5 mg by mouth at bedtime as needed for anxiety.    [name] ibuprofen (ADVIL,MOTRIN) 600 MG tablet Take 1 tablet (600 mg total) by mouth every 6 (six) hours as needed. 30 tablet 0  . clindamycin (CLEOCIN) 300 MG capsule Take 1 capsule (300 mg total) by mouth 3 (three) times daily. 30 capsule 0  . diclofenac (VOLTAREN) 75 MG EC tablet Take 1 tablet (75 mg total) by mouth 2 (two) times daily. (Patient not taking: Reported on 12/25/2016) 20 tablet 0  . ibuprofen (ADVIL,MOTRIN) 800 MG tablet Take 1 tablet (800 mg total) by mouth every 8 (eight) hours as needed. 30 tablet 5  . medroxyPROGESTERone (DEPO-PROVERA) 150 MG/ML injection Inject 1 mL (150 mg total) into the muscle every 3 (three) months. 1 mL 3  . ondansetron (ZOFRAN ODT) 4 MG disintegrating  tablet Take 1 tablet (4 mg total) by mouth every 8 (eight) hours as needed for nausea or vomiting. (Patient not taking: Reported on 12/25/2016) 20 tablet 0   No current facility-administered medications for this visit.     Review of Systems Review of Systems Constitutional: negative for fatigue and weight loss Respiratory: negative for cough and wheezing Cardiovascular: negative for chest pain, fatigue and palpitations Gastrointestinal: negative for abdominal pain and change in bowel habits Genitourinary:positive for abnormal periods Integument/breast: negative for nipple discharge Musculoskeletal:negative for myalgias Neurological: negative for gait problems and tremors Behavioral/Psych: positive for depression Endocrine: negative for temperature intolerance      Blood pressure  126/82, pulse 91, weight 186 lb 3.2 oz (84.5 kg), last menstrual period 12/24/2016.  Physical Exam Physical Exam General:   alert  Skin:   no rash or abnormalities  Lungs:   clear to auscultation bilaterally  Heart:   regular rate and rhythm, S1, S2 normal, no murmur, click, rub or gallop  Breasts:   normal without suspicious masses, skin or nipple changes or axillary nodes  Abdomen:  normal findings: no organomegaly, soft, non-tender and no hernia  Pelvis:  External genitalia: normal general appearance Urinary system: urethral meatus normal and bladder without fullness, nontender Vaginal: normal without tenderness, induration or masses Cervix: normal appearance Adnexa: bilateral adnexal tenderness.  No masses. Uterus: anteverted and tender, normal size    50% of 15 min visit spent on counseling and coordination of care.    Data Reviewed Wet prep Cultures CBC  Assessment     BV.  Possible ascending PID. AUB Dysmenorrhea Depression / Anxiety, reactive    Plan     Clindamycin Rx  Ibuprofen Rx  Depo Provera Rx  F/U prn  Orders Placed This Encounter  Procedures  . POCT urine pregnancy   Meds ordered this encounter  Medications  . ALPRAZolam (XANAX) 0.5 MG tablet    Sig: Take 0.5 mg by mouth at bedtime as needed for anxiety.  . medroxyPROGESTERone (DEPO-PROVERA) 150 MG/ML injection    Sig: Inject 1 mL (150 mg total) into the muscle every 3 (three) months.    Dispense:  1 mL    Refill:  3  . clindamycin (CLEOCIN) 300 MG capsule    Sig: Take 1 capsule (300 mg total) by mouth 3 (three) times daily.    Dispense:  30 capsule    Refill:  0  . ibuprofen (ADVIL,MOTRIN) 800 MG tablet    Sig: Take 1 tablet (800 mg total) by mouth every 8 (eight) hours as needed.    Dispense:  30 tablet    Refill:  5  . medroxyPROGESTERone (DEPO-PROVERA) injection 150 mg  . fluconazole (DIFLUCAN) 150 MG tablet    Sig: Take 1 tablet (150 mg total) by mouth once.    Dispense:  1 tablet     Refill:  0          Patient ID: Leah Barry, female   DOB: 09/08/1986, 31 y.o.   MRN: [id number]

## 2016-12-29 LAB — CYTOLOGY - PAP
Diagnosis: NEGATIVE
HPV: NOT DETECTED

## 2017-01-18 ENCOUNTER — Telehealth: Payer: Self-pay

## 2017-01-18 NOTE — Telephone Encounter (Signed)
Returned call, no answer, no option to leave vm 

## 2017-01-26 ENCOUNTER — Encounter (HOSPITAL_COMMUNITY): Payer: Self-pay

## 2017-01-26 DIAGNOSIS — R1110 Vomiting, unspecified: Secondary | ICD-10-CM | POA: Diagnosis not present

## 2017-01-26 DIAGNOSIS — R1030 Lower abdominal pain, unspecified: Secondary | ICD-10-CM | POA: Diagnosis not present

## 2017-01-26 DIAGNOSIS — F17290 Nicotine dependence, other tobacco product, uncomplicated: Secondary | ICD-10-CM | POA: Diagnosis not present

## 2017-01-26 DIAGNOSIS — Z5321 Procedure and treatment not carried out due to patient leaving prior to being seen by health care provider: Secondary | ICD-10-CM | POA: Diagnosis not present

## 2017-01-26 DIAGNOSIS — R109 Unspecified abdominal pain: Secondary | ICD-10-CM | POA: Diagnosis present

## 2017-01-26 LAB — COMPREHENSIVE METABOLIC PANEL
ALT: 34 U/L (ref 14–54)
AST: 24 U/L (ref 15–41)
Albumin: 3.8 g/dL (ref 3.5–5.0)
Alkaline Phosphatase: 63 U/L (ref 38–126)
Anion gap: 8 (ref 5–15)
BUN: 14 mg/dL (ref 6–20)
CO2: 24 mmol/L (ref 22–32)
Calcium: 8.9 mg/dL (ref 8.9–10.3)
Chloride: 112 mmol/L — ABNORMAL HIGH (ref 101–111)
Creatinine, Ser: 0.62 mg/dL (ref 0.44–1.00)
GFR calc Af Amer: >60 mL/min (ref >60)
GFR calc non Af Amer: >60 mL/min (ref >60)
Glucose, Bld: 85 mg/dL (ref 65–99)
Potassium: 3.4 mmol/L — ABNORMAL LOW (ref 3.5–5.1)
Sodium: 144 mmol/L (ref 135–145)
Total Bilirubin: 0.4 mg/dL (ref 0.3–1.2)
Total Protein: 6.8 g/dL (ref 6.5–8.1)

## 2017-01-26 LAB — CBC
HCT: 39.3 % (ref 36.0–46.0)
Hemoglobin: 14.2 g/dL (ref 12.0–15.0)
MCH: 27.5 pg (ref 26.0–34.0)
MCHC: 36.1 g/dL — ABNORMAL HIGH (ref 30.0–36.0)
MCV: 76.2 fL — ABNORMAL LOW (ref 78.0–100.0)
Platelets: 251 10*3/uL (ref 150–400)
RBC: 5.16 MIL/uL — ABNORMAL HIGH (ref 3.87–5.11)
RDW: 13.1 % (ref 11.5–15.5)
WBC: 12.3 10*3/uL — ABNORMAL HIGH (ref 4.0–10.5)

## 2017-01-26 LAB — ETHANOL: Alcohol, Ethyl (B): 40 mg/dL — ABNORMAL HIGH (ref <5)

## 2017-01-26 NOTE — ED Triage Notes (Addendum)
Pt states that she drank 2 bottles of liquor yesterday, and ever since, she has been experiencing lower abdominal pain. She states that she tried to drink some more today to ease her pain , but it made it worse. She also states that she has percocet at home for pain, but doesn't want to take it with the alcohol. She is drinking Sprite in triage without vomiting or difficulty. No shaking, sweating, or altered LOC noted.  A&Ox4.

## 2017-01-27 ENCOUNTER — Emergency Department (HOSPITAL_COMMUNITY)
Admission: EM | Admit: 2017-01-27 | Discharge: 2017-01-27 | Disposition: A | Discharge status: Home / Self Care | Payer: Medicaid Other | Attending: Dermatology | Admitting: Dermatology

## 2017-01-27 NOTE — ED Notes (Signed)
No answer when called 

## 2017-01-27 NOTE — ED Notes (Signed)
NO answer when called 

## 2017-01-31 ENCOUNTER — Emergency Department (HOSPITAL_COMMUNITY): Payer: Medicaid Other

## 2017-01-31 ENCOUNTER — Encounter (HOSPITAL_COMMUNITY): Payer: Self-pay | Admitting: Emergency Medicine

## 2017-01-31 ENCOUNTER — Emergency Department (HOSPITAL_COMMUNITY)
Admission: EM | Admit: 2017-01-31 | Discharge: 2017-01-31 | Disposition: A | Discharge status: Home / Self Care | Payer: Medicaid Other | Attending: Emergency Medicine | Admitting: Emergency Medicine

## 2017-01-31 DIAGNOSIS — R110 Nausea: Secondary | ICD-10-CM | POA: Diagnosis not present

## 2017-01-31 DIAGNOSIS — R1084 Generalized abdominal pain: Secondary | ICD-10-CM | POA: Diagnosis not present

## 2017-01-31 DIAGNOSIS — F17210 Nicotine dependence, cigarettes, uncomplicated: Secondary | ICD-10-CM | POA: Insufficient documentation

## 2017-01-31 DIAGNOSIS — R109 Unspecified abdominal pain: Secondary | ICD-10-CM

## 2017-01-31 LAB — COMPREHENSIVE METABOLIC PANEL
ALT: 25 U/L (ref 14–54)
AST: 17 U/L (ref 15–41)
Albumin: 3.3 g/dL — ABNORMAL LOW (ref 3.5–5.0)
Alkaline Phosphatase: 49 U/L (ref 38–126)
Anion gap: 9 (ref 5–15)
BUN: 13 mg/dL (ref 6–20)
CO2: 22 mmol/L (ref 22–32)
Calcium: 8.7 mg/dL — ABNORMAL LOW (ref 8.9–10.3)
Chloride: 108 mmol/L (ref 101–111)
Creatinine, Ser: 0.9 mg/dL (ref 0.44–1.00)
GFR calc Af Amer: >60 mL/min (ref >60)
GFR calc non Af Amer: >60 mL/min (ref >60)
Glucose, Bld: 99 mg/dL (ref 65–99)
Potassium: 4 mmol/L (ref 3.5–5.1)
Sodium: 139 mmol/L (ref 135–145)
Total Bilirubin: 0.8 mg/dL (ref 0.3–1.2)
Total Protein: 5.8 g/dL — ABNORMAL LOW (ref 6.5–8.1)

## 2017-01-31 LAB — CBC
HCT: 36.9 % (ref 36.0–46.0)
Hemoglobin: 12.8 g/dL (ref 12.0–15.0)
MCH: 27.2 pg (ref 26.0–34.0)
MCHC: 34.7 g/dL (ref 30.0–36.0)
MCV: 78.3 fL (ref 78.0–100.0)
Platelets: 205 10*3/uL (ref 150–400)
RBC: 4.71 MIL/uL (ref 3.87–5.11)
RDW: 12.8 % (ref 11.5–15.5)
WBC: 7.8 10*3/uL (ref 4.0–10.5)

## 2017-01-31 LAB — RAPID HIV SCREEN (HIV 1/2 AB+AG)
HIV 1/2 Antibodies: NONREACTIVE
HIV-1 P24 Antigen - HIV24: NONREACTIVE

## 2017-01-31 LAB — URINALYSIS, ROUTINE W REFLEX MICROSCOPIC
Bilirubin Urine: NEGATIVE
Glucose, UA: NEGATIVE mg/dL
Hgb urine dipstick: NEGATIVE
Ketones, ur: NEGATIVE mg/dL
Leukocytes, UA: NEGATIVE
Nitrite: NEGATIVE
Protein, ur: NEGATIVE mg/dL
Specific Gravity, Urine: 1.018 (ref 1.005–1.030)
pH: 5 (ref 5.0–8.0)

## 2017-01-31 LAB — WET PREP, GENITAL
Sperm: NONE SEEN
Trich, Wet Prep: NONE SEEN
Yeast Wet Prep HPF POC: NONE SEEN

## 2017-01-31 LAB — LIPASE, BLOOD: Lipase: 20 U/L (ref 11–51)

## 2017-01-31 LAB — PREGNANCY, URINE: Preg Test, Ur: NEGATIVE

## 2017-01-31 MED ORDER — MORPHINE SULFATE (PF) 4 MG/ML IV SOLN
4.0000 mg | Freq: Once | INTRAVENOUS | Status: AC
  Administered 2017-01-31: 4 mg via INTRAVENOUS
  Filled 2017-01-31: qty 1

## 2017-01-31 MED ORDER — LIDOCAINE HCL (PF) 1 % IJ SOLN
2.0000 mL | Freq: Once | INTRAMUSCULAR | Status: AC
  Administered 2017-01-31: 2 mL
  Filled 2017-01-31: qty 5

## 2017-01-31 MED ORDER — BARIUM SULFATE 2.1 % PO SUSP
ORAL | Status: AC
  Filled 2017-01-31: qty 2

## 2017-01-31 MED ORDER — DOXYCYCLINE HYCLATE 100 MG PO CAPS: 100.0000 mg | ORAL_CAPSULE | Freq: Two times a day (BID) | ORAL | 0 refills | Status: AC

## 2017-01-31 MED ORDER — SODIUM CHLORIDE 0.9 % IV BOLUS (SEPSIS)
1000.0000 mL | Freq: Once | INTRAVENOUS | Status: AC
  Administered 2017-01-31: 1000 mL via INTRAVENOUS

## 2017-01-31 MED ORDER — CEFTRIAXONE SODIUM 250 MG IJ SOLR
250.0000 mg | Freq: Once | INTRAMUSCULAR | Status: AC
  Administered 2017-01-31: 250 mg via INTRAMUSCULAR
  Filled 2017-01-31: qty 250

## 2017-01-31 MED ORDER — METRONIDAZOLE 0.75 % VA GEL: 1.0000 | Freq: Two times a day (BID) | VAGINAL | 0 refills | Status: DC

## 2017-01-31 MED ORDER — ONDANSETRON 4 MG PO TBDP: 4.0000 mg | ORAL_TABLET | Freq: Three times a day (TID) | ORAL | 0 refills | Status: DC | PRN

## 2017-01-31 NOTE — Discharge Instructions (Signed)
You have bacterial vaginosis, which will me treated with flagyl gel as you requested.  As we discussed the lab work and CT scan we did today were normal and reassuring.  It is still unclear as to what could be causing your pain.   Please avoid alcohol, tobacco, and ibuprofen as these substances can worsen stomach issues.  You may take tylenol for pain as needed.  You have been prescribed zofran for nausea.    Please [facility] to establish care and for long term management of your abdominal pain and nausea.  This clinic may refer you to a gastroenterologist for further work up.   You were treated for PID today with ceftriaxone shot.  Please take doxycycline as prescribed.  Your gonorrhea, chlamydia and syphilis testing has not resulted.  You will be notified if these results are abnormal.

## 2017-01-31 NOTE — ED Notes (Signed)
Pelvic cart set up outside of room. 

## 2017-01-31 NOTE — ED Notes (Signed)
Patient transported to CT 

## 2017-01-31 NOTE — ED Provider Notes (Signed)
Plains of diffuse abdominal pain onset 2 weeks ago. Denies dyspareunia. It is at epigastrium and also lower abdomen. On exam she is alert and nontoxic abdomen obese normal active bowel sounds tender at infraumbilical area no guarding rigidity or rebound   Doug SouSam Brentin Shin, MD 01/31/17 1729

## 2017-01-31 NOTE — ED Provider Notes (Signed)
[facility] DEPT Provider Note   CSN: [id number] Arrival date & time: 01/31/17  0849     History   Chief Complaint Chief Complaint  Patient presents with  . Abdominal Pain    HPI Leah Barry is a 31 y.o. female with pertinent pmh of IBS, PID, bilateral tubal ligation, ectopic pregnancy, ovarian cysts x 2, tobacco abuse presents to ED with generalized abdominal pain (worse on epigastric and suprapubic) associated with bilateral low back pain, abdominal swelling, nausea x couple of weeks.  Patient also notes recent 8lb weight increase.  Patient also notes urinary frequency, foul smelling urine and increased, increased clear vaginal discharge.   Aggravating symptoms include palpation and intake of food/fluids, patient states even water makes her abdominal pain worse and palpation.  Patient notes "having to digest makes the pain worse".  Patient is sexually active with men only, one recent new partner without barrier method of birth control.  Patient interested in STD testing.    Of note, patient tells me she donates plasma twice a week for many years. She was told her iron was dropping two days ago and advised to start taking iron pills.    No recent fevers, chest pain, SOB, diarrhea, constipation, black/bloody stools.  No vaginal bleeding or spotting.  Last known menstrual period January 2018.  Patient drinks ETOH occasionally, states she tried to drink ETOH and take percocet to alleviate abdominal pain, no relief. No heavy use of ETOH or NSAIDs. No known h/o PUD.   HPI  Past Medical History:  Diagnosis Date  . Anxiety   . Apnea, sleep    no CPAP use, states lost machine during move to [place]  . Bipolar 1 disorder (HCC)   . Bipolar disorder (HCC)    no current med.  . Depression   . History of MRSA infection 2010  . Irritable bowel syndrome (IBS)    no current med.  . Pelvic inflammatory disease   . Scalp laceration    staples to be removed 03/13/2014  . Ulnar fracture  03/09/2014   left    Patient Active Problem List   Diagnosis Date Noted  . Dysmenorrhea 05/28/2014  . GC (gonococcus) 05/28/2014  . Chlamydia contact 05/28/2014  . Pain 04/14/2014  . UTI symptoms 04/14/2014  . Routine general medical examination at a health care facility 09/26/2013  . Candidiasis of vulva and vagina 09/26/2013  . Unspecified symptom associated with female genital organs 09/10/2013  . Abnormal uterine bleeding (AUB) 09/10/2013  . PID (pelvic inflammatory disease) 07/29/2013  . Dysuria 07/29/2013  . Screening examination for venereal disease 07/29/2013  . Chronic PID (chronic pelvic inflammatory disease) 06/30/2013  . Excessive or frequent menstruation 06/30/2013  . Genital herpes, unspecified 06/30/2013  . Unspecified inflammatory disease of female pelvic organs and tissues 03/12/2013    Past Surgical History:  Procedure Laterality Date  . CYST EXCISION     middle of back  . ORIF ULNAR FRACTURE Left 03/16/2014   Procedure: OPEN REDUCTION INTERNAL FIXATION (ORIF) LEFT ULNAR FRACTURE;  Surgeon: Marlowe ShoresMatthew A Weingold, MD;  Location: [facility];  Service: Orthopedics;  Laterality: Left;  left  . OVARIAN CYST REMOVAL    . THYROID CYST EXCISION     nodule exc.  . TUBAL LIGATION      OB History    Gravida Para Term Preterm AB Living   5 4 4     4    SAB TAB Ectopic Multiple Live Births  4       Home Medications    Prior to Admission medications   Medication Sig Start Date End Date Taking? Authorizing Provider  ALPRAZolam Prudy Feeler) 0.5 MG tablet Take 0.5 mg by mouth at bedtime as needed for anxiety.   Yes Historical Provider, MD  ibuprofen (ADVIL,MOTRIN) 800 MG tablet Take 1 tablet (800 mg total) by mouth every 8 (eight) hours as needed. 12/26/16  Yes Brock Bad, MD  medroxyPROGESTERone (DEPO-PROVERA) 150 MG/ML injection Inject 1 mL (150 mg total) into the muscle every 3 (three) months. 12/26/16  Yes Brock Bad, MD  Multiple  Vitamins-Iron (MULTIVITAMINS WITH IRON) TABS tablet Take 1 tablet by mouth daily.   Yes Historical Provider, MD  oxyCODONE-acetaminophen (PERCOCET/ROXICET) 5-325 MG tablet Take 1 tablet by mouth every 4 (four) hours as needed for severe pain.   Yes Historical Provider, MD  doxycycline (VIBRAMYCIN) 100 MG capsule Take 1 capsule (100 mg total) by mouth 2 (two) times daily. 01/31/17 02/14/17  Liberty Handy, PA-C  metroNIDAZOLE (METROGEL) 0.75 % vaginal gel Place 1 Applicatorful vaginally 2 (two) times daily. 01/31/17   Liberty Handy, PA-C  ondansetron (ZOFRAN ODT) 4 MG disintegrating tablet Take 1 tablet (4 mg total) by mouth every 8 (eight) hours as needed for nausea or vomiting. 01/31/17   Liberty Handy, PA-C    Family History Family History  Problem Relation Age of Onset  . Heart disease Mother   . Cancer Maternal Grandmother     breast  . Cancer Paternal Grandmother     breast    Social History Social History  Substance Use Topics  . Smoking status: Current Some Day Smoker    Years: 3.00    Types: Cigars  . Smokeless tobacco: Never Used     Comment: 1-2 cig./day  . Alcohol use 0.0 oz/week     Comment: drinks heavily occassionally     Allergies   Contrast media [iodinated diagnostic agents]; Latuda [lurasidone hcl]; Sulfa antibiotics; Bee venom; Flagyl [metronidazole]; Naproxen; Tramadol; and Toradol [ketorolac tromethamine]   Review of Systems Review of Systems  Constitutional: Negative for chills and fever.  Respiratory: Negative for cough, chest tightness and shortness of breath.   Cardiovascular: Negative for chest pain and palpitations.  Gastrointestinal: Negative for abdominal pain, constipation, diarrhea, nausea and vomiting.  Genitourinary: Positive for frequency, pelvic pain and vaginal discharge. Negative for dyspareunia, dysuria, flank pain, hematuria, urgency, vaginal bleeding and vaginal pain.  Skin: Negative for color change.  Neurological: Negative for  light-headedness and headaches.     Physical Exam Updated Vital Signs BP 117/79   Pulse 68   Temp 98 F (36.7 C) (Oral)   Resp 18   Ht 5\' 7"  (1.702 m)   Wt 90.3 kg   LMP 11/27/2016   SpO2 98%   BMI 31.17 kg/m   Physical Exam  Constitutional: She is oriented to person, place, and time. She appears well-developed and well-nourished.  HENT:  Head: Normocephalic and atraumatic.  Nose: Nose normal.  Mouth/Throat: Oropharynx is clear and moist. No oropharyngeal exudate.  Eyes: Conjunctivae and EOM are normal. Pupils are equal, round, and reactive to light.  Neck: Normal range of motion. Neck supple. No JVD present.  Cardiovascular: Normal rate, regular rhythm, normal heart sounds and intact distal pulses.   No murmur heard. Pulmonary/Chest: Effort normal and breath sounds normal. No respiratory distress. She has no wheezes. She has no rales. She exhibits no tenderness.  Abdominal: Soft. Bowel sounds are normal.  She exhibits no distension (questionable) and no mass. There is generalized tenderness and tenderness in the epigastric area, periumbilical area and suprapubic area. There is no rebound and no guarding.    Abdomen is soft but tender diffusely most significant at epigastric and periumbilical regions without obvious. distention, rigidity, guarding or rebound.   No surgical abdominal scars noted.  No pulsating masses.  + Bowel sounds throughout.  No CVAT.  Negative Murphy's. Negative McBurney's. Negative Psoas sign.  Non palpable kidneys. No hepatosplenomegaly.   Genitourinary: Rectum normal. Pelvic exam was performed with patient prone. Cervix exhibits motion tenderness and discharge. There is tenderness in the vagina. Vaginal discharge found.  Genitourinary Comments:  Moderately thick, white discharge in vaginal vault and cervix +CMT, cervix is close without bleeding or friability +Suprapubic tenderness  External genitalia normal without erythema, edema, tenderness,  discharge or lesions.  No groin lymphadenopathy.  Vaginal mucosa normal, pink without lesions.  Uterus is non palpable, not enlarged or tender.  Non palpable adnexa without tenderness or fullness.  Musculoskeletal: Normal range of motion. She exhibits no deformity.  Lymphadenopathy:    She has no cervical adenopathy.  Neurological: She is alert and oriented to person, place, and time.  Skin: Skin is warm and dry. Capillary refill takes less than 2 seconds.  Psychiatric: She has a normal mood and affect. Her behavior is normal. Judgment and thought content normal.  Nursing note and vitals reviewed.    ED Treatments / Results  Labs (all labs ordered are listed, but only abnormal results are displayed) Labs Reviewed  WET PREP, GENITAL - Abnormal; Notable for the following:       Result Value   Clue Cells Wet Prep HPF POC PRESENT (*)    WBC, Wet Prep HPF POC MANY (*)    All other components within normal limits  COMPREHENSIVE METABOLIC PANEL - Abnormal; Notable for the following:    Calcium 8.7 (*)    Total Protein 5.8 (*)    Albumin 3.3 (*)    All other components within normal limits  URINALYSIS, ROUTINE W REFLEX MICROSCOPIC - Abnormal; Notable for the following:    APPearance CLOUDY (*)    All other components within normal limits  LIPASE, BLOOD  CBC  PREGNANCY, URINE  RAPID HIV SCREEN (HIV 1/2 AB+AG)  RPR  GC/CHLAMYDIA PROBE AMP ([place]) NOT AT [facility]    EKG  EKG Interpretation None       Radiology Ct Abdomen Pelvis Wo Contrast  Result Date: 01/31/2017 CLINICAL DATA:  Lower abdominal pain since streaky alcohol yesterday. EXAM: CT ABDOMEN AND PELVIS WITHOUT CONTRAST TECHNIQUE: Multidetector CT imaging of the abdomen and pelvis was performed following the standard protocol without IV contrast. COMPARISON:  Ultrasound 12/25/2016 FINDINGS: Lower chest: The lung bases are clear of acute process. No pleural effusion or pulmonary lesions. The heart is normal in size. No  pericardial effusion. The distal esophagus and aorta are unremarkable. Hepatobiliary: No focal hepatic lesions or intrahepatic biliary dilatation. The gallbladder is normal. No common bile duct dilatation. Pancreas: Grossly normal without contrast. Spleen: Normal size.  No focal lesions. Adrenals/Urinary Tract: The adrenal glands appear normal. No renal calculi or hydronephrosis. No obstructing ureteral calculi bladder calculi. Stomach/Bowel: The stomach, duodenum, small bowel and colon are grossly normal. No inflammatory changes, mass lesions or obstructive findings. The terminal ileum and appendix are normal. Vascular/Lymphatic: No significant vascular findings are present. No enlarged abdominal or pelvic lymph nodes. Reproductive: Uterus and bilateral adnexa are unremarkable. Other: No  pelvic mass or adenopathy. No free pelvic fluid collections. No inguinal mass or adenopathy. No abdominal wall hernia or subcutaneous lesions. Musculoskeletal: No significant bony findings. IMPRESSION: Unremarkable CT abdomen/ pelvis. No acute abdominal/ pelvic findings, mass lesions or adenopathy. Electronically Signed   By: Rudie Meyer M.D.   On: 01/31/2017 17:01    Procedures Procedures (including critical care time)  Medications Ordered in ED Medications  sodium chloride 0.9 % bolus 1,000 mL (0 mLs Intravenous Stopped 01/31/17 1227)  morphine 4 MG/ML injection 4 mg (4 mg Intravenous Given 01/31/17 1115)  morphine 4 MG/ML injection 4 mg (4 mg Intravenous Given 01/31/17 1401)  cefTRIAXone (ROCEPHIN) injection 250 mg (250 mg Intramuscular Given 01/31/17 1738)  lidocaine (PF) (XYLOCAINE) 1 % injection 2 mL (2 mLs Other Given 01/31/17 1738)     Initial Impression / Assessment and Plan / ED Course  I have reviewed the triage vital signs and the nursing notes.  Pertinent labs & imaging results that were available during my care of the patient were reviewed by me and considered in my medical decision making (see chart  for details).  Clinical Course as of Feb 01 1821  Wed Jan 31, 2017  1151 Temp: 98 F (36.7 C) [CG]  1151 Pulse Rate: 82 [CG]  1151 BP: 124/79 [CG]  1151 Resp: 16 [CG]  1151 SpO2: 100 % [CG]  1151 Hemoglobin: 12.8 [CG]  1151 WBC: 7.8 [CG]  1151 Lipase: 20 [CG]  1151 AST: 17 [CG]  1151 ALT: 25 [CG]  1151 Alkaline Phosphatase: 49 [CG]  1151 Specific Gravity, Urine: 1.018 [CG]  1151 Hgb urine dipstick: NEGATIVE [CG]  1151 Nitrite: NEGATIVE [CG]  1151 Leukocytes, UA: NEGATIVE [CG]  1151 Preg Test, Ur: NEGATIVE [CG]  1312 Clue Cells Wet Prep HPF POC: (!) PRESENT [CG]  1312 WBC, Wet Prep HPF POC: (!) MANY [CG]  1345 Discussed patient with Dr. Ethelda Chick who recommended CT scan with oral contrast given risky sexual behavior, CMT, nausea, vomiting   [CG]  1722 IMPRESSION: Unremarkable CT abdomen/ pelvis. No acute abdominal/ pelvic findings, mass lesions or adenopathy. CT ABDOMEN PELVIS WO CONTRAST [CG]    Clinical Course User Index [CG] Liberty Handy, PA-C   Vital signs within normal limits and stable while in ED. Abdomen is diffusely tender, most significant at epigastric and suprapubic regions.  +CMT and thin white discharge adherent to cervix.   +Clue cells, otherwise lab work reassuring. Hcg negative. CT scan obtained given prolonged history of abdominal pain (patient seen for similar pain 1 month ago, transvaginal/Pelvis ultrasounds on 12/25/16 normal).  CT scan negative. Discussed findings with patient, recommended discharge with GI follow up.  Abdominal pain and nausea well controlled in ED.  Patient became hungry by time of discharge and ate without complications.  I offered patient GI cocktail prior to PO challenge to avoid exacerbation of her pain/nausea, but pt declined she was very hungry and "I just want to eat". Repeat abdominal exam prior to discharge benign.  Patient considered safe for discharge with GI f/u. Will treat for PID given,risky sexual behavior, new sexual  partner, +CMT and thin white discharge adherent to cervix. H/o PID. Pending GC/C testing.   Final Clinical Impressions(s) / ED Diagnoses   Final diagnoses:  Generalized abdominal pain  Nausea    New Prescriptions Discharge Medication List as of 01/31/2017  5:38 PM    START taking these medications   Details  doxycycline (VIBRAMYCIN) 100 MG capsule Take 1 capsule (100 mg total) by  mouth 2 (two) times daily., Starting Wed 01/31/2017, Until Wed 02/14/2017, Print    ondansetron (ZOFRAN ODT) 4 MG disintegrating tablet Take 1 tablet (4 mg total) by mouth every 8 (eight) hours as needed for nausea or vomiting., Starting Wed 01/31/2017, Print         Liberty Handy, PA-C 02/01/17 Sheldon Silvan, MD 02/01/17 2244

## 2017-01-31 NOTE — ED Triage Notes (Signed)
Pt states she has been having abd pain for 1 week. Pt feels like her abd is swelling. Pt has had n/v.

## 2017-02-01 LAB — GC/CHLAMYDIA PROBE AMP (~~LOC~~) NOT AT ARMC
Chlamydia: NEGATIVE
Neisseria Gonorrhea: NEGATIVE

## 2017-02-01 LAB — RPR: RPR Ser Ql: NONREACTIVE

## 2017-02-23 ENCOUNTER — Ambulatory Visit: Payer: Medicaid Other

## 2017-03-13 ENCOUNTER — Emergency Department (HOSPITAL_COMMUNITY): Payer: Medicaid Other

## 2017-03-13 ENCOUNTER — Emergency Department (HOSPITAL_COMMUNITY)
Admission: EM | Admit: 2017-03-13 | Discharge: 2017-03-13 | Disposition: A | Discharge status: Home / Self Care | Payer: Medicaid Other | Attending: Emergency Medicine | Admitting: Emergency Medicine

## 2017-03-13 ENCOUNTER — Encounter (HOSPITAL_COMMUNITY): Payer: Self-pay | Admitting: Emergency Medicine

## 2017-03-13 DIAGNOSIS — M545 Low back pain, unspecified: Secondary | ICD-10-CM

## 2017-03-13 DIAGNOSIS — R102 Pelvic and perineal pain: Secondary | ICD-10-CM

## 2017-03-13 DIAGNOSIS — F17290 Nicotine dependence, other tobacco product, uncomplicated: Secondary | ICD-10-CM | POA: Insufficient documentation

## 2017-03-13 DIAGNOSIS — N939 Abnormal uterine and vaginal bleeding, unspecified: Secondary | ICD-10-CM | POA: Diagnosis not present

## 2017-03-13 LAB — URINALYSIS, ROUTINE W REFLEX MICROSCOPIC
Bilirubin Urine: NEGATIVE
Glucose, UA: NEGATIVE mg/dL
Hgb urine dipstick: NEGATIVE
Ketones, ur: NEGATIVE mg/dL
Leukocytes, UA: NEGATIVE
Nitrite: NEGATIVE
Protein, ur: NEGATIVE mg/dL
Specific Gravity, Urine: 1.024 (ref 1.005–1.030)
pH: 7 (ref 5.0–8.0)

## 2017-03-13 LAB — WET PREP, GENITAL
Sperm: NONE SEEN
Trich, Wet Prep: NONE SEEN
Yeast Wet Prep HPF POC: NONE SEEN

## 2017-03-13 LAB — RPR: RPR Ser Ql: NONREACTIVE

## 2017-03-13 LAB — I-STAT BETA HCG BLOOD, ED (MC, WL, AP ONLY): I-stat hCG, quantitative: <5 m[IU]/mL (ref <5)

## 2017-03-13 LAB — HIV ANTIBODY (ROUTINE TESTING W REFLEX): HIV Screen 4th Generation wRfx: NONREACTIVE

## 2017-03-13 MED ORDER — IBUPROFEN 800 MG PO TABS
800.0000 mg | ORAL_TABLET | Freq: Once | ORAL | Status: AC
  Administered 2017-03-13: 800 mg via ORAL
  Filled 2017-03-13: qty 1

## 2017-03-13 MED ORDER — ACETAMINOPHEN 500 MG PO TABS
1000.0000 mg | ORAL_TABLET | Freq: Once | ORAL | Status: DC
  Filled 2017-03-13: qty 2

## 2017-03-13 NOTE — ED Triage Notes (Signed)
Pt reports pelvic pain for the past week w/ discharge starting yesterday.

## 2017-03-13 NOTE — ED Notes (Signed)
Pt ambulated to restroom from room, pt had no complaints and ambulated with a steady gait.

## 2017-03-13 NOTE — Discharge Instructions (Signed)
Get help right away if your are bleeding through 1 pad every 15 minutes or you pass out.

## 2017-03-13 NOTE — ED Provider Notes (Addendum)
[facility]PT Provider Note   CSN: [id number] Arrival date & time: 03/13/17  7829     History   Chief Complaint Chief Complaint  Patient presents with  . Pelvic Pain  . Vaginal Discharge    HPI Leah Barry is a 31 y.o. female with a pmh of Pelvic pain status post tubal ligation. She is also curretnly on Depomedrol shot because she had an unplanned pregnancy after her ligation surgery. She developed pink tinged vaginal discharge this week had spottign yesterday and now has "brown crumbly discharge." She feels swelling in her vagina "like something is stuck in there," and also has developed Left lower quadrant pelvic pain and left flank pain. At work today she became nauseous and lightheaded. She denies urinary sxs  Or hx o  Kidney stones. She did not take any medication pta. HPI  Past Medical History:  Diagnosis Date  . Anxiety   . Apnea, sleep    no CPAP use, states lost machine during move to [place]  . Bipolar 1 disorder (HCC)   . Bipolar disorder (HCC)    no current med.  . Depression   . History of MRSA infection 2010  . Irritable bowel syndrome (IBS)    no current med.  . Pelvic inflammatory disease   . Scalp laceration    staples to be removed 03/13/2014  . Ulnar fracture 03/09/2014   left    Patient Active Problem List   Diagnosis Date Noted  . Dysmenorrhea 05/28/2014  . GC (gonococcus) 05/28/2014  . Chlamydia contact 05/28/2014  . Pain 04/14/2014  . UTI symptoms 04/14/2014  . Routine general medical examination at a health care facility 09/26/2013  . Candidiasis of vulva and vagina 09/26/2013  . Unspecified symptom associated with female genital organs 09/10/2013  . Abnormal uterine bleeding (AUB) 09/10/2013  . PID (pelvic inflammatory disease) 07/29/2013  . Dysuria 07/29/2013  . Screening examination for venereal disease 07/29/2013  . Chronic PID (chronic pelvic inflammatory disease) 06/30/2013  . Excessive or frequent menstruation 06/30/2013  .  Genital herpes, unspecified 06/30/2013  . Unspecified inflammatory disease of female pelvic organs and tissues 03/12/2013    Past Surgical History:  Procedure Laterality Date  . CYST EXCISION     middle of back  . ORIF ULNAR FRACTURE Left 03/16/2014   Procedure: OPEN REDUCTION INTERNAL FIXATION (ORIF) LEFT ULNAR FRACTURE;  Surgeon: Marlowe Shores, MD;  Location: [facility];  Service: Orthopedics;  Laterality: Left;  left  . OVARIAN CYST REMOVAL    . THYROID CYST EXCISION     nodule exc.  . TUBAL LIGATION      OB History    Gravida Para Term Preterm AB Living   SAB TAB Ectopic Multiple Live Births           4       Home Medications    Prior to Admission medications   Medication Sig Start Date End Date Taking? Authorizing Provider  ALPRAZolam Prudy Feeler) 0.5 MG tablet Take 0.5 mg by mouth at bedtime as needed for anxiety.    Historical Provider, MD  ibuprofen (ADVIL,MOTRIN) 800 MG tablet Take 1 tablet (800 mg total) by mouth every 8 (eight) hours as needed. 12/26/16   Brock Bad, MD  medroxyPROGESTERone (DEPO-PROVERA) 150 MG/ML injection Inject 1 mL (150 mg total) into the muscle every 3 (three) months. 12/26/16   Brock Bad, MD  metroNIDAZOLE (METROGEL) 0.75 %  vaginal gel Place 1 Applicatorful vaginally 2 (two) times daily. 01/31/17   Liberty Handy, PA-C  Multiple Vitamins-Iron (MULTIVITAMINS WITH IRON) TABS tablet Take 1 tablet by mouth daily.    Historical Provider, MD  ondansetron (ZOFRAN ODT) 4 MG disintegrating tablet Take 1 tablet (4 mg total) by mouth every 8 (eight) hours as needed for nausea or vomiting. 01/31/17   Liberty Handy, PA-C  oxyCODONE-acetaminophen (PERCOCET/ROXICET) 5-325 MG tablet Take 1 tablet by mouth every 4 (four) hours as needed for severe pain.    Historical Provider, MD    Family History Family History  Problem Relation Age of Onset  . Heart disease Mother   . Cancer Maternal Grandmother     breast  .  Cancer Paternal Grandmother     breast    Social History Social History  Substance Use Topics  . Smoking status: Current Some Day Smoker    Years: 3.00    Types: Cigars  . Smokeless tobacco: Never Used     Comment: 1-2 cig./day  . Alcohol use 0.0 oz/week     Comment: drinks heavily occassionally     Allergies   Contrast media [iodinated diagnostic agents]; Latuda [lurasidone hcl]; Sulfa antibiotics; Bee venom; Flagyl [metronidazole]; Naproxen; Tramadol; and Toradol [ketorolac tromethamine]   Review of Systems Review of Systems Ten systems reviewed and are negative for acute change, except as noted in the HPI.    Physical Exam Updated Vital Signs BP 127/84 (BP Location: Left Arm)   Pulse 79   Temp 98.1 F (36.7 C) (Oral)   Resp 16   Ht  (1.702 m)   Wt 83.9 kg   SpO2 96%   BMI 28.98 kg/m   Physical Exam  Constitutional: She is oriented to person, place, and time. She appears well-developed and well-nourished. No distress.  HENT:  Head: Normocephalic and atraumatic.  Eyes: Conjunctivae are normal. No scleral icterus.  Neck: Normal range of motion.  Cardiovascular: Normal rate, regular rhythm and normal heart sounds.  Exam reveals no gallop and no friction rub.   No murmur heard. Pulmonary/Chest: Effort normal and breath sounds normal. No respiratory distress.  Abdominal: Soft. Bowel sounds are normal. She exhibits no distension and no mass. There is no tenderness. There is no guarding.  Genitourinary:  Genitourinary Comments: Pelvic exam: normal external genitalia, vulva, vagina, cervix, uterus and adnexa.   Neurological: She is alert and oriented to person, place, and time.  Skin: Skin is warm and dry. She is not diaphoretic.  Psychiatric: Her behavior is normal.  Nursing note and vitals reviewed.    ED Treatments / Results  Labs (all labs ordered are listed, but only abnormal results are displayed) Labs Reviewed  WET PREP, GENITAL - Abnormal; Notable  for the following:       Result Value   Clue Cells Wet Prep HPF POC PRESENT (*)    WBC, Wet Prep HPF POC MODERATE (*)    All other components within normal limits  RPR  HIV ANTIBODY (ROUTINE TESTING)  URINALYSIS, ROUTINE W REFLEX MICROSCOPIC  I-STAT BETA HCG BLOOD, ED ([facility], [facility], [facility] ONLY)  GC/CHLAMYDIA PROBE AMP ([place]) NOT AT [facility]    EKG  EKG Interpretation None       Radiology No results found.  Procedures Procedures (including critical care time)  Medications Ordered in ED Medications - No data to display   Initial Impression / Assessment and Plan / ED Course  I have reviewed the triage vital signs  and the nursing notes.  Pertinent labs & imaging results that were available during my care of the patient were reviewed by me and considered in my medical decision making (see chart for details).  Clinical Course as of Mar 14 1115  Tue Mar 13, 2017  1059 I have reviewed the patient on the [facility]. She recieves chronic pain control with Dr. Kathe Mariner and last received a 30 day supply of xanax and oxycodone on 02/13/2017.  [AH]    Clinical Course User Index [AH] Arthor Captain, PA-C    Patient work up without sig, cause of her pain.  She will follow up wth her OBGYN. Discussed return precautions.  Final Clinical Impressions(s) / ED Diagnoses   Final diagnoses:  Abnormal uterine bleeding  Pelvic pain  Left-sided low back pain without sciatica, unspecified chronicity    New Prescriptions New Prescriptions   No medications on file     Arthor Captain, PA-C 03/13/17 1118    Linwood Dibbles, MD 03/14/17 1305    Arthor Captain, PA-C 04/02/17 2029    Linwood Dibbles, MD 04/03/17 [id number]

## 2017-03-14 LAB — GC/CHLAMYDIA PROBE AMP (~~LOC~~) NOT AT ARMC
Chlamydia: NEGATIVE
Neisseria Gonorrhea: NEGATIVE

## 2017-03-26 ENCOUNTER — Ambulatory Visit (INDEPENDENT_AMBULATORY_CARE_PROVIDER_SITE_OTHER): Payer: Medicaid Other

## 2017-03-26 VITALS — BP 124/85 | HR 88 | Wt 190.8 lb

## 2017-03-26 DIAGNOSIS — N898 Other specified noninflammatory disorders of vagina: Secondary | ICD-10-CM

## 2017-03-26 DIAGNOSIS — Z3202 Encounter for pregnancy test, result negative: Secondary | ICD-10-CM | POA: Diagnosis not present

## 2017-03-26 DIAGNOSIS — Z3042 Encounter for surveillance of injectable contraceptive: Secondary | ICD-10-CM

## 2017-03-26 LAB — POCT URINE PREGNANCY: Preg Test, Ur: NEGATIVE

## 2017-03-26 MED ORDER — METRONIDAZOLE 0.75 % VA GEL: 1.0000 | Freq: Two times a day (BID) | VAGINAL | 0 refills | Status: DC

## 2017-03-26 MED ORDER — MEDROXYPROGESTERONE ACETATE 150 MG/ML IM SUSP
150.0000 mg | Freq: Once | INTRAMUSCULAR | Status: AC
  Administered 2017-03-26: 150 mg via INTRAMUSCULAR

## 2017-03-26 NOTE — Progress Notes (Signed)
Patient presents for DEPO. UPT-NEGATIVE. Given in RUOQ. Tolerated well. Next DEPO 7/30-8/13/2018  Administrations This Visit    medroxyPROGESTERone (DEPO-PROVERA) injection 150 mg    Admin Date 03/26/2017 Action Given Dose 150 mg Route Intramuscular Administered By Maretta BeesMcGlashan, Kvion Shapley J, RMA

## 2017-04-02 ENCOUNTER — Encounter (HOSPITAL_COMMUNITY): Payer: Self-pay | Admitting: Emergency Medicine

## 2017-04-02 ENCOUNTER — Emergency Department (HOSPITAL_COMMUNITY): Payer: Medicaid Other

## 2017-04-02 ENCOUNTER — Emergency Department (HOSPITAL_COMMUNITY)
Admission: EM | Admit: 2017-04-02 | Discharge: 2017-04-02 | Disposition: A | Discharge status: Home / Self Care | Payer: Medicaid Other | Attending: Emergency Medicine | Admitting: Emergency Medicine

## 2017-04-02 DIAGNOSIS — F17290 Nicotine dependence, other tobacco product, uncomplicated: Secondary | ICD-10-CM | POA: Insufficient documentation

## 2017-04-02 DIAGNOSIS — S060X9A Concussion with loss of consciousness of unspecified duration, initial encounter: Secondary | ICD-10-CM | POA: Diagnosis not present

## 2017-04-02 DIAGNOSIS — Y939 Activity, unspecified: Secondary | ICD-10-CM | POA: Diagnosis not present

## 2017-04-02 DIAGNOSIS — Z79899 Other long term (current) drug therapy: Secondary | ICD-10-CM | POA: Insufficient documentation

## 2017-04-02 DIAGNOSIS — Y999 Unspecified external cause status: Secondary | ICD-10-CM | POA: Diagnosis not present

## 2017-04-02 DIAGNOSIS — Y929 Unspecified place or not applicable: Secondary | ICD-10-CM | POA: Diagnosis not present

## 2017-04-02 DIAGNOSIS — S40022A Contusion of left upper arm, initial encounter: Secondary | ICD-10-CM | POA: Diagnosis not present

## 2017-04-02 DIAGNOSIS — S40021A Contusion of right upper arm, initial encounter: Secondary | ICD-10-CM | POA: Insufficient documentation

## 2017-04-02 DIAGNOSIS — S0990XA Unspecified injury of head, initial encounter: Secondary | ICD-10-CM | POA: Diagnosis present

## 2017-04-02 MED ORDER — ACETAMINOPHEN 500 MG PO TABS
1000.0000 mg | ORAL_TABLET | Freq: Once | ORAL | Status: DC
  Filled 2017-04-02: qty 2

## 2017-04-02 MED ORDER — LORAZEPAM 2 MG/ML IJ SOLN
1.0000 mg | Freq: Once | INTRAMUSCULAR | Status: AC
  Administered 2017-04-02: 1 mg via INTRAMUSCULAR
  Filled 2017-04-02: qty 1

## 2017-04-02 MED ORDER — KETOROLAC TROMETHAMINE 30 MG/ML IJ SOLN
15.0000 mg | Freq: Once | INTRAMUSCULAR | Status: AC
  Administered 2017-04-02: 15 mg via INTRAMUSCULAR
  Filled 2017-04-02: qty 1
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
## 2017-04-02 NOTE — ED Notes (Signed)
Patient transported to CT 

## 2017-04-02 NOTE — ED Notes (Signed)
ED Provider at bedside. 
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
## 2017-04-02 NOTE — Discharge Instructions (Signed)
As discussed, your evaluation today has been largely reassuring.  But, it is important that you monitor your condition carefully, and do not hesitate to return to the ED if you develop new, or concerning changes in your condition. ? ?Otherwise, please follow-up with your physician for appropriate ongoing care. ? ?

## 2017-04-30 ENCOUNTER — Emergency Department (HOSPITAL_COMMUNITY): Payer: Medicaid Other

## 2017-04-30 ENCOUNTER — Encounter (HOSPITAL_COMMUNITY): Payer: Self-pay

## 2017-04-30 ENCOUNTER — Emergency Department (HOSPITAL_COMMUNITY)
Admission: EM | Admit: 2017-04-30 | Discharge: 2017-04-30 | Disposition: A | Discharge status: Home / Self Care | Payer: Medicaid Other | Attending: Emergency Medicine | Admitting: Emergency Medicine

## 2017-04-30 DIAGNOSIS — F17290 Nicotine dependence, other tobacco product, uncomplicated: Secondary | ICD-10-CM | POA: Diagnosis not present

## 2017-04-30 DIAGNOSIS — B9689 Other specified bacterial agents as the cause of diseases classified elsewhere: Secondary | ICD-10-CM

## 2017-04-30 DIAGNOSIS — N760 Acute vaginitis: Secondary | ICD-10-CM | POA: Diagnosis not present

## 2017-04-30 DIAGNOSIS — R300 Dysuria: Secondary | ICD-10-CM | POA: Diagnosis present

## 2017-04-30 DIAGNOSIS — Z79899 Other long term (current) drug therapy: Secondary | ICD-10-CM | POA: Insufficient documentation

## 2017-04-30 DIAGNOSIS — R109 Unspecified abdominal pain: Secondary | ICD-10-CM | POA: Diagnosis not present

## 2017-04-30 DIAGNOSIS — N898 Other specified noninflammatory disorders of vagina: Secondary | ICD-10-CM

## 2017-04-30 LAB — CBC WITH DIFFERENTIAL/PLATELET
Basophils Absolute: 0 10*3/uL (ref 0.0–0.1)
Basophils Relative: 0 %
Eosinophils Absolute: 0.4 10*3/uL (ref 0.0–0.7)
Eosinophils Relative: 5 %
HCT: 38.9 % (ref 36.0–46.0)
Hemoglobin: 13.3 g/dL (ref 12.0–15.0)
Lymphocytes Relative: 28 %
Lymphs Abs: 2.2 10*3/uL (ref 0.7–4.0)
MCH: 27.3 pg (ref 26.0–34.0)
MCHC: 34.2 g/dL (ref 30.0–36.0)
MCV: 79.7 fL (ref 78.0–100.0)
Monocytes Absolute: 0.5 10*3/uL (ref 0.1–1.0)
Monocytes Relative: 6 %
Neutro Abs: 4.9 10*3/uL (ref 1.7–7.7)
Neutrophils Relative %: 61 %
Platelets: 200 10*3/uL (ref 150–400)
RBC: 4.88 MIL/uL (ref 3.87–5.11)
RDW: 13 % (ref 11.5–15.5)
WBC: 8 10*3/uL (ref 4.0–10.5)

## 2017-04-30 LAB — COMPREHENSIVE METABOLIC PANEL
ALT: 22 U/L (ref 14–54)
AST: 18 U/L (ref 15–41)
Albumin: 3.1 g/dL — ABNORMAL LOW (ref 3.5–5.0)
Alkaline Phosphatase: 50 U/L (ref 38–126)
Anion gap: 6 (ref 5–15)
BUN: 12 mg/dL (ref 6–20)
CO2: 23 mmol/L (ref 22–32)
Calcium: 8.2 mg/dL — ABNORMAL LOW (ref 8.9–10.3)
Chloride: 109 mmol/L (ref 101–111)
Creatinine, Ser: 0.6 mg/dL (ref 0.44–1.00)
GFR calc Af Amer: >60 mL/min (ref >60)
GFR calc non Af Amer: >60 mL/min (ref >60)
Glucose, Bld: 117 mg/dL — ABNORMAL HIGH (ref 65–99)
Potassium: 3.6 mmol/L (ref 3.5–5.1)
Sodium: 138 mmol/L (ref 135–145)
Total Bilirubin: 0.4 mg/dL (ref 0.3–1.2)
Total Protein: 5.4 g/dL — ABNORMAL LOW (ref 6.5–8.1)

## 2017-04-30 LAB — POC URINE PREG, ED: Preg Test, Ur: NEGATIVE

## 2017-04-30 LAB — WET PREP, GENITAL
Sperm: NONE SEEN
Trich, Wet Prep: NONE SEEN
Yeast Wet Prep HPF POC: NONE SEEN

## 2017-04-30 LAB — URINALYSIS, ROUTINE W REFLEX MICROSCOPIC
Bacteria, UA: NONE SEEN
Bilirubin Urine: NEGATIVE
Glucose, UA: NEGATIVE mg/dL
Ketones, ur: NEGATIVE mg/dL
Leukocytes, UA: NEGATIVE
Nitrite: NEGATIVE
Protein, ur: NEGATIVE mg/dL
Specific Gravity, Urine: 1.016 (ref 1.005–1.030)
pH: 7 (ref 5.0–8.0)

## 2017-04-30 MED ORDER — DIPHENHYDRAMINE HCL 50 MG/ML IJ SOLN
25.0000 mg | Freq: Once | INTRAMUSCULAR | Status: AC
  Administered 2017-04-30: 25 mg via INTRAVENOUS
  Filled 2017-04-30: qty 1

## 2017-04-30 MED ORDER — SODIUM CHLORIDE 0.9 % IV BOLUS (SEPSIS)
1000.0000 mL | Freq: Once | INTRAVENOUS | Status: AC
  Administered 2017-04-30: 1000 mL via INTRAVENOUS

## 2017-04-30 MED ORDER — METRONIDAZOLE 0.75 % VA GEL: 1.0000 | Freq: Every day | VAGINAL | 0 refills | Status: AC

## 2017-04-30 MED ORDER — KETOROLAC TROMETHAMINE 30 MG/ML IJ SOLN
30.0000 mg | Freq: Once | INTRAMUSCULAR | Status: AC
Start: 2017-04-30 — End: 2017-04-30
  Administered 2017-04-30: 30 mg via INTRAVENOUS
  Filled 2017-04-30: qty 1

## 2017-04-30 MED ORDER — AZITHROMYCIN 250 MG PO TABS: 1000.0000 mg | ORAL_TABLET | Freq: Once | ORAL | Status: DC

## 2017-04-30 MED ORDER — CEFTRIAXONE SODIUM 250 MG IJ SOLR: 250.0000 mg | Freq: Once | INTRAMUSCULAR | Status: DC

## 2017-04-30 NOTE — ED Notes (Signed)
Pt reports she drank a lot Thursday night and since then has had very little urine output. Has been drinking gatorade.

## 2017-04-30 NOTE — ED Notes (Signed)
RN scanned patient's bladder. found.

## 2017-04-30 NOTE — ED Notes (Signed)
RN found urine sample in mini lab and sent down; notified lab technician to be on the lookout and please run asap.

## 2017-04-30 NOTE — Discharge Instructions (Signed)
Medications: MetroGel  Treatment: Apply MetroGel vaginally at bedtime for 5 days. You can take Tylenol as prescribed over-the-counter as needed for your pain. You will be called in 2-3 days if any of your results return positive. Please follow-up with your OB/GYN or the health department for treatment if positive. If positive, also make all of your sexual partners aware that they will need to be treated as well.  Follow-up: Please follow-up with your OB/GYN for further evaluation and treatment of your pain. Please return to the emergency department if you develop any new or worsening symptoms.

## 2017-04-30 NOTE — ED Triage Notes (Signed)
Patient complains of bilateral flank pain and dysuria x 3 days. States that she has seen some blood in urine as well. Chills witrh same

## 2017-04-30 NOTE — ED Notes (Signed)
Pt states Toradol did not work. EDP made aware.

## 2017-04-30 NOTE — ED Provider Notes (Signed)
[facility]-EMER[facility]ENCY DEPT Provider Note   CSN: [id number] Arrival date & time: 04/30/17  0913     History   Chief Complaint Chief Complaint  Patient presents with  . Dysuria    HPI Leah Barry is a 31 y.o. female with history of bipolar 1 disorder, depression, PID, IBS who presents with a few day history of decreased urination, left flank pain, and low back pain. Patient reports it began after drinking a lot of alcohol 4 days ago. Patient reports she has urgency to use the restroom, but however only yields a couple drops. Patient has been drinking a lot of Gatorade. Patient reports that she noticed a foul vaginal odor prior to onset of symptoms, but no significant vaginal discharge. Patient reports intermittent vaginal spotting her Depo-Provera injection. Patient has no history of kidney stones. She reports chills and feeling warm, but no documented fever. Patient also reports some nausea. She denies any chest pain, shortness of breath, vomiting.  HPI  Past Medical History:  Diagnosis Date  . Anxiety   . Apnea, sleep    no CPAP use, states lost machine during move to [place]  . Bipolar 1 disorder (HCC)   . Bipolar disorder (HCC)    no current med.  . Depression   . History of MRSA infection 2010  . Irritable bowel syndrome (IBS)    no current med.  . Pelvic inflammatory disease   . Scalp laceration    staples to be removed 03/13/2014  . Ulnar fracture 03/09/2014   left    Patient Active Problem List   Diagnosis Date Noted  . Dysmenorrhea 05/28/2014  . GC (gonococcus) 05/28/2014  . Chlamydia contact 05/28/2014  . Pain 04/14/2014  . UTI symptoms 04/14/2014  . Routine general medical examination at a health care facility 09/26/2013  . Candidiasis of vulva and vagina 09/26/2013  . Unspecified symptom associated with female genital organs 09/10/2013  . Abnormal uterine bleeding (AUB) 09/10/2013  . PID (pelvic inflammatory disease) 07/29/2013  . Dysuria 07/29/2013  . Screening  examination for venereal disease 07/29/2013  . Chronic PID (chronic pelvic inflammatory disease) 06/30/2013  . Excessive or frequent menstruation 06/30/2013  . Genital herpes, unspecified 06/30/2013  . Unspecified inflammatory disease of female pelvic organs and tissues 03/12/2013    Past Surgical History:  Procedure Laterality Date  . CYST EXCISION     middle of back  . ORIF ULNAR FRACTURE Left 03/16/2014   Procedure: OPEN REDUCTION INTERNAL FIXATION (ORIF) LEFT ULNAR FRACTURE;  Surgeon: Marlowe Shores, MD;  Location: [facility];  Service: Orthopedics;  Laterality: Left;  left  . OVARIAN CYST REMOVAL    . THYROID CYST EXCISION     nodule exc.  . TUBAL LIGATION      OB History    Gravida Para Term Preterm AB Living   5 4 4     4    SAB TAB Ectopic Multiple Live Births           4       Home Medications    Prior to Admission medications   Medication Sig Start Date End Date Taking? Authorizing Provider  ALPRAZolam Prudy Feeler) 0.5 MG tablet Take 0.5 mg by mouth at bedtime as needed for anxiety.    [provider]  ibuprofen (ADVIL,MOTRIN) 800 MG tablet Take 1 tablet (800 mg total) by mouth every 8 (eight) hours as needed. Patient not taking: Reported on 04/02/2017 12/26/16   Brock Bad, MD  medroxyPROGESTERone (DEPO-PROVERA)  150 MG/ML injection Inject 1 mL (150 mg total) into the muscle every 3 (three) months. 12/26/16   Brock Bad, MD  metroNIDAZOLE (METROGEL) 0.75 % vaginal gel Place 1 Applicatorful vaginally at bedtime. 04/30/17 05/05/17  Emi Holes, PA-C  Multiple Vitamins-Iron (MULTIVITAMINS WITH IRON) TABS tablet Take 1 tablet by mouth daily.    [provider]  ondansetron (ZOFRAN ODT) 4 MG disintegrating tablet Take 1 tablet (4 mg total) by mouth every 8 (eight) hours as needed for nausea or vomiting. Patient not taking: Reported on 04/02/2017 01/31/17   Liberty Handy, PA-C  oxyCODONE-acetaminophen (PERCOCET/ROXICET) 5-325  MG tablet Take 1 tablet by mouth every 4 (four) hours as needed for severe pain.    [provider]    Family History Family History  Problem Relation Age of Onset  . Heart disease Mother   . Cancer Maternal Grandmother        breast  . Cancer Paternal Grandmother        breast    Social History Social History  Substance Use Topics  . Smoking status: Current Some Day Smoker    Years: 3.00    Types: Cigars  . Smokeless tobacco: Never Used     Comment: 1-2 cig./day  . Alcohol use 0.0 oz/week     Comment: drinks heavily occassionally     Allergies   Contrast media [iodinated diagnostic agents]; Latuda [lurasidone hcl]; Sulfa antibiotics; Bee venom; Flagyl [metronidazole]; Naproxen; Tramadol; and Toradol [ketorolac tromethamine]   Review of Systems Review of Systems  Constitutional: Negative for chills and fever.  HENT: Negative for facial swelling and sore throat.   Respiratory: Negative for shortness of breath.   Cardiovascular: Negative for chest pain.  Gastrointestinal: Positive for nausea. Negative for abdominal pain and vomiting.  Genitourinary: Positive for decreased urine volume, difficulty urinating, dysuria, flank pain, vaginal bleeding (spotting) and vaginal discharge.  Musculoskeletal: Positive for back pain.  Skin: Negative for rash and wound.  Neurological: Negative for headaches.  Psychiatric/Behavioral: The patient is not nervous/anxious.      Physical Exam Updated Vital Signs BP 120/80   Pulse 68   Temp 98.1 F (36.7 C) (Oral)   Resp 16   SpO2 100%   Physical Exam  Constitutional: She appears well-developed and well-nourished. No distress.  HENT:  Head: Normocephalic and atraumatic.  Mouth/Throat: Oropharynx is clear and moist. No oropharyngeal exudate.  Eyes: Conjunctivae are normal. Pupils are equal, round, and reactive to light. Right eye exhibits no discharge. Left eye exhibits no discharge. No scleral icterus.  Neck: Normal range  of motion. Neck supple. No thyromegaly present.  Cardiovascular: Normal rate, regular rhythm, normal heart sounds and intact distal pulses.  Exam reveals no gallop and no friction rub.   No murmur heard. Pulmonary/Chest: Effort normal and breath sounds normal. No stridor. No respiratory distress. She has no wheezes. She has no rales.  Abdominal: Soft. Bowel sounds are normal. She exhibits no distension. There is no tenderness. There is no rebound, no guarding and no CVA tenderness.    Genitourinary: Pelvic exam was performed with patient supine. Uterus is tender. Right adnexum displays no mass and no tenderness. Left adnexum displays tenderness. Left adnexum displays no mass. There is bleeding in the vagina.  Genitourinary Comments: Chaperone present  Musculoskeletal: She exhibits no edema.  Lymphadenopathy:    She has no cervical adenopathy.  Neurological: She is alert. Coordination normal.  Skin: Skin is warm and dry. No rash noted. She is not  diaphoretic. No pallor.  Psychiatric: She has a normal mood and affect.  Nursing note and vitals reviewed.    ED Treatments / Results  Labs (all labs ordered are listed, but only abnormal results are displayed) Labs Reviewed  WET PREP, GENITAL - Abnormal; Notable for the following:       Result Value   Clue Cells Wet Prep HPF POC PRESENT (*)    WBC, Wet Prep HPF POC MODERATE (*)    All other components within normal limits  URINALYSIS, ROUTINE W REFLEX MICROSCOPIC - Abnormal; Notable for the following:    Color, Urine STRAW (*)    Hgb urine dipstick MODERATE (*)    Squamous Epithelial / LPF 0-5 (*)    All other components within normal limits  COMPREHENSIVE METABOLIC PANEL - Abnormal; Notable for the following:    Glucose, Bld 117 (*)    Calcium 8.2 (*)    Total Protein 5.4 (*)    Albumin 3.1 (*)    All other components within normal limits  CBC WITH DIFFERENTIAL/PLATELET  RPR  HIV ANTIBODY (ROUTINE TESTING)  POC URINE PREG, ED    GC/CHLAMYDIA PROBE AMP ([place]) NOT AT [facility]    EKG  EKG Interpretation None       Radiology [name] Transvaginal Non-ob  Result Date: 04/30/2017 CLINICAL DATA:  Left pelvic and back pain x2 days, history of ectopic pregnancy EXAM: TRANSABDOMINAL AND TRANSVAGINAL ULTRASOUND OF PELVIS DOPPLER ULTRASOUND OF OVARIES TECHNIQUE: Both transabdominal and transvaginal ultrasound examinations of the pelvis were performed. Transabdominal technique was performed for global imaging of the pelvis including uterus, ovaries, adnexal regions, and pelvic cul-de-sac. It was necessary to proceed with endovaginal exam following the transabdominal exam to visualize the endometrium. Color and duplex Doppler ultrasound was utilized to evaluate blood flow to the ovaries. COMPARISON:  03/13/2017 FINDINGS: Uterus Measurements: 9.9 x 4.5 x 5.4 cm. No fibroids or other mass visualized. Endometrium Thickness: 4 mm.  No focal abnormality visualized. Right ovary Measurements: 2.9 x 2.1 x 1.9 cm. Normal appearance/no adnexal mass. Left ovary Measurements: 3.2 x 1.7 x 2.0 cm. Normal appearance/no adnexal mass. Pulsed Doppler evaluation of both ovaries demonstrates normal low-resistance arterial and venous waveforms. Other findings No abnormal free fluid. IMPRESSION: Negative pelvic ultrasound. No evidence of ovarian torsion. Electronically Signed   By: Charline BillsSriyesh  Krishnan M.D.   On: 04/30/2017 13:33   [name] Pelvis Complete  Result Date: 04/30/2017 CLINICAL DATA:  Left pelvic and back pain x2 days, history of ectopic pregnancy EXAM: TRANSABDOMINAL AND TRANSVAGINAL ULTRASOUND OF PELVIS DOPPLER ULTRASOUND OF OVARIES TECHNIQUE: Both transabdominal and transvaginal ultrasound examinations of the pelvis were performed. Transabdominal technique was performed for global imaging of the pelvis including uterus, ovaries, adnexal regions, and pelvic cul-de-sac. It was necessary to proceed with endovaginal exam following the transabdominal exam to  visualize the endometrium. Color and duplex Doppler ultrasound was utilized to evaluate blood flow to the ovaries. COMPARISON:  03/13/2017 FINDINGS: Uterus Measurements: 9.9 x 4.5 x 5.4 cm. No fibroids or other mass visualized. Endometrium Thickness: 4 mm.  No focal abnormality visualized. Right ovary Measurements: 2.9 x 2.1 x 1.9 cm. Normal appearance/no adnexal mass. Left ovary Measurements: 3.2 x 1.7 x 2.0 cm. Normal appearance/no adnexal mass. Pulsed Doppler evaluation of both ovaries demonstrates normal low-resistance arterial and venous waveforms. Other findings No abnormal free fluid. IMPRESSION: Negative pelvic ultrasound. No evidence of ovarian torsion. Electronically Signed   By: Charline BillsSriyesh  Krishnan M.D.   On: 04/30/2017 13:33   [name] Art/ven  Flow Abd Pelv Doppler  Result Date: 04/30/2017 CLINICAL DATA:  Left pelvic and back pain x2 days, history of ectopic pregnancy EXAM: TRANSABDOMINAL AND TRANSVAGINAL ULTRASOUND OF PELVIS DOPPLER ULTRASOUND OF OVARIES TECHNIQUE: Both transabdominal and transvaginal ultrasound examinations of the pelvis were performed. Transabdominal technique was performed for global imaging of the pelvis including uterus, ovaries, adnexal regions, and pelvic cul-de-sac. It was necessary to proceed with endovaginal exam following the transabdominal exam to visualize the endometrium. Color and duplex Doppler ultrasound was utilized to evaluate blood flow to the ovaries. COMPARISON:  03/13/2017 FINDINGS: Uterus Measurements: 9.9 x 4.5 x 5.4 cm. No fibroids or other mass visualized. Endometrium Thickness: 4 mm.  No focal abnormality visualized. Right ovary Measurements: 2.9 x 2.1 x 1.9 cm. Normal appearance/no adnexal mass. Left ovary Measurements: 3.2 x 1.7 x 2.0 cm. Normal appearance/no adnexal mass. Pulsed Doppler evaluation of both ovaries demonstrates normal low-resistance arterial and venous waveforms. Other findings No abnormal free fluid. IMPRESSION: Negative pelvic ultrasound. No  evidence of ovarian torsion. Electronically Signed   By: Charline Bills M.D.   On: 04/30/2017 13:33    Procedures Procedures (including critical care time)  Medications Ordered in ED Medications  sodium chloride 0.9 % bolus 1,000 mL (0 mLs Intravenous Stopped 04/30/17 1138)  ketorolac (TORADOL) 30 MG/ML injection 30 mg (30 mg Intravenous Given 04/30/17 1146)  diphenhydrAMINE (BENADRYL) injection 25 mg (25 mg Intravenous Given 04/30/17 1146)     Initial Impression / Assessment and Plan / ED Course  I have reviewed the triage vital signs and the nursing notes.  Pertinent labs & imaging results that were available during my care of the patient were reviewed by me and considered in my medical decision making (see chart for details).     CBC, CMP within normal limits. GC/chlamydia sent and pending. HIV, RPR sent and pending. Urine pregnancy negative. Wet prep shows clue cells. Pelvic ultrasound is negative. Patient has had multiple CTAP scans and ultrasounds for similar symptoms in the past. Patient feels like her symptoms may be related to bacterial vaginosis. Although this probably is because of her pain, I will treat with Metro gel. I don't see indication to repeat CT scan today considering labs are unremarkable. Patient chooses to wait for treatment for GC chlamydia. She is advised to follow-up with OB/GYN as soon as possible for further evaluation and treatment. Return precautions discussed. Patient understands and agrees with plan. Patient vitals stable throughout ED course and discharged in satisfactory condition. I discussed patient case with Dr. Madilyn Hook who guided the patient's management and agrees with plan.   Final Clinical Impressions(s) / ED Diagnoses   Final diagnoses:  BV (bacterial vaginosis)  Left flank pain    New Prescriptions Discharge Medication List as of 04/30/2017  2:48 PM       Emi Holes, PA-C 04/30/17 1934    Tilden Fossa, MD 05/02/17 1114

## 2017-05-01 LAB — RPR: RPR Ser Ql: NONREACTIVE

## 2017-05-01 LAB — GC/CHLAMYDIA PROBE AMP (~~LOC~~) NOT AT ARMC
Chlamydia: NEGATIVE
Neisseria Gonorrhea: NEGATIVE

## 2017-05-01 LAB — HIV ANTIBODY (ROUTINE TESTING W REFLEX): HIV Screen 4th Generation wRfx: NONREACTIVE

## 2017-05-26 ENCOUNTER — Encounter (HOSPITAL_COMMUNITY): Payer: Self-pay | Admitting: Emergency Medicine

## 2017-05-26 ENCOUNTER — Emergency Department (HOSPITAL_COMMUNITY)
Admission: EM | Admit: 2017-05-26 | Discharge: 2017-05-27 | Disposition: A | Discharge status: Home / Self Care | Payer: Medicaid Other | Attending: Emergency Medicine | Admitting: Emergency Medicine

## 2017-05-26 DIAGNOSIS — N898 Other specified noninflammatory disorders of vagina: Secondary | ICD-10-CM | POA: Insufficient documentation

## 2017-05-26 DIAGNOSIS — R1032 Left lower quadrant pain: Secondary | ICD-10-CM | POA: Diagnosis present

## 2017-05-26 DIAGNOSIS — Z79899 Other long term (current) drug therapy: Secondary | ICD-10-CM | POA: Insufficient documentation

## 2017-05-26 DIAGNOSIS — Z5329 Procedure and treatment not carried out because of patient's decision for other reasons: Secondary | ICD-10-CM | POA: Diagnosis not present

## 2017-05-26 DIAGNOSIS — R300 Dysuria: Secondary | ICD-10-CM | POA: Insufficient documentation

## 2017-05-26 DIAGNOSIS — R110 Nausea: Secondary | ICD-10-CM | POA: Insufficient documentation

## 2017-05-26 DIAGNOSIS — Z87891 Personal history of nicotine dependence: Secondary | ICD-10-CM | POA: Insufficient documentation

## 2017-05-26 NOTE — ED Triage Notes (Signed)
Pt states she has left lower abd pain that started about a week ago  Pt states she has had dizziness and nausea  Pt states the pain has been intermittent but today it has been more persistent and more intense  Pt states she has been taking percocet and ibuprofen for the pain but it is not helping

## 2017-05-27 ENCOUNTER — Emergency Department (HOSPITAL_COMMUNITY)
Admission: EM | Admit: 2017-05-27 | Discharge: 2017-05-27 | Disposition: A | Discharge status: Home / Self Care | Payer: Medicaid Other | Source: Home / Self Care | Attending: Emergency Medicine | Admitting: Emergency Medicine

## 2017-05-27 ENCOUNTER — Emergency Department (HOSPITAL_COMMUNITY): Payer: Medicaid Other

## 2017-05-27 ENCOUNTER — Encounter (HOSPITAL_COMMUNITY): Payer: Self-pay | Admitting: Emergency Medicine

## 2017-05-27 DIAGNOSIS — R102 Pelvic and perineal pain: Secondary | ICD-10-CM

## 2017-05-27 DIAGNOSIS — Z5320 Procedure and treatment not carried out because of patient's decision for unspecified reasons: Secondary | ICD-10-CM | POA: Insufficient documentation

## 2017-05-27 DIAGNOSIS — F17290 Nicotine dependence, other tobacco product, uncomplicated: Secondary | ICD-10-CM | POA: Insufficient documentation

## 2017-05-27 DIAGNOSIS — R1032 Left lower quadrant pain: Secondary | ICD-10-CM | POA: Insufficient documentation

## 2017-05-27 DIAGNOSIS — Z8614 Personal history of Methicillin resistant Staphylococcus aureus infection: Secondary | ICD-10-CM | POA: Insufficient documentation

## 2017-05-27 LAB — I-STAT BETA HCG BLOOD, ED (MC, WL, AP ONLY): I-stat hCG, quantitative: <5 m[IU]/mL (ref <5)

## 2017-05-27 LAB — URINALYSIS, ROUTINE W REFLEX MICROSCOPIC
Bilirubin Urine: NEGATIVE
Glucose, UA: NEGATIVE mg/dL
Hgb urine dipstick: NEGATIVE
Ketones, ur: NEGATIVE mg/dL
Leukocytes, UA: NEGATIVE
Nitrite: NEGATIVE
Protein, ur: NEGATIVE mg/dL
Specific Gravity, Urine: 1.02 (ref 1.005–1.030)
pH: 6 (ref 5.0–8.0)

## 2017-05-27 LAB — COMPREHENSIVE METABOLIC PANEL
ALT: 22 U/L (ref 14–54)
AST: 17 U/L (ref 15–41)
Albumin: 3.6 g/dL (ref 3.5–5.0)
Alkaline Phosphatase: 63 U/L (ref 38–126)
Anion gap: 6 (ref 5–15)
BUN: 13 mg/dL (ref 6–20)
CO2: 25 mmol/L (ref 22–32)
Calcium: 8.7 mg/dL — ABNORMAL LOW (ref 8.9–10.3)
Chloride: 109 mmol/L (ref 101–111)
Creatinine, Ser: 0.54 mg/dL (ref 0.44–1.00)
GFR calc Af Amer: >60 mL/min (ref >60)
GFR calc non Af Amer: >60 mL/min (ref >60)
Glucose, Bld: 101 mg/dL — ABNORMAL HIGH (ref 65–99)
Potassium: 3.8 mmol/L (ref 3.5–5.1)
Sodium: 140 mmol/L (ref 135–145)
Total Bilirubin: <0.1 mg/dL — ABNORMAL LOW (ref 0.3–1.2)
Total Protein: 6.2 g/dL — ABNORMAL LOW (ref 6.5–8.1)

## 2017-05-27 LAB — CBC
HCT: 38 % (ref 36.0–46.0)
Hemoglobin: 13.1 g/dL (ref 12.0–15.0)
MCH: 26.8 pg (ref 26.0–34.0)
MCHC: 34.5 g/dL (ref 30.0–36.0)
MCV: 77.9 fL — ABNORMAL LOW (ref 78.0–100.0)
Platelets: 213 10*3/uL (ref 150–400)
RBC: 4.88 MIL/uL (ref 3.87–5.11)
RDW: 12.7 % (ref 11.5–15.5)
WBC: 9.9 10*3/uL (ref 4.0–10.5)

## 2017-05-27 LAB — WET PREP, GENITAL
Sperm: NONE SEEN
Trich, Wet Prep: NONE SEEN
Yeast Wet Prep HPF POC: NONE SEEN

## 2017-05-27 LAB — LIPASE, BLOOD: Lipase: 29 U/L (ref 11–51)

## 2017-05-27 MED ORDER — PROMETHAZINE HCL 25 MG PO TABS: 25.0000 mg | ORAL_TABLET | Freq: Four times a day (QID) | ORAL | 1 refills | Status: DC | PRN

## 2017-05-27 MED ORDER — DIPHENHYDRAMINE HCL 50 MG/ML IJ SOLN
25.0000 mg | Freq: Once | INTRAMUSCULAR | Status: AC
  Administered 2017-05-27: 25 mg via INTRAVENOUS
  Filled 2017-05-27: qty 1

## 2017-05-27 MED ORDER — HYDROCODONE-ACETAMINOPHEN 5-325 MG PO TABS: 1.0000 | ORAL_TABLET | Freq: Four times a day (QID) | ORAL | 0 refills | Status: DC | PRN

## 2017-05-27 MED ORDER — MORPHINE SULFATE (PF) 4 MG/ML IV SOLN
4.0000 mg | Freq: Once | INTRAVENOUS | Status: AC
  Administered 2017-05-27: 4 mg via INTRAVENOUS
  Filled 2017-05-27: qty 1

## 2017-05-27 MED ORDER — SODIUM CHLORIDE 0.9 % IV BOLUS (SEPSIS)
1000.0000 mL | Freq: Once | INTRAVENOUS | Status: AC
  Administered 2017-05-27: 1000 mL via INTRAVENOUS

## 2017-05-27 MED ORDER — ONDANSETRON HCL 4 MG/2ML IJ SOLN
4.0000 mg | Freq: Once | INTRAMUSCULAR | Status: AC
  Administered 2017-05-27: 4 mg via INTRAVENOUS
  Filled 2017-05-27: qty 2

## 2017-05-27 MED ORDER — FENTANYL CITRATE (PF) 100 MCG/2ML IJ SOLN
50.0000 ug | Freq: Once | INTRAMUSCULAR | Status: AC
  Administered 2017-05-27: 50 ug via INTRAVENOUS
  Filled 2017-05-27: qty 2

## 2017-05-27 NOTE — ED Notes (Signed)
Pt reports itching all over after receiving morphine. Provider to be notified.

## 2017-05-27 NOTE — Discharge Instructions (Signed)
Follow-up with OB/GYN. Also can follow-up with the [facility]. Take pain medication as directed. Can use a Phenergan to help with nausea and vomiting. Work note provided. Return for new or worse symptoms. Copies of your lab and test results from last night provided.

## 2017-05-27 NOTE — ED Notes (Signed)
Pt reports improvement in itching but still reporting pain. Provider aware.

## 2017-05-27 NOTE — ED Provider Notes (Signed)
[facility]-EMERGENCY DEPT Provider Note   CSN: [id number] Arrival date & time: 05/27/17  1208     History   Chief Complaint Chief Complaint  Patient presents with  . Abdominal Pain    HPI Leah Barry is a 31 y.o. female.  Patient seen last evening into the early morning hours in the emergency department with complaint of left lower quadrant pelvic pain. Patient did have pelvic exam. Did have ultrasound of the pelvis done transvaginally as well as Dopplers. Also did have pelvic wet prep and cultures done. Patient was noted to have left adnexal tenderness. Patient left prior to results. Patient is back to have her results. The patient has had the left lower quadrant abdominal pain for about a week. Associated with dizziness and nausea. No vomiting.      Past Medical History:  Diagnosis Date  . Anxiety   . Apnea, sleep    no CPAP use, states lost machine during move to [place]  . Bipolar 1 disorder (HCC)   . Bipolar disorder (HCC)    no current med.  . Depression   . History of MRSA infection 2010  . Irritable bowel syndrome (IBS)    no current med.  . Pelvic inflammatory disease   . Scalp laceration    staples to be removed 03/13/2014  . Ulnar fracture 03/09/2014   left    Patient Active Problem List   Diagnosis Date Noted  . Dysmenorrhea 05/28/2014  . GC (gonococcus) 05/28/2014  . Chlamydia contact 05/28/2014  . Pain 04/14/2014  . UTI symptoms 04/14/2014  . Routine general medical examination at a health care facility 09/26/2013  . Candidiasis of vulva and vagina 09/26/2013  . Unspecified symptom associated with female genital organs 09/10/2013  . Abnormal uterine bleeding (AUB) 09/10/2013  . PID (pelvic inflammatory disease) 07/29/2013  . Dysuria 07/29/2013  . Screening examination for venereal disease 07/29/2013  . Chronic PID (chronic pelvic inflammatory disease) 06/30/2013  . Excessive or frequent menstruation 06/30/2013  . Genital herpes, unspecified 06/30/2013    . Unspecified inflammatory disease of female pelvic organs and tissues 03/12/2013    Past Surgical History:  Procedure Laterality Date  . CYST EXCISION     middle of back  . ORIF ULNAR FRACTURE Left 03/16/2014   Procedure: OPEN REDUCTION INTERNAL FIXATION (ORIF) LEFT ULNAR FRACTURE;  Surgeon: Marlowe Shores, MD;  Location: [facility];  Service: Orthopedics;  Laterality: Left;  left  . OVARIAN CYST REMOVAL    . THYROID CYST EXCISION     nodule exc.  . TUBAL LIGATION      OB History    Gravida Para Term Preterm AB Living   5 4 4     4    SAB TAB Ectopic Multiple Live Births           4       Home Medications    Prior to Admission medications   Medication Sig Start Date End Date Taking? Authorizing Provider  ALPRAZolam Prudy Feeler) 0.5 MG tablet Take 0.5 mg by mouth at bedtime as needed for anxiety.   Yes [provider]  ibuprofen (ADVIL,MOTRIN) 200 MG tablet Take 600 mg by mouth every 6 (six) hours as needed.   Yes [provider]  Multiple Vitamins-Iron (MULTIVITAMINS WITH IRON) TABS tablet Take 1 tablet by mouth daily.   Yes [provider]  oxyCODONE-acetaminophen (PERCOCET/ROXICET) 5-325 MG tablet Take 1 tablet by mouth every 4 (four) hours as needed for severe pain.  Yes [provider]  HYDROcodone-acetaminophen (NORCO/VICODIN) 5-325 MG tablet Take 1-2 tablets by mouth every 6 (six) hours as needed for moderate pain. 05/27/17   Vanetta Mulders, MD  promethazine (PHENERGAN) 25 MG tablet Take 1 tablet (25 mg total) by mouth every 6 (six) hours as needed for nausea or vomiting. 05/27/17   Vanetta Mulders, MD    Family History Family History  Problem Relation Age of Onset  . Heart disease Mother   . Cancer Maternal Grandmother        breast  . Cancer Paternal Grandmother        breast    Social History Social History  Substance Use Topics  . Smoking status: Former Smoker    Years: 3.00    Types: Cigars  . Smokeless  tobacco: Never Used     Comment: 1-2 cig./day  . Alcohol use 0.0 oz/week     Comment: occ     Allergies   Contrast media [iodinated diagnostic agents]; Latuda [lurasidone hcl]; Sulfa antibiotics; Bee venom; Flagyl [metronidazole]; Naproxen; Tramadol; and Toradol [ketorolac tromethamine]   Review of Systems Review of Systems  Constitutional: Negative for fever.  HENT: Negative for congestion.   Eyes: Negative for visual disturbance.  Respiratory: Negative for shortness of breath.   Gastrointestinal: Positive for abdominal pain and nausea. Negative for vomiting.  Genitourinary: Positive for pelvic pain.  Musculoskeletal: Negative for back pain.  Skin: Negative for rash.  Neurological: Positive for dizziness and light-headedness.  Hematological: Does not bruise/bleed easily.  Psychiatric/Behavioral: Negative for confusion.     Physical Exam Updated Vital Signs BP 117/71   Pulse 84   Temp 98.5 F (36.9 C) (Oral)   Resp 16   SpO2 100%   Physical Exam  Constitutional: She is oriented to person, place, and time. She appears well-developed and well-nourished. No distress.  HENT:  Head: Normocephalic and atraumatic.  Mouth/Throat: Oropharynx is clear and moist.  Eyes: Pupils are equal, round, and reactive to light. Conjunctivae and EOM are normal.  Neck: Normal range of motion. Neck supple.  Cardiovascular: Normal rate, regular rhythm and normal heart sounds.   Pulmonary/Chest: Effort normal and breath sounds normal. No respiratory distress.  Abdominal: Soft. Bowel sounds are normal. There is no tenderness.  Musculoskeletal: Normal range of motion.  Neurological: She is alert and oriented to person, place, and time. No cranial nerve deficit or sensory deficit. She exhibits normal muscle tone. Coordination normal.  Skin: Skin is warm.  Nursing note and vitals reviewed.    ED Treatments / Results  Labs (all labs ordered are listed, but only abnormal results are  displayed) Labs Reviewed - No data to display  EKG  EKG Interpretation None       Radiology US Transvaginal Non-ob  Result Date: 05/27/2017 CLINICAL DATA:  31 year old female with pelvic pain for 1 week. EXAM: TRANSABDOMINAL AND TRANSVAGINAL ULTRASOUND OF PELVIS DOPPLER ULTRASOUND OF OVARIES TECHNIQUE: Both transabdominal and transvaginal ultrasound examinations of the pelvis were performed. Transabdominal technique was performed for global imaging of the pelvis including uterus, ovaries, adnexal regions, and pelvic cul-de-sac. It was necessary to proceed with endovaginal exam following the transabdominal exam to visualize the endometrium and ovaries. Color and duplex Doppler ultrasound was utilized to evaluate blood flow to the ovaries. COMPARISON:  Pelvic ultrasound dated 04/30/2017 FINDINGS: Uterus Measurements: 9.2 x 5.3 x 5.2 cm. No fibroids or other mass visualized. Endometrium Thickness: 9 mm.  No focal abnormality visualized. Right ovary Measurements: 2.4 x 1.9 x 1.7 cm.  Normal appearance/no adnexal mass. Left ovary Measurements: 3.5 x 2.3 x 2.4 cm. Normal appearance/no adnexal mass. A 1.6 x 1.4 x 1.5 cm dominant follicle/cyst. Pulsed Doppler evaluation of both ovaries demonstrates normal low-resistance arterial and venous waveforms. Other findings Mildly dilated pelvic vasculature noted, right greater left. Correlation with clinical exam is recommended to exclude pelvic congestion syndrome. IMPRESSION: 1. Unremarkable uterus and ovaries.  Bilateral ovarian Doppler flow. 2. Mildly dilated pelvic vasculature. Electronically Signed   By: Elgie CollardArash  Radparvar M.D.   On: 05/27/2017 04:21   [name] Pelvis Complete  Result Date: 05/27/2017 CLINICAL DATA:  31 year old female with pelvic pain for 1 week. EXAM: TRANSABDOMINAL AND TRANSVAGINAL ULTRASOUND OF PELVIS DOPPLER ULTRASOUND OF OVARIES TECHNIQUE: Both transabdominal and transvaginal ultrasound examinations of the pelvis were performed. Transabdominal  technique was performed for global imaging of the pelvis including uterus, ovaries, adnexal regions, and pelvic cul-de-sac. It was necessary to proceed with endovaginal exam following the transabdominal exam to visualize the endometrium and ovaries. Color and duplex Doppler ultrasound was utilized to evaluate blood flow to the ovaries. COMPARISON:  Pelvic ultrasound dated 04/30/2017 FINDINGS: Uterus Measurements: 9.2 x 5.3 x 5.2 cm. No fibroids or other mass visualized. Endometrium Thickness: 9 mm.  No focal abnormality visualized. Right ovary Measurements: 2.4 x 1.9 x 1.7 cm. Normal appearance/no adnexal mass. Left ovary Measurements: 3.5 x 2.3 x 2.4 cm. Normal appearance/no adnexal mass. A 1.6 x 1.4 x 1.5 cm dominant follicle/cyst. Pulsed Doppler evaluation of both ovaries demonstrates normal low-resistance arterial and venous waveforms. Other findings Mildly dilated pelvic vasculature noted, right greater left. Correlation with clinical exam is recommended to exclude pelvic congestion syndrome. IMPRESSION: 1. Unremarkable uterus and ovaries.  Bilateral ovarian Doppler flow. 2. Mildly dilated pelvic vasculature. Electronically Signed   By: Elgie CollardArash  Radparvar M.D.   On: 05/27/2017 04:21   [name] Art/ven Flow Abd Pelv Doppler  Result Date: 05/27/2017 CLINICAL DATA:  31 year old female with pelvic pain for 1 week. EXAM: TRANSABDOMINAL AND TRANSVAGINAL ULTRASOUND OF PELVIS DOPPLER ULTRASOUND OF OVARIES TECHNIQUE: Both transabdominal and transvaginal ultrasound examinations of the pelvis were performed. Transabdominal technique was performed for global imaging of the pelvis including uterus, ovaries, adnexal regions, and pelvic cul-de-sac. It was necessary to proceed with endovaginal exam following the transabdominal exam to visualize the endometrium and ovaries. Color and duplex Doppler ultrasound was utilized to evaluate blood flow to the ovaries. COMPARISON:  Pelvic ultrasound dated 04/30/2017 FINDINGS: Uterus  Measurements: 9.2 x 5.3 x 5.2 cm. No fibroids or other mass visualized. Endometrium Thickness: 9 mm.  No focal abnormality visualized. Right ovary Measurements: 2.4 x 1.9 x 1.7 cm. Normal appearance/no adnexal mass. Left ovary Measurements: 3.5 x 2.3 x 2.4 cm. Normal appearance/no adnexal mass. A 1.6 x 1.4 x 1.5 cm dominant follicle/cyst. Pulsed Doppler evaluation of both ovaries demonstrates normal low-resistance arterial and venous waveforms. Other findings Mildly dilated pelvic vasculature noted, right greater left. Correlation with clinical exam is recommended to exclude pelvic congestion syndrome. IMPRESSION: 1. Unremarkable uterus and ovaries.  Bilateral ovarian Doppler flow. 2. Mildly dilated pelvic vasculature. Electronically Signed   By: Elgie CollardArash  Radparvar M.D.   On: 05/27/2017 04:21    Procedures Procedures (including critical care time)  Medications Ordered in ED Medications - No data to display   Initial Impression / Assessment and Plan / ED Course  I have reviewed the triage vital signs and the nursing notes.  Pertinent labs & imaging results that were available during my care of the patient were reviewed by  me and considered in my medical decision making (see chart for details).    Patient seen last evening for left lower quadrant abdominal pelvic pain. Patient left prior to ultrasound results. Her lab results. Patient returns to get those results.  ED visit from last evening reviewed. Patient had pelvic exam. That had some left adnexal tenderness. No cervical motion tenderness no uterine tenderness no right adnexal tenderness. Wet prep was positive for some clue cells but few white blood cells. Patient's had positive clue cells on her pelvic exams over the last several months. Suspect that this is not clinically relevant. Ultrasound had no significant abnormalities. There was some follicle cyst on the left ovary. Perhaps could be related to the pain. Pain also could be related to early  PID. Patient without any tenderness on abdominal exam here today.  Lab and ultrasound results provided to the patient. Patient understands that cultures done during the pelvic are still pending. Will treat her pain with Phenergan and a short course of hydrocodone. Patient can follow-up with OB/GYN. Also can follow-up with the women's acute care clinic. She will return for any new or worse symptoms. She does understand pain starts to spread throughout the pelvic area that it may very well be PID and therefore difficult treatment would be required.  In addition pregnancy test and urinalysis were both negative. No evidence urinary tract infection.  Patient is nontoxic no acute distress. Patient's blood pressure here today is normal.   Final Clinical Impressions(s) / ED Diagnoses   Final diagnoses:  Adnexal tenderness, left    New Prescriptions New Prescriptions   HYDROCODONE-ACETAMINOPHEN (NORCO/VICODIN) 5-325 MG TABLET    Take 1-2 tablets by mouth every 6 (six) hours as needed for moderate pain.   PROMETHAZINE (PHENERGAN) 25 MG TABLET    Take 1 tablet (25 mg total) by mouth every 6 (six) hours as needed for nausea or vomiting.     Vanetta Mulders, MD 05/27/17 1359

## 2017-05-27 NOTE — ED Notes (Signed)
Pt advised staff that she was ready to leave and that she couldn't wait any longer. IV access removed and pt ambulated out of department without difficulty. Dahlia ClientHannah, PA notified.

## 2017-05-27 NOTE — ED Triage Notes (Signed)
Pt reports she has been having abd pain and dizziness. Was seen here for the same last night, and had a work up done. However, she left last night before she got her results. Symptoms have not changed since yesterday.

## 2017-05-27 NOTE — Discharge Instructions (Signed)
1. Medications: usual home medications °2. Treatment: rest, drink plenty of fluids, advance diet slowly °3. Follow Up: Please followup with your primary doctor in 2 days for discussion of your diagnoses and further evaluation after today's visit; if you do not have a primary care doctor use the resource guide provided to find one; Please return to the ER for persistent vomiting, high fevers or worsening symptoms °

## 2017-05-27 NOTE — ED Provider Notes (Signed)
[facility]-EMERGENCY DEPT Provider Note   CSN: [id number] Arrival date & time: 05/26/17  2307     History   Chief Complaint Chief Complaint  Patient presents with  . Abdominal Pain    HPI Leah Barry is a 31 y.o. female with a hx of bipolar disorder, anemia, PID, irritable bowel syndrome presents to the Emergency Department complaining of gradual, persistent, progressively worsening left lower quadrant abdominal pain onset approximately one week ago but worsening in the last 24 hours.patient reports associated nausea without vomiting. She has taken Percocet at home without relief of her pain but has not taking any antiemetics. Patient reports severe pain when she urinates but has no specific dysuria, urinary frequency or urinary urgency. She denies vaginal discharge.  Patient denies fevers or chills, chest pain, shortness of breath, diarrhea, weakness, dizziness, syncope. She denies recent international travel or sick contacts.   The history is provided by the patient and medical records. No language interpreter was used.    Past Medical History:  Diagnosis Date  . Anxiety   . Apnea, sleep    no CPAP use, states lost machine during move to [place]  . Bipolar 1 disorder (HCC)   . Bipolar disorder (HCC)    no current med.  . Depression   . History of MRSA infection 2010  . Irritable bowel syndrome (IBS)    no current med.  . Pelvic inflammatory disease   . Scalp laceration    staples to be removed 03/13/2014  . Ulnar fracture 03/09/2014   left    Patient Active Problem List   Diagnosis Date Noted  . Dysmenorrhea 05/28/2014  . GC (gonococcus) 05/28/2014  . Chlamydia contact 05/28/2014  . Pain 04/14/2014  . UTI symptoms 04/14/2014  . Routine general medical examination at a health care facility 09/26/2013  . Candidiasis of vulva and vagina 09/26/2013  . Unspecified symptom associated with female genital organs 09/10/2013  . Abnormal uterine bleeding (AUB) 09/10/2013  . PID  (pelvic inflammatory disease) 07/29/2013  . Dysuria 07/29/2013  . Screening examination for venereal disease 07/29/2013  . Chronic PID (chronic pelvic inflammatory disease) 06/30/2013  . Excessive or frequent menstruation 06/30/2013  . Genital herpes, unspecified 06/30/2013  . Unspecified inflammatory disease of female pelvic organs and tissues 03/12/2013    Past Surgical History:  Procedure Laterality Date  . CYST EXCISION     middle of back  . ORIF ULNAR FRACTURE Left 03/16/2014   Procedure: OPEN REDUCTION INTERNAL FIXATION (ORIF) LEFT ULNAR FRACTURE;  Surgeon: Marlowe Shores, MD;  Location: [facility];  Service: Orthopedics;  Laterality: Left;  left  . OVARIAN CYST REMOVAL    . THYROID CYST EXCISION     nodule exc.  . TUBAL LIGATION      OB History    Gravida Para Term Preterm AB Living   5 4 4     4    SAB TAB Ectopic Multiple Live Births           4       Home Medications    Prior to Admission medications   Medication Sig Start Date End Date Taking? Authorizing Provider  ALPRAZolam Prudy Feeler) 0.5 MG tablet Take 0.5 mg by mouth at bedtime as needed for anxiety.   Yes [provider]  medroxyPROGESTERone (DEPO-PROVERA) 150 MG/ML injection Inject 1 mL (150 mg total) into the muscle every 3 (three) months. 12/26/16  Yes Brock Bad, MD  Multiple Vitamins-Iron (MULTIVITAMINS WITH IRON) TABS tablet  Take 1 tablet by mouth daily.   Yes [provider]  oxyCODONE-acetaminophen (PERCOCET/ROXICET) 5-325 MG tablet Take 1 tablet by mouth every 4 (four) hours as needed for severe pain.   Yes [provider]    Family History Family History  Problem Relation Age of Onset  . Heart disease Mother   . Cancer Maternal Grandmother        breast  . Cancer Paternal Grandmother        breast    Social History Social History  Substance Use Topics  . Smoking status: Former Smoker    Years: 3.00    Types: Cigars  . Smokeless tobacco:  Never Used     Comment: 1-2 cig./day  . Alcohol use 0.0 oz/week     Comment: occ     Allergies   Contrast media [iodinated diagnostic agents]; Latuda [lurasidone hcl]; Sulfa antibiotics; Bee venom; Flagyl [metronidazole]; Naproxen; Tramadol; and Toradol [ketorolac tromethamine]   Review of Systems Review of Systems  Constitutional: Negative for appetite change, diaphoresis, fatigue, fever and unexpected weight change.  HENT: Negative for mouth sores.   Eyes: Negative for visual disturbance.  Respiratory: Negative for cough, chest tightness, shortness of breath and wheezing.   Cardiovascular: Negative for chest pain.  Gastrointestinal: Positive for abdominal pain and nausea. Negative for constipation, diarrhea and vomiting.  Endocrine: Negative for polydipsia, polyphagia and polyuria.  Genitourinary: Negative for dysuria, frequency, hematuria and urgency.  Musculoskeletal: Negative for back pain and neck stiffness.  Skin: Negative for rash.  Allergic/Immunologic: Negative for immunocompromised state.  Neurological: Negative for syncope, light-headedness and headaches.  Hematological: Does not bruise/bleed easily.  Psychiatric/Behavioral: Negative for sleep disturbance. The patient is not nervous/anxious.   All other systems reviewed and are negative.    Physical Exam Updated Vital Signs BP 121/84 (BP Location: Left Arm)   Pulse 68   Temp 98 F (36.7 C) (Oral)   Resp 16   Ht 5\' 7"  (1.702 m)   Wt 83 kg (183 lb)   SpO2 100%   BMI 28.66 kg/m   Physical Exam  Constitutional: She appears well-developed and well-nourished. No distress.  Awake, alert, nontoxic appearance  HENT:  Head: Normocephalic and atraumatic.  Mouth/Throat: Oropharynx is clear and moist. No oropharyngeal exudate.  Eyes: Conjunctivae are normal. No scleral icterus.  Neck: Normal range of motion. Neck supple.  Cardiovascular: Normal rate, regular rhythm, normal heart sounds and intact distal pulses.     No murmur heard. Pulmonary/Chest: Effort normal and breath sounds normal. No respiratory distress. She has no wheezes.  Equal chest expansion  Abdominal: Soft. Bowel sounds are normal. She exhibits no mass. There is tenderness in the left lower quadrant. There is no rebound and no guarding. Hernia confirmed negative in the right inguinal area and confirmed negative in the left inguinal area.  Genitourinary: Uterus normal. No labial fusion. There is no rash, tenderness or lesion on the right labia. There is no rash, tenderness or lesion on the left labia. Uterus is not deviated, not enlarged, not fixed and not tender. Cervix exhibits no motion tenderness, no discharge and no friability. Right adnexum displays no mass, no tenderness and no fullness. Left adnexum displays tenderness. Left adnexum displays no mass and no fullness. No erythema, tenderness or bleeding in the vagina. No foreign body in the vagina. No signs of injury around the vagina. Vaginal discharge (Thick, white) found.  Musculoskeletal: Normal range of motion. She exhibits no edema.  Lymphadenopathy:  Right: No inguinal adenopathy present.       Left: No inguinal adenopathy present.  Neurological: She is alert.  Speech is clear and goal oriented Moves extremities without ataxia  Skin: Skin is warm and dry. She is not diaphoretic. No erythema.  Psychiatric: She has a normal mood and affect.  Nursing note and vitals reviewed.    ED Treatments / Results  Labs (all labs ordered are listed, but only abnormal results are displayed) Labs Reviewed  WET PREP, GENITAL - Abnormal; Notable for the following:       Result Value   Clue Cells Wet Prep HPF POC PRESENT (*)    WBC, Wet Prep HPF POC FEW (*)    All other components within normal limits  COMPREHENSIVE METABOLIC PANEL - Abnormal; Notable for the following:    Glucose, Bld 101 (*)    Calcium 8.7 (*)    Total Protein 6.2 (*)    Total Bilirubin <0.1 (*)    All other  components within normal limits  CBC - Abnormal; Notable for the following:    MCV 77.9 (*)    All other components within normal limits  URINALYSIS, ROUTINE W REFLEX MICROSCOPIC - Abnormal; Notable for the following:    APPearance HAZY (*)    All other components within normal limits  LIPASE, BLOOD  I-STAT BETA HCG BLOOD, ED ([facility], [facility], [facility] ONLY)  GC/CHLAMYDIA PROBE AMP ([place]) NOT AT [facility]    Radiology US Transvaginal Non-ob  Result Date: 05/27/2017 CLINICAL DATA:  31 year old female with pelvic pain for 1 week. EXAM: TRANSABDOMINAL AND TRANSVAGINAL ULTRASOUND OF PELVIS DOPPLER ULTRASOUND OF OVARIES TECHNIQUE: Both transabdominal and transvaginal ultrasound examinations of the pelvis were performed. Transabdominal technique was performed for global imaging of the pelvis including uterus, ovaries, adnexal regions, and pelvic cul-de-sac. It was necessary to proceed with endovaginal exam following the transabdominal exam to visualize the endometrium and ovaries. Color and duplex Doppler ultrasound was utilized to evaluate blood flow to the ovaries. COMPARISON:  Pelvic ultrasound dated 04/30/2017 FINDINGS: Uterus Measurements: 9.2 x 5.3 x 5.2 cm. No fibroids or other mass visualized. Endometrium Thickness: 9 mm.  No focal abnormality visualized. Right ovary Measurements: 2.4 x 1.9 x 1.7 cm. Normal appearance/no adnexal mass. Left ovary Measurements: 3.5 x 2.3 x 2.4 cm. Normal appearance/no adnexal mass. A 1.6 x 1.4 x 1.5 cm dominant follicle/cyst. Pulsed Doppler evaluation of both ovaries demonstrates normal low-resistance arterial and venous waveforms. Other findings Mildly dilated pelvic vasculature noted, right greater left. Correlation with clinical exam is recommended to exclude pelvic congestion syndrome. IMPRESSION: 1. Unremarkable uterus and ovaries.  Bilateral ovarian Doppler flow. 2. Mildly dilated pelvic vasculature. Electronically Signed   By: Elgie Collard M.D.   On: 05/27/2017 04:21    US Pelvis Complete  Result Date: 05/27/2017 CLINICAL DATA:  31 year old female with pelvic pain for 1 week. EXAM: TRANSABDOMINAL AND TRANSVAGINAL ULTRASOUND OF PELVIS DOPPLER ULTRASOUND OF OVARIES TECHNIQUE: Both transabdominal and transvaginal ultrasound examinations of the pelvis were performed. Transabdominal technique was performed for global imaging of the pelvis including uterus, ovaries, adnexal regions, and pelvic cul-de-sac. It was necessary to proceed with endovaginal exam following the transabdominal exam to visualize the endometrium and ovaries. Color and duplex Doppler ultrasound was utilized to evaluate blood flow to the ovaries. COMPARISON:  Pelvic ultrasound dated 04/30/2017 FINDINGS: Uterus Measurements: 9.2 x 5.3 x 5.2 cm. No fibroids or other mass visualized. Endometrium Thickness: 9 mm.  No focal abnormality visualized. Right ovary Measurements: 2.4  x 1.9 x 1.7 cm. Normal appearance/no adnexal mass. Left ovary Measurements: 3.5 x 2.3 x 2.4 cm. Normal appearance/no adnexal mass. A 1.6 x 1.4 x 1.5 cm dominant follicle/cyst. Pulsed Doppler evaluation of both ovaries demonstrates normal low-resistance arterial and venous waveforms. Other findings Mildly dilated pelvic vasculature noted, right greater left. Correlation with clinical exam is recommended to exclude pelvic congestion syndrome. IMPRESSION: 1. Unremarkable uterus and ovaries.  Bilateral ovarian Doppler flow. 2. Mildly dilated pelvic vasculature. Electronically Signed   By: Elgie CollardArash  Radparvar M.D.   On: 05/27/2017 04:21   [name] Art/ven Flow Abd Pelv Doppler  Result Date: 05/27/2017 CLINICAL DATA:  31 year old female with pelvic pain for 1 week. EXAM: TRANSABDOMINAL AND TRANSVAGINAL ULTRASOUND OF PELVIS DOPPLER ULTRASOUND OF OVARIES TECHNIQUE: Both transabdominal and transvaginal ultrasound examinations of the pelvis were performed. Transabdominal technique was performed for global imaging of the pelvis including uterus, ovaries,  adnexal regions, and pelvic cul-de-sac. It was necessary to proceed with endovaginal exam following the transabdominal exam to visualize the endometrium and ovaries. Color and duplex Doppler ultrasound was utilized to evaluate blood flow to the ovaries. COMPARISON:  Pelvic ultrasound dated 04/30/2017 FINDINGS: Uterus Measurements: 9.2 x 5.3 x 5.2 cm. No fibroids or other mass visualized. Endometrium Thickness: 9 mm.  No focal abnormality visualized. Right ovary Measurements: 2.4 x 1.9 x 1.7 cm. Normal appearance/no adnexal mass. Left ovary Measurements: 3.5 x 2.3 x 2.4 cm. Normal appearance/no adnexal mass. A 1.6 x 1.4 x 1.5 cm dominant follicle/cyst. Pulsed Doppler evaluation of both ovaries demonstrates normal low-resistance arterial and venous waveforms. Other findings Mildly dilated pelvic vasculature noted, right greater left. Correlation with clinical exam is recommended to exclude pelvic congestion syndrome. IMPRESSION: 1. Unremarkable uterus and ovaries.  Bilateral ovarian Doppler flow. 2. Mildly dilated pelvic vasculature. Electronically Signed   By: Elgie CollardArash  Radparvar M.D.   On: 05/27/2017 04:21    Procedures Procedures (including critical care time)  Medications Ordered in ED Medications  morphine 4 MG/ML injection 4 mg (4 mg Intravenous Given 05/27/17 0136)  ondansetron (ZOFRAN) injection 4 mg (4 mg Intravenous Given 05/27/17 0135)  sodium chloride 0.9 % bolus 1,000 mL (0 mLs Intravenous Stopped 05/27/17 0313)  diphenhydrAMINE (BENADRYL) injection 25 mg (25 mg Intravenous Given 05/27/17 0212)  fentaNYL (SUBLIMAZE) injection 50 mcg (50 mcg Intravenous Given 05/27/17 0332)     Initial Impression / Assessment and Plan / ED Course  I have reviewed the triage vital signs and the nursing notes.  Pertinent labs & imaging results that were available during my care of the patient were reviewed by me and considered in my medical decision making (see chart for details).  Clinical Course as of May 28 627  Wynelle LinkSun May 27, 2017  0522 Pt eloped prior to my ability to discuss her findings.    [HM]    Clinical Course User Index [HM] Deadrick Stidd, Dahlia ClientHannah, [place] JerseyPA-C    Patient presents to the emergency room with composite left lower quadrant abdominal pain. Abdomen is soft without rebound but she does have left lower quadrant focal pain. Pelvic exam localizes pain into the pelvis and away from the abdomen. No masses palpated on the ovaries. Labs reassuring. No cervical motion tenderness on exam. Ultrasound shows no evidence of ovarian torsion or PID. No evidence of TOA.  Patient eloped before I could discuss the results of her scans with her.  Final Clinical Impressions(s) / ED Diagnoses   Final diagnoses:  Left lower quadrant pain    New Prescriptions Discharge  Medication List as of 05/27/2017  5:25 AM       Ludwig Tugwell, Dahlia Client, PA-C 05/27/17 0630    Molpus, Jonny Ruiz, MD 05/27/17 [id number]

## 2017-05-28 ENCOUNTER — Encounter (HOSPITAL_COMMUNITY): Payer: Self-pay | Admitting: *Deleted

## 2017-05-28 ENCOUNTER — Inpatient Hospital Stay (HOSPITAL_COMMUNITY)
Admission: AD | Admit: 2017-05-28 | Discharge: 2017-05-28 | Disposition: A | Discharge status: Home / Self Care | Payer: Medicaid Other | Source: Ambulatory Visit | Attending: Obstetrics & Gynecology | Admitting: Obstetrics & Gynecology

## 2017-05-28 DIAGNOSIS — Z87891 Personal history of nicotine dependence: Secondary | ICD-10-CM | POA: Insufficient documentation

## 2017-05-28 DIAGNOSIS — N644 Mastodynia: Secondary | ICD-10-CM | POA: Diagnosis not present

## 2017-05-28 DIAGNOSIS — Z8614 Personal history of Methicillin resistant Staphylococcus aureus infection: Secondary | ICD-10-CM | POA: Diagnosis not present

## 2017-05-28 DIAGNOSIS — R1032 Left lower quadrant pain: Secondary | ICD-10-CM | POA: Diagnosis present

## 2017-05-28 LAB — URINALYSIS, ROUTINE W REFLEX MICROSCOPIC
Bilirubin Urine: NEGATIVE
Glucose, UA: NEGATIVE mg/dL
Hgb urine dipstick: NEGATIVE
Ketones, ur: NEGATIVE mg/dL
Leukocytes, UA: NEGATIVE
Nitrite: NEGATIVE
Protein, ur: NEGATIVE mg/dL
Specific Gravity, Urine: 1.03 (ref 1.005–1.030)
pH: 5 (ref 5.0–8.0)

## 2017-05-28 LAB — GC/CHLAMYDIA PROBE AMP (~~LOC~~) NOT AT ARMC
Chlamydia: NEGATIVE
Neisseria Gonorrhea: NEGATIVE

## 2017-05-28 LAB — POCT PREGNANCY, URINE: Preg Test, Ur: NEGATIVE

## 2017-05-28 MED ORDER — IBUPROFEN 800 MG PO TABS: 800.0000 mg | ORAL_TABLET | Freq: Three times a day (TID) | ORAL | 1 refills | Status: DC | PRN

## 2017-05-28 MED ORDER — IBUPROFEN 800 MG PO TABS
800.0000 mg | ORAL_TABLET | Freq: Once | ORAL | Status: AC
  Administered 2017-05-28: 800 mg via ORAL
  Filled 2017-05-28: qty 1

## 2017-05-28 NOTE — Discharge Instructions (Signed)

## 2017-05-28 NOTE — MAU Note (Addendum)
PT SAYS SHE HAS HAD DEPO- LAST INJ WAS MAY-   FROM DR HARPER .   HX-   4 VAG DEL  THEN BTL- THEN ECTOPIC PREG - ON RIGHT - REMOVED PREG.     NO HPT   LAST SEX-    SAT       SHE WENT  TO [facility]  FOR ABD PAIN ON LEFT AND NAUSEA  AND DIZZY  ON SAT -     UPT NEG AND DID U/S-   THINKS  SAW CYST .   TOLD  IF GETS WORSE  - SEE DR HARPER- MADE AN APPOINTMENT  FOR  WED.         PAIN BECAME WORSE  TODAY AND NOW BREAST TENDER - [facility] DID CX.      WAS GIVEN HYDROCODONE  AND  PHENERGAN-         TOOK   BOTH AT 6 PM-  NO RELIEF    WENT  TO [facility]  TODAY

## 2017-05-28 NOTE — MAU Provider Note (Signed)
Chief Complaint: Abdominal Pain   First Provider Initiated Contact with Patient 05/28/17 2200     SUBJECTIVE HPI: Leah Barry is a 31 y.o. [phone] non-pregnant female who presents to [facility] Admissions reporting breast tenderness today and LLQ pain x a few day. Was evaluated at ED and found to have a follicle. Nml dopplers, GC/Chlmaydia cultures yesterday. States pain worsened and is not controlled w/ Hydrocodone, but then states that her primary concern is her breast tenderness. Pt is also nervous because she had an ectopic pregnancy after BTL.   Location: Breasts Quality: tender Severity: 5/10 on pain scale Duration: <24 hours Context: None Timing: constant  Modifying factors: No relief w/ Hydrocodone Associated signs and symptoms: Neg for mass, fever, chills, nipple discharge   Past Medical History:  Diagnosis Date  . Anxiety   . Apnea, sleep    no CPAP use, states lost machine during move to [place]  . Bipolar 1 disorder (HCC)   . Bipolar disorder (HCC)    no current med.  . Depression   . History of MRSA infection 2010  . Irritable bowel syndrome (IBS)    no current med.  . Pelvic inflammatory disease   . Scalp laceration    staples to be removed 03/13/2014  . Ulnar fracture 03/09/2014   left   OB History  Gravida Para Term Preterm AB Living  5 4 4   1 4   SAB TAB Ectopic Multiple Live Births      1   4    # Outcome Date GA Lbr Len/2nd Weight Sex Delivery Anes PTL Lv  5 Ectopic           4 Term      Vag-Spont   LIV  3 Term      Vag-Spont   LIV  2 Term      Vag-Spont   LIV  1 Term      Vag-Spont   LIV     Past Surgical History:  Procedure Laterality Date  . CYST EXCISION     middle of back  . ORIF ULNAR FRACTURE Left 03/16/2014   Procedure: OPEN REDUCTION INTERNAL FIXATION (ORIF) LEFT ULNAR FRACTURE;  Surgeon: Marlowe Shores, MD;  Location: [facility];  Service: Orthopedics;  Laterality: Left;  left  . OVARIAN CYST REMOVAL    . THYROID CYST  EXCISION     nodule exc.  . TUBAL LIGATION     Social History   Social History  . Marital status: Single    Spouse name: N/A  . Number of children: N/A  . Years of education: N/A   Occupational History  . Not on file.   Social History Main Topics  . Smoking status: Former Smoker    Years: 3.00    Types: Cigars    Quit date: 04/28/2017  . Smokeless tobacco: Never Used     Comment: 1-2 cig./day  . Alcohol use No     Comment: occ  . Drug use: No  . Sexual activity: Yes    Partners: Male    Birth control/ protection: Surgical     Comment: Last encounter 2 weeks ago   Other Topics Concern  . Not on file   Social History Narrative  . No narrative on file   Family History  Problem Relation Age of Onset  . Heart disease Mother   . Cancer Maternal Grandmother        breast  . Cancer Paternal Grandmother  breast   No current facility-administered medications on file prior to encounter.    Current Outpatient Prescriptions on File Prior to Encounter  Medication Sig Dispense Refill  . ALPRAZolam (XANAX) 0.5 MG tablet Take 0.5 mg by mouth at bedtime as needed for anxiety.    [name]. HYDROcodone-acetaminophen (NORCO/VICODIN) 5-325 MG tablet Take 1-2 tablets by mouth every 6 (six) hours as needed for moderate pain. 10 tablet 0  . Multiple Vitamins-Iron (MULTIVITAMINS WITH IRON) TABS tablet Take 1 tablet by mouth daily.    . promethazine (PHENERGAN) 25 MG tablet Take 1 tablet (25 mg total) by mouth every 6 (six) hours as needed for nausea or vomiting. 10 tablet 1   Allergies  Allergen Reactions  . Contrast Media [Iodinated Diagnostic Agents] Itching    Pt developed itching after contrast administration; patient had also received Fentanyl immediately prior to transport to [place]. The itching was without any rash, it was concentrated in the face/nose, and relieved with Benadryl. Possible that itching a side effect of pain medication rather than contrast.  . Kasandra KnudsenLatuda [Lurasidone Hcl]  Palpitations and Other (See Comments)    Hot flashes, tremors  . Sulfa Antibiotics Swelling    SWELLING OF EYES  . Bee Venom Swelling    "Bees make my eyes swell."  . Flagyl [Metronidazole] Nausea And Vomiting    PO form causes reaction Topically is fine  . Naproxen Other (See Comments)    NOSE BLEED  . Tramadol Itching and Nausea And Vomiting  . Toradol [Ketorolac Tromethamine] Itching    Able to take Ibuprofen    I have reviewed patient's Past Medical Hx, Surgical Hx, Family Hx, Social Hx, medications and allergies.   Review of Systems  Constitutional: Negative for chills and fever.  Gastrointestinal: Positive for abdominal pain. Negative for constipation, diarrhea, nausea and vomiting.  Genitourinary: Negative for vaginal bleeding and vaginal discharge.  Pos Breast tenderness  OBJECTIVE Patient Vitals for the past 24 hrs:  BP Temp Temp src Pulse Resp Height Weight  05/28/17 2256 120/81 - - 78 16 - -  05/28/17 2047 116/69 98.7 F (37.1 C) Oral 88 20 5\' 7"  (1.702 m) 193 lb 4 oz (87.7 kg)   Constitutional: Well-developed, well-nourished, obese female in no acute distress.  Cardiovascular: normal rate Respiratory: normal rate and effort.  Breasts: Moderate generalized tenderness bilat. No palpable masses, dimpling of skin, lymphadenopathy, nipple drainage.  GI: Abd soft, non-tender.  Neurologic: Alert and oriented x 4.  GU: Declined  LAB RESULTS Results for orders placed or performed during the hospital encounter of 05/28/17 (from the past 24 hour(s))  Urinalysis, Routine w reflex microscopic     Status: None   Collection Time: 05/28/17  8:49 PM  Result Value Ref Range   Color, Urine YELLOW YELLOW   APPearance CLEAR CLEAR   Specific Gravity, Urine 1.030 1.005 - 1.030   pH 5.0 5.0 - 8.0   Glucose, UA NEGATIVE NEGATIVE mg/dL   Hgb urine dipstick NEGATIVE NEGATIVE   Bilirubin Urine NEGATIVE NEGATIVE   Ketones, ur NEGATIVE NEGATIVE mg/dL   Protein, ur NEGATIVE NEGATIVE  mg/dL   Nitrite NEGATIVE NEGATIVE   Leukocytes, UA NEGATIVE NEGATIVE  Pregnancy, urine POC     Status: None   Collection Time: 05/28/17 10:16 PM  Result Value Ref Range   Preg Test, Ur NEGATIVE NEGATIVE    IMAGING [name] Transvaginal Non-ob  Result Date: 05/27/2017 CLINICAL DATA:  31 year old female with pelvic pain for 1 week. EXAM: TRANSABDOMINAL AND TRANSVAGINAL ULTRASOUND OF  PELVIS DOPPLER ULTRASOUND OF OVARIES TECHNIQUE: Both transabdominal and transvaginal ultrasound examinations of the pelvis were performed. Transabdominal technique was performed for global imaging of the pelvis including uterus, ovaries, adnexal regions, and pelvic cul-de-sac. It was necessary to proceed with endovaginal exam following the transabdominal exam to visualize the endometrium and ovaries. Color and duplex Doppler ultrasound was utilized to evaluate blood flow to the ovaries. COMPARISON:  Pelvic ultrasound dated 04/30/2017 FINDINGS: Uterus Measurements: 9.2 x 5.3 x 5.2 cm. No fibroids or other mass visualized. Endometrium Thickness: 9 mm.  No focal abnormality visualized. Right ovary Measurements: 2.4 x 1.9 x 1.7 cm. Normal appearance/no adnexal mass. Left ovary Measurements: 3.5 x 2.3 x 2.4 cm. Normal appearance/no adnexal mass. A 1.6 x 1.4 x 1.5 cm dominant follicle/cyst. Pulsed Doppler evaluation of both ovaries demonstrates normal low-resistance arterial and venous waveforms. Other findings Mildly dilated pelvic vasculature noted, right greater left. Correlation with clinical exam is recommended to exclude pelvic congestion syndrome. IMPRESSION: 1. Unremarkable uterus and ovaries.  Bilateral ovarian Doppler flow. 2. Mildly dilated pelvic vasculature. Electronically Signed   By: Elgie Collard M.D.   On: 05/27/2017 04:21   US Transvaginal Non-ob  Result Date: 04/30/2017 CLINICAL DATA:  Left pelvic and back pain x2 days, history of ectopic pregnancy EXAM: TRANSABDOMINAL AND TRANSVAGINAL ULTRASOUND OF PELVIS DOPPLER  ULTRASOUND OF OVARIES TECHNIQUE: Both transabdominal and transvaginal ultrasound examinations of the pelvis were performed. Transabdominal technique was performed for global imaging of the pelvis including uterus, ovaries, adnexal regions, and pelvic cul-de-sac. It was necessary to proceed with endovaginal exam following the transabdominal exam to visualize the endometrium. Color and duplex Doppler ultrasound was utilized to evaluate blood flow to the ovaries. COMPARISON:  03/13/2017 FINDINGS: Uterus Measurements: 9.9 x 4.5 x 5.4 cm. No fibroids or other mass visualized. Endometrium Thickness: 4 mm.  No focal abnormality visualized. Right ovary Measurements: 2.9 x 2.1 x 1.9 cm. Normal appearance/no adnexal mass. Left ovary Measurements: 3.2 x 1.7 x 2.0 cm. Normal appearance/no adnexal mass. Pulsed Doppler evaluation of both ovaries demonstrates normal low-resistance arterial and venous waveforms. Other findings No abnormal free fluid. IMPRESSION: Negative pelvic ultrasound. No evidence of ovarian torsion. Electronically Signed   By: Charline Bills M.D.   On: 04/30/2017 13:33   US Pelvis Complete  Result Date: 05/27/2017 CLINICAL DATA:  31 year old female with pelvic pain for 1 week. EXAM: TRANSABDOMINAL AND TRANSVAGINAL ULTRASOUND OF PELVIS DOPPLER ULTRASOUND OF OVARIES TECHNIQUE: Both transabdominal and transvaginal ultrasound examinations of the pelvis were performed. Transabdominal technique was performed for global imaging of the pelvis including uterus, ovaries, adnexal regions, and pelvic cul-de-sac. It was necessary to proceed with endovaginal exam following the transabdominal exam to visualize the endometrium and ovaries. Color and duplex Doppler ultrasound was utilized to evaluate blood flow to the ovaries. COMPARISON:  Pelvic ultrasound dated 04/30/2017 FINDINGS: Uterus Measurements: 9.2 x 5.3 x 5.2 cm. No fibroids or other mass visualized. Endometrium Thickness: 9 mm.  No focal abnormality  visualized. Right ovary Measurements: 2.4 x 1.9 x 1.7 cm. Normal appearance/no adnexal mass. Left ovary Measurements: 3.5 x 2.3 x 2.4 cm. Normal appearance/no adnexal mass. A 1.6 x 1.4 x 1.5 cm dominant follicle/cyst. Pulsed Doppler evaluation of both ovaries demonstrates normal low-resistance arterial and venous waveforms. Other findings Mildly dilated pelvic vasculature noted, right greater left. Correlation with clinical exam is recommended to exclude pelvic congestion syndrome. IMPRESSION: 1. Unremarkable uterus and ovaries.  Bilateral ovarian Doppler flow. 2. Mildly dilated pelvic vasculature. Electronically Signed   By:  Elgie Collard M.D.   On: 05/27/2017 04:21   US Pelvis Complete  Result Date: 04/30/2017 CLINICAL DATA:  Left pelvic and back pain x2 days, history of ectopic pregnancy EXAM: TRANSABDOMINAL AND TRANSVAGINAL ULTRASOUND OF PELVIS DOPPLER ULTRASOUND OF OVARIES TECHNIQUE: Both transabdominal and transvaginal ultrasound examinations of the pelvis were performed. Transabdominal technique was performed for global imaging of the pelvis including uterus, ovaries, adnexal regions, and pelvic cul-de-sac. It was necessary to proceed with endovaginal exam following the transabdominal exam to visualize the endometrium. Color and duplex Doppler ultrasound was utilized to evaluate blood flow to the ovaries. COMPARISON:  03/13/2017 FINDINGS: Uterus Measurements: 9.9 x 4.5 x 5.4 cm. No fibroids or other mass visualized. Endometrium Thickness: 4 mm.  No focal abnormality visualized. Right ovary Measurements: 2.9 x 2.1 x 1.9 cm. Normal appearance/no adnexal mass. Left ovary Measurements: 3.2 x 1.7 x 2.0 cm. Normal appearance/no adnexal mass. Pulsed Doppler evaluation of both ovaries demonstrates normal low-resistance arterial and venous waveforms. Other findings No abnormal free fluid. IMPRESSION: Negative pelvic ultrasound. No evidence of ovarian torsion. Electronically Signed   By: Charline Bills M.D.    On: 04/30/2017 13:33   [place] Art/ven Flow Abd Pelv Doppler  Result Date: 05/27/2017 CLINICAL DATA:  31 year old female with pelvic pain for 1 week. EXAM: TRANSABDOMINAL AND TRANSVAGINAL ULTRASOUND OF PELVIS DOPPLER ULTRASOUND OF OVARIES TECHNIQUE: Both transabdominal and transvaginal ultrasound examinations of the pelvis were performed. Transabdominal technique was performed for global imaging of the pelvis including uterus, ovaries, adnexal regions, and pelvic cul-de-sac. It was necessary to proceed with endovaginal exam following the transabdominal exam to visualize the endometrium and ovaries. Color and duplex Doppler ultrasound was utilized to evaluate blood flow to the ovaries. COMPARISON:  Pelvic ultrasound dated 04/30/2017 FINDINGS: Uterus Measurements: 9.2 x 5.3 x 5.2 cm. No fibroids or other mass visualized. Endometrium Thickness: 9 mm.  No focal abnormality visualized. Right ovary Measurements: 2.4 x 1.9 x 1.7 cm. Normal appearance/no adnexal mass. Left ovary Measurements: 3.5 x 2.3 x 2.4 cm. Normal appearance/no adnexal mass. A 1.6 x 1.4 x 1.5 cm dominant follicle/cyst. Pulsed Doppler evaluation of both ovaries demonstrates normal low-resistance arterial and venous waveforms. Other findings Mildly dilated pelvic vasculature noted, right greater left. Correlation with clinical exam is recommended to exclude pelvic congestion syndrome. IMPRESSION: 1. Unremarkable uterus and ovaries.  Bilateral ovarian Doppler flow. 2. Mildly dilated pelvic vasculature. Electronically Signed   By: Elgie Collard M.D.   On: 05/27/2017 04:21   [place] Art/ven Flow Abd Pelv Doppler  Result Date: 04/30/2017 CLINICAL DATA:  Left pelvic and back pain x2 days, history of ectopic pregnancy EXAM: TRANSABDOMINAL AND TRANSVAGINAL ULTRASOUND OF PELVIS DOPPLER ULTRASOUND OF OVARIES TECHNIQUE: Both transabdominal and transvaginal ultrasound examinations of the pelvis were performed. Transabdominal technique was performed for global  imaging of the pelvis including uterus, ovaries, adnexal regions, and pelvic cul-de-sac. It was necessary to proceed with endovaginal exam following the transabdominal exam to visualize the endometrium. Color and duplex Doppler ultrasound was utilized to evaluate blood flow to the ovaries. COMPARISON:  03/13/2017 FINDINGS: Uterus Measurements: 9.9 x 4.5 x 5.4 cm. No fibroids or other mass visualized. Endometrium Thickness: 4 mm.  No focal abnormality visualized. Right ovary Measurements: 2.9 x 2.1 x 1.9 cm. Normal appearance/no adnexal mass. Left ovary Measurements: 3.2 x 1.7 x 2.0 cm. Normal appearance/no adnexal mass. Pulsed Doppler evaluation of both ovaries demonstrates normal low-resistance arterial and venous waveforms. Other findings No abnormal free fluid. IMPRESSION: Negative pelvic ultrasound. No evidence  of ovarian torsion. Electronically Signed   By: Charline Bills M.D.   On: 04/30/2017 13:33    MAU COURSE Orders Placed This Encounter  Procedures  . Urinalysis, Routine w reflex microscopic  . Pregnancy, urine  . Pregnancy, urine POC  . Discharge patient   Meds ordered this encounter  Medications  . ibuprofen (ADVIL,MOTRIN) tablet 800 mg  . ibuprofen (ADVIL,MOTRIN) 800 MG tablet    Sig: Take 1 tablet (800 mg total) by mouth every 8 (eight) hours as needed for moderate pain.    Dispense:  30 tablet    Refill:  1    Order Specific Question:   Supervising Provider    Answer:   Adam Phenix [[id number]]   Pain resolved w/ Ibuprofen.   MDM - Bialt, acute mastalgia w/ onset soon after ovulation C/W hormonal breast tenderness  From menstrual cycle. Low suspicion for emergent condition or Br CA although pt in concerned due to Hx Br CA in extended family (no first degree relatives, or premenopausal cases.)  ASSESSMENT 1. Breast tenderness in female     PLAN Discharge home in stable condition. Rx Ibuprofen Discuss Breast tenderness w/ Dr. Clearance Coots and your concerns about FH Br CA.   Follow-up Information    Brock Bad, MD Follow up on 05/30/2017.   Specialty:  Obstetrics and Gynecology Why:  as scheduled Contact information: [address] [place] [address] [phone]          Allergies as of 05/28/2017      Reactions   Contrast Media [iodinated Diagnostic Agents] Itching   Pt developed itching after contrast administration; patient had also received Fentanyl immediately prior to transport to [place]. The itching was without any rash, it was concentrated in the face/nose, and relieved with Benadryl. Possible that itching a side effect of pain medication rather than contrast.   Latuda [lurasidone Hcl] Palpitations, Other (See Comments)   Hot flashes, tremors   Sulfa Antibiotics Swelling   SWELLING OF EYES   Bee Venom Swelling   "Bees make my eyes swell."   Flagyl [metronidazole] Nausea And Vomiting   PO form causes reaction Topically is fine   Naproxen Other (See Comments)   NOSE BLEED   Tramadol Itching, Nausea And Vomiting   Toradol [ketorolac Tromethamine] Itching   Able to take Ibuprofen      Medication List    TAKE these medications   ALPRAZolam 0.5 MG tablet Commonly known as:  XANAX Take 0.5 mg by mouth at bedtime as needed for anxiety.   HYDROcodone-acetaminophen 5-325 MG tablet Commonly known as:  NORCO/VICODIN Take 1-2 tablets by mouth every 6 (six) hours as needed for moderate pain.   ibuprofen 800 MG tablet Commonly known as:  ADVIL,MOTRIN Take 1 tablet (800 mg total) by mouth every 8 (eight) hours as needed for moderate pain. What changed:  medication strength  how much to take  when to take this   multivitamins with iron Tabs tablet Take 1 tablet by mouth daily.   promethazine 25 MG tablet Commonly known as:  PHENERGAN Take 1 tablet (25 mg total) by mouth every 6 (six) hours as needed for nausea or vomiting.        Katrinka Blazing, [place], [place] 05/28/2017  10:58 PM

## 2017-05-30 ENCOUNTER — Encounter: Payer: Self-pay | Admitting: Obstetrics

## 2017-05-30 ENCOUNTER — Ambulatory Visit (INDEPENDENT_AMBULATORY_CARE_PROVIDER_SITE_OTHER): Payer: Medicaid Other | Admitting: Obstetrics

## 2017-05-30 VITALS — Wt 194.7 lb

## 2017-05-30 DIAGNOSIS — N644 Mastodynia: Secondary | ICD-10-CM | POA: Diagnosis not present

## 2017-05-30 DIAGNOSIS — N9489 Other specified conditions associated with female genital organs and menstrual cycle: Secondary | ICD-10-CM

## 2017-05-30 DIAGNOSIS — N760 Acute vaginitis: Secondary | ICD-10-CM

## 2017-05-30 DIAGNOSIS — F3170 Bipolar disorder, currently in remission, most recent episode unspecified: Secondary | ICD-10-CM

## 2017-05-30 DIAGNOSIS — R4589 Other symptoms and signs involving emotional state: Secondary | ICD-10-CM

## 2017-05-30 DIAGNOSIS — B9689 Other specified bacterial agents as the cause of diseases classified elsewhere: Secondary | ICD-10-CM

## 2017-05-30 DIAGNOSIS — Z3202 Encounter for pregnancy test, result negative: Secondary | ICD-10-CM | POA: Diagnosis not present

## 2017-05-30 DIAGNOSIS — F418 Other specified anxiety disorders: Secondary | ICD-10-CM

## 2017-05-30 DIAGNOSIS — R102 Pelvic and perineal pain unspecified side: Secondary | ICD-10-CM

## 2017-05-30 LAB — POCT URINALYSIS DIPSTICK
Bilirubin, UA: NEGATIVE
Glucose, UA: NEGATIVE
Ketones, UA: NEGATIVE
Leukocytes, UA: NEGATIVE
Nitrite, UA: NEGATIVE
Protein, UA: NEGATIVE
Spec Grav, UA: 1.015 (ref 1.010–1.025)
Urobilinogen, UA: 0.2 E.U./dL
pH, UA: 6 (ref 5.0–8.0)

## 2017-05-30 LAB — POCT URINE PREGNANCY: Preg Test, Ur: NEGATIVE

## 2017-05-30 MED ORDER — METRONIDAZOLE 0.75 % VA GEL: 1.0000 | Freq: Two times a day (BID) | VAGINAL | 6 refills | Status: DC

## 2017-05-30 MED ORDER — GABAPENTIN 300 MG PO CAPS: 300.0000 mg | ORAL_CAPSULE | Freq: Three times a day (TID) | ORAL | 5 refills | Status: DC

## 2017-05-30 MED ORDER — IBUPROFEN 800 MG PO TABS: 800.0000 mg | ORAL_TABLET | Freq: Three times a day (TID) | ORAL | 11 refills | Status: DC | PRN

## 2017-05-30 NOTE — Progress Notes (Signed)
Pt presents f/u from hospital for cyst. Pt c/o recurrent BV positive wet prep 7/15 at the hospital but wasn't prescribed any meds and c/o swollen breasts. UPT neg 7/15 at hospital. BP machine malfunctioned - BP checked twice pt didn't allow me to recheck again with manual cuff.

## 2017-05-30 NOTE — Progress Notes (Signed)
Subjective:        Leah Barry is a 31 y.o. female here for a routine exam.  Current complaints: Breast pain over the past week.  Denies palpable masses.  Chronic pelvic pain.  Recurrent BV.   Personal health questionnaire:  Is patient Ashkenazi Jewish, have a family history of breast and/or ovarian cancer: no Is there a family history of uterine cancer diagnosed at age < 4, gastrointestinal cancer, urinary tract cancer, family member who is a Personnel officer syndrome-associated carrier: no Is the patient overweight and hypertensive, family history of diabetes, personal history of gestational diabetes, preeclampsia or PCOS: no Is patient over 56, have PCOS,  family history of premature CHD under age 11, diabetes, smoke, have hypertension or peripheral artery disease:  no At any time, has a partner hit, kicked or otherwise hurt or frightened you?: no Over the past 2 weeks, have you felt down, depressed or hopeless?: no Over the past 2 weeks, have you felt little interest or pleasure in doing things?:no   Gynecologic History No LMP recorded. Patient is not currently having periods (Reason: Irregular Periods). Contraception: Depo-Provera injections Last Pap: 2018. Results were: normal Last mammogram: n/a. Results were: n/a  Obstetric History OB History  Gravida Para Term Preterm AB Living  5 4 4   1 4   SAB TAB Ectopic Multiple Live Births      1   4    # Outcome Date GA Lbr Len/2nd Weight Sex Delivery Anes PTL Lv  5 Ectopic           4 Term      Vag-Spont   LIV  3 Term      Vag-Spont   LIV  2 Term      Vag-Spont   LIV  1 Term      Vag-Spont   LIV      Past Medical History:  Diagnosis Date  . Anxiety   . Apnea, sleep    no CPAP use, states lost machine during move to [place]  . Bipolar 1 disorder (HCC)   . Bipolar disorder (HCC)    no current med.  . Depression   . History of MRSA infection 2010  . Irritable bowel syndrome (IBS)    no current med.  . Pelvic inflammatory  disease   . Scalp laceration    staples to be removed 03/13/2014  . Ulnar fracture 03/09/2014   left    Past Surgical History:  Procedure Laterality Date  . CYST EXCISION     middle of back  . ORIF ULNAR FRACTURE Left 03/16/2014   Procedure: OPEN REDUCTION INTERNAL FIXATION (ORIF) LEFT ULNAR FRACTURE;  Surgeon: Marlowe Shores, MD;  Location:  SUR[facility]ERY CENTER;  Service: Orthopedics;  Laterality: Left;  left  . OVARIAN CYST REMOVAL    . THYROID CYST EXCISION     nodule exc.  . TUBAL LIGATION       Current Outpatient Prescriptions:  .  ALPRAZolam (XANAX) 0.5 MG tablet, Take 0.5 mg by mouth at bedtime as needed for anxiety., Disp: , Rfl:  .  HYDROcodone-acetaminophen (NORCO/VICODIN) 5-325 MG tablet, Take 1-2 tablets by mouth every 6 (six) hours as needed for moderate pain., Disp: 10 tablet, Rfl: 0 .  gabapentin (NEURONTIN) 300 MG capsule, Take 1 capsule (300 mg total) by mouth 3 (three) times daily., Disp: 90 capsule, Rfl: 5 .  ibuprofen (ADVIL,MOTRIN) 800 MG tablet, Take 1 tablet (800 mg total) by mouth every 8 (eight) hours  as needed for moderate pain., Disp: 30 tablet, Rfl: 11 .  metroNIDAZOLE (METROGEL VAGINAL) 0.75 % vaginal gel, Place 1 Applicatorful vaginally 2 (two) times daily., Disp: 70 g, Rfl: 6 .  Multiple Vitamins-Iron (MULTIVITAMINS WITH IRON) TABS tablet, Take 1 tablet by mouth daily., Disp: , Rfl:  .  promethazine (PHENERGAN) 25 MG tablet, Take 1 tablet (25 mg total) by mouth every 6 (six) hours as needed for nausea or vomiting. (Patient not taking: Reported on 05/30/2017), Disp: 10 tablet, Rfl: 1 Allergies  Allergen Reactions  . Contrast Media [Iodinated Diagnostic Agents] Itching    Pt developed itching after contrast administration; patient had also received Fentanyl immediately prior to transport to [place]. The itching was without any rash, it was concentrated in the face/nose, and relieved with Benadryl. Possible that itching a side effect of pain medication rather  than contrast.  . Kasandra Knudsen [Lurasidone Hcl] Palpitations and Other (See Comments)    Hot flashes, tremors  . Sulfa Antibiotics Swelling    SWELLING OF EYES  . Bee Venom Swelling    "Bees make my eyes swell."  . Flagyl [Metronidazole] Nausea And Vomiting    PO form causes reaction Topically is fine  . Naproxen Other (See Comments)    NOSE BLEED  . Tramadol Itching and Nausea And Vomiting  . Toradol [Ketorolac Tromethamine] Itching    Able to take Ibuprofen    Social History  Substance Use Topics  . Smoking status: Former Smoker    Years: 3.00    Types: Cigars    Quit date: 04/28/2017  . Smokeless tobacco: Never Used     Comment: 1-2 cig./day  . Alcohol use No     Comment: occ    Family History  Problem Relation Age of Onset  . Heart disease Mother   . Cancer Maternal Grandmother        breast  . Cancer Paternal Grandmother        breast      Review of Systems  Constitutional: negative for fatigue and weight loss Respiratory: negative for cough and wheezing Cardiovascular: negative for chest pain, fatigue and palpitations Gastrointestinal: negative for abdominal pain and change in bowel habits Musculoskeletal:negative for myalgias Neurological: negative for gait problems and tremors Behavioral/Psych: positive for anxiety Endocrine: negative for temperature intolerance    Genitourinary:positive for vaginal discharge - recurrent BV and chronic pelvic pain Integument/breast: positive for breast tenderness    Objective:       Wt 194 lb 11.2 oz (88.3 kg)   BMI 30.49 kg/m  General:   alert  Skin:   no rash or abnormalities  Lungs:   clear to auscultation bilaterally  Heart:   regular rate and rhythm, S1, S2 normal, no murmur, click, rub or gallop  Breasts:   normal without suspicious masses, skin or nipple changes or axillary nodes  Abdomen:  normal findings: no organomegaly, soft, non-tender and no hernia  Pelvis:  External genitalia: normal general  appearance Urinary system: urethral meatus normal and bladder without fullness, nontender Vaginal: normal without tenderness, induration or masses Cervix: normal appearance Adnexa: normal bimanual exam Uterus: anteverted and tender, normal size   Lab Review Urine pregnancy test Labs reviewed yes Radiologic studies reviewed yes  50% of 15 min visit spent on counseling and coordination of care.    Assessment and Plan:     1. Pelvic pain Rx: - Cervicovaginal ancillary only - POCT urine pregnancy - POCT urinalysis dipstick - ibuprofen (ADVIL,MOTRIN) 800 MG tablet; Take 1  tablet (800 mg total) by mouth every 8 (eight) hours as needed for moderate pain.  Dispense: 30 tablet; Refill: 11 - Gabapentin Rx  2. Bacterial vaginosis Rx: - Cervicovaginal ancillary only - metroNIDAZOLE (METROGEL VAGINAL) 0.75 % vaginal gel; Place 1 Applicatorful vaginally 2 (two) times daily.  Dispense: 70 g; Refill: 6  3. Female pelvic congestion syndrome Rx: - gabapentin (NEURONTIN) 300 MG capsule; Take 1 capsule (300 mg total) by mouth 3 (three) times daily.  Dispense: 90 capsule; Refill: 5  4. Mastodynia, female - Ibuprofen Rx.   - will refer for [facility] evaluation if pain continues2  5. Anxiety about health - taking meds prn - managed by PCP  6. Bipolar affective disorder in remission [facility](HCC) - same as above   Plan:     Education reviewed: calcium supplements, depression evaluation, low fat, low cholesterol diet, safe sex/STD prevention and self breast exams. Contraception: Depo-Provera injections. Follow up in: 3 months.   Meds ordered this encounter  Medications  . ibuprofen (ADVIL,MOTRIN) 800 MG tablet    Sig: Take 1 tablet (800 mg total) by mouth every 8 (eight) hours as needed for moderate pain.    Dispense:  30 tablet    Refill:  11  . gabapentin (NEURONTIN) 300 MG capsule    Sig: Take 1 capsule (300 mg total) by mouth 3 (three) times daily.    Dispense:  90 capsule     Refill:  5  . metroNIDAZOLE (METROGEL VAGINAL) 0.75 % vaginal gel    Sig: Place 1 Applicatorful vaginally 2 (two) times daily.    Dispense:  70 g    Refill:  6   Orders Placed This Encounter  Procedures  . POCT urine pregnancy  . POCT urinalysis dipstick

## 2017-06-14 ENCOUNTER — Ambulatory Visit: Payer: Medicaid Other

## 2017-06-15 ENCOUNTER — Emergency Department (HOSPITAL_COMMUNITY): Payer: Medicaid Other

## 2017-06-15 ENCOUNTER — Encounter (HOSPITAL_COMMUNITY): Payer: Self-pay

## 2017-06-15 ENCOUNTER — Emergency Department (HOSPITAL_COMMUNITY)
Admission: EM | Admit: 2017-06-15 | Discharge: 2017-06-15 | Disposition: A | Discharge status: Home / Self Care | Payer: Medicaid Other | Attending: Emergency Medicine | Admitting: Emergency Medicine

## 2017-06-15 DIAGNOSIS — L03114 Cellulitis of left upper limb: Secondary | ICD-10-CM | POA: Diagnosis not present

## 2017-06-15 DIAGNOSIS — R600 Localized edema: Secondary | ICD-10-CM | POA: Diagnosis present

## 2017-06-15 LAB — BASIC METABOLIC PANEL
Anion gap: 8 (ref 5–15)
BUN: 12 mg/dL (ref 6–20)
CO2: 25 mmol/L (ref 22–32)
Calcium: 8.6 mg/dL — ABNORMAL LOW (ref 8.9–10.3)
Chloride: 110 mmol/L (ref 101–111)
Creatinine, Ser: 0.72 mg/dL (ref 0.44–1.00)
GFR calc Af Amer: >60 mL/min (ref >60)
GFR calc non Af Amer: >60 mL/min (ref >60)
Glucose, Bld: 107 mg/dL — ABNORMAL HIGH (ref 65–99)
Potassium: 3.5 mmol/L (ref 3.5–5.1)
Sodium: 143 mmol/L (ref 135–145)

## 2017-06-15 LAB — CBC WITH DIFFERENTIAL/PLATELET
Basophils Absolute: 0 10*3/uL (ref 0.0–0.1)
Basophils Relative: 0 %
Eosinophils Absolute: 1 10*3/uL — ABNORMAL HIGH (ref 0.0–0.7)
Eosinophils Relative: 6 %
HCT: 37.2 % (ref 36.0–46.0)
Hemoglobin: 13.3 g/dL (ref 12.0–15.0)
Lymphocytes Relative: 22 %
Lymphs Abs: 3.4 10*3/uL (ref 0.7–4.0)
MCH: 27.5 pg (ref 26.0–34.0)
MCHC: 35.8 g/dL (ref 30.0–36.0)
MCV: 77 fL — ABNORMAL LOW (ref 78.0–100.0)
Monocytes Absolute: 1.5 10*3/uL — ABNORMAL HIGH (ref 0.1–1.0)
Monocytes Relative: 10 %
Neutro Abs: 9.7 10*3/uL — ABNORMAL HIGH (ref 1.7–7.7)
Neutrophils Relative %: 62 %
Platelets: 263 10*3/uL (ref 150–400)
RBC: 4.83 MIL/uL (ref 3.87–5.11)
RDW: 12.9 % (ref 11.5–15.5)
WBC: 15.6 10*3/uL — ABNORMAL HIGH (ref 4.0–10.5)

## 2017-06-15 LAB — I-STAT CG4 LACTIC ACID, ED: Lactic Acid, Venous: 1.33 mmol/L (ref 0.5–1.9)

## 2017-06-15 MED ORDER — CLINDAMYCIN HCL 150 MG PO CAPS: 450.0000 mg | ORAL_CAPSULE | Freq: Three times a day (TID) | ORAL | 0 refills | Status: AC

## 2017-06-15 MED ORDER — CLINDAMYCIN PHOSPHATE 600 MG/50ML IV SOLN
600.0000 mg | Freq: Once | INTRAVENOUS | Status: AC
  Administered 2017-06-15: 600 mg via INTRAVENOUS
  Filled 2017-06-15: qty 50

## 2017-06-15 MED ORDER — HYDROCODONE-ACETAMINOPHEN 5-325 MG PO TABS
2.0000 | ORAL_TABLET | Freq: Once | ORAL | Status: AC
  Administered 2017-06-15: 2 via ORAL
  Filled 2017-06-15: qty 2

## 2017-06-15 MED ORDER — FENTANYL CITRATE (PF) 100 MCG/2ML IJ SOLN
50.0000 ug | Freq: Once | INTRAMUSCULAR | Status: AC
  Administered 2017-06-15: 50 ug via INTRAMUSCULAR
  Filled 2017-06-15: qty 2

## 2017-06-15 MED ORDER — SODIUM CHLORIDE 0.9 % IV BOLUS (SEPSIS)
500.0000 mL | Freq: Once | INTRAVENOUS | Status: AC
  Administered 2017-06-15: 500 mL via INTRAVENOUS

## 2017-06-15 MED ORDER — ACETAMINOPHEN 500 MG PO TABS
1000.0000 mg | ORAL_TABLET | Freq: Once | ORAL | Status: DC
  Filled 2017-06-15: qty 2

## 2017-06-15 NOTE — ED Triage Notes (Signed)
Patient c/o left hand swelling and redness x 3 days. Patient  States she does not know if she was bitten by something or an injury. Patient has redness of the left hand left forearm area. Patient also reports swelling of the inner cheek on the right side since yesterday.

## 2017-06-15 NOTE — Discharge Instructions (Signed)
You were evaluated in the emergency department for redness, swelling, pain to your left wrist. Your lab work was reassuring. Your x-ray was also normal. Your symptoms are most consistent with cellulitis. There was no abscess on bedside ultrasound done today. Please take antibiotic as prescribed. Additionally, you need anti-inflammatory medications to help with the inflammatory process that is causing your pain. Take 600 mg of ibuprofen +1000 mg of Tylenol together every 8 hours. Elevate your wrist to help with swelling. Monitor the redness, return to the emergency department or with your PCP in 2 days for recheck. Sooner if you develop fever, body aches, chills, decreased or inability to move you hand.   The emergency department has a strict policy regarding prescription of narcotic medications for chronic pain. Contact your primary care provider or specialist for chronic pain management and refill on narcotic medications.

## 2017-06-15 NOTE — ED Provider Notes (Signed)
[facility]-EMERGENCY DEPT Provider Note   CSN: [id number] Arrival date & time: 06/15/17  1752     History   Chief Complaint Chief Complaint  Patient presents with  . hand swelling    HPI Leah Barry is a 31 y.o. female with history of bipolar disorder, anemia, PID presents to the ED for evaluation of gradually worsening redness, warmth, pain to her left wrist 3 days. Unsure if she was bit by an insect. Aggravating factors include direct palpation and movement of the wrist. No alleviating factors. Denies trauma. No fevers, myalgias, chills, nausea, vomiting. Denies IV drug use.  HPI  Past Medical History:  Diagnosis Date  . Anxiety   . Apnea, sleep    no CPAP use, states lost machine during move to [place]  . Bipolar 1 disorder (HCC)   . Bipolar disorder (HCC)    no current med.  . Depression   . History of MRSA infection 2010  . Irritable bowel syndrome (IBS)    no current med.  . Pelvic inflammatory disease   . Scalp laceration    staples to be removed 03/13/2014  . Ulnar fracture 03/09/2014   left    Patient Active Problem List   Diagnosis Date Noted  . Dysmenorrhea 05/28/2014  . GC (gonococcus) 05/28/2014  . Chlamydia contact 05/28/2014  . Pain 04/14/2014  . UTI symptoms 04/14/2014  . Routine general medical examination at a health care facility 09/26/2013  . Candidiasis of vulva and vagina 09/26/2013  . Unspecified symptom associated with female genital organs 09/10/2013  . Abnormal uterine bleeding (AUB) 09/10/2013  . PID (pelvic inflammatory disease) 07/29/2013  . Dysuria 07/29/2013  . Screening examination for venereal disease 07/29/2013  . Chronic PID (chronic pelvic inflammatory disease) 06/30/2013  . Excessive or frequent menstruation 06/30/2013  . Genital herpes, unspecified 06/30/2013  . Unspecified inflammatory disease of female pelvic organs and tissues 03/12/2013    Past Surgical History:  Procedure Laterality Date  . CYST EXCISION     middle of  back  . ORIF ULNAR FRACTURE Left 03/16/2014   Procedure: OPEN REDUCTION INTERNAL FIXATION (ORIF) LEFT ULNAR FRACTURE;  Surgeon: Marlowe Shores, MD;  Location: [facility];  Service: Orthopedics;  Laterality: Left;  left  . OVARIAN CYST REMOVAL    . THYROID CYST EXCISION     nodule exc.  . TUBAL LIGATION      OB History    Gravida Para Term Preterm AB Living   5 4 4   1 4    SAB TAB Ectopic Multiple Live Births       1   4       Home Medications    Prior to Admission medications   Medication Sig Start Date End Date Taking? Authorizing Provider  ALPRAZolam Prudy Feeler) 0.5 MG tablet Take 0.5 mg by mouth at bedtime as needed for anxiety.   Yes [provider]  gabapentin (NEURONTIN) 300 MG capsule Take 1 capsule (300 mg total) by mouth 3 (three) times daily. 05/30/17  Yes Brock Bad, MD  ibuprofen (ADVIL,MOTRIN) 800 MG tablet Take 1 tablet (800 mg total) by mouth every 8 (eight) hours as needed for moderate pain. 05/30/17  Yes Brock Bad, MD  metroNIDAZOLE (METROGEL VAGINAL) 0.75 % vaginal gel Place 1 Applicatorful vaginally 2 (two) times daily. 05/30/17  Yes Brock Bad, MD  Multiple Vitamins-Iron (MULTIVITAMINS WITH IRON) TABS tablet Take 1 tablet by mouth daily.   Yes [provider]  sertraline (  ZOLOFT) 100 MG tablet Take 100 mg by mouth daily. 06/02/17  Yes [provider]  clindamycin (CLEOCIN) 150 MG capsule Take 3 capsules (450 mg total) by mouth 3 (three) times daily. 06/15/17 06/22/17  Liberty Handy, PA-C  HYDROcodone-acetaminophen (NORCO/VICODIN) 5-325 MG tablet Take 1-2 tablets by mouth every 6 (six) hours as needed for moderate pain. Patient not taking: Reported on 06/15/2017 05/27/17   Vanetta Mulders, MD  MedroxyPROGESTERone Acetate 150 MG/ML SUSY INJ INTO THE MUSCLE Q 3 MONTHS 03/26/17   [provider]  promethazine (PHENERGAN) 25 MG tablet Take 1 tablet (25 mg total) by mouth every 6 (six) hours as needed for  nausea or vomiting. Patient not taking: Reported on 05/30/2017 05/27/17   Vanetta Mulders, MD    Family History Family History  Problem Relation Age of Onset  . Heart disease Mother   . Cancer Maternal Grandmother        breast  . Cancer Paternal Grandmother        breast    Social History Social History  Substance Use Topics  . Smoking status: Former Smoker    Years: 3.00    Types: Cigars    Quit date: 04/28/2017  . Smokeless tobacco: Never Used     Comment: 1-2 cig./day  . Alcohol use No     Comment: occ     Allergies   Contrast media [iodinated diagnostic agents]; Latuda [lurasidone hcl]; Sulfa antibiotics; Bee venom; Flagyl [metronidazole]; Naproxen; Tramadol; and Toradol [ketorolac tromethamine]   Review of Systems Review of Systems  Constitutional: Negative for chills and fever.  Respiratory: Negative for cough.   Cardiovascular: Negative for chest pain.  Gastrointestinal: Negative for abdominal pain, diarrhea and vomiting.  Genitourinary: Negative for difficulty urinating, dysuria and hematuria.  Musculoskeletal: Positive for arthralgias. Negative for myalgias.  Skin: Positive for color change.     Physical Exam Updated Vital Signs BP 128/86 (BP Location: Right Arm)   Pulse 68   Temp 98.6 F (37 C) (Oral)   Resp 18   Ht 5\' 7"  (1.702 m)   Wt 88 kg (194 lb)   SpO2 100%   BMI 30.38 kg/m   Physical Exam  Constitutional: She is oriented to person, place, and time. She appears well-developed and well-nourished. No distress.  NAD.  HENT:  Head: Normocephalic and atraumatic.  Right Ear: External ear normal.  Left Ear: External ear normal.  Nose: Nose normal.  Eyes: Conjunctivae and EOM are normal. No scleral icterus.  Neck: Normal range of motion. Neck supple.  Cardiovascular: Normal rate, regular rhythm and normal heart sounds.   No murmur heard. Pulmonary/Chest: Effort normal and breath sounds normal. She has no wheezes.  Musculoskeletal: Normal  range of motion. She exhibits edema and tenderness. She exhibits no deformity.  Tenderness, edema to left wrist and dorsal hand. Full passive range of motion of the left wrist and fingers with reported pain. Thumb opposition intact.  Neurological: She is alert and oriented to person, place, and time.  Skin: Skin is warm and dry. Capillary refill takes less than 2 seconds. There is erythema.  Well-demarcated area of erythema, edema, warmth and tenderness to left wrist. See picture. Tiny abrasion noted to dorsal thenar prominence. No track marks upper extremities.  Psychiatric: She has a normal mood and affect. Her behavior is normal. Judgment and thought content normal.  Nursing note and vitals reviewed.        ED Treatments / Results  Labs (all labs ordered are listed,  but only abnormal results are displayed) Labs Reviewed  CBC WITH DIFFERENTIAL/PLATELET - Abnormal; Notable for the following:       Result Value   WBC 15.6 (*)    MCV 77.0 (*)    Neutro Abs 9.7 (*)    Monocytes Absolute 1.5 (*)    Eosinophils Absolute 1.0 (*)    All other components within normal limits  BASIC METABOLIC PANEL - Abnormal; Notable for the following:    Glucose, Bld 107 (*)    Calcium 8.6 (*)    All other components within normal limits  CULTURE, BLOOD (ROUTINE X 2)  CULTURE, BLOOD (ROUTINE X 2)  I-STAT CG4 LACTIC ACID, ED    EKG  EKG Interpretation None       Radiology Dg Wrist Complete Left  Result Date: 06/15/2017 CLINICAL DATA:  Left thumb and radial side left wrist swelling and redness for 3 days. EXAM: LEFT WRIST - COMPLETE 3+ VIEW COMPARISON:  None. FINDINGS: Osseous alignment is normal. No acute or suspicious osseous lesion. No fracture line or displaced fracture fragment. Plate and screw fixation hardware appears well positioned in the distal ulna. Soft tissues about the left wrist are unremarkable. No soft tissue gas seen. IMPRESSION: No acute osseous or soft tissue findings.  Electronically Signed   By: Bary RichardStan  Maynard M.D.   On: 06/15/2017 19:37    Procedures Procedures (including critical care time)  Medications Ordered in ED Medications  acetaminophen (TYLENOL) tablet 1,000 mg (1,000 mg Oral Refused 06/15/17 1944)  clindamycin (CLEOCIN) IVPB 600 mg (0 mg Intravenous Stopped 06/15/17 2021)  sodium chloride 0.9 % bolus 500 mL (0 mLs Intravenous Stopped 06/15/17 2021)  HYDROcodone-acetaminophen (NORCO/VICODIN) 5-325 MG per tablet 2 tablet (2 tablets Oral Given 06/15/17 2019)  fentaNYL (SUBLIMAZE) injection 50 mcg (50 mcg Intramuscular Given 06/15/17 2136)     Initial Impression / Assessment and Plan / ED Course  I have reviewed the triage vital signs and the nursing notes.  Pertinent labs & imaging results that were available during my care of the patient were reviewed by me and considered in my medical decision making (see chart for details).  Clinical Course as of Jun 15 2138  Fri Jun 15, 2017  2007 Pt refused 1g tylenol given for inflammation and pain; requests something stronger. Expressed her frustration to Charity fundraiserN. She gets monthly prescriptions of hydrocodone by Dr. Ronne BinningMckenzie, last dispensed on 7/11 for 30 days. Will give her home dose of hydrocodone  [CG]  2035 WBC: (!) 15.6 [CG]  2126 Pt requesting a shot of "something strong" before discharge. She has monthly prescriptions of vicodin by PCP, she admitted to "always running out" early because she takes extra doses sometimes for pain, she should have 8 days left of vicodin but has already ran out. Will give IM pain med for obvious cellulitis and likely pain from this, no indication for prescription for narcotics tonight.  [CG]    Clinical Course User Index [CG] Liberty HandyGibbons, Jacklin Zwick J, PA-C   31 year old female with history of bipolar disorder, anemia, chronic PID, documented MRSA infection, ORIF of left ulnar fx presents to the ED with worsening left wrist and hand erythema, edema, warmth and tenderness. Extremity is  neurovascularly intact, full active and passive ROM of the wrist and fingers, although with pain she is still able to do this. Physical exam was consistent with cellulitis. Bedside ultrasound of wrist and hand revealed cobblestoning but no abscess. She has had no fever, chills, myalgias. We'll get x-ray, basic blood  work, give 1 dose of antibiotics in the ED. Anticipate discharge with antibiotics and follow-up within 2 days for recheck.  930PM: lab work reassuring, leukocytosis with WBCs at 15 but no constitutional symptoms. Lactic acid normal. She was given IV clindamycin, IVF, norco in ED. She refused 1g tylenol and requested something stronger.  narcotic database reviewed, she gets monthly rx for vicodin by PCP for chronic left wrist pain, last dispensed on 7/11 for 30 day supply, she should have 8 day supply left. When asked about this, pt admitted to taking extra doses for pain, states her PCP knows she does this and won't give her more. Prior to d/c she specifically requested a shot of pain medication, declined ibuprofen. Given obvious cellulitis and likely pain will give fentanyl IM and discharge. No indicated for rx for narcotic tonight. She needs to f/u with PCP for chronic pain management and refill of vicodin.   Final Clinical Impressions(s) / ED Diagnoses   Final diagnoses:  Cellulitis of left upper extremity    New Prescriptions New Prescriptions   CLINDAMYCIN (CLEOCIN) 150 MG CAPSULE    Take 3 capsules (450 mg total) by mouth 3 (three) times daily.     Liberty HandyGibbons, Treylin Burtch J, PA-C 06/15/17 2139    Nira Connardama, Pedro Eduardo, MD 06/16/17 [phone]

## 2017-06-15 NOTE — ED Notes (Signed)
Both set of cultures obtained prior to antibiotic treatment.

## 2017-06-20 LAB — CULTURE, BLOOD (ROUTINE X 2)
Culture: NO GROWTH
Culture: NO GROWTH
Special Requests: ADEQUATE

## 2017-07-17 ENCOUNTER — Emergency Department (HOSPITAL_COMMUNITY): Admission: EM | Admit: 2017-07-17 | Discharge: 2017-07-17 | Discharge status: Against Medical Advice | Payer: Self-pay

## 2017-07-17 NOTE — ED Notes (Signed)
Called for Pt in lobby for triage and vitals, no response.

## 2017-07-31 ENCOUNTER — Emergency Department (HOSPITAL_COMMUNITY)
Admission: EM | Admit: 2017-07-31 | Discharge: 2017-07-31 | Disposition: A | Discharge status: Home / Self Care | Payer: Medicaid Other | Attending: Emergency Medicine | Admitting: Emergency Medicine

## 2017-07-31 ENCOUNTER — Emergency Department (HOSPITAL_COMMUNITY): Payer: Medicaid Other

## 2017-07-31 ENCOUNTER — Encounter (HOSPITAL_COMMUNITY): Payer: Self-pay

## 2017-07-31 DIAGNOSIS — Y929 Unspecified place or not applicable: Secondary | ICD-10-CM | POA: Diagnosis not present

## 2017-07-31 DIAGNOSIS — Y999 Unspecified external cause status: Secondary | ICD-10-CM | POA: Insufficient documentation

## 2017-07-31 DIAGNOSIS — W230XXA Caught, crushed, jammed, or pinched between moving objects, initial encounter: Secondary | ICD-10-CM | POA: Insufficient documentation

## 2017-07-31 DIAGNOSIS — Y939 Activity, unspecified: Secondary | ICD-10-CM | POA: Diagnosis not present

## 2017-07-31 DIAGNOSIS — Z79899 Other long term (current) drug therapy: Secondary | ICD-10-CM | POA: Insufficient documentation

## 2017-07-31 DIAGNOSIS — Z87891 Personal history of nicotine dependence: Secondary | ICD-10-CM | POA: Insufficient documentation

## 2017-07-31 DIAGNOSIS — S6992XA Unspecified injury of left wrist, hand and finger(s), initial encounter: Secondary | ICD-10-CM

## 2017-07-31 MED ORDER — OXYCODONE-ACETAMINOPHEN 5-325 MG PO TABS
1.0000 | ORAL_TABLET | Freq: Once | ORAL | Status: AC
  Administered 2017-07-31: 1 via ORAL
  Filled 2017-07-31: qty 1

## 2017-07-31 MED ORDER — HYDROCODONE-ACETAMINOPHEN 5-325 MG PO TABS: 1.0000 | ORAL_TABLET | Freq: Four times a day (QID) | ORAL | 0 refills | Status: DC | PRN

## 2017-07-31 MED ORDER — IBUPROFEN 600 MG PO TABS: 600.0000 mg | ORAL_TABLET | Freq: Four times a day (QID) | ORAL | 0 refills | Status: DC | PRN

## 2017-07-31 MED ORDER — OXYCODONE-ACETAMINOPHEN 5-325 MG PO TABS
1.0000 | ORAL_TABLET | ORAL | Status: DC | PRN
  Administered 2017-07-31: 1 via ORAL
  Filled 2017-07-31: qty 1

## 2017-07-31 NOTE — ED Provider Notes (Signed)
[facility] DEPT Provider Note   CSN: [id number] Arrival date & time: 07/31/17  0327     History   Chief Complaint Chief Complaint  Patient presents with  . Wrist Pain    HPI Leah Barry is a 31 y.o. female.  HPI   Leah Barry is a 31 y.o. female, with a history of Bipolar, presenting to the ED with left hand and wrist injury that occurred shortly prior to arrival. States her wrist was shut in a door. She complains of Left wrist and hand pain, 9/10, throbbing, radiating into the fingers of the left hand. States she took a percocet left over from previous left wrist fracture a couple years ago. Denies numbness, weakness, falls, other injuries, or any other complaints.   Past Medical History:  Diagnosis Date  . Anxiety   . Apnea, sleep    no CPAP use, states lost machine during move to [place]  . Bipolar 1 disorder (HCC)   . Bipolar disorder (HCC)    no current med.  . Depression   . History of MRSA infection 2010  . Irritable bowel syndrome (IBS)    no current med.  . Pelvic inflammatory disease   . Scalp laceration    staples to be removed 03/13/2014  . Ulnar fracture 03/09/2014   left    Patient Active Problem List   Diagnosis Date Noted  . Dysmenorrhea 05/28/2014  . GC (gonococcus) 05/28/2014  . Chlamydia contact 05/28/2014  . Pain 04/14/2014  . UTI symptoms 04/14/2014  . Routine general medical examination at a health care facility 09/26/2013  . Candidiasis of vulva and vagina 09/26/2013  . Unspecified symptom associated with female genital organs 09/10/2013  . Abnormal uterine bleeding (AUB) 09/10/2013  . PID (pelvic inflammatory disease) 07/29/2013  . Dysuria 07/29/2013  . Screening examination for venereal disease 07/29/2013  . Chronic PID (chronic pelvic inflammatory disease) 06/30/2013  . Excessive or frequent menstruation 06/30/2013  . Genital herpes, unspecified 06/30/2013  . Unspecified inflammatory disease of female pelvic organs and  tissues 03/12/2013    Past Surgical History:  Procedure Laterality Date  . CYST EXCISION     middle of back  . ORIF ULNAR FRACTURE Left 03/16/2014   Procedure: OPEN REDUCTION INTERNAL FIXATION (ORIF) LEFT ULNAR FRACTURE;  Surgeon: Marlowe Shores, MD;  Location: [facility];  Service: Orthopedics;  Laterality: Left;  left  . OVARIAN CYST REMOVAL    . THYROID CYST EXCISION     nodule exc.  . TUBAL LIGATION      OB History    Gravida Para Term Preterm AB Living   SAB TAB Ectopic Multiple Live Births       1   4       Home Medications    Prior to Admission medications   Medication Sig Start Date End Date Taking? Authorizing Provider  ALPRAZolam Prudy Feeler) 0.5 MG tablet Take 0.5 mg by mouth at bedtime as needed for anxiety.    [provider]  gabapentin (NEURONTIN) 300 MG capsule Take 1 capsule (300 mg total) by mouth 3 (three) times daily. 05/30/17   Brock Bad, MD  HYDROcodone-acetaminophen (NORCO/VICODIN) 5-325 MG tablet Take 1-2 tablets by mouth every 6 (six) hours as needed for severe pain. 07/31/17   Weslie Pretlow C, PA-C  ibuprofen (ADVIL,MOTRIN) 600 MG tablet Take 1 tablet (600 mg total) by mouth every 6 (six) hours as needed. 07/31/17  Viha Kriegel C, PA-C  MedroxyPROGESTERone Acetate 150 MG/ML SUSY INJ INTO THE MUSCLE Q 3 MONTHS 03/26/17   [provider]  metroNIDAZOLE (METROGEL VAGINAL) 0.75 % vaginal gel Place 1 Applicatorful vaginally 2 (two) times daily. 05/30/17   Brock Bad, MD  Multiple Vitamins-Iron (MULTIVITAMINS WITH IRON) TABS tablet Take 1 tablet by mouth daily.    [provider]  promethazine (PHENERGAN) 25 MG tablet Take 1 tablet (25 mg total) by mouth every 6 (six) hours as needed for nausea or vomiting. Patient not taking: Reported on 05/30/2017 05/27/17   Vanetta Mulders, MD  sertraline (ZOLOFT) 100 MG tablet Take 100 mg by mouth daily. 06/02/17   [provider]    Family  History Family History  Problem Relation Age of Onset  . Heart disease Mother   . Cancer Maternal Grandmother        breast  . Cancer Paternal Grandmother        breast    Social History Social History  Substance Use Topics  . Smoking status: Former Smoker    Years: 3.00    Types: Cigars    Quit date: 04/28/2017  . Smokeless tobacco: Never Used     Comment: 1-2 cig./day  . Alcohol use No     Comment: occ     Allergies   Contrast media [iodinated diagnostic agents]; Latuda [lurasidone hcl]; Sulfa antibiotics; Bee venom; Flagyl [metronidazole]; Naproxen; Tramadol; and Toradol [ketorolac tromethamine]   Review of Systems Review of Systems  Musculoskeletal: Positive for arthralgias.  Neurological: Negative for weakness and numbness.     Physical Exam Updated Vital Signs BP 100/82 (BP Location: Right Arm)   Pulse 100   Temp 97.8 F (36.6 C) (Oral)   Resp 20   SpO2 94%   Physical Exam  Constitutional: She appears well-developed and well-nourished. No distress.  HENT:  Head: Normocephalic and atraumatic.  Eyes: Conjunctivae are normal.  Neck: Neck supple.  Cardiovascular: Normal rate, regular rhythm and intact distal pulses.   Pulmonary/Chest: Effort normal.  Musculoskeletal: She exhibits tenderness. She exhibits no edema or deformity.  Tenderness to the left anatomical snuffbox as well as the first and second metacarpals. Full range of motion at the MCP, PIP, and DIP joints of the second through fifth digits of the left hand, though painful. Range of motion in the left thumb at the MP joint limited by significant pain. Full range of motion in the IP joint of the left thumb. For range of motion in the cardinal directions of the left wrist, though painful. Significant pain especially with radial deviation.  Neurological: She is alert.  4/5 strength with flexion and extension at the MCP, PIP, and DIP joints of digits 2 through 5 on the left hand. 3/5 strength in each of  the cardinal directions of the left thumb at the MP joint. 5/5 strength in the cardinal directions of the left wrist.  Skin: Skin is warm and dry. Capillary refill takes less than 2 seconds. She is not diaphoretic. No pallor.  Psychiatric: She has a normal mood and affect. Her behavior is normal.  Nursing note and vitals reviewed.    ED Treatments / Results  Labs (all labs ordered are listed, but only abnormal results are displayed) Labs Reviewed - No data to display  EKG  EKG Interpretation None       Radiology Dg Forearm Left  Result Date: 07/31/2017 CLINICAL DATA:  Left arm injury. Arm was slammed in a door. Pain distal  forearm along the radial side radiating to the elbow. EXAM: LEFT FOREARM - 2 VIEW COMPARISON:  Left wrist 06/15/2017.  Left forearm 03/09/2014. FINDINGS: Previous plate and screw fixation of an old healed fracture of the distal left ulna. No evidence of acute fracture or dislocation in the left forearm. No focal bone lesion or bone destruction. Soft tissues are unremarkable. IMPRESSION: Internal fixation of an old distal left ulnar fracture. No acute fractures identified. Electronically Signed   By: Burman Nieves M.D.   On: 07/31/2017 04:01   Dg Hand Complete Left  Result Date: 07/31/2017 CLINICAL DATA:  Patient slammed her hand in a door; diffuse left hand and wrist pain EXAM: LEFT HAND - COMPLETE 3+ VIEW COMPARISON:  Left wrist 06/15/2017 FINDINGS: Left hand appears intact. Postoperative changes in the distal ulna with plate and screw fixation. No evidence of acute fracture or subluxation. No focal bone lesion or bone destruction. Bone cortex and trabecular architecture appear intact. No radiopaque soft tissue foreign bodies. IMPRESSION: No acute bony abnormalities. Electronically Signed   By: Burman Nieves M.D.   On: 07/31/2017 06:59    Procedures .Splint Application Date/Time: 07/31/2017 7:25 AM Performed by: Anselm Pancoast Authorized by: Anselm Pancoast    Consent:    Consent obtained:  Verbal   Consent given by:  Patient Pre-procedure details:    Sensation:  Normal   Skin color:  Normal Procedure details:    Laterality:  Left   Location:  Hand   Hand:  L hand   Splint type:  Thumb spica   Supplies:  Elastic bandage and Ortho-Glass Post-procedure details:    Pain:  Improved   Sensation:  Normal   Skin color:  Normal   Patient tolerance of procedure:  Tolerated well, no immediate complications Comments:     Procedure was performed by the Ortho Tech with my evaluation before and after. I was available for consultation throughout the procedure.    (including critical care time)  Medications Ordered in ED Medications  oxyCODONE-acetaminophen (PERCOCET/ROXICET) 5-325 MG per tablet 1 tablet (1 tablet Oral Given 07/31/17 0405)  oxyCODONE-acetaminophen (PERCOCET/ROXICET) 5-325 MG per tablet 1 tablet (1 tablet Oral Given 07/31/17 0631)     Initial Impression / Assessment and Plan / ED Course  I have reviewed the triage vital signs and the nursing notes.  Pertinent labs & imaging results that were available during my care of the patient were reviewed by me and considered in my medical decision making (see chart for details).      Patient presents with a left hand and wrist injury. No acute abnormalities noted on x-ray. However, patient has tenderness to the anatomical snuffbox. Splint and hand specialist follow-up. The patient was given instructions for home care as well as return precautions. Patient voices understanding of these instructions, accepts the plan, and is comfortable with discharge.   Findings and plan of care discussed with Leah Libra, MD.   Vitals:   07/31/17 0329 07/31/17 0747  BP: 100/82 123/77  Pulse: 100 (!) 127  Resp: 20 16  Temp: 97.8 F (36.6 C)   TempSrc: Oral   SpO2: 94% 100%   The tachycardia above was noted. I reexamined the patient after her splint was in place and found her pulse to be 98. She  states the splinting process was very painful, but her pain is much better now.  Final Clinical Impressions(s) / ED Diagnoses   Final diagnoses:  Injury of left hand, initial encounter  New Prescriptions New Prescriptions   HYDROCODONE-ACETAMINOPHEN (NORCO/VICODIN) 5-325 MG TABLET    Take 1-2 tablets by mouth every 6 (six) hours as needed for severe pain.   IBUPROFEN (ADVIL,MOTRIN) 600 MG TABLET    Take 1 tablet (600 mg total) by mouth every 6 (six) hours as needed.     Anselm Pancoast, PA-C 07/31/17 0745    Anselm Pancoast, PA-C 07/31/17 0817    Leah Libra, MD 07/31/17 2237

## 2017-07-31 NOTE — ED Provider Notes (Signed)
  13:10 [facility] pharmacist, Greig Castilla, called. States patient had 90ct 7.5-325mg  oxycodone-APAP filled on 07/27/2017. She was prescribed 10ct 5-325 mg hydrocodone-APAP here in the ED today for acute pain. The pharmacist wanted to bring these 2 items to my attention and inquire as to whether I wanted the new prescription filled. I instructed the pharmacist to not fill the new prescription and not return this prescription to the patient.   This case and situation was discussed with Dr. Rhunette Croft.    Anselm Pancoast, PA-C 07/31/17 1326    Derwood Kaplan, MD 07/31/17 1425

## 2017-07-31 NOTE — ED Notes (Signed)
Pt states she slammed her hand in the door and she complains of arm, wrist and hand pain

## 2017-07-31 NOTE — ED Notes (Signed)
Bed: [facility] Expected date:  Expected time:  Means of arrival:  Comments: 

## 2017-07-31 NOTE — Discharge Instructions (Signed)
You have been seen today for a hand injury. There were no acute abnormalities on the x-rays, including no sign of fracture or dislocation, however, there was indication of possible deeper injury that will need to be assessed by an expert. Pain: Take 600 mg of ibuprofen every 6 hours or 440 mg (over the counter dose) to 500 mg (prescription dose) of naproxen every 12 hours for the next 3 days. After this time, these medications may be used as needed for pain. Take these medications with food to avoid upset stomach. Choose only one of these medications, do not take them together.  Tylenol: Should you continue to have additional pain while taking the ibuprofen or naproxen, you may add in tylenol as needed. Your daily total maximum amount of tylenol from all sources should be limited to /day for persons without liver problems, or /day for those with liver problems. Ice: May apply ice to the area over the next 24 hours for 15 minutes at a time to reduce swelling. Elevation: Keep the extremity elevated as often as possible to reduce pain and inflammation. Support: Keep the splint clean and dry.  Follow up: Follow up with the hand specialist as soon as possible in this manner.

## 2017-08-04 ENCOUNTER — Encounter (HOSPITAL_COMMUNITY): Payer: Self-pay | Admitting: Emergency Medicine

## 2017-08-04 ENCOUNTER — Emergency Department (HOSPITAL_COMMUNITY)
Admission: EM | Admit: 2017-08-04 | Discharge: 2017-08-04 | Disposition: A | Discharge status: Home / Self Care | Payer: Medicaid Other | Attending: Emergency Medicine | Admitting: Emergency Medicine

## 2017-08-04 DIAGNOSIS — Z79899 Other long term (current) drug therapy: Secondary | ICD-10-CM | POA: Insufficient documentation

## 2017-08-04 DIAGNOSIS — M79645 Pain in left finger(s): Secondary | ICD-10-CM | POA: Insufficient documentation

## 2017-08-04 DIAGNOSIS — Z87891 Personal history of nicotine dependence: Secondary | ICD-10-CM | POA: Insufficient documentation

## 2017-08-04 DIAGNOSIS — G8929 Other chronic pain: Secondary | ICD-10-CM

## 2017-08-04 DIAGNOSIS — Z3202 Encounter for pregnancy test, result negative: Secondary | ICD-10-CM | POA: Diagnosis not present

## 2017-08-04 LAB — COMPREHENSIVE METABOLIC PANEL
ALT: 15 U/L (ref 14–54)
AST: 16 U/L (ref 15–41)
Albumin: 3.5 g/dL (ref 3.5–5.0)
Alkaline Phosphatase: 64 U/L (ref 38–126)
Anion gap: 6 (ref 5–15)
BUN: 11 mg/dL (ref 6–20)
CO2: 25 mmol/L (ref 22–32)
Calcium: 8.5 mg/dL — ABNORMAL LOW (ref 8.9–10.3)
Chloride: 110 mmol/L (ref 101–111)
Creatinine, Ser: 0.55 mg/dL (ref 0.44–1.00)
GFR calc Af Amer: >60 mL/min (ref >60)
GFR calc non Af Amer: >60 mL/min (ref >60)
Glucose, Bld: 82 mg/dL (ref 65–99)
Potassium: 3.5 mmol/L (ref 3.5–5.1)
Sodium: 141 mmol/L (ref 135–145)
Total Bilirubin: 0.4 mg/dL (ref 0.3–1.2)
Total Protein: 6 g/dL — ABNORMAL LOW (ref 6.5–8.1)

## 2017-08-04 LAB — URINALYSIS, ROUTINE W REFLEX MICROSCOPIC
Bilirubin Urine: NEGATIVE
Glucose, UA: NEGATIVE mg/dL
Hgb urine dipstick: NEGATIVE
Ketones, ur: NEGATIVE mg/dL
Leukocytes, UA: NEGATIVE
Nitrite: NEGATIVE
Protein, ur: NEGATIVE mg/dL
Specific Gravity, Urine: 1.021 (ref 1.005–1.030)
pH: 5 (ref 5.0–8.0)

## 2017-08-04 LAB — LIPASE, BLOOD: Lipase: 28 U/L (ref 11–51)

## 2017-08-04 LAB — CBC
HCT: 37.8 % (ref 36.0–46.0)
Hemoglobin: 13.1 g/dL (ref 12.0–15.0)
MCH: 27.2 pg (ref 26.0–34.0)
MCHC: 34.7 g/dL (ref 30.0–36.0)
MCV: 78.4 fL (ref 78.0–100.0)
Platelets: 216 10*3/uL (ref 150–400)
RBC: 4.82 MIL/uL (ref 3.87–5.11)
RDW: 13.2 % (ref 11.5–15.5)
WBC: 7.2 10*3/uL (ref 4.0–10.5)

## 2017-08-04 LAB — I-STAT BETA HCG BLOOD, ED (MC, WL, AP ONLY): I-stat hCG, quantitative: <5 m[IU]/mL (ref <5)

## 2017-08-04 MED ORDER — PREDNISONE 10 MG PO TABS: 20.0000 mg | ORAL_TABLET | Freq: Two times a day (BID) | ORAL | 0 refills | Status: DC

## 2017-08-04 NOTE — ED Provider Notes (Signed)
[facility] Provider Note   CSN: [id number] Arrival date & time: 08/04/17  1416     History   Chief Complaint Chief Complaint  Patient presents with  . Hand Pain  . Possible Pregnancy    HPI Leah Barry is a 31 y.o. female with hx of chronic pain and pelvic congestion syndrome who presents to the ED with chronic hand pain and amenorrhea. Patient reports that she sees Dr. Clearance Coots for her pelvic congestion and he has her on medications. She had an ectopic pregnancy after having her tubes tied in 2016. She has been getting depo provera since then but did not get her dose that was due in July. She has not had a period in august or September so she is concerned about another ectopic pregnancy. She see Dr. Ronne Binning for there left wrist/thumb pain and he has her on oxycodone 10/325. She was referred to the hand surgeon on her last visit here but did not make appointment.   HPI  Past Medical History:  Diagnosis Date  . Anxiety   . Apnea, sleep    no CPAP use, states lost machine during move to [place]  . Bipolar 1 disorder (HCC)   . Bipolar disorder (HCC)    no current med.  . Depression   . History of MRSA infection 2010  . Irritable bowel syndrome (IBS)    no current med.  . Pelvic inflammatory disease   . Scalp laceration    staples to be removed 03/13/2014  . Ulnar fracture 03/09/2014   left    Patient Active Problem List   Diagnosis Date Noted  . Dysmenorrhea 05/28/2014  . GC (gonococcus) 05/28/2014  . Chlamydia contact 05/28/2014  . Pain 04/14/2014  . UTI symptoms 04/14/2014  . Routine general medical examination at a health care facility 09/26/2013  . Candidiasis of vulva and vagina 09/26/2013  . Unspecified symptom associated with female genital organs 09/10/2013  . Abnormal uterine bleeding (AUB) 09/10/2013  . PID (pelvic inflammatory disease) 07/29/2013  . Dysuria 07/29/2013  . Screening examination for venereal disease 07/29/2013  . Chronic PID (chronic  pelvic inflammatory disease) 06/30/2013  . Excessive or frequent menstruation 06/30/2013  . Genital herpes, unspecified 06/30/2013  . Unspecified inflammatory disease of female pelvic organs and tissues 03/12/2013    Past Surgical History:  Procedure Laterality Date  . CYST EXCISION     middle of back  . ORIF ULNAR FRACTURE Left 03/16/2014   Procedure: OPEN REDUCTION INTERNAL FIXATION (ORIF) LEFT ULNAR FRACTURE;  Surgeon: Marlowe Shores, MD;  Location: [facility];  Service: Orthopedics;  Laterality: Left;  left  . OVARIAN CYST REMOVAL    . THYROID CYST EXCISION     nodule exc.  . TUBAL LIGATION      OB History    Gravida Para Term Preterm AB Living   SAB TAB Ectopic Multiple Live Births       1   4       Home Medications    Prior to Admission medications   Medication Sig Start Date End Date Taking? Authorizing Provider  ALPRAZolam Prudy Feeler) 0.5 MG tablet Take 0.5 mg by mouth at bedtime as needed for anxiety.    [provider]  gabapentin (NEURONTIN) 300 MG capsule Take 1 capsule (300 mg total) by mouth 3 (three) times daily. 05/30/17   Brock Bad, MD  HYDROcodone-acetaminophen (NORCO/VICODIN) 5-325 MG tablet Take 1-2 tablets  by mouth every 6 (six) hours as needed for severe pain. 07/31/17   Joy, Shawn C, PA-C  ibuprofen (ADVIL,MOTRIN) 600 MG tablet Take 1 tablet (600 mg total) by mouth every 6 (six) hours as needed. 07/31/17   Joy, Shawn C, PA-C  MedroxyPROGESTERone Acetate 150 MG/ML SUSY INJ INTO THE MUSCLE Q 3 MONTHS 03/26/17   [provider]  metroNIDAZOLE (METROGEL VAGINAL) 0.75 % vaginal gel Place 1 Applicatorful vaginally 2 (two) times daily. 05/30/17   Brock Bad, MD  Multiple Vitamins-Iron (MULTIVITAMINS WITH IRON) TABS tablet Take 1 tablet by mouth daily.    [provider]  predniSONE (DELTASONE) 10 MG tablet Take 2 tablets (20 mg total) by mouth 2 (two) times daily with a meal. 08/04/17   Damian Leavell, [place],  NP  promethazine (PHENERGAN) 25 MG tablet Take 1 tablet (25 mg total) by mouth every 6 (six) hours as needed for nausea or vomiting. Patient not taking: Reported on 05/30/2017 05/27/17   Vanetta Mulders, MD  sertraline (ZOLOFT) 100 MG tablet Take 100 mg by mouth daily. 06/02/17   [provider]    Family History Family History  Problem Relation Age of Onset  . Heart disease Mother   . Cancer Maternal Grandmother        breast  . Cancer Paternal Grandmother        breast    Social History Social History  Substance Use Topics  . Smoking status: Former Smoker    Years: 3.00    Types: Cigars    Quit date: 04/28/2017  . Smokeless tobacco: Never Used     Comment: 1-2 cig./day  . Alcohol use No     Comment: occ     Allergies   Contrast media [iodinated diagnostic agents]; Latuda [lurasidone hcl]; Sulfa antibiotics; Bee venom; Flagyl [metronidazole]; Naproxen; Tramadol; and Toradol [ketorolac tromethamine]   Review of Systems Review of Systems  Constitutional: Negative for chills and fever.  HENT: Negative.   Respiratory: Negative for shortness of breath.   Gastrointestinal: Negative for abdominal pain.  Genitourinary: Negative for dysuria, vaginal bleeding and vaginal discharge. Pelvic pain: chronic.  Musculoskeletal: Positive for arthralgias.       Left hand  Skin: Negative for wound.  Neurological: Negative for headaches.     Physical Exam Updated Vital Signs BP 125/83 (BP Location: Right Arm)   Pulse 71   Temp 98.6 F (37 C) (Oral)   Resp 18   Ht  (1.702 m)   Wt 84.8 kg (187 lb)   SpO2 100%   BMI 29.29 kg/m   Physical Exam  Constitutional: She appears well-developed and well-nourished. No distress.  HENT:  Head: Normocephalic.  Eyes: EOM are normal.  Neck: Neck supple.  Cardiovascular: Normal rate and regular rhythm.   Pulmonary/Chest: Effort normal.  Musculoskeletal:       Left hand: She exhibits decreased range of motion (of thumb due to  pain) and tenderness. She exhibits normal capillary refill, no deformity, no laceration and no swelling. Normal sensation noted.  Pain with palpation and range of motion of the left thumb.   Neurological: She is alert.  Skin: Skin is warm and dry.  Psychiatric: She has a normal mood and affect.  Nursing note and vitals reviewed.    ED Treatments / Results  Labs (all labs ordered are listed, but only abnormal results are displayed) Labs Reviewed  COMPREHENSIVE METABOLIC PANEL - Abnormal; Notable for the following:       Result Value  Calcium 8.5 (*)    Total Protein 6.0 (*)    All other components within normal limits  LIPASE, BLOOD  CBC  URINALYSIS, ROUTINE W REFLEX MICROSCOPIC  I-STAT BETA HCG BLOOD, ED ([facility], [facility], [facility] ONLY)   Radiology No results found.  Procedures Procedures (including critical care time)  Medications Ordered in ED Medications - No data to display   Initial Impression / Assessment and Plan / ED Course  I have reviewed the triage vital signs and the nursing notes. 31 y.o. female with chronic left hand pain and amenorrhea stable for d/c with Bhcg <5. Velcro thumb spica splint applied to the left hand and referral to hand surgeon. Discussed with the patient that her pregnancy hormone level is negative and she will need to f/u with Dr. Clearance Coots for further evaluation of her amenorrhea. Patient agrees with plan.   Final Clinical Impressions(s) / ED Diagnoses   Final diagnoses:  Chronic pain of left thumb  Negative pregnancy test    New Prescriptions Discharge Medication List as of 08/04/2017  5:49 PM    START taking these medications   Details  predniSONE (DELTASONE) 10 MG tablet Take 2 tablets (20 mg total) by mouth 2 (two) times daily with a meal., Starting Sat 08/04/2017, Print         [place], [place], [place] 08/04/17 2151    Lorre Nick, MD 08/09/17 1131

## 2017-08-04 NOTE — Discharge Instructions (Signed)
Call Dr. Gramig'sCarlos Leveringce for follow up for your hand Call Dr. Verdell Carmine office for f/u of you GYN problems.

## 2017-08-04 NOTE — ED Triage Notes (Signed)
Pt comes in with complaints of recurrent left hand pain with swelling.  Received antibiotics and wrist splint and has not had relief.  Pt also states she went off of her depo shot and has not had a period for 2 months.  Has had her tubes tied but was pregnant before after that.

## 2017-08-11 ENCOUNTER — Encounter (HOSPITAL_COMMUNITY): Payer: Self-pay | Admitting: Emergency Medicine

## 2017-08-11 ENCOUNTER — Emergency Department (HOSPITAL_COMMUNITY)
Admission: EM | Admit: 2017-08-11 | Discharge: 2017-08-11 | Disposition: A | Discharge status: Home / Self Care | Payer: Medicaid Other | Attending: Emergency Medicine | Admitting: Emergency Medicine

## 2017-08-11 DIAGNOSIS — Z87891 Personal history of nicotine dependence: Secondary | ICD-10-CM | POA: Insufficient documentation

## 2017-08-11 DIAGNOSIS — F419 Anxiety disorder, unspecified: Secondary | ICD-10-CM | POA: Diagnosis not present

## 2017-08-11 DIAGNOSIS — Z202 Contact with and (suspected) exposure to infections with a predominantly sexual mode of transmission: Secondary | ICD-10-CM | POA: Diagnosis not present

## 2017-08-11 DIAGNOSIS — M79645 Pain in left finger(s): Secondary | ICD-10-CM | POA: Diagnosis present

## 2017-08-11 DIAGNOSIS — Z79899 Other long term (current) drug therapy: Secondary | ICD-10-CM | POA: Diagnosis not present

## 2017-08-11 DIAGNOSIS — N760 Acute vaginitis: Secondary | ICD-10-CM | POA: Diagnosis not present

## 2017-08-11 DIAGNOSIS — F319 Bipolar disorder, unspecified: Secondary | ICD-10-CM | POA: Insufficient documentation

## 2017-08-11 DIAGNOSIS — Z711 Person with feared health complaint in whom no diagnosis is made: Secondary | ICD-10-CM

## 2017-08-11 DIAGNOSIS — B9689 Other specified bacterial agents as the cause of diseases classified elsewhere: Secondary | ICD-10-CM

## 2017-08-11 LAB — COMPREHENSIVE METABOLIC PANEL
ALT: 17 U/L (ref 14–54)
AST: 15 U/L (ref 15–41)
Albumin: 3.3 g/dL — ABNORMAL LOW (ref 3.5–5.0)
Alkaline Phosphatase: 58 U/L (ref 38–126)
Anion gap: 7 (ref 5–15)
BUN: 14 mg/dL (ref 6–20)
CO2: 23 mmol/L (ref 22–32)
Calcium: 8.3 mg/dL — ABNORMAL LOW (ref 8.9–10.3)
Chloride: 111 mmol/L (ref 101–111)
Creatinine, Ser: 0.53 mg/dL (ref 0.44–1.00)
GFR calc Af Amer: >60 mL/min (ref >60)
GFR calc non Af Amer: >60 mL/min (ref >60)
Glucose, Bld: 105 mg/dL — ABNORMAL HIGH (ref 65–99)
Potassium: 3.6 mmol/L (ref 3.5–5.1)
Sodium: 141 mmol/L (ref 135–145)
Total Bilirubin: 0.3 mg/dL (ref 0.3–1.2)
Total Protein: 5.5 g/dL — ABNORMAL LOW (ref 6.5–8.1)

## 2017-08-11 LAB — CBC
HCT: 37.9 % (ref 36.0–46.0)
Hemoglobin: 13.1 g/dL (ref 12.0–15.0)
MCH: 27 pg (ref 26.0–34.0)
MCHC: 34.6 g/dL (ref 30.0–36.0)
MCV: 78.1 fL (ref 78.0–100.0)
Platelets: 234 10*3/uL (ref 150–400)
RBC: 4.85 MIL/uL (ref 3.87–5.11)
RDW: 13.3 % (ref 11.5–15.5)
WBC: 9.8 10*3/uL (ref 4.0–10.5)

## 2017-08-11 LAB — URINALYSIS, ROUTINE W REFLEX MICROSCOPIC
Bilirubin Urine: NEGATIVE
Glucose, UA: NEGATIVE mg/dL
Hgb urine dipstick: NEGATIVE
Ketones, ur: NEGATIVE mg/dL
Leukocytes, UA: NEGATIVE
Nitrite: NEGATIVE
Protein, ur: NEGATIVE mg/dL
Specific Gravity, Urine: 1.027 (ref 1.005–1.030)
pH: 5 (ref 5.0–8.0)

## 2017-08-11 LAB — WET PREP, GENITAL
Sperm: NONE SEEN
Trich, Wet Prep: NONE SEEN
Yeast Wet Prep HPF POC: NONE SEEN

## 2017-08-11 LAB — I-STAT BETA HCG BLOOD, ED (MC, WL, AP ONLY): I-stat hCG, quantitative: <5 m[IU]/mL (ref <5)

## 2017-08-11 LAB — LIPASE, BLOOD: Lipase: 34 U/L (ref 11–51)

## 2017-08-11 MED ORDER — METRONIDAZOLE 500 MG PO TABS: 500.0000 mg | ORAL_TABLET | Freq: Two times a day (BID) | ORAL | 0 refills | Status: AC

## 2017-08-11 MED ORDER — ONDANSETRON 4 MG PO TBDP: 4.0000 mg | ORAL_TABLET | Freq: Three times a day (TID) | ORAL | 0 refills | Status: DC | PRN

## 2017-08-11 MED ORDER — CEFTRIAXONE SODIUM 250 MG IJ SOLR
250.0000 mg | Freq: Once | INTRAMUSCULAR | Status: AC
  Administered 2017-08-11: 250 mg via INTRAMUSCULAR
  Filled 2017-08-11: qty 250

## 2017-08-11 MED ORDER — FENTANYL CITRATE (PF) 100 MCG/2ML IJ SOLN: 50.0000 ug | Freq: Once | INTRAMUSCULAR | Status: DC

## 2017-08-11 MED ORDER — AZITHROMYCIN 250 MG PO TABS
1000.0000 mg | ORAL_TABLET | Freq: Once | ORAL | Status: AC
  Administered 2017-08-11: 1000 mg via ORAL
  Filled 2017-08-11: qty 4

## 2017-08-11 MED ORDER — METHOCARBAMOL 500 MG PO TABS: 500.0000 mg | ORAL_TABLET | Freq: Every evening | ORAL | 0 refills | Status: DC | PRN

## 2017-08-11 MED ORDER — OXYCODONE-ACETAMINOPHEN 5-325 MG PO TABS: 1.0000 | ORAL_TABLET | Freq: Once | ORAL | Status: DC

## 2017-08-11 MED ORDER — DOXYCYCLINE HYCLATE 100 MG PO CAPS: 100.0000 mg | ORAL_CAPSULE | Freq: Two times a day (BID) | ORAL | 0 refills | Status: AC

## 2017-08-11 MED ORDER — FENTANYL CITRATE (PF) 100 MCG/2ML IJ SOLN
50.0000 ug | Freq: Once | INTRAMUSCULAR | Status: AC
  Administered 2017-08-11: 50 ug via INTRAMUSCULAR
  Filled 2017-08-11: qty 2

## 2017-08-11 MED ORDER — LIDOCAINE HCL (PF) 1 % IJ SOLN
0.9000 mL | Freq: Once | INTRAMUSCULAR | Status: AC
  Administered 2017-08-11: 0.9 mL
  Filled 2017-08-11: qty 5

## 2017-08-11 MED ORDER — ACETAMINOPHEN 325 MG PO TABS
650.0000 mg | ORAL_TABLET | Freq: Once | ORAL | Status: AC
  Administered 2017-08-11: 650 mg via ORAL
  Filled 2017-08-11: qty 2

## 2017-08-11 MED ORDER — HYDROCODONE-ACETAMINOPHEN 5-325 MG PO TABS
1.0000 | ORAL_TABLET | Freq: Once | ORAL | Status: AC
  Administered 2017-08-11: 1 via ORAL
  Filled 2017-08-11: qty 1

## 2017-08-11 NOTE — ED Notes (Signed)
ED Provider at bedside. 

## 2017-08-11 NOTE — ED Provider Notes (Signed)
[facility]-EMERGENCY DEPT Provider Note   CSN: [id number] Arrival date & time: 08/11/17  0930     History   Chief Complaint Chief Complaint  Patient presents with  . Hand Problem  . Vaginal Discharge  . Abdominal Pain    HPI Leah Barry is a 31 y.o. female resenting with progressive onset worsening pain in her left thumb shooting up her forearm after an injection from orthopedics 2 days ago. Patient states that she got her hand stuck in a door 2 weeks ago had an x-ray without fracture or dislocation. She was placed in a thumb spica and followed up with orthopedics. Orthopedics diagnosed her with strain and tendonitis. She now is having worsening pain from the injection. She has taken ibuprofen and percocet without relief. No erythema, warmth, swelling, fever, chill.  [id number] She also reports suprapubic to LLQ pain for one week with two days of associated dysuria, mild pruritis and odor. No vaginal discharge. Patient does not have regular cycles due to Depo contraceptive. She reports getting pregnant with ectopic in 2016 after bilateral tubal ligation and has history of unprotected intercourse. Patient has history of ovarian cysts bilaterally.  HPI  Past Medical History:  Diagnosis Date  . Anxiety   . Apnea, sleep    no CPAP use, states lost machine during move to [place]  . Bipolar 1 disorder (HCC)   . Bipolar disorder (HCC)    no current med.  . Depression   . History of MRSA infection 2010  . Irritable bowel syndrome (IBS)    no current med.  . Pelvic inflammatory disease   . Scalp laceration    staples to be removed 03/13/2014  . Ulnar fracture 03/09/2014   left    Patient Active Problem List   Diagnosis Date Noted  . Dysmenorrhea 05/28/2014  . GC (gonococcus) 05/28/2014  . Chlamydia contact 05/28/2014  . Pain 04/14/2014  . UTI symptoms 04/14/2014  . Routine general medical examination at a health care facility 09/26/2013  . Candidiasis of vulva and vagina 09/26/2013    . Unspecified symptom associated with female genital organs 09/10/2013  . Abnormal uterine bleeding (AUB) 09/10/2013  . PID (pelvic inflammatory disease) 07/29/2013  . Dysuria 07/29/2013  . Screening examination for venereal disease 07/29/2013  . Chronic PID (chronic pelvic inflammatory disease) 06/30/2013  . Excessive or frequent menstruation 06/30/2013  . Genital herpes, unspecified 06/30/2013  . Unspecified inflammatory disease of female pelvic organs and tissues 03/12/2013    Past Surgical History:  Procedure Laterality Date  . CYST EXCISION     middle of back  . ORIF ULNAR FRACTURE Left 03/16/2014   Procedure: OPEN REDUCTION INTERNAL FIXATION (ORIF) LEFT ULNAR FRACTURE;  Surgeon: Marlowe Shores, MD;  Location: [facility];  Service: Orthopedics;  Laterality: Left;  left  . OVARIAN CYST REMOVAL    . THYROID CYST EXCISION     nodule exc.  . TUBAL LIGATION      OB History    Gravida Para Term Preterm AB Living   SAB TAB Ectopic Multiple Live Births       1   4       Home Medications    Prior to Admission medications   Medication Sig Start Date End Date Taking? Authorizing Provider  ALPRAZolam Prudy Feeler) 1 MG tablet Take 1 mg by mouth at bedtime as needed for anxiety.    Yes [provider]  gabapentin (NEURONTIN)  300 MG capsule Take 1 capsule (300 mg total) by mouth 3 (three) times daily. 05/30/17  Yes Brock Bad, MD  ibuprofen (ADVIL,MOTRIN) 600 MG tablet Take 1 tablet (600 mg total) by mouth every 6 (six) hours as needed. 07/31/17  Yes Joy, Shawn C, PA-C  metroNIDAZOLE (METROGEL VAGINAL) 0.75 % vaginal gel Place 1 Applicatorful vaginally 2 (two) times daily. 05/30/17  Yes Brock Bad, MD  oxyCODONE-acetaminophen (PERCOCET) 7.5-325 MG tablet Take 1 tablet by mouth as needed. 07/27/17  Yes [provider]  sertraline (ZOLOFT) 100 MG tablet Take 100 mg by mouth daily. 06/02/17  Yes [provider]  doxycycline  (VIBRAMYCIN) 100 MG capsule Take 1 capsule (100 mg total) by mouth 2 (two) times daily. 08/11/17 08/25/17  Georgiana Shore, PA-C  HYDROcodone-acetaminophen (NORCO/VICODIN) 5-325 MG tablet Take 1-2 tablets by mouth every 6 (six) hours as needed for severe pain. Patient not taking: Reported on 08/11/2017 07/31/17   Joy, Ines Bloomer C, PA-C  MedroxyPROGESTERone Acetate 150 MG/ML SUSY INJ INTO THE MUSCLE Q 3 MONTHS 03/26/17   [provider]  methocarbamol (ROBAXIN) 500 MG tablet Take 1 tablet (500 mg total) by mouth at bedtime as needed for muscle spasms. 08/11/17   Georgiana Shore, PA-C  metroNIDAZOLE (FLAGYL) 500 MG tablet Take 1 tablet (500 mg total) by mouth 2 (two) times daily. 08/11/17 08/18/17  Mathews Robinsons B, PA-C  ondansetron (ZOFRAN ODT) 4 MG disintegrating tablet Take 1 tablet (4 mg total) by mouth every 8 (eight) hours as needed for nausea or vomiting. 08/11/17   Mathews Robinsons B, PA-C  predniSONE (DELTASONE) 10 MG tablet Take 2 tablets (20 mg total) by mouth 2 (two) times daily with a meal. Patient not taking: Reported on 08/11/2017 08/04/17   Janne Napoleon, NP  promethazine (PHENERGAN) 25 MG tablet Take 1 tablet (25 mg total) by mouth every 6 (six) hours as needed for nausea or vomiting. Patient not taking: Reported on 05/30/2017 05/27/17   Vanetta Mulders, MD    Family History Family History  Problem Relation Age of Onset  . Heart disease Mother   . Cancer Maternal Grandmother        breast  . Cancer Paternal Grandmother        breast    Social History Social History  Substance Use Topics  . Smoking status: Former Smoker    Years: 3.00    Types: Cigars    Quit date: 04/28/2017  . Smokeless tobacco: Never Used     Comment: 1-2 cig./day  . Alcohol use No     Comment: occ     Allergies   Latuda [lurasidone hcl]; Sulfa antibiotics; Bee venom; Flagyl [metronidazole]; Naproxen; Tramadol; and Toradol [ketorolac tromethamine]   Review of Systems Review of Systems    Constitutional: Negative for chills and fever.  Respiratory: Negative for cough, shortness of breath, wheezing and stridor.   Cardiovascular: Negative for chest pain and palpitations.  Gastrointestinal: Positive for abdominal pain. Negative for nausea and vomiting.       Generalized abdominal discomfort  Genitourinary: Positive for dysuria, frequency and pelvic pain. Negative for difficulty urinating, flank pain, genital sores, hematuria, vaginal bleeding and vaginal discharge.  Musculoskeletal: Positive for arthralgias and myalgias. Negative for back pain and joint swelling.  Skin: Negative for color change, pallor and rash.  Neurological: Positive for numbness. Negative for seizures, syncope and weakness.       Reports more of a pain to her finger tips than numbness.  Physical Exam Updated Vital Signs BP (!) 117/102   Pulse 75   Temp (!) 97.5 F (36.4 C) (Oral)   Resp 16   LMP 04/10/2017   SpO2 98%   Physical Exam  Constitutional: She appears well-developed and well-nourished. No distress.  Afebrile, nontoxic-appearing, sitting comfortably in bed in no acute distress.  HENT:  Head: Normocephalic and atraumatic.  Eyes: Conjunctivae are normal.  Neck: Neck supple.  Cardiovascular: Normal rate, regular rhythm, normal heart sounds and intact distal pulses.   No murmur heard. Strong radial pulses  Pulmonary/Chest: Effort normal and breath sounds normal. No respiratory distress.  Abdominal: Soft. She exhibits no distension. There is tenderness.  Left lower quadrant tenderness with inconsistent exam when distracted.  Genitourinary: Vaginal discharge found.  Genitourinary Comments: Cervix is friable, thick cervical discharge. cervical motion tenderness  Musculoskeletal: Normal range of motion. She exhibits tenderness. She exhibits no edema or deformity.  Patient exhibits snuffbox tenderness, no swelling or erythema. 5 out of 5 strength to resisted flexion and extension of all  fingers. Patient is having difficulty with flexion at the MCP of the index and middle finger. Full passive range of motion. Sensation intact  Neurological: She is alert. No sensory deficit.  Skin: Skin is warm and dry. Capillary refill takes less than 2 seconds. No rash noted. She is not diaphoretic. No erythema. No pallor.  Psychiatric: She has a normal mood and affect.  Nursing note and vitals reviewed.    ED Treatments / Results  Labs (all labs ordered are listed, but only abnormal results are displayed) Labs Reviewed  WET PREP, GENITAL - Abnormal; Notable for the following:       Result Value   Clue Cells Wet Prep HPF POC PRESENT (*)    WBC, Wet Prep HPF POC FEW (*)    All other components within normal limits  COMPREHENSIVE METABOLIC PANEL - Abnormal; Notable for the following:    Glucose, Bld 105 (*)    Calcium 8.3 (*)    Total Protein 5.5 (*)    Albumin 3.3 (*)    All other components within normal limits  LIPASE, BLOOD  CBC  URINALYSIS, ROUTINE W REFLEX MICROSCOPIC  RPR  HIV ANTIBODY (ROUTINE TESTING)  I-STAT BETA HCG BLOOD, ED ([facility], [facility], [facility] ONLY)  GC/CHLAMYDIA PROBE AMP ([place]) NOT AT [facility]    EKG  EKG Interpretation None       Radiology No results found.  Procedures Procedures (including critical care time)  Medications Ordered in ED Medications  HYDROcodone-acetaminophen (NORCO/VICODIN) 5-325 MG per tablet 1 tablet (1 tablet Oral Given 08/11/17 1544)  acetaminophen (TYLENOL) tablet 650 mg (650 mg Oral Given 08/11/17 1544)  cefTRIAXone (ROCEPHIN) injection 250 mg (250 mg Intramuscular Given 08/11/17 1651)  azithromycin (ZITHROMAX) tablet 1,000 mg (1,000 mg Oral Given 08/11/17 1651)  lidocaine (PF) (XYLOCAINE) 1 % injection 0.9 mL (0.9 mLs Other Given 08/11/17 1651)  fentaNYL (SUBLIMAZE) injection 50 mcg (50 mcg Intramuscular Given 08/11/17 1651)     Initial Impression / Assessment and Plan / ED Course  I have reviewed the triage vital signs and the  nursing notes.  Pertinent labs & imaging results that were available during my care of the patient were reviewed by me and considered in my medical decision making (see chart for details).     Patient presenting with multiple complaints. Patient has had multiple visits with similar complaints. Her symptoms appear mild and have low suspicion for torsion given history and physical exam. More likely  to be infectious. Patient with history of PID and BV.  Patient's symptoms improved after removing the splint and she recovered full rom and sensation. Pain managed in ED.  Advised to continue with current pain management regimen elevation and splint. Advised to loosen remove splint if experiencing difficulty moving her fingers or numbness. Will discharge with a muscle relaxer for spasm in the forearm. F/u with hand  Pelvic with discharge, friability and cervical motion tenderness. Treated in ED with ceftriaxone and azithromycin. Wet prep with evidence of BV, will add flagyl. Advised to avoid alcohol with this medication.  Urged patient to follow-up with Ortho and GYN.  Advised to avoid sexual intercourse for at least 1 week and until symptoms resolve. She is aware that partner will have to be treated if results are positive.  Discussed strict return precautions and advised to return to the emergency department if experiencing any new or worsening symptoms. Instructions were understood and patient agreed with discharge plan.  Transferred patient care at end of shift to Shirlyn Goltz PA-C pending pregnancy result. Discharge home if negative with said plan.  Ultrasound appropriate treatment if positive to rule out ectopic.  Final Clinical Impressions(s) / ED Diagnoses   Final diagnoses:  Concern about STD in female without diagnosis  BV (bacterial vaginosis)    New Prescriptions New Prescriptions   DOXYCYCLINE (VIBRAMYCIN) 100 MG CAPSULE    Take 1 capsule (100 mg total) by mouth 2 (two) times  daily.   METHOCARBAMOL (ROBAXIN) 500 MG TABLET    Take 1 tablet (500 mg total) by mouth at bedtime as needed for muscle spasms.   METRONIDAZOLE (FLAGYL) 500 MG TABLET    Take 1 tablet (500 mg total) by mouth 2 (two) times daily.   ONDANSETRON (ZOFRAN ODT) 4 MG DISINTEGRATING TABLET    Take 1 tablet (4 mg total) by mouth every 8 (eight) hours as needed for nausea or vomiting.     Gregary Cromer 08/11/17 Arlice Colt, MD 08/12/17 [phone]

## 2017-08-11 NOTE — ED Provider Notes (Signed)
Assumed care from PA [facility] at shift change. Patient's pregnancy test is negative. Given this, do not suspect ectopic pregnancy and will not proceed with transvaginal ultrasound. Spoke with patient about her results and she states that she is feeling well and is ready to leave. Discussed return precautions and patient voices understanding and agrees at bedside. Vital signs stable and patient is ready for discharge.  Blood pressure (!) 117/102, pulse 75, temperature (!) 97.5 F (36.4 C), temperature source Oral, resp. rate 16, last menstrual period 04/10/2017, SpO2 98 %.     Kellie Shropshire, PA-C 08/11/17 1811

## 2017-08-11 NOTE — Discharge Instructions (Signed)
As discussed, continue with your current pain regimen and take muscle relaxer as needed but remembers not to drive or operate machinery while taking this medication.  Call your orthopedic specialists for follow-up. Follow up with GYN  Taking entire course of antibiotics even if you feels better. Avoid alcohol while taking antibiotics.  Return if you experience severe abdominal pain worsening, nausea, vomiting, diarrhea, fever,swelling, redness, warmth or other concerning symptoms in the meantime.

## 2017-08-11 NOTE — ED Notes (Signed)
Pt refusing to leave pulse ox and blood pressure monitoring devices on

## 2017-08-11 NOTE — ED Notes (Signed)
PA [place] in to speak with patient

## 2017-08-11 NOTE — ED Notes (Signed)
Pt c/o nausea, requesting sprite

## 2017-08-11 NOTE — ED Triage Notes (Signed)
Pt. Stated, I have this left hand pain and for 2 days I've had stomach pain and now Im having vaginal itching and burning for 2 days.

## 2017-08-11 NOTE — ED Notes (Signed)
Pt made aware of medications that were ordered for her. Pt stating "I normally take 7 of oxycodone so I don't know why the doctor is only giving me 5 of hydrocodone, that is ridiculous." this RN advised I would have the MD come speak with her if she would like, pt decline and stated "I didn't like that doctor anyway for making me wait so long for some pain medications, I just want to speak to whoever is in charge her." this RN advised I would let the charge RN know of her complaint. Katrinka Blazing, Charge RN made aware.

## 2017-08-12 ENCOUNTER — Emergency Department (HOSPITAL_COMMUNITY): Payer: Medicaid Other

## 2017-08-12 ENCOUNTER — Encounter (HOSPITAL_COMMUNITY): Payer: Self-pay

## 2017-08-12 ENCOUNTER — Emergency Department (HOSPITAL_COMMUNITY)
Admission: EM | Admit: 2017-08-12 | Discharge: 2017-08-13 | Disposition: A | Discharge status: Home / Self Care | Payer: Medicaid Other | Attending: Emergency Medicine | Admitting: Emergency Medicine

## 2017-08-12 DIAGNOSIS — R1032 Left lower quadrant pain: Secondary | ICD-10-CM | POA: Insufficient documentation

## 2017-08-12 DIAGNOSIS — R1030 Lower abdominal pain, unspecified: Secondary | ICD-10-CM

## 2017-08-12 DIAGNOSIS — Z87891 Personal history of nicotine dependence: Secondary | ICD-10-CM | POA: Insufficient documentation

## 2017-08-12 DIAGNOSIS — Z79899 Other long term (current) drug therapy: Secondary | ICD-10-CM | POA: Insufficient documentation

## 2017-08-12 DIAGNOSIS — R300 Dysuria: Secondary | ICD-10-CM | POA: Diagnosis present

## 2017-08-12 LAB — URINALYSIS, ROUTINE W REFLEX MICROSCOPIC
Bilirubin Urine: NEGATIVE
Glucose, UA: NEGATIVE mg/dL
Hgb urine dipstick: NEGATIVE
Ketones, ur: NEGATIVE mg/dL
Leukocytes, UA: NEGATIVE
Nitrite: NEGATIVE
Protein, ur: NEGATIVE mg/dL
Specific Gravity, Urine: 1.017 (ref 1.005–1.030)
pH: 6 (ref 5.0–8.0)

## 2017-08-12 LAB — HIV ANTIBODY (ROUTINE TESTING W REFLEX): HIV Screen 4th Generation wRfx: NONREACTIVE

## 2017-08-12 LAB — RPR: RPR Ser Ql: NONREACTIVE

## 2017-08-12 MED ORDER — ONDANSETRON HCL 4 MG/2ML IJ SOLN
4.0000 mg | Freq: Once | INTRAMUSCULAR | Status: AC
  Administered 2017-08-12: 4 mg via INTRAVENOUS
  Filled 2017-08-12: qty 2

## 2017-08-12 MED ORDER — IBUPROFEN 400 MG PO TABS
600.0000 mg | ORAL_TABLET | Freq: Once | ORAL | Status: AC
  Administered 2017-08-12: 600 mg via ORAL
  Filled 2017-08-12: qty 1

## 2017-08-12 MED ORDER — DICYCLOMINE HCL 10 MG/ML IM SOLN
20.0000 mg | Freq: Once | INTRAMUSCULAR | Status: AC
  Administered 2017-08-12: 20 mg via INTRAMUSCULAR
  Filled 2017-08-12: qty 2

## 2017-08-12 NOTE — ED Triage Notes (Signed)
Pt presents for evaluation of worsening dysuria and abd pain since yesterday. States was treated for STD yesterday and told cervix was inflammed and told to come back if it got worse. Pt reports unable to walk after urinating sometimes. Very frustrated that she did not receive an ultrasound yesterday.

## 2017-08-12 NOTE — ED Provider Notes (Signed)
[facility]-EM[facility]ENCY DEPT Provider Note   CSN: [id number] Arrival date & time: 08/12/17  1700     History   Chief Complaint Chief Complaint  Patient presents with  . Dysuria    HPI Leah Barry is a 31 y.o. female.  HPI  Patient is a 31 year old female with a history of PID, pelvic congestion syndrome presenting for left-sided abdominal pain and dysuria. Patient reports that she was seen on 08-11-2017 for a hand pain and lower abdominal pain and dysuria. Full PID workup initiated at that time, and appropriate STI testing performed. Patient reports that her pain has gotten worse overnight and is currently a 9 out of 10 and is in the left side of her abdomen. This pain has been present for a week, but has worsened today. Patient reports that this pain has been intermittent for a week. Patient reports that she has been nauseous which is not new for her but she has not vomited. Patient denies any fever, chills, or diarrhea. No hematochezia or melena. Patient reports that she has had burning vaginal pain even without urination over the interval. No vaginal bleeding. Patient reports irregular bleeding and amenorrhea for 2 months since ending her Depo shots. Patient is expressing concern that she should receive pelvic ultrasound due to history of complicated PID.  On chart review, patient was diagnosed with bacterial vaginosis and given prescription for Flagyl. Patient reports that she is unable to take Flagyl due to an allergy, however she can take Flagyl gel and has been treating her bacterial vaginosis in this manner.  Past Medical History:  Diagnosis Date  . Anxiety   . Apnea, sleep    no CPAP use, states lost machine during move to [place]  . Bipolar 1 disorder (HCC)   . Bipolar disorder (HCC)    no current med.  . Depression   . History of MRSA infection 2010  . Irritable bowel syndrome (IBS)    no current med.  . Pelvic inflammatory disease   . Scalp laceration    staples to be removed  03/13/2014  . Ulnar fracture 03/09/2014   left    Patient Active Problem List   Diagnosis Date Noted  . Dysmenorrhea 05/28/2014  . GC (gonococcus) 05/28/2014  . Chlamydia contact 05/28/2014  . Pain 04/14/2014  . UTI symptoms 04/14/2014  . Routine general medical examination at a health care facility 09/26/2013  . Candidiasis of vulva and vagina 09/26/2013  . Unspecified symptom associated with female genital organs 09/10/2013  . Abnormal uterine bleeding (AUB) 09/10/2013  . PID (pelvic inflammatory disease) 07/29/2013  . Dysuria 07/29/2013  . Screening examination for venereal disease 07/29/2013  . Chronic PID (chronic pelvic inflammatory disease) 06/30/2013  . Excessive or frequent menstruation 06/30/2013  . Genital herpes, unspecified 06/30/2013  . Unspecified inflammatory disease of female pelvic organs and tissues 03/12/2013    Past Surgical History:  Procedure Laterality Date  . CYST EXCISION     middle of back  . ORIF ULNAR FRACTURE Left 03/16/2014   Procedure: OPEN REDUCTION INTERNAL FIXATION (ORIF) LEFT ULNAR FRACTURE;  Surgeon: Marlowe Shores, MD;  Location: [facility];  Service: Orthopedics;  Laterality: Left;  left  . OVARIAN CYST REMOVAL    . THYROID CYST EXCISION     nodule exc.  . TUBAL LIGATION      OB History    Gravida Para Term Preterm AB Living   SAB TAB Ectopic  Multiple Live Births       1   4       Home Medications    Prior to Admission medications   Medication Sig Start Date End Date Taking? Authorizing Provider  ALPRAZolam Prudy Feeler) 1 MG tablet Take 1 mg by mouth at bedtime as needed for anxiety.     [provider]  doxycycline (VIBRAMYCIN) 100 MG capsule Take 1 capsule (100 mg total) by mouth 2 (two) times daily. 08/11/17 08/25/17  Mathews Robinsons B, PA-C  gabapentin (NEURONTIN) 300 MG capsule Take 1 capsule (300 mg total) by mouth 3 (three) times daily. 05/30/17   Brock Bad, MD    HYDROcodone-acetaminophen (NORCO/VICODIN) 5-325 MG tablet Take 1-2 tablets by mouth every 6 (six) hours as needed for severe pain. Patient not taking: Reported on 08/11/2017 07/31/17   Anselm Pancoast, PA-C  ibuprofen (ADVIL,MOTRIN) 600 MG tablet Take 1 tablet (600 mg total) by mouth every 6 (six) hours as needed. 07/31/17   Joy, Shawn C, PA-C  MedroxyPROGESTERone Acetate 150 MG/ML SUSY INJ INTO THE MUSCLE Q 3 MONTHS 03/26/17   [provider]  methocarbamol (ROBAXIN) 500 MG tablet Take 1 tablet (500 mg total) by mouth at bedtime as needed for muscle spasms. 08/11/17   Georgiana Shore, PA-C  metroNIDAZOLE (FLAGYL) 500 MG tablet Take 1 tablet (500 mg total) by mouth 2 (two) times daily. 08/11/17 08/18/17  Mathews Robinsons B, PA-C  metroNIDAZOLE (METROGEL VAGINAL) 0.75 % vaginal gel Place 1 Applicatorful vaginally 2 (two) times daily. 05/30/17   Brock Bad, MD  ondansetron (ZOFRAN ODT) 4 MG disintegrating tablet Take 1 tablet (4 mg total) by mouth every 8 (eight) hours as needed for nausea or vomiting. 08/11/17   Georgiana Shore, PA-C  oxyCODONE-acetaminophen (PERCOCET) 7.5-325 MG tablet Take 1 tablet by mouth as needed. 07/27/17   [provider]  predniSONE (DELTASONE) 10 MG tablet Take 2 tablets (20 mg total) by mouth 2 (two) times daily with a meal. Patient not taking: Reported on 08/11/2017 08/04/17   Janne Napoleon, NP  promethazine (PHENERGAN) 25 MG tablet Take 1 tablet (25 mg total) by mouth every 6 (six) hours as needed for nausea or vomiting. Patient not taking: Reported on 05/30/2017 05/27/17   Vanetta Mulders, MD  sertraline (ZOLOFT) 100 MG tablet Take 100 mg by mouth daily. 06/02/17   [provider]    Family History Family History  Problem Relation Age of Onset  . Heart disease Mother   . Cancer Maternal Grandmother        breast  . Cancer Paternal Grandmother        breast    Social History Social History  Substance Use Topics  . Smoking status:  Former Smoker    Years: 3.00    Types: Cigars    Quit date: 04/28/2017  . Smokeless tobacco: Never Used     Comment: 1-2 cig./day  . Alcohol use No     Comment: occ     Allergies   Latuda [lurasidone hcl]; Sulfa antibiotics; Bee venom; Flagyl [metronidazole]; Naproxen; Tramadol; and Toradol [ketorolac tromethamine]   Review of Systems Review of Systems  Constitutional: Negative for chills and fever.  HENT: Positive for sore throat. Negative for congestion, rhinorrhea and sinus pain.   Eyes: Negative for visual disturbance.  Respiratory: Negative for cough and shortness of breath.   Cardiovascular: Negative for chest pain and leg swelling.  Gastrointestinal: Positive for abdominal pain and nausea. Negative for constipation,  diarrhea and vomiting.  Genitourinary: Positive for dysuria, flank pain, pelvic pain, vaginal discharge and vaginal pain. Negative for vaginal bleeding.  Musculoskeletal: Negative for back pain and myalgias.  Skin: Negative for rash.  Neurological: Positive for headaches. Negative for dizziness and light-headedness.     Physical Exam Updated Vital Signs BP 124/89   Pulse 76   Temp 98.6 F (37 C) (Oral)   Resp 18   Ht  (1.702 m)   Wt 87.1 kg (192 lb)   LMP  (LMP Unknown)   SpO2 99%   BMI 30.07 kg/m   Physical Exam  Constitutional: She appears well-developed and well-nourished. No distress.  HENT:  Head: Normocephalic and atraumatic.  Mouth/Throat: Oropharynx is clear and moist.  Eyes: Pupils are equal, round, and reactive to light. Conjunctivae and EOM are normal.  Neck: Normal range of motion. Neck supple.  Cardiovascular: Normal rate, regular rhythm, S1 normal and S2 normal.   No murmur heard. Pulmonary/Chest: Effort normal and breath sounds normal. She has no wheezes. She has no rales.  Abdominal: Soft. She exhibits no distension. There is tenderness. There is no guarding.  Pain to palpation of periumbilical to left lower quadrant and  extending approximately halfway up left side of the abdomen. No guarding. Discomfort of left flank when assessing CVA tenderness. No suprapubic tenderness.   Musculoskeletal: Normal range of motion. She exhibits no edema or deformity.  Lymphadenopathy:    She has no cervical adenopathy.  Neurological: She is alert.  Cranial nerves grossly intact. Patient moves extremities with good coordination.  Skin: Skin is warm and dry. No rash noted. No erythema.  Psychiatric: She has a normal mood and affect. Her behavior is normal. Judgment and thought content normal.  Nursing note and vitals reviewed.    ED Treatments / Results  Labs (all labs ordered are listed, but only abnormal results are displayed) Labs Reviewed  URINALYSIS, ROUTINE W REFLEX MICROSCOPIC - Abnormal; Notable for the following:       Result Value   Color, Urine STRAW (*)    All other components within normal limits    EKG  EKG Interpretation None       Radiology No results found.  Procedures Procedures (including critical care time)  Medications Ordered in ED Medications  ibuprofen (ADVIL,MOTRIN) tablet 600 mg (not administered)  ondansetron (ZOFRAN) injection 4 mg (not administered)     Initial Impression / Assessment and Plan / ED Course  I have reviewed the triage vital signs and the nursing notes.  Pertinent labs & imaging results that were available during my care of the patient were reviewed by me and considered in my medical decision making (see chart for details).     Final Clinical Impressions(s) / ED Diagnoses   Final diagnoses:  None   MDM  Differential diagnosis for LLQ pain includes TOA, diverticulitis, ovarian cyst, ovarian torsion, pelvic congestion syndrome, bacterial vaginosis, interstitial cystitis. Doubt diverticulitis, as patient had normal white count at initial evaluation at the onset of this pain, has had no changes in appetite, and bowel movements normal without melena,  hematochezia, diarrhea, or constipation. Transvaginal ultrasound results reassuring today showing no evidence of TOA, ovarian torsion, ruptured ovarian cyst. No adnexal masses and normal follicular and physiologic cysts noted. Pain controlled with ibuprofen and Bentyl and emergency department stay. Patient was well-appearing and tolerating by mouth intake on the close of visit. Patient instructed to follow-up with her gynecologist, as well as a urologist for the continued dysuria  in the absence of urinary tract infection if not improving. Will continue to treat possible PID and bacterial vaginosis. Changed prescription to intravaginal clindamycin for arterial vaginosis. Patient given return precautions for any worsening lower abdominal pain, fever or chills, inability to tolerate by mouth intake, or blood in bowel movements. Patient is in understanding and agrees with the plan of care.  This is a supervised visit with Dr. Alvira Monday. Evaluation, management, and discharge planning discussed with this attending physician.  New Prescriptions New Prescriptions   No medications on file     Delia Chimes 08/13/17 0241    Alvira Monday, MD 08/13/17 1353

## 2017-08-13 LAB — GC/CHLAMYDIA PROBE AMP (~~LOC~~) NOT AT ARMC
Chlamydia: NEGATIVE
Neisseria Gonorrhea: NEGATIVE

## 2017-08-13 MED ORDER — CLINDAMYCIN PHOSPHATE 2 % VA CREA: 1.0000 | TOPICAL_CREAM | Freq: Every day | VAGINAL | 0 refills | Status: DC

## 2017-08-13 MED ORDER — DICYCLOMINE HCL 20 MG PO TABS: 20.0000 mg | ORAL_TABLET | Freq: Three times a day (TID) | ORAL | 0 refills | Status: DC

## 2017-08-13 NOTE — ED Notes (Signed)
Pt inquiring as to getting a way home. Informed pt she could wait in lobby until bus started running around 0500, pt stated she wanted to speak w/ Consulting civil engineer. Clydie Braun, ED Charge RN made aware.

## 2017-08-13 NOTE — Discharge Instructions (Addendum)
Please see the information and instructions below regarding your visit.  Your diagnoses today include:  1. Dysuria   2. Lower abdominal pain    Your testing was reassuring today that there is no abscess or inflammation in your tubes that may be causing your pain. Please continue to take the antibiotic you are given, doxycycline, to continue treating any pelvic infection.  Tests performed today include: See side panel of your discharge paperwork for testing performed today. Vital signs are listed at the bottom of these instructions.   -Transvaginal ultrasound -Urine-normal today  Medications prescribed:    Take any prescribed medications only as prescribed, and any over the counter medications only as directed on the packaging.  1. Please use the clindamycin cream intravaginally once a day for 7 days.  2. You may take Bentyl up to 4 times a day after meals and before bedtime for your abdominal pain.  Home care instructions:  Please follow any educational materials contained in this packet.   Follow-up instructions: Please follow-up with your primary care provider for further evaluation of your symptoms if they are not completely improved.   Please follow up with the urologist listed in your paperwork.  Please return to your gynecologist to workup your lack of period.  Return instructions:  Please return to the Emergency Department if you experience worsening symptoms.  Please return to the emergency department for any worsening pain, nausea or vomiting that prevents she from keeping food or drink down, fever or chills in the setting of your abdominal pain, belly pain that is worsening or moving around the abdomen. Please return if you have any other emergent concerns.  Additional Information:   Your vital signs today were: BP 111/74    Pulse 62    Temp 98.6 F (37 C) (Oral)    Resp 18    Ht  (1.702 m)    Wt 87.1 kg (192 lb)    LMP  (LMP Unknown)    SpO2 99%    BMI 30.07  kg/m  If your blood pressure (BP) was elevated on multiple readings during this visit above 130 for the top number or above 80 for the bottom number, please have this repeated by your primary care provider within one month. --------------  Thank you for allowing [place] to participate in your care today.

## 2017-08-14 ENCOUNTER — Telehealth: Payer: Self-pay

## 2017-08-14 NOTE — Telephone Encounter (Signed)
Patient called in stating that she went to hospital yesterday for std check and was given antibiotics. Patient wanted to know std results.

## 2017-08-14 NOTE — Telephone Encounter (Signed)
Returned call, no answer, left vm 

## 2017-08-22 ENCOUNTER — Encounter: Payer: Self-pay | Admitting: Obstetrics

## 2017-08-22 ENCOUNTER — Ambulatory Visit (INDEPENDENT_AMBULATORY_CARE_PROVIDER_SITE_OTHER): Payer: Medicaid Other | Admitting: Obstetrics

## 2017-08-22 VITALS — BP 137/87 | HR 78 | Temp 98.1°F | Wt 189.9 lb

## 2017-08-22 DIAGNOSIS — Z3202 Encounter for pregnancy test, result negative: Secondary | ICD-10-CM

## 2017-08-22 DIAGNOSIS — N944 Primary dysmenorrhea: Secondary | ICD-10-CM | POA: Diagnosis not present

## 2017-08-22 DIAGNOSIS — N760 Acute vaginitis: Secondary | ICD-10-CM

## 2017-08-22 DIAGNOSIS — F418 Other specified anxiety disorders: Secondary | ICD-10-CM

## 2017-08-22 DIAGNOSIS — Z3042 Encounter for surveillance of injectable contraceptive: Secondary | ICD-10-CM

## 2017-08-22 DIAGNOSIS — R4589 Other symptoms and signs involving emotional state: Secondary | ICD-10-CM

## 2017-08-22 DIAGNOSIS — B9689 Other specified bacterial agents as the cause of diseases classified elsewhere: Secondary | ICD-10-CM

## 2017-08-22 LAB — POCT URINE PREGNANCY: Preg Test, Ur: NEGATIVE

## 2017-08-22 MED ORDER — IBUPROFEN 800 MG PO TABS: 800.0000 mg | ORAL_TABLET | Freq: Three times a day (TID) | ORAL | 5 refills | Status: DC | PRN

## 2017-08-22 NOTE — Addendum Note (Signed)
Addended by: Maretta Bees on: 08/22/2017 04:15 PM   Modules accepted: Orders

## 2017-08-22 NOTE — Progress Notes (Signed)
Presents for pain in vagina and abdomen 8/10. Complains of burning and itching for 2 weeks, she spotted one day. Metrogel is not working. Was checked for STD at the [facility] 08/11/17.

## 2017-08-22 NOTE — Progress Notes (Signed)
Patient ID: Leah Barry, female   DOB: [birth year], 31 y.o.   MRN: [id number]  Chief Complaint  Patient presents with  . Follow-up    FU [place]     HPI Leah Barry is a 31 y.o. female.  Vaginal and abdominal pain, like period is coming on.  On Depo Provera for dysmenorrhea, and missed her last injection.  Has history of recurrent BV, on MetroGel, chronic therapy.  HPI  Past Medical History:  Diagnosis Date  . Anxiety   . Apnea, sleep    no CPAP use, states lost machine during move to [place]  . Bipolar 1 disorder (HCC)   . Bipolar disorder (HCC)    no current med.  . Depression   . History of MRSA infection 2010  . Irritable bowel syndrome (IBS)    no current med.  . Pelvic inflammatory disease   . Scalp laceration    staples to be removed 03/13/2014  . Ulnar fracture 03/09/2014   left    Past Surgical History:  Procedure Laterality Date  . CYST EXCISION     middle of back  . ORIF ULNAR FRACTURE Left 03/16/2014   Procedure: OPEN REDUCTION INTERNAL FIXATION (ORIF) LEFT ULNAR FRACTURE;  Surgeon: Marlowe Shores, MD;  Location: [facility];  Service: Orthopedics;  Laterality: Left;  left  . OVARIAN CYST REMOVAL    . THYROID CYST EXCISION     nodule exc.  . TUBAL LIGATION      Family History  Problem Relation Age of Onset  . Heart disease Mother   . Cancer Maternal Grandmother        breast  . Cancer Paternal Grandmother        breast    Social History Social History  Substance Use Topics  . Smoking status: Former Smoker    Years: 3.00    Types: Cigars    Quit date: 04/28/2017  . Smokeless tobacco: Never Used     Comment: 1-2 cig./day  . Alcohol use No     Comment: occ    Allergies  Allergen Reactions  . Latuda [Lurasidone Hcl] Palpitations and Other (See Comments)    Hot flashes, tremors  . Sulfa Antibiotics Swelling    SWELLING OF EYES  . Bee Venom Swelling    "Bees make my eyes swell."  . Flagyl [Metronidazole] Nausea And Vomiting     tablets causes reaction Topical is ok  . Naproxen Other (See Comments)    NOSE BLEED  . Tramadol Itching and Nausea And Vomiting  . Toradol [Ketorolac Tromethamine] Itching    Able to take Ibuprofen    Current Outpatient Prescriptions  Medication Sig Dispense Refill  . ALPRAZolam (XANAX) 1 MG tablet Take 1 mg by mouth at bedtime as needed for anxiety.     . clindamycin (CLEOCIN) 2 % vaginal cream Place 1 Applicatorful vaginally at bedtime. 40 g 0  . dicyclomine (BENTYL) 20 MG tablet Take 1 tablet (20 mg total) by mouth 4 (four) times daily -  before meals and at bedtime. 40 tablet 0  . doxycycline (VIBRAMYCIN) 100 MG capsule Take 1 capsule (100 mg total) by mouth 2 (two) times daily. (Patient taking differently: Take 100 mg by mouth 2 (two) times daily. 14 day course started 08/12/17 am) 28 capsule 0  . gabapentin (NEURONTIN) 300 MG capsule Take 1 capsule (300 mg total) by mouth 3 (three) times daily. 90 capsule 5  . HYDROcodone-acetaminophen (NORCO/VICODIN) 5-325 MG tablet Take 1-2  tablets by mouth every 6 (six) hours as needed for severe pain. (Patient not taking: Reported on 08/11/2017) 10 tablet 0  . ibuprofen (ADVIL,MOTRIN) 600 MG tablet Take 1 tablet (600 mg total) by mouth every 6 (six) hours as needed. (Patient taking differently: Take 600 mg by mouth every 6 (six) hours as needed for headache (pain). ) 30 tablet 0  . ibuprofen (ADVIL,MOTRIN) 800 MG tablet Take 1 tablet (800 mg total) by mouth every 8 (eight) hours as needed. 30 tablet 5  . methocarbamol (ROBAXIN) 500 MG tablet Take 1 tablet (500 mg total) by mouth at bedtime as needed for muscle spasms. 12 tablet 0  . metroNIDAZOLE (METROGEL VAGINAL) 0.75 % vaginal gel Place 1 Applicatorful vaginally 2 (two) times daily. 70 g 6  . ondansetron (ZOFRAN ODT) 4 MG disintegrating tablet Take 1 tablet (4 mg total) by mouth every 8 (eight) hours as needed for nausea or vomiting. 10 tablet 0  . oxyCODONE-acetaminophen (PERCOCET) 7.5-325 MG  tablet Take 1 tablet by mouth 3 (three) times daily as needed (pain).   0  . promethazine (PHENERGAN) 25 MG tablet Take 1 tablet (25 mg total) by mouth every 6 (six) hours as needed for nausea or vomiting. (Patient not taking: Reported on 05/30/2017) 10 tablet 1  . sertraline (ZOLOFT) 100 MG tablet Take 100 mg by mouth daily.  0   No current facility-administered medications for this visit.     Review of Systems Review of Systems Constitutional: negative for fatigue and weight loss Respiratory: negative for cough and wheezing Cardiovascular: negative for chest pain, fatigue and palpitations Gastrointestinal: negative for abdominal pain and change in bowel habits Genitourinary:positive for pelvic pain and malodorous vaginal discharge Integument/breast: negative for nipple discharge Musculoskeletal:negative for myalgias Neurological: negative for gait problems and tremors Behavioral/Psych: negative for abusive relationship, depression Endocrine: negative for temperature intolerance      Blood pressure 137/87, pulse 78, temperature 98.1 F (36.7 C), weight 189 lb 14.4 oz (86.1 kg).  Physical Exam Physical Exam           General:  Alert and no distress Abdomen:  normal findings: no organomegaly, soft, non-tender and no hernia  Pelvis:  External genitalia: normal general appearance Urinary system: urethral meatus normal and bladder without fullness, nontender Vaginal: normal without tenderness, induration or masses Cervix: normal appearance Adnexa: normal bimanual exam Uterus: anteverted and mildly tender, normal size    50% of 15 min visit spent on counseling and coordination of care.    Data Reviewed Labs: positive for BV on wet prep Ultrasound:  WNL's  Assessment     1. Primary dysmenorrhea Rx: - ibuprofen (ADVIL,MOTRIN) 800 MG tablet; Take 1 tablet (800 mg total) by mouth every 8 (eight) hours as needed.  Dispense: 30 tablet; Refill: 5  2. Bacterial vaginosis - continue  chronic MetroGel Regimen, monthly  3. Anxiety about health -  4. Depo-Provera contraceptive status - continue Depo Provera Injections    Plan    Follow up in 2 weeks for next Depo Injection, with abstinence and negative UPT   No orders of the defined types were placed in this encounter.  Meds ordered this encounter  Medications  . ibuprofen (ADVIL,MOTRIN) 800 MG tablet    Sig: Take 1 tablet (800 mg total) by mouth every 8 (eight) hours as needed.    Dispense:  30 tablet    Refill:  5

## 2017-09-03 ENCOUNTER — Encounter (HOSPITAL_COMMUNITY): Payer: Self-pay | Admitting: Family Medicine

## 2017-09-03 ENCOUNTER — Ambulatory Visit (HOSPITAL_COMMUNITY)
Admission: EM | Admit: 2017-09-03 | Discharge: 2017-09-03 | Disposition: A | Discharge status: Home / Self Care | Payer: Medicaid Other | Attending: Physician Assistant | Admitting: Physician Assistant

## 2017-09-03 DIAGNOSIS — J00 Acute nasopharyngitis [common cold]: Secondary | ICD-10-CM

## 2017-09-03 DIAGNOSIS — H9202 Otalgia, left ear: Secondary | ICD-10-CM

## 2017-09-03 MED ORDER — FLUTICASONE PROPIONATE 50 MCG/ACT NA SUSP: 2.0000 | Freq: Every day | NASAL | 0 refills | Status: DC

## 2017-09-03 MED ORDER — NEOMYCIN-POLYMYXIN-HC 3.5-10000-1 OT SUSP: 4.0000 [drp] | Freq: Three times a day (TID) | OTIC | 0 refills | Status: AC

## 2017-09-03 MED ORDER — CETIRIZINE-PSEUDOEPHEDRINE ER 5-120 MG PO TB12: 1.0000 | ORAL_TABLET | Freq: Every day | ORAL | 0 refills | Status: DC

## 2017-09-03 NOTE — Discharge Instructions (Signed)
Cortisporin drops in left ear as directed. Start flonase, zyrtec-D for nasal congestion. You can use over the counter nasal saline rinse such as neti pot for nasal congestion. Keep hydrated, your urine should be clear to pale yellow in color. Tylenol/motrin for fever and pain. Monitor for any worsening of symptoms, chest pain, shortness of breath, wheezing, swelling of the throat, follow up for reevaluation. Otherwise follow up with PCP as needed.

## 2017-09-03 NOTE — ED Triage Notes (Signed)
Pt here for left ear pain and throat pain. Reports that she stuck a pen in her ear and now she feels like something is stuck in there.

## 2017-09-03 NOTE — ED Provider Notes (Signed)
[facility]    CSN: [id number] Arrival date & time: 09/03/17  1529     History   Chief Complaint Chief Complaint  Patient presents with  . Otalgia  . Sore Throat    HPI Leah Barry is a 31 y.o. female.   31 year old female with history of anxiety, bipolar disorder, depression, IBS comes in for 1 week history of intermittent left ear pain and sore throat. She also experiences mild nasal congestion, rhinorrhea, cough. Denies fever, chills, night sweats. Denies chest pain, shortness of breath, wheezing. She also experiences some left ear itchiness, which she attempted to put a pen in the ear to alleviate the itchiness, and caused acute pain. Denies drainage/hearing changes. Denies sick contact. History of seasonal allergies, has been taking benadryl on and off since symptoms started with little relief. Current every day smoker, used to smoke cigarettes, no with black and miles.       Past Medical History:  Diagnosis Date  . Anxiety   . Apnea, sleep    no CPAP use, states lost machine during move to [place]  . Bipolar 1 disorder (HCC)   . Bipolar disorder (HCC)    no current med.  . Depression   . History of MRSA infection 2010  . Irritable bowel syndrome (IBS)    no current med.  . Pelvic inflammatory disease   . Scalp laceration    staples to be removed 03/13/2014  . Ulnar fracture 03/09/2014   left    Patient Active Problem List   Diagnosis Date Noted  . Dysmenorrhea 05/28/2014  . GC (gonococcus) 05/28/2014  . Chlamydia contact 05/28/2014  . Pain 04/14/2014  . UTI symptoms 04/14/2014  . Routine general medical examination at a health care facility 09/26/2013  . Candidiasis of vulva and vagina 09/26/2013  . Unspecified symptom associated with female genital organs 09/10/2013  . Abnormal uterine bleeding (AUB) 09/10/2013  . PID (pelvic inflammatory disease) 07/29/2013  . Dysuria 07/29/2013  . Screening examination for venereal disease 07/29/2013  .  Chronic PID (chronic pelvic inflammatory disease) 06/30/2013  . Excessive or frequent menstruation 06/30/2013  . Genital herpes, unspecified 06/30/2013  . Unspecified inflammatory disease of female pelvic organs and tissues 03/12/2013    Past Surgical History:  Procedure Laterality Date  . CYST EXCISION     middle of back  . ORIF ULNAR FRACTURE Left 03/16/2014   Procedure: OPEN REDUCTION INTERNAL FIXATION (ORIF) LEFT ULNAR FRACTURE;  Surgeon: Marlowe ShoresMatthew A Weingold, MD;  Location: [facility];  Service: Orthopedics;  Laterality: Left;  left  . OVARIAN CYST REMOVAL    . THYROID CYST EXCISION     nodule exc.  . TUBAL LIGATION      OB History    Gravida Para Term Preterm AB Living   5 4 4   1 4    SAB TAB Ectopic Multiple Live Births       1   4       Home Medications    Prior to Admission medications   Medication Sig Start Date End Date Taking? Authorizing Provider  ALPRAZolam Prudy Feeler(XANAX) 1 MG tablet Take 1 mg by mouth at bedtime as needed for anxiety.     [provider]  cetirizine-pseudoephedrine (ZYRTEC-D) 5-120 MG tablet Take 1 tablet by mouth daily. 09/03/17   Cathie HoopsYu, Oren Barella V, PA-C  clindamycin (CLEOCIN) 2 % vaginal cream Place 1 Applicatorful vaginally at bedtime. 08/13/17   Aviva KluverMurray, Alyssa B, PA-C  dicyclomine (BENTYL)  20 MG tablet Take 1 tablet (20 mg total) by mouth 4 (four) times daily -  before meals and at bedtime. 08/13/17 08/23/17  Aviva Kluver B, PA-C  fluticasone (FLONASE) 50 MCG/ACT nasal spray Place 2 sprays into both nostrils daily. 09/03/17   Cathie Hoops, Letetia Romanello V, PA-C  gabapentin (NEURONTIN) 300 MG capsule Take 1 capsule (300 mg total) by mouth 3 (three) times daily. 05/30/17   Brock Bad, MD  HYDROcodone-acetaminophen (NORCO/VICODIN) 5-325 MG tablet Take 1-2 tablets by mouth every 6 (six) hours as needed for severe pain. Patient not taking: Reported on 08/11/2017 07/31/17   Anselm Pancoast, PA-C  ibuprofen (ADVIL,MOTRIN) 600 MG tablet Take 1 tablet (600 mg  total) by mouth every 6 (six) hours as needed. Patient taking differently: Take 600 mg by mouth every 6 (six) hours as needed for headache (pain).  07/31/17   Joy, Shawn C, PA-C  ibuprofen (ADVIL,MOTRIN) 800 MG tablet Take 1 tablet (800 mg total) by mouth every 8 (eight) hours as needed. 08/22/17   Brock Bad, MD  methocarbamol (ROBAXIN) 500 MG tablet Take 1 tablet (500 mg total) by mouth at bedtime as needed for muscle spasms. 08/11/17   Mathews Robinsons B, PA-C  metroNIDAZOLE (METROGEL VAGINAL) 0.75 % vaginal gel Place 1 Applicatorful vaginally 2 (two) times daily. 05/30/17   Brock Bad, MD  neomycin-polymyxin-hydrocortisone (CORTISPORIN) 3.5-10000-1 OTIC suspension Place 4 drops into the left ear 3 (three) times daily. 09/03/17 09/10/17  Cathie Hoops, Kaede Clendenen V, PA-C  ondansetron (ZOFRAN ODT) 4 MG disintegrating tablet Take 1 tablet (4 mg total) by mouth every 8 (eight) hours as needed for nausea or vomiting. 08/11/17   Georgiana Shore, PA-C  oxyCODONE-acetaminophen (PERCOCET) 7.5-325 MG tablet Take 1 tablet by mouth 3 (three) times daily as needed (pain).  07/27/17   [provider]  promethazine (PHENERGAN) 25 MG tablet Take 1 tablet (25 mg total) by mouth every 6 (six) hours as needed for nausea or vomiting. Patient not taking: Reported on 05/30/2017 05/27/17   Vanetta Mulders, MD  sertraline (ZOLOFT) 100 MG tablet Take 100 mg by mouth daily. 06/02/17   [provider]    Family History Family History  Problem Relation Age of Onset  . Heart disease Mother   . Cancer Maternal Grandmother        breast  . Cancer Paternal Grandmother        breast    Social History Social History  Substance Use Topics  . Smoking status: Former Smoker    Years: 3.00    Types: Cigars    Quit date: 04/28/2017  . Smokeless tobacco: Never Used     Comment: 1-2 cig./day  . Alcohol use No     Comment: occ     Allergies   Latuda [lurasidone hcl]; Sulfa antibiotics; Bee venom; Flagyl  [metronidazole]; Naproxen; Tramadol; and Toradol [ketorolac tromethamine]   Review of Systems Review of Systems  Reason unable to perform ROS: See HPI as above.     Physical Exam Triage Vital Signs ED Triage Vitals  Enc Vitals Group     BP 09/03/17 1600 130/87     Pulse Rate 09/03/17 1600 86     Resp 09/03/17 1600 16     Temp 09/03/17 1600 98.2 F (36.8 C)     Temp Source 09/03/17 1600 Oral     SpO2 09/03/17 1600 96 %     Weight 09/03/17 1600 189 lb (85.7 kg)     Height 09/03/17 1600 5'  7" (1.702 m)     Head Circumference --      Peak Flow --      Pain Score 09/03/17 1603 8     Pain Loc --      Pain Edu? --      Excl. in GC? --    No data found.   Updated Vital Signs BP 130/87 (BP Location: Right Arm)   Pulse 86   Temp 98.2 F (36.8 C) (Oral)   Resp 16   Ht 5\' 7"  (1.702 m)   Wt 189 lb (85.7 kg)   LMP  (LMP Unknown)   SpO2 96%   BMI 29.60 kg/m   Physical Exam  Constitutional: She is oriented to person, place, and time. She appears well-developed and well-nourished. No distress.  HENT:  Head: Normocephalic and atraumatic.  Right Ear: External ear and ear canal normal. Tympanic membrane is erythematous. Tympanic membrane is not bulging.  Left Ear: External ear normal. Tympanic membrane is erythematous. Tympanic membrane is not bulging.  Nose: Mucosal edema and rhinorrhea present. Right sinus exhibits no maxillary sinus tenderness and no frontal sinus tenderness. Left sinus exhibits no maxillary sinus tenderness and no frontal sinus tenderness.  Mouth/Throat: Uvula is midline, oropharynx is clear and moist and mucous membranes are normal.  Left tragus tenderness to palpation. Mild erythema of the left ear canal, no swelling/drainage noted.   Eyes: Pupils are equal, round, and reactive to light. Conjunctivae are normal.  Neck: Normal range of motion. Neck supple.  Cardiovascular: Normal rate, regular rhythm and normal heart sounds.  Exam reveals no gallop and no  friction rub.   No murmur heard. Pulmonary/Chest: Effort normal and breath sounds normal. She has no decreased breath sounds. She has no wheezes. She has no rhonchi. She has no rales.  Lymphadenopathy:    She has no cervical adenopathy.  Neurological: She is alert and oriented to person, place, and time.  Skin: Skin is warm and dry.  Psychiatric: She has a normal mood and affect. Her behavior is normal. Judgment normal.     UC Treatments / Results  Labs (all labs ordered are listed, but only abnormal results are displayed) Labs Reviewed - No data to display  EKG  EKG Interpretation None       Radiology No results found.  Procedures Procedures (including critical care time)  Medications Ordered in UC Medications - No data to display   Initial Impression / Assessment and Plan / UC Course  I have reviewed the triage vital signs and the nursing notes.  Pertinent labs & imaging results that were available during my care of the patient were reviewed by me and considered in my medical decision making (see chart for details).    Cortisporin for possible otitis externa given patient jammed foreign object into ear. Discussed with patient history and exam most consistent with viral URI. Symptomatic treatment as needed. Push fluids. Return precautions given.   Final Clinical Impressions(s) / UC Diagnoses   Final diagnoses:  Acute nasopharyngitis  Left ear pain    New Prescriptions Discharge Medication List as of 09/03/2017  4:23 PM    START taking these medications   Details  cetirizine-pseudoephedrine (ZYRTEC-D) 5-120 MG tablet Take 1 tablet by mouth daily., Starting Mon 09/03/2017, Normal    fluticasone (FLONASE) 50 MCG/ACT nasal spray Place 2 sprays into both nostrils daily., Starting Mon 09/03/2017, Normal    neomycin-polymyxin-hydrocortisone (CORTISPORIN) 3.5-10000-1 OTIC suspension Place 4 drops into the left ear 3 (three) times  daily., Starting Mon 09/03/2017,  Until Mon 09/10/2017, Normal          Linward Headland V, PA-C 09/03/17 1652

## 2017-09-16 ENCOUNTER — Emergency Department (HOSPITAL_COMMUNITY)
Admission: EM | Admit: 2017-09-16 | Discharge: 2017-09-16 | Discharge status: Against Medical Advice | Payer: Medicaid Other | Attending: Emergency Medicine | Admitting: Emergency Medicine

## 2017-09-16 ENCOUNTER — Emergency Department (HOSPITAL_COMMUNITY): Payer: Medicaid Other

## 2017-09-16 ENCOUNTER — Encounter (HOSPITAL_COMMUNITY): Payer: Self-pay | Admitting: Emergency Medicine

## 2017-09-16 DIAGNOSIS — Z87891 Personal history of nicotine dependence: Secondary | ICD-10-CM | POA: Diagnosis not present

## 2017-09-16 DIAGNOSIS — R0789 Other chest pain: Secondary | ICD-10-CM | POA: Insufficient documentation

## 2017-09-16 DIAGNOSIS — Z79899 Other long term (current) drug therapy: Secondary | ICD-10-CM | POA: Insufficient documentation

## 2017-09-16 DIAGNOSIS — G8929 Other chronic pain: Secondary | ICD-10-CM | POA: Insufficient documentation

## 2017-09-16 DIAGNOSIS — R079 Chest pain, unspecified: Secondary | ICD-10-CM | POA: Diagnosis present

## 2017-09-16 DIAGNOSIS — Z5320 Procedure and treatment not carried out because of patient's decision for unspecified reasons: Secondary | ICD-10-CM | POA: Insufficient documentation

## 2017-09-16 LAB — CBC
HCT: 39.2 % (ref 36.0–46.0)
Hemoglobin: 13.7 g/dL (ref 12.0–15.0)
MCH: 27.1 pg (ref 26.0–34.0)
MCHC: 34.9 g/dL (ref 30.0–36.0)
MCV: 77.6 fL — ABNORMAL LOW (ref 78.0–100.0)
Platelets: 228 10*3/uL (ref 150–400)
RBC: 5.05 MIL/uL (ref 3.87–5.11)
RDW: 13.3 % (ref 11.5–15.5)
WBC: 9.9 10*3/uL (ref 4.0–10.5)

## 2017-09-16 LAB — I-STAT BETA HCG BLOOD, ED (MC, WL, AP ONLY): I-stat hCG, quantitative: <5 m[IU]/mL (ref <5)

## 2017-09-16 LAB — COMPREHENSIVE METABOLIC PANEL
ALT: 15 U/L (ref 14–54)
AST: 16 U/L (ref 15–41)
Albumin: 3 g/dL — ABNORMAL LOW (ref 3.5–5.0)
Alkaline Phosphatase: 66 U/L (ref 38–126)
Anion gap: 7 (ref 5–15)
BUN: 16 mg/dL (ref 6–20)
CO2: 23 mmol/L (ref 22–32)
Calcium: 8.1 mg/dL — ABNORMAL LOW (ref 8.9–10.3)
Chloride: 108 mmol/L (ref 101–111)
Creatinine, Ser: 0.57 mg/dL (ref 0.44–1.00)
GFR calc Af Amer: >60 mL/min (ref >60)
GFR calc non Af Amer: >60 mL/min (ref >60)
Glucose, Bld: 113 mg/dL — ABNORMAL HIGH (ref 65–99)
Potassium: 3.5 mmol/L (ref 3.5–5.1)
Sodium: 138 mmol/L (ref 135–145)
Total Bilirubin: 0.5 mg/dL (ref 0.3–1.2)
Total Protein: 5.5 g/dL — ABNORMAL LOW (ref 6.5–8.1)

## 2017-09-16 LAB — I-STAT TROPONIN, ED: Troponin i, poc: 0 ng/mL (ref 0.00–0.08)

## 2017-09-16 LAB — LIPASE, BLOOD: Lipase: 27 U/L (ref 11–51)

## 2017-09-16 LAB — D-DIMER, QUANTITATIVE: D-Dimer, Quant: 0.46 ug/mL-FEU (ref 0.00–0.50)

## 2017-09-16 MED ORDER — HYDROXYZINE HCL 25 MG PO TABS
25.0000 mg | ORAL_TABLET | Freq: Once | ORAL | Status: AC
  Administered 2017-09-16: 25 mg via ORAL
  Filled 2017-09-16: qty 1

## 2017-09-16 MED ORDER — ALBUTEROL SULFATE (2.5 MG/3ML) 0.083% IN NEBU
5.0000 mg | INHALATION_SOLUTION | Freq: Once | RESPIRATORY_TRACT | Status: AC
  Administered 2017-09-16: 5 mg via RESPIRATORY_TRACT
  Filled 2017-09-16: qty 6

## 2017-09-16 NOTE — ED Notes (Signed)
Pt walked out

## 2017-09-16 NOTE — ED Notes (Addendum)
Pt becoming frustrated because she is hungry. Pt has been given MULTIPLE packs of crackers and has had the delay explained. Currently looking for MD Yelverton to be made aware.

## 2017-09-16 NOTE — ED Provider Notes (Signed)
[facility]ENC[facility]PT Provider Note   CSN: [id number] Arrival date & time: 09/16/17  1518     History   Chief Complaint Chief Complaint  Patient presents with  . Chest Pain    HPI Leah Barry is a 31 y.o. female.  HPI Patient presents with 2-3 days of episodic central chest pain radiating to her back.  States she had chest pain yesterday evening when lying down.  She had associated shortness of breath.  She had a mild cough without sputum production.  Said that while she was standing doing dishes this afternoon she began having central chest pain.  She currently has mild chest pain which is worse with deep breathing.  Denies any recent travel or extended immobilization.  No new lower extremity swelling or pain.  No fever or chills.  No first-degree relatives with MI at early age.  Patient does smoke several cigarettes daily and admits to heavy alcohol use.  States she has been out of her Zoloft for the past 2 weeks. Past Medical History:  Diagnosis Date  . Anxiety   . Apnea, sleep    no CPAP use, states lost machine during move to [place]  . Bipolar 1 disorder (HCC)   . Bipolar disorder (HCC)    no current med.  . Depression   . History of MRSA infection 2010  . Irritable bowel syndrome (IBS)    no current med.  . Pelvic inflammatory disease   . Scalp laceration    staples to be removed 03/13/2014  . Ulnar fracture 03/09/2014   left    Patient Active Problem List   Diagnosis Date Noted  . Dysmenorrhea 05/28/2014  . GC (gonococcus) 05/28/2014  . Chlamydia contact 05/28/2014  . Pain 04/14/2014  . UTI symptoms 04/14/2014  . Routine general medical examination at a health care facility 09/26/2013  . Candidiasis of vulva and vagina 09/26/2013  . Unspecified symptom associated with female genital organs 09/10/2013  . Abnormal uterine bleeding (AUB) 09/10/2013  . PID (pelvic inflammatory disease) 07/29/2013  . Dysuria 07/29/2013  . Screening  examination for venereal disease 07/29/2013  . Chronic PID (chronic pelvic inflammatory disease) 06/30/2013  . Excessive or frequent menstruation 06/30/2013  . Genital herpes, unspecified 06/30/2013  . Unspecified inflammatory disease of female pelvic organs and tissues 03/12/2013    Past Surgical History:  Procedure Laterality Date  . CYST EXCISION     middle of back  . OVARIAN CYST REMOVAL    . THYROID CYST EXCISION     nodule exc.  . TUBAL LIGATION      OB History    Gravida Para Term Preterm AB Living   5 4 4   1 4    SAB TAB Ectopic Multiple Live Births       1   4       Home Medications    Prior to Admission medications   Medication Sig Start Date End Date Taking? Authorizing Provider  ALPRAZolam Prudy Feeler) 1 MG tablet Take 1 mg by mouth at bedtime as needed for anxiety.    Yes [provider]  clindamycin (CLEOCIN) 2 % vaginal cream Place 1 Applicatorful vaginally at bedtime. 08/13/17  Yes Murray, Alyssa B, PA-C  fluticasone (FLONASE) 50 MCG/ACT nasal spray Place 2 sprays into both nostrils daily. 09/03/17  Yes Yu, Amy V, PA-C  gabapentin (NEURONTIN) 300 MG capsule Take 1 capsule (300 mg total) by mouth 3 (three) times daily. 05/30/17  Yes Brock Bad,  MD  ibuprofen (ADVIL,MOTRIN) 800 MG tablet Take 1 tablet (800 mg total) by mouth every 8 (eight) hours as needed. 08/22/17  Yes Brock Bad, MD  methocarbamol (ROBAXIN) 500 MG tablet Take 1 tablet (500 mg total) by mouth at bedtime as needed for muscle spasms. 08/11/17  Yes Georgiana Shore, PA-C  oxyCODONE-acetaminophen (PERCOCET) 7.5-325 MG tablet Take 1 tablet by mouth 3 (three) times daily as needed (pain).  07/27/17  Yes [provider]  promethazine (PHENERGAN) 25 MG tablet Take 1 tablet (25 mg total) by mouth every 6 (six) hours as needed for nausea or vomiting. 05/27/17  Yes Vanetta Mulders, MD  sertraline (ZOLOFT) 100 MG tablet Take 100 mg by mouth daily. 06/02/17  Yes [provider]  cetirizine-pseudoephedrine (ZYRTEC-D) 5-120 MG tablet Take 1 tablet by mouth daily. Patient not taking: Reported on 09/16/2017 09/03/17   Belinda Fisher, PA-C  dicyclomine (BENTYL) 20 MG tablet Take 1 tablet (20 mg total) by mouth 4 (four) times daily -  before meals and at bedtime. 08/13/17 08/23/17  Aviva Kluver B, PA-C  HYDROcodone-acetaminophen (NORCO/VICODIN) 5-325 MG tablet Take 1-2 tablets by mouth every 6 (six) hours as needed for severe pain. Patient not taking: Reported on 08/11/2017 07/31/17   Anselm Pancoast, PA-C  ibuprofen (ADVIL,MOTRIN) 600 MG tablet Take 1 tablet (600 mg total) by mouth every 6 (six) hours as needed. Patient not taking: Reported on 09/16/2017 07/31/17   Joy, Ines Bloomer C, PA-C  metroNIDAZOLE (METROGEL VAGINAL) 0.75 % vaginal gel Place 1 Applicatorful vaginally 2 (two) times daily. Patient not taking: Reported on 09/16/2017 05/30/17   Brock Bad, MD  ondansetron (ZOFRAN ODT) 4 MG disintegrating tablet Take 1 tablet (4 mg total) by mouth every 8 (eight) hours as needed for nausea or vomiting. Patient not taking: Reported on 09/16/2017 08/11/17   Gregary Cromer    Family History Family History  Problem Relation Age of Onset  . Heart disease Mother   . Cancer Maternal Grandmother        breast  . Cancer Paternal Grandmother        breast    Social History Social History   Tobacco Use  . Smoking status: Former Smoker    Years: 3.00    Types: Cigars    Last attempt to quit: 04/28/2017    Years since quitting: 0.3  . Smokeless tobacco: Never Used  . Tobacco comment: 1-2 cig./day  Substance Use Topics  . Alcohol use: No    Alcohol/week: 0.0 oz    Comment: occ  . Drug use: No     Allergies   Latuda [lurasidone hcl]; Sulfa antibiotics; Bee venom; Flagyl [metronidazole]; Naproxen; Tramadol; and Toradol [ketorolac tromethamine]   Review of Systems Review of Systems  Constitutional: Negative for chills and fever.  HENT: Negative for congestion,  sinus pressure, sinus pain and sore throat.   Respiratory: Positive for cough and shortness of breath.   Cardiovascular: Positive for chest pain. Negative for palpitations and leg swelling.  Gastrointestinal: Negative for abdominal pain, diarrhea, nausea and vomiting.  Genitourinary: Negative for difficulty urinating, dysuria, flank pain, frequency and pelvic pain.  Musculoskeletal: Positive for back pain and myalgias. Negative for neck pain and neck stiffness.  Skin: Negative for rash and wound.  Neurological: Negative for dizziness, weakness, light-headedness, numbness and headaches.  All other systems reviewed and are negative.    Physical Exam Updated Vital Signs BP 135/77 (BP Location: Left Arm)   Pulse 78  Temp 97.8 F (36.6 C) (Oral)   Resp 18   SpO2 100%   Physical Exam  Constitutional: She is oriented to person, place, and time. She appears well-developed and well-nourished.  Non-toxic appearance. She does not appear ill.  Patient appears quite anxious  HENT:  Head: Normocephalic and atraumatic.  Mouth/Throat: Oropharynx is clear and moist.  Eyes: EOM are normal. Pupils are equal, round, and reactive to light.  Neck: Normal range of motion. Neck supple.  Cardiovascular: Normal rate, regular rhythm, intact distal pulses and normal pulses. Exam reveals no friction rub.  No murmur heard. Patient does have some tenderness to palpation over the left parasternal region.  There is no crepitance or deformity.  Pulmonary/Chest: Effort normal. No accessory muscle usage. No tachypnea. No respiratory distress. She has decreased breath sounds in the right lower field and the left lower field.  Abdominal: Soft. Bowel sounds are normal. There is no tenderness. There is no rebound and no guarding.  Musculoskeletal: Normal range of motion. She exhibits no edema or tenderness.       Right lower leg: Normal. She exhibits no tenderness and no edema.       Left lower leg: Normal. She  exhibits no tenderness and no edema.  No midline thoracic or lumbar tenderness.  No CVA tenderness.  Lymphadenopathy:    She has no cervical adenopathy.  Neurological: She is alert and oriented to person, place, and time.  Skin: Skin is warm and dry. Capillary refill takes less than 2 seconds. No rash noted. No erythema.  Psychiatric: Her behavior is normal. Her mood appears anxious.  Nursing note and vitals reviewed.    ED Treatments / Results  Labs (all labs ordered are listed, but only abnormal results are displayed) Labs Reviewed  CBC - Abnormal; Notable for the following components:      Result Value   MCV 77.6 (*)    All other components within normal limits  COMPREHENSIVE METABOLIC PANEL - Abnormal; Notable for the following components:   Glucose, Bld 113 (*)    Calcium 8.1 (*)    Total Protein 5.5 (*)    Albumin 3.0 (*)    All other components within normal limits  LIPASE, BLOOD  D-DIMER, QUANTITATIVE (NOT AT [facility])  I-STAT TROPONIN, ED  I-STAT BETA HCG BLOOD, ED ([facility], [facility], [facility] ONLY)    EKG  EKG Interpretation  Date/Time:  Sunday September 16 2017 15:25:50 EST Ventricular Rate:  75 PR Interval:    QRS Duration: 94 QT Interval:  359 QTC Calculation: 401 R Axis:   9 Text Interpretation:  Sinus rhythm Low voltage, precordial leads Confirmed by Loren RacerYelverton, Cade Dashner ([id number]) on 09/16/2017 4:26:25 PM       Radiology Dg Chest 2 View  Result Date: 09/16/2017 CLINICAL DATA:  31 year old female with sharp chest pain for 3 days. EXAM: CHEST  2 VIEW COMPARISON:  11/16/2014. FINDINGS: The heart size and mediastinal contours are within normal limits. Slightly low lung volumes. Both lungs are clear. The visualized skeletal structures are unremarkable. IMPRESSION: No active cardiopulmonary disease. Electronically Signed   By: Sande BrothersSerena  Chacko M.D.   On: 09/16/2017 16:12    Procedures Procedures (including critical care time)  Medications Ordered in ED Medications  albuterol  (PROVENTIL) (2.5 MG/3ML) 0.083% nebulizer solution 5 mg (5 mg Nebulization Given 09/16/17 1712)  hydrOXYzine (ATARAX/VISTARIL) tablet 25 mg (25 mg Oral Given 09/16/17 1747)     Initial Impression / Assessment and Plan / ED Course  I have reviewed  the triage vital signs and the nursing notes.  Pertinent labs & imaging results that were available during my care of the patient were reviewed by me and considered in my medical decision making (see chart for details).     Labs are unremarkable.  Chest pain is likely musculoskeletal in nature.  Patient left prior to receiving discharge instructions  Final Clinical Impressions(s) / ED Diagnoses   Final diagnoses:  Chest wall pain    New Prescriptions This SmartLink is deprecated. Use AVSMEDLIST instead to display the medication list for a patient.   Loren Racer, MD 09/16/17 2033

## 2017-09-16 NOTE — ED Triage Notes (Signed)
Patient c/o intermittent central chest pain radiating to back x4 days worsening today. Reports SOB. Denies N/V/D. Ambulatory. Speaking in full sentences in NAD.

## 2017-09-20 ENCOUNTER — Other Ambulatory Visit: Payer: Self-pay

## 2017-09-20 ENCOUNTER — Emergency Department (HOSPITAL_COMMUNITY)
Admission: EM | Admit: 2017-09-20 | Discharge: 2017-09-20 | Disposition: A | Discharge status: Home / Self Care | Payer: Medicaid Other | Attending: Emergency Medicine | Admitting: Emergency Medicine

## 2017-09-20 ENCOUNTER — Encounter (HOSPITAL_COMMUNITY): Payer: Self-pay | Admitting: *Deleted

## 2017-09-20 DIAGNOSIS — R1012 Left upper quadrant pain: Secondary | ICD-10-CM

## 2017-09-20 DIAGNOSIS — Z79899 Other long term (current) drug therapy: Secondary | ICD-10-CM | POA: Insufficient documentation

## 2017-09-20 DIAGNOSIS — R310 Gross hematuria: Secondary | ICD-10-CM | POA: Insufficient documentation

## 2017-09-20 DIAGNOSIS — Z87891 Personal history of nicotine dependence: Secondary | ICD-10-CM | POA: Diagnosis not present

## 2017-09-20 LAB — URINALYSIS, ROUTINE W REFLEX MICROSCOPIC
Bilirubin Urine: NEGATIVE
Glucose, UA: NEGATIVE mg/dL
Ketones, ur: NEGATIVE mg/dL
Leukocytes, UA: NEGATIVE
Nitrite: NEGATIVE
Protein, ur: NEGATIVE mg/dL
Specific Gravity, Urine: 1.015 (ref 1.005–1.030)
pH: 5 (ref 5.0–8.0)

## 2017-09-20 LAB — COMPREHENSIVE METABOLIC PANEL
ALT: 19 U/L (ref 14–54)
AST: 18 U/L (ref 15–41)
Albumin: 3.2 g/dL — ABNORMAL LOW (ref 3.5–5.0)
Alkaline Phosphatase: 76 U/L (ref 38–126)
Anion gap: 5 (ref 5–15)
BUN: 11 mg/dL (ref 6–20)
CO2: 25 mmol/L (ref 22–32)
Calcium: 8.7 mg/dL — ABNORMAL LOW (ref 8.9–10.3)
Chloride: 106 mmol/L (ref 101–111)
Creatinine, Ser: 0.45 mg/dL (ref 0.44–1.00)
GFR calc Af Amer: >60 mL/min (ref >60)
GFR calc non Af Amer: >60 mL/min (ref >60)
Glucose, Bld: 94 mg/dL (ref 65–99)
Potassium: 3.7 mmol/L (ref 3.5–5.1)
Sodium: 136 mmol/L (ref 135–145)
Total Bilirubin: 0.5 mg/dL (ref 0.3–1.2)
Total Protein: 5.8 g/dL — ABNORMAL LOW (ref 6.5–8.1)

## 2017-09-20 LAB — CBC WITH DIFFERENTIAL/PLATELET
Basophils Absolute: 0 10*3/uL (ref 0.0–0.1)
Basophils Relative: 1 %
Eosinophils Absolute: 0.3 10*3/uL (ref 0.0–0.7)
Eosinophils Relative: 3 %
HCT: 37.6 % (ref 36.0–46.0)
Hemoglobin: 13 g/dL (ref 12.0–15.0)
Lymphocytes Relative: 30 %
Lymphs Abs: 2.5 10*3/uL (ref 0.7–4.0)
MCH: 26.8 pg (ref 26.0–34.0)
MCHC: 34.6 g/dL (ref 30.0–36.0)
MCV: 77.5 fL — ABNORMAL LOW (ref 78.0–100.0)
Monocytes Absolute: 0.7 10*3/uL (ref 0.1–1.0)
Monocytes Relative: 9 %
Neutro Abs: 4.8 10*3/uL (ref 1.7–7.7)
Neutrophils Relative %: 58 %
Platelets: 212 10*3/uL (ref 150–400)
RBC: 4.85 MIL/uL (ref 3.87–5.11)
RDW: 13.5 % (ref 11.5–15.5)
WBC: 8.4 10*3/uL (ref 4.0–10.5)

## 2017-09-20 LAB — LIPASE, BLOOD: Lipase: 28 U/L (ref 11–51)

## 2017-09-20 LAB — POC URINE PREG, ED: Preg Test, Ur: NEGATIVE

## 2017-09-20 MED ORDER — PANTOPRAZOLE SODIUM 40 MG PO TBEC: 40.0000 mg | DELAYED_RELEASE_TABLET | Freq: Every day | ORAL | 0 refills | Status: DC

## 2017-09-20 MED ORDER — DIPHENHYDRAMINE HCL 25 MG PO CAPS
50.0000 mg | ORAL_CAPSULE | Freq: Once | ORAL | Status: AC
  Administered 2017-09-20: 50 mg via ORAL
  Filled 2017-09-20: qty 2

## 2017-09-20 MED ORDER — ONDANSETRON HCL 4 MG/2ML IJ SOLN
4.0000 mg | Freq: Once | INTRAMUSCULAR | Status: AC
  Administered 2017-09-20: 4 mg via INTRAVENOUS
  Filled 2017-09-20: qty 2

## 2017-09-20 MED ORDER — SODIUM CHLORIDE 0.9 % IV BOLUS (SEPSIS)
1000.0000 mL | Freq: Once | INTRAVENOUS | Status: AC
  Administered 2017-09-20: 1000 mL via INTRAVENOUS

## 2017-09-20 MED ORDER — SERTRALINE HCL 100 MG PO TABS: 100.0000 mg | ORAL_TABLET | Freq: Every day | ORAL | 0 refills | Status: DC

## 2017-09-20 MED ORDER — FENTANYL CITRATE (PF) 100 MCG/2ML IJ SOLN
100.0000 ug | Freq: Once | INTRAMUSCULAR | Status: AC
  Administered 2017-09-20: 100 ug via INTRAVENOUS
  Filled 2017-09-20: qty 2

## 2017-09-20 NOTE — ED Notes (Signed)
Pt staets itching resolved

## 2017-09-20 NOTE — ED Notes (Signed)
Pt states she understands instructions and home stable with steady gait. 

## 2017-09-20 NOTE — ED Notes (Addendum)
Pt c/o itching after medication. No resp distress or swelling noted. Dr. Criss AlvineGoldston aware.

## 2017-09-20 NOTE — ED Triage Notes (Signed)
To ED for eval of right upper abd pain and back pain since yesterday. Pt states she noticed sediment in urine yesterday. No nausea or vomiting. Last lmp was in Sept. Appears in nad.

## 2017-09-20 NOTE — ED Provider Notes (Addendum)
[facility] EMERGENCY [facility]PARTMENT Provider Note   CSN: [id number] Arrival date & time: 09/20/17  0758     History   Chief Complaint Chief Complaint  Patient presents with  . Hematuria  . Back Pain    HPI Leah Barry is a 31 y.o. female.  HPI  31 year old female presents with chief complaint of left upper quadrant abdominal pain.  She states that she has been having hematuria and urinary frequency for the past 1 week.  The hematuria is on and off and is pink.  Had dysuria at first but that has resolved.  However the frequency remains and she is also been feeling very thirsty.  Over the last 3 days has developed left upper quadrant abdominal pain and left upper back pain.  These pains seem to come at the same time but it does not feel like one is radiating to the other.  The abdominal pain feels sharp.  She states she has a long-standing history of alcohol abuse and while she has cut back still drinks daily.  Pain does not worsen with eating or drinking.  Has felt nauseated but no vomiting.  No fevers.  Had chest pain or shortness of breath a few days ago and was [facility] but none no current chest pain or shortness of breath.  No vaginal bleeding.  No diarrhea, constipation, or hematochezia. Has taken percocet with no relief.  Past Medical History:  Diagnosis Date  . Anxiety   . Apnea, sleep    no CPAP use, states lost machine during move to [employer]  . Bipolar 1 disorder (HCC)   . Bipolar disorder (HCC)    no current med.  . Depression   . History of MRSA infection 2010  . Irritable bowel syndrome (IBS)    no current med.  . Pelvic inflammatory disease   . Scalp laceration    staples to be removed 03/13/2014  . Ulnar fracture 03/09/2014   left    Patient Active Problem List   Diagnosis Date Noted  . Dysmenorrhea 05/28/2014  . GC (gonococcus) 05/28/2014  . Chlamydia contact 05/28/2014  . Pain 04/14/2014  . UTI symptoms 04/14/2014  . Routine  general medical examination at a health care facility 09/26/2013  . Candidiasis of vulva and vagina 09/26/2013  . Unspecified symptom associated with female genital organs 09/10/2013  . Abnormal uterine bleeding (AUB) 09/10/2013  . PID (pelvic inflammatory disease) 07/29/2013  . Dysuria 07/29/2013  . Screening examination for venereal disease 07/29/2013  . Chronic PID (chronic pelvic inflammatory disease) 06/30/2013  . Excessive or frequent menstruation 06/30/2013  . Genital herpes, unspecified 06/30/2013  . Unspecified inflammatory disease of female pelvic organs and tissues 03/12/2013    Past Surgical History:  Procedure Laterality Date  . CYST EXCISION     middle of back  . OVARIAN CYST REMOVAL    . THYROID CYST EXCISION     nodule exc.  . TUBAL LIGATION      OB History    Gravida Para Term Preterm AB Living   5 4 4   1 4    SAB TAB Ectopic Multiple Live Births       1   4       Home Medications    Prior to Admission medications   Medication Sig Start Date End Date Taking? Authorizing Provider  ALPRAZolam Prudy Feeler(XANAX) 1 MG tablet Take 1 mg by mouth at bedtime as needed for anxiety.  [provider]  cetirizine-pseudoephedrine (ZYRTEC-D) 5-120 MG tablet Take 1 tablet by mouth daily. Patient not taking: Reported on 09/16/2017 09/03/17   Belinda Fisher, PA-C  clindamycin (CLEOCIN) 2 % vaginal cream Place 1 Applicatorful vaginally at bedtime. 08/13/17   Aviva Kluver B, PA-C  dicyclomine (BENTYL) 20 MG tablet Take 1 tablet (20 mg total) by mouth 4 (four) times daily -  before meals and at bedtime. 08/13/17 08/23/17  Aviva Kluver B, PA-C  fluticasone (FLONASE) 50 MCG/ACT nasal spray Place 2 sprays into both nostrils daily. 09/03/17   Cathie Hoops, Amy V, PA-C  gabapentin (NEURONTIN) 300 MG capsule Take 1 capsule (300 mg total) by mouth 3 (three) times daily. 05/30/17   Brock Bad, MD  HYDROcodone-acetaminophen (NORCO/VICODIN) 5-325 MG tablet Take 1-2 tablets by mouth every 6  (six) hours as needed for severe pain. Patient not taking: Reported on 08/11/2017 07/31/17   Anselm Pancoast, PA-C  ibuprofen (ADVIL,MOTRIN) 600 MG tablet Take 1 tablet (600 mg total) by mouth every 6 (six) hours as needed. Patient not taking: Reported on 09/16/2017 07/31/17   Joy, Hillard Danker, PA-C  ibuprofen (ADVIL,MOTRIN) 800 MG tablet Take 1 tablet (800 mg total) by mouth every 8 (eight) hours as needed. 08/22/17   Brock Bad, MD  methocarbamol (ROBAXIN) 500 MG tablet Take 1 tablet (500 mg total) by mouth at bedtime as needed for muscle spasms. 08/11/17   Mathews Robinsons B, PA-C  metroNIDAZOLE (METROGEL VAGINAL) 0.75 % vaginal gel Place 1 Applicatorful vaginally 2 (two) times daily. Patient not taking: Reported on 09/16/2017 05/30/17   Brock Bad, MD  ondansetron (ZOFRAN ODT) 4 MG disintegrating tablet Take 1 tablet (4 mg total) by mouth every 8 (eight) hours as needed for nausea or vomiting. Patient not taking: Reported on 09/16/2017 08/11/17   Georgiana Shore, PA-C  oxyCODONE-acetaminophen (PERCOCET) 7.5-325 MG tablet Take 1 tablet by mouth 3 (three) times daily as needed (pain).  07/27/17   [provider]  pantoprazole (PROTONIX) 40 MG tablet Take 1 tablet (40 mg total) daily by mouth. 09/20/17   Pricilla Loveless, MD  promethazine (PHENERGAN) 25 MG tablet Take 1 tablet (25 mg total) by mouth every 6 (six) hours as needed for nausea or vomiting. 05/27/17   Vanetta Mulders, MD  sertraline (ZOLOFT) 100 MG tablet Take 100 mg by mouth daily. 06/02/17   [provider]    Family History Family History  Problem Relation Age of Onset  . Heart disease Mother   . Cancer Maternal Grandmother        breast  . Cancer Paternal Grandmother        breast    Social History Social History   Tobacco Use  . Smoking status: Former Smoker    Years: 3.00    Types: Cigars    Last attempt to quit: 04/28/2017    Years since quitting: 0.3  . Smokeless tobacco: Never Used  . Tobacco  comment: 1-2 cig./day  Substance Use Topics  . Alcohol use: No    Alcohol/week: 0.0 oz    Comment: occ  . Drug use: No     Allergies   Latuda [lurasidone hcl]; Sulfa antibiotics; Bee venom; Flagyl [metronidazole]; Naproxen; Tramadol; and Toradol [ketorolac tromethamine]   Review of Systems Review of Systems  Constitutional: Negative for fever.  Respiratory: Negative for shortness of breath.   Cardiovascular: Negative for chest pain.  Gastrointestinal: Positive for abdominal pain and nausea. Negative for blood in stool, constipation, diarrhea and  vomiting.  Genitourinary: Positive for frequency and hematuria. Negative for dysuria and vaginal bleeding.  Musculoskeletal: Positive for back pain.  All other systems reviewed and are negative.    Physical Exam Updated Vital Signs BP 115/74   Pulse 68   Temp 98.4 F (36.9 C) (Oral)   Resp 18   Ht 5\' 7"  (1.702 m)   Wt 89.8 kg (198 lb)   SpO2 96%   BMI 31.01 kg/m   Physical Exam  Constitutional: She is oriented to person, place, and time. She appears well-developed and well-nourished. No distress.  Resting comfortably  HENT:  Head: Normocephalic and atraumatic.  Right Ear: External ear normal.  Left Ear: External ear normal.  Nose: Nose normal.  Eyes: Right eye exhibits no discharge. Left eye exhibits no discharge.  Cardiovascular: Normal rate, regular rhythm and normal heart sounds.  Pulmonary/Chest: Effort normal and breath sounds normal.  Abdominal: Soft. There is tenderness in the left upper quadrant. There is CVA tenderness (left sided).  Neurological: She is alert and oriented to person, place, and time.  Skin: Skin is warm and dry. She is not diaphoretic.  Nursing note and vitals reviewed.    ED Treatments / Results  Labs (all labs ordered are listed, but only abnormal results are displayed) Labs Reviewed  URINALYSIS, ROUTINE W REFLEX MICROSCOPIC - Abnormal; Notable for the following components:      Result  Value   Hgb urine dipstick MODERATE (*)    Bacteria, UA RARE (*)    Squamous Epithelial / LPF 0-5 (*)    All other components within normal limits  COMPREHENSIVE METABOLIC PANEL - Abnormal; Notable for the following components:   Calcium 8.7 (*)    Total Protein 5.8 (*)    Albumin 3.2 (*)    All other components within normal limits  CBC WITH DIFFERENTIAL/PLATELET - Abnormal; Notable for the following components:   MCV 77.5 (*)    All other components within normal limits  LIPASE, BLOOD  POC URINE PREG, ED  CBG MONITORING, ED    EKG  EKG Interpretation None       Radiology No results found.  Procedures Procedures (including critical care time)  Medications Ordered in ED Medications  sodium chloride 0.9 % bolus 1,000 mL (0 mLs Intravenous Stopped 09/20/17 1114)  fentaNYL (SUBLIMAZE) injection 100 mcg (100 mcg Intravenous Given 09/20/17 1029)  ondansetron (ZOFRAN) injection 4 mg (4 mg Intravenous Given 09/20/17 1029)  diphenhydrAMINE (BENADRYL) capsule 50 mg (50 mg Oral Given 09/20/17 1111)     Initial Impression / Assessment and Plan / ED Course  I have reviewed the triage vital signs and the nursing notes.  Pertinent labs & imaging results that were available during my care of the patient were reviewed by me and considered in my medical decision making (see chart for details).     Patient appears quite comfortable in the ED.  Her lab work is unremarkable.  She does have hematuria but no other concerning findings.  While she does have some left-sided back pain the pain more appears to be left-sided abdominal pain.  If there is no radiation of the pain.  Is not really in her flank and I think that despite the hematuria a ureteral/kidney stone would be unlikely.  She does not appear in severe pain and thus it is unlikely she would need a surgical intervention.  Thus imaging will be deferred at this time given otherwise benign exam.  Discussed need for follow-up with PCP.  Given the left upper quadrant nature, with alcohol abuse, will put on a PPI.  No vomiting.  She did have some itching but no rash after fentanyl and Zofran so she was given Benadryl.  Appears stable for discharge home with return precautions.  Also asks for a zoloft refill prescription as she is out. This was given to her  Final Clinical Impressions(s) / ED Diagnoses   Final diagnoses:  Left upper quadrant abdominal pain of unknown etiology  Gross hematuria    ED Discharge Orders        Ordered    pantoprazole (PROTONIX) 40 MG tablet  Daily     09/20/17 1211       Pricilla Loveless, MD 09/20/17 1242    Pricilla Loveless, MD 09/20/17 1245

## 2017-10-16 ENCOUNTER — Emergency Department (HOSPITAL_COMMUNITY)
Admission: EM | Admit: 2017-10-16 | Discharge: 2017-10-16 | Discharge status: Against Medical Advice | Payer: Medicaid Other | Attending: Emergency Medicine | Admitting: Emergency Medicine

## 2017-10-16 ENCOUNTER — Encounter (HOSPITAL_COMMUNITY): Payer: Self-pay | Admitting: Emergency Medicine

## 2017-10-16 DIAGNOSIS — Z87891 Personal history of nicotine dependence: Secondary | ICD-10-CM | POA: Insufficient documentation

## 2017-10-16 DIAGNOSIS — F1910 Other psychoactive substance abuse, uncomplicated: Secondary | ICD-10-CM | POA: Diagnosis not present

## 2017-10-16 DIAGNOSIS — Z5320 Procedure and treatment not carried out because of patient's decision for unspecified reasons: Secondary | ICD-10-CM | POA: Diagnosis not present

## 2017-10-16 DIAGNOSIS — Z79899 Other long term (current) drug therapy: Secondary | ICD-10-CM | POA: Insufficient documentation

## 2017-10-16 DIAGNOSIS — R197 Diarrhea, unspecified: Secondary | ICD-10-CM | POA: Diagnosis present

## 2017-10-16 LAB — LIPASE, BLOOD: Lipase: 30 U/L (ref 11–51)

## 2017-10-16 LAB — COMPREHENSIVE METABOLIC PANEL
ALT: 13 U/L — ABNORMAL LOW (ref 14–54)
AST: 16 U/L (ref 15–41)
Albumin: 3.4 g/dL — ABNORMAL LOW (ref 3.5–5.0)
Alkaline Phosphatase: 68 U/L (ref 38–126)
Anion gap: 7 (ref 5–15)
BUN: 9 mg/dL (ref 6–20)
CO2: 27 mmol/L (ref 22–32)
Calcium: 8.7 mg/dL — ABNORMAL LOW (ref 8.9–10.3)
Chloride: 106 mmol/L (ref 101–111)
Creatinine, Ser: 0.57 mg/dL (ref 0.44–1.00)
GFR calc Af Amer: >60 mL/min (ref >60)
GFR calc non Af Amer: >60 mL/min (ref >60)
Glucose, Bld: 89 mg/dL (ref 65–99)
Potassium: 3.5 mmol/L (ref 3.5–5.1)
Sodium: 140 mmol/L (ref 135–145)
Total Bilirubin: 0.5 mg/dL (ref 0.3–1.2)
Total Protein: 5.9 g/dL — ABNORMAL LOW (ref 6.5–8.1)

## 2017-10-16 LAB — CBC
HCT: 39 % (ref 36.0–46.0)
Hemoglobin: 13.3 g/dL (ref 12.0–15.0)
MCH: 26.9 pg (ref 26.0–34.0)
MCHC: 34.1 g/dL (ref 30.0–36.0)
MCV: 78.8 fL (ref 78.0–100.0)
Platelets: 244 10*3/uL (ref 150–400)
RBC: 4.95 MIL/uL (ref 3.87–5.11)
RDW: 13.6 % (ref 11.5–15.5)
WBC: 9 10*3/uL (ref 4.0–10.5)

## 2017-10-16 LAB — URINALYSIS, ROUTINE W REFLEX MICROSCOPIC
Bilirubin Urine: NEGATIVE
Glucose, UA: NEGATIVE mg/dL
Hgb urine dipstick: NEGATIVE
Ketones, ur: NEGATIVE mg/dL
Leukocytes, UA: NEGATIVE
Nitrite: NEGATIVE
Protein, ur: NEGATIVE mg/dL
Specific Gravity, Urine: 1.019 (ref 1.005–1.030)
pH: 5 (ref 5.0–8.0)

## 2017-10-16 LAB — I-STAT BETA HCG BLOOD, ED (MC, WL, AP ONLY): I-stat hCG, quantitative: <5 m[IU]/mL (ref <5)

## 2017-10-16 MED ORDER — ONDANSETRON 4 MG PO TBDP: 4.0000 mg | ORAL_TABLET | Freq: Once | ORAL | Status: DC

## 2017-10-16 MED ORDER — ONDANSETRON 4 MG PO TBDP: 4.0000 mg | ORAL_TABLET | Freq: Three times a day (TID) | ORAL | 0 refills | Status: DC | PRN

## 2017-10-16 NOTE — ED Notes (Signed)
Patient requests zofran and to leave. Writer explained they would be leaving against AMA and would notify MD. MD has been notified.

## 2017-10-16 NOTE — ED Provider Notes (Signed)
[facility]-EMERGENCY DEPT Provider Note   CSN: [id number] Arrival date & time: 10/16/17  1236     History   Chief Complaint Chief Complaint  Patient presents with  . Abdominal Pain    HPI Leah Barry is a 31 y.o. female.  HPI Patient presents with diarrhea and metallic taste in her mouth.  Some mild nausea.  States began after she smoked a lot of crack and drank a lot of alcohol.  Has continued to have the diarrhea.  Mildly decreased appetite.  No fevers.  No abdominal pain.  Slight headache.  Also fishy vaginal odor.  Also some white discharge.  Denies chance of STD. Past Medical History:  Diagnosis Date  . Anxiety   . Apnea, sleep    no CPAP use, states lost machine during move to [place]  . Bipolar 1 disorder (HCC)   . Bipolar disorder (HCC)    no current med.  . Depression   . History of MRSA infection 2010  . Irritable bowel syndrome (IBS)    no current med.  . Pelvic inflammatory disease   . Scalp laceration    staples to be removed 03/13/2014  . Ulnar fracture 03/09/2014   left    Patient Active Problem List   Diagnosis Date Noted  . Dysmenorrhea 05/28/2014  . GC (gonococcus) 05/28/2014  . Chlamydia contact 05/28/2014  . Pain 04/14/2014  . UTI symptoms 04/14/2014  . Routine general medical examination at a health care facility 09/26/2013  . Candidiasis of vulva and vagina 09/26/2013  . Unspecified symptom associated with female genital organs 09/10/2013  . Abnormal uterine bleeding (AUB) 09/10/2013  . PID (pelvic inflammatory disease) 07/29/2013  . Dysuria 07/29/2013  . Screening examination for venereal disease 07/29/2013  . Chronic PID (chronic pelvic inflammatory disease) 06/30/2013  . Excessive or frequent menstruation 06/30/2013  . Genital herpes, unspecified 06/30/2013  . Unspecified inflammatory disease of female pelvic organs and tissues 03/12/2013    Past Surgical History:  Procedure Laterality Date  . CYST EXCISION     middle of back  . ORIF ULNAR FRACTURE Left 03/16/2014   Procedure: OPEN REDUCTION INTERNAL FIXATION (ORIF) LEFT ULNAR FRACTURE;  Surgeon: Marlowe Shores, MD;  Location: [facility];  Service: Orthopedics;  Laterality: Left;  left  . OVARIAN CYST REMOVAL    . THYROID CYST EXCISION     nodule exc.  . TUBAL LIGATION      OB History    Gravida Para Term Preterm AB Living   5 4 4   1 4    SAB TAB Ectopic Multiple Live Births       1   4       Home Medications    Prior to Admission medications   Medication Sig Start Date End Date Taking? Authorizing Provider  ALPRAZolam Prudy Feeler) 1 MG tablet Take 1 mg by mouth at bedtime as needed for anxiety.     [provider]  cetirizine-pseudoephedrine (ZYRTEC-D) 5-120 MG tablet Take 1 tablet by mouth daily. Patient not taking: Reported on 09/16/2017 09/03/17   Belinda Fisher, PA-C  clindamycin (CLEOCIN) 2 % vaginal cream Place 1 Applicatorful vaginally at bedtime. 08/13/17   Aviva Kluver B, PA-C  dicyclomine (BENTYL) 20 MG tablet Take 1 tablet (20 mg total) by mouth 4 (four) times daily -  before meals and at bedtime. 08/13/17 08/23/17  Aviva Kluver B, PA-C  fluticasone (FLONASE) 50 MCG/ACT nasal spray Place 2 sprays into both nostrils  daily. 09/03/17   Cathie HoopsYu, Amy V, PA-C  gabapentin (NEURONTIN) 300 MG capsule Take 1 capsule (300 mg total) by mouth 3 (three) times daily. 05/30/17   Brock BadHarper, Charles A, MD  HYDROcodone-acetaminophen (NORCO/VICODIN) 5-325 MG tablet Take 1-2 tablets by mouth every 6 (six) hours as needed for severe pain. Patient not taking: Reported on 08/11/2017 07/31/17   Anselm PancoastJoy, Shawn C, PA-C  ibuprofen (ADVIL,MOTRIN) 600 MG tablet Take 1 tablet (600 mg total) by mouth every 6 (six) hours as needed. Patient not taking: Reported on 09/16/2017 07/31/17   Joy, Hillard DankerShawn C, PA-C  ibuprofen (ADVIL,MOTRIN) 800 MG tablet Take 1 tablet (800 mg total) by mouth every 8 (eight) hours as needed. 08/22/17   Brock BadHarper, Charles A, MD  methocarbamol  (ROBAXIN) 500 MG tablet Take 1 tablet (500 mg total) by mouth at bedtime as needed for muscle spasms. 08/11/17   Mathews RobinsonsMitchell, Jessica B, PA-C  metroNIDAZOLE (METROGEL VAGINAL) 0.75 % vaginal gel Place 1 Applicatorful vaginally 2 (two) times daily. Patient not taking: Reported on 09/16/2017 05/30/17   Brock BadHarper, Charles A, MD  ondansetron (ZOFRAN-ODT) 4 MG disintegrating tablet Take 1 tablet (4 mg total) by mouth every 8 (eight) hours as needed for nausea or vomiting. 10/16/17   Benjiman CorePickering, Lynnett Langlinais, MD  oxyCODONE-acetaminophen (PERCOCET) 7.5-325 MG tablet Take 1 tablet by mouth 3 (three) times daily as needed (pain).  07/27/17   [provider]  pantoprazole (PROTONIX) 40 MG tablet Take 1 tablet (40 mg total) daily by mouth. 09/20/17   Pricilla LovelessGoldston, Scott, MD  promethazine (PHENERGAN) 25 MG tablet Take 1 tablet (25 mg total) by mouth every 6 (six) hours as needed for nausea or vomiting. 05/27/17   Vanetta MuldersZackowski, Scott, MD  sertraline (ZOLOFT) 100 MG tablet Take 1 tablet (100 mg total) daily by mouth. 09/20/17   Pricilla LovelessGoldston, Scott, MD    Family History Family History  Problem Relation Age of Onset  . Heart disease Mother   . Cancer Maternal Grandmother        breast  . Cancer Paternal Grandmother        breast    Social History Social History   Tobacco Use  . Smoking status: Former Smoker    Years: 3.00    Types: Cigars    Last attempt to quit: 04/28/2017    Years since quitting: 0.4  . Smokeless tobacco: Never Used  . Tobacco comment: 1-2 cig./day  Substance Use Topics  . Alcohol use: No    Alcohol/week: 0.0 oz    Comment: occ  . Drug use: No     Allergies   Latuda [lurasidone hcl]; Sulfa antibiotics; Bee venom; Flagyl [metronidazole]; Naproxen; Tramadol; and Toradol [ketorolac tromethamine]   Review of Systems Review of Systems  Constitutional: Positive for appetite change. Negative for fever.  HENT: Negative for congestion.   Respiratory: Negative for shortness of breath.     Cardiovascular: Negative for chest pain.  Gastrointestinal: Positive for diarrhea and nausea. Negative for abdominal pain.  Endocrine: Negative for polyuria.  Genitourinary: Positive for vaginal discharge. Negative for flank pain.  Musculoskeletal: Negative for back pain.  Skin: Negative for rash.  Neurological: Negative for seizures.  Psychiatric/Behavioral: Negative for confusion.     Physical Exam Updated Vital Signs BP (!) 158/114 (BP Location: Left Arm)   Pulse 78   Temp 97.8 F (36.6 C) (Oral)   Resp 20   Ht 5\' 7"  (1.702 m)   Wt 89.8 kg (198 lb)   SpO2 98%   BMI 31.01 kg/m  Physical Exam  Constitutional: She appears well-developed.  HENT:  Head: Atraumatic.  Mouth/Throat: No oropharyngeal exudate.  Eyes: Pupils are equal, round, and reactive to light.  Cardiovascular: Regular rhythm.  Pulmonary/Chest: Breath sounds normal.  Abdominal: Normal appearance. There is no tenderness.  Neurological: She is alert.  Skin: Skin is warm. Capillary refill takes less than 2 seconds.     ED Treatments / Results  Labs (all labs ordered are listed, but only abnormal results are displayed) Labs Reviewed  COMPREHENSIVE METABOLIC PANEL - Abnormal; Notable for the following components:      Result Value   Calcium 8.7 (*)    Total Protein 5.9 (*)    Albumin 3.4 (*)    ALT 13 (*)    All other components within normal limits  LIPASE, BLOOD  CBC  URINALYSIS, ROUTINE W REFLEX MICROSCOPIC  I-STAT BETA HCG BLOOD, ED ([facility], [facility], [facility] ONLY)  GC/CHLAMYDIA PROBE AMP ([place]) NOT AT [facility]    EKG  EKG Interpretation None       Radiology No results found.  Procedures Procedures (including critical care time)  Medications Ordered in ED Medications  ondansetron (ZOFRAN-ODT) disintegrating tablet 4 mg (not administered)     Initial Impression / Assessment and Plan / ED Course  I have reviewed the triage vital signs and the nursing notes.  Pertinent labs & imaging  results that were available during my care of the patient were reviewed by me and considered in my medical decision making (see chart for details).      Patient with diarrhea and metallic taste in her mouth.  Labs reassuring.  Also reports vaginal odor.  Was going to do more of a GU workup but patient later refused.  Discharge home with Zofran.  Final Clinical Impressions(s) / ED Diagnoses   Final diagnoses:  Diarrhea, unspecified type  Substance abuse [facility](HCC)    ED Discharge Orders        Ordered    ondansetron (ZOFRAN-ODT) 4 MG disintegrating tablet  Every 8 hours PRN     10/16/17 2031       Benjiman CorePickering, Ludella Pranger, MD 10/16/17 2034

## 2017-10-16 NOTE — ED Notes (Signed)
Pt verbalizes she has been doing cocaine, and etoh  Passed out after using it, started having body jerking Saw black spots, legs stared burning, mouth numb and metallic taste.   Stomach pain. And metal  taste

## 2017-10-16 NOTE — ED Triage Notes (Signed)
Patient presents A&0x4 reporting last week she went on a three day binge of drinking and partying. States she woke up after with bilateral leg pain with a burning sensation and abdominal pain. Pt states she woke up with a metallic taste in her mouth. When questioned pt unsure if she took any drugs because "I was messed up."

## 2017-10-16 NOTE — ED Notes (Signed)
Pt walked out, pts visitor and pt started auguring. Not additional information know Pt left ama
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
## 2017-12-07 ENCOUNTER — Emergency Department (HOSPITAL_COMMUNITY)
Admission: EM | Admit: 2017-12-07 | Discharge: 2017-12-07 | Discharge status: Against Medical Advice | Payer: Medicaid Other

## 2017-12-07 NOTE — ED Notes (Signed)
Patient left due to not wanting to wait.

## 2017-12-07 NOTE — ED Notes (Signed)
Pt left stating if she couldn't be seen now, she did not want to wait.

## 2018-01-29 ENCOUNTER — Encounter (HOSPITAL_COMMUNITY): Payer: Self-pay

## 2018-01-29 ENCOUNTER — Other Ambulatory Visit: Payer: Self-pay

## 2018-01-29 ENCOUNTER — Emergency Department (HOSPITAL_COMMUNITY)
Admission: EM | Admit: 2018-01-29 | Discharge: 2018-01-29 | Disposition: A | Discharge status: Home / Self Care | Payer: Medicaid Other | Attending: Emergency Medicine | Admitting: Emergency Medicine

## 2018-01-29 DIAGNOSIS — F419 Anxiety disorder, unspecified: Secondary | ICD-10-CM | POA: Insufficient documentation

## 2018-01-29 DIAGNOSIS — N760 Acute vaginitis: Secondary | ICD-10-CM | POA: Diagnosis not present

## 2018-01-29 DIAGNOSIS — N898 Other specified noninflammatory disorders of vagina: Secondary | ICD-10-CM | POA: Diagnosis present

## 2018-01-29 DIAGNOSIS — F319 Bipolar disorder, unspecified: Secondary | ICD-10-CM | POA: Insufficient documentation

## 2018-01-29 DIAGNOSIS — N730 Acute parametritis and pelvic cellulitis: Secondary | ICD-10-CM

## 2018-01-29 DIAGNOSIS — N739 Female pelvic inflammatory disease, unspecified: Secondary | ICD-10-CM | POA: Diagnosis not present

## 2018-01-29 DIAGNOSIS — Z87891 Personal history of nicotine dependence: Secondary | ICD-10-CM | POA: Insufficient documentation

## 2018-01-29 DIAGNOSIS — B9689 Other specified bacterial agents as the cause of diseases classified elsewhere: Secondary | ICD-10-CM

## 2018-01-29 LAB — WET PREP, GENITAL
Sperm: NONE SEEN
Trich, Wet Prep: NONE SEEN
Yeast Wet Prep HPF POC: NONE SEEN

## 2018-01-29 LAB — COMPREHENSIVE METABOLIC PANEL
ALT: 10 U/L — ABNORMAL LOW (ref 14–54)
AST: 13 U/L — ABNORMAL LOW (ref 15–41)
Albumin: 3.4 g/dL — ABNORMAL LOW (ref 3.5–5.0)
Alkaline Phosphatase: 63 U/L (ref 38–126)
Anion gap: 7 (ref 5–15)
BUN: 14 mg/dL (ref 6–20)
CO2: 25 mmol/L (ref 22–32)
Calcium: 8.5 mg/dL — ABNORMAL LOW (ref 8.9–10.3)
Chloride: 107 mmol/L (ref 101–111)
Creatinine, Ser: 0.58 mg/dL (ref 0.44–1.00)
GFR calc Af Amer: >60 mL/min (ref >60)
GFR calc non Af Amer: >60 mL/min (ref >60)
Glucose, Bld: 103 mg/dL — ABNORMAL HIGH (ref 65–99)
Potassium: 3.9 mmol/L (ref 3.5–5.1)
Sodium: 139 mmol/L (ref 135–145)
Total Bilirubin: <0.1 mg/dL — ABNORMAL LOW (ref 0.3–1.2)
Total Protein: 5.7 g/dL — ABNORMAL LOW (ref 6.5–8.1)

## 2018-01-29 LAB — CBC
HCT: 43.7 % (ref 36.0–46.0)
Hemoglobin: 15.2 g/dL — ABNORMAL HIGH (ref 12.0–15.0)
MCH: 27.7 pg (ref 26.0–34.0)
MCHC: 34.8 g/dL (ref 30.0–36.0)
MCV: 79.6 fL (ref 78.0–100.0)
Platelets: 278 10*3/uL (ref 150–400)
RBC: 5.49 MIL/uL — ABNORMAL HIGH (ref 3.87–5.11)
RDW: 13.5 % (ref 11.5–15.5)
WBC: 10.3 10*3/uL (ref 4.0–10.5)

## 2018-01-29 LAB — I-STAT BETA HCG BLOOD, ED (MC, WL, AP ONLY): I-stat hCG, quantitative: <5 m[IU]/mL (ref <5)

## 2018-01-29 LAB — LIPASE, BLOOD: Lipase: 31 U/L (ref 11–51)

## 2018-01-29 MED ORDER — DOXYCYCLINE HYCLATE 100 MG PO CAPS: 100.0000 mg | ORAL_CAPSULE | Freq: Two times a day (BID) | ORAL | 0 refills | Status: AC

## 2018-01-29 MED ORDER — ONDANSETRON HCL 4 MG PO TABS: 4.0000 mg | ORAL_TABLET | Freq: Three times a day (TID) | ORAL | 0 refills | Status: DC | PRN

## 2018-01-29 MED ORDER — DOXYCYCLINE HYCLATE 100 MG PO TABS
100.0000 mg | ORAL_TABLET | Freq: Once | ORAL | Status: AC
  Administered 2018-01-29: 100 mg via ORAL
  Filled 2018-01-29: qty 1

## 2018-01-29 MED ORDER — IBUPROFEN 800 MG PO TABS
800.0000 mg | ORAL_TABLET | Freq: Once | ORAL | Status: AC
  Administered 2018-01-29: 800 mg via ORAL
  Filled 2018-01-29: qty 1

## 2018-01-29 MED ORDER — ONDANSETRON HCL 4 MG PO TABS
4.0000 mg | ORAL_TABLET | Freq: Once | ORAL | Status: AC
  Administered 2018-01-29: 4 mg via ORAL
  Filled 2018-01-29: qty 1

## 2018-01-29 MED ORDER — CEFTRIAXONE SODIUM 250 MG IJ SOLR
250.0000 mg | Freq: Once | INTRAMUSCULAR | Status: AC
  Administered 2018-01-29: 250 mg via INTRAMUSCULAR
  Filled 2018-01-29: qty 250

## 2018-01-29 MED ORDER — AZITHROMYCIN 250 MG PO TABS: 1000.0000 mg | ORAL_TABLET | Freq: Once | ORAL | Status: DC

## 2018-01-29 NOTE — Discharge Instructions (Addendum)
Take antibiotics as prescribed.  Take the entire course, even if your symptoms improve. Use MetroGel for the next week for treatment of BV. There is information about the [facility] below, you should follow-up with them for further evaluation of your vaginal discharge and OB/GYN concerns.  You have gonorrhea and chlamydia tests pending, the results should return in approximately 3 days.  You will receive a phone call if they are positive, you will not hear anything if they are negative.  You may check online on my chart to see the results either way. Take ibuprofen 3 times a day with meals.  Do not take other anti-inflammatories at the same time open (Advil, Motrin, naproxen, Aleve). You may supplement with Tylenol if you need further pain control. Return to the emergency room if you develop fevers, continued discharge after antibiotic treatment, or any new or concerning symptoms.

## 2018-01-29 NOTE — ED Triage Notes (Addendum)
Patient reports that she is out of Xanax 1.0 and oxycodone 7.5 mg. Patient states her physician is no longer practicing and therefore refills at pharmacy were no longer valid. Patient also c/o mid abdominal pain and nausea.  Patient reports a foul urine odor and frequent urination. Patient states she also has a foul vaginal odor, but denies any vaginal discharge

## 2018-01-29 NOTE — ED Provider Notes (Signed)
I assumed care for this pt from Alden ServerS Jacubowitz, MD. Please see previous notes for further history.   In brief, patient presenting for evaluation of vaginal discharge.  Chaperone present during pelvic, which shows yellowish discharge from the cervix CMT.  No adnexal tenderness.  No obvious lesions, masses, or bleeding.  Patient requesting pain medicine, will give ibuprofen. Wet prep and gc/cl sent.  Patient very upset that she will not receive narcotic pain medicine.  I believe she is pain seeking at this time.  Wet prep positive for white cells and clue cells.  Discussed findings with patient.  Patient states she has MetroGel at home, will treat her BV with this.  I discussed that patient appears very tender on exam, and I am concerned that she may have PID.  No leukocytosis, and vitals are stable, I do not believe she is septic from this infection at this time, and does not require inpatient tx. Will treat with Rocephin and doxycycline in ER, and doxycycline outpatient.  Patient to continue to use ibuprofen for pain.  As patient is displaying drug-seeking behavior and being verbally aggressive with the staff when she is denied narcotics, I will not give pt narcotics for outpatient use. Dr. Ethelda ChickJacubowitz agrees to plan.  Pt requesting zofran for home, will rx for zofran. She is tolerating PO without difficulty at this time. At this time, patient appears safe for discharge.  Return precautions given.  Patient states she understands and agrees to plan.   Alveria ApleyCaccavale, Leah Matusik, PA-C 01/29/18 2347    Doug SouJacubowitz, Sam, MD 01/30/18 [phone]

## 2018-01-29 NOTE — ED Notes (Addendum)
Patient refused blood draw for further tests. Patient aware of what they are for and stated "No, I don't need that". Patient verbalized being upset for not receiving a refill for her xanax and oxycodone, this nurse listened to patients concerns and the provider came and spoke to the patient but patient was still dissatisfied.

## 2018-01-29 NOTE — ED Notes (Signed)
Discharged delay due to trying to obtain patient experience number. Card obtained and given to patient.

## 2018-01-29 NOTE — ED Provider Notes (Signed)
[facility]-EMERGENCY DEPT Provider Note   CSN: [id number] Arrival date & time: 01/29/18  1717     History   Chief Complaint Chief Complaint  Patient presents with  . Medication Refill  . Abdominal Pain  . Dysuria  . Urinary Frequency    HPI Leah Barry is a 32 y.o. female.  Patient requesting refills for Xanax and oxycodone stating that she ran out several days ago..  She formally was seen by Dr. Ronne Binning whose medical license was revoked recently..  Patient received prescription for 90 alprazolam tablets on 12/26/2017 and 90 Percocet 7.5 tablets on 12/04/2017.  [place] Controlled Substance reporting System queried She is also reporting vaginal discharge for the past several days and lower abdominal pain.  Last normal menstrual period 1 week ago. HPI  Past Medical History:  Diagnosis Date  . Anxiety   . Apnea, sleep    no CPAP use, states lost machine during move to [place]  . Bipolar 1 disorder (HCC)   . Bipolar disorder (HCC)    no current med.  . Depression   . History of MRSA infection 2010  . Irritable bowel syndrome (IBS)    no current med.  . Pelvic inflammatory disease   . Scalp laceration    staples to be removed 03/13/2014  . Ulnar fracture 03/09/2014   left    Patient Active Problem List   Diagnosis Date Noted  . Dysmenorrhea 05/28/2014  . GC (gonococcus) 05/28/2014  . Chlamydia contact 05/28/2014  . Pain 04/14/2014  . UTI symptoms 04/14/2014  . Routine general medical examination at a health care facility 09/26/2013  . Candidiasis of vulva and vagina 09/26/2013  . Unspecified symptom associated with female genital organs 09/10/2013  . Abnormal uterine bleeding (AUB) 09/10/2013  . PID (pelvic inflammatory disease) 07/29/2013  . Dysuria 07/29/2013  . Screening examination for venereal disease 07/29/2013  . Chronic PID (chronic pelvic inflammatory disease) 06/30/2013  . Excessive or frequent menstruation 06/30/2013  . Genital  herpes, unspecified 06/30/2013  . Unspecified inflammatory disease of female pelvic organs and tissues 03/12/2013    Past Surgical History:  Procedure Laterality Date  . CYST EXCISION     middle of back  . ORIF ULNAR FRACTURE Left 03/16/2014   Procedure: OPEN REDUCTION INTERNAL FIXATION (ORIF) LEFT ULNAR FRACTURE;  Surgeon: Marlowe Shores, MD;  Location: [facility];  Service: Orthopedics;  Laterality: Left;  left  . OVARIAN CYST REMOVAL    . THYROID CYST EXCISION     nodule exc.  . TUBAL LIGATION      OB History    Gravida Para Term Preterm AB Living   5 4 4   1 4    SAB TAB Ectopic Multiple Live Births       1   4       Home Medications    Prior to Admission medications   Medication Sig Start Date End Date Taking? Authorizing Provider  ALPRAZolam Prudy Feeler) 1 MG tablet Take 1 mg by mouth at bedtime as needed for anxiety.    Yes [provider]  ibuprofen (ADVIL,MOTRIN) 800 MG tablet Take 1 tablet (800 mg total) by mouth every 8 (eight) hours as needed. 08/22/17  Yes Brock Bad, MD  oxyCODONE-acetaminophen (PERCOCET) 7.5-325 MG tablet Take 1 tablet by mouth 3 (three) times daily as needed (pain).  07/27/17  Yes [provider]  cetirizine-pseudoephedrine (ZYRTEC-D) 5-120 MG tablet Take 1 tablet by mouth daily. Patient not  taking: Reported on 09/16/2017 09/03/17   Belinda Fisher, PA-C  clindamycin (CLEOCIN) 2 % vaginal cream Place 1 Applicatorful vaginally at bedtime. Patient taking differently: Place 1 Applicatorful vaginally 3 times/day as needed-between meals & bedtime (itching).  08/13/17   Aviva Kluver B, PA-C  dicyclomine (BENTYL) 20 MG tablet Take 1 tablet (20 mg total) by mouth 4 (four) times daily -  before meals and at bedtime. 08/13/17 08/23/17  Aviva Kluver B, PA-C  fluticasone (FLONASE) 50 MCG/ACT nasal spray Place 2 sprays into both nostrils daily. Patient not taking: Reported on 01/29/2018 09/03/17   Belinda Fisher, PA-C  gabapentin  (NEURONTIN) 300 MG capsule Take 1 capsule (300 mg total) by mouth 3 (three) times daily. Patient taking differently: Take 300 mg by mouth 3 (three) times daily as needed (pain).  05/30/17   Brock Bad, MD  HYDROcodone-acetaminophen (NORCO/VICODIN) 5-325 MG tablet Take 1-2 tablets by mouth every 6 (six) hours as needed for severe pain. Patient not taking: Reported on 08/11/2017 07/31/17   Anselm Pancoast, PA-C  ibuprofen (ADVIL,MOTRIN) 600 MG tablet Take 1 tablet (600 mg total) by mouth every 6 (six) hours as needed. Patient not taking: Reported on 09/16/2017 07/31/17   Joy, Hillard Danker, PA-C  methocarbamol (ROBAXIN) 500 MG tablet Take 1 tablet (500 mg total) by mouth at bedtime as needed for muscle spasms. Patient not taking: Reported on 01/29/2018 08/11/17   Mathews Robinsons B, PA-C  metroNIDAZOLE (METROGEL VAGINAL) 0.75 % vaginal gel Place 1 Applicatorful vaginally 2 (two) times daily. Patient not taking: Reported on 09/16/2017 05/30/17   Brock Bad, MD  ondansetron (ZOFRAN-ODT) 4 MG disintegrating tablet Take 1 tablet (4 mg total) by mouth every 8 (eight) hours as needed for nausea or vomiting. Patient not taking: Reported on 01/29/2018 10/16/17   Benjiman Core, MD  pantoprazole (PROTONIX) 40 MG tablet Take 1 tablet (40 mg total) daily by mouth. Patient not taking: Reported on 01/29/2018 09/20/17   Pricilla Loveless, MD  promethazine (PHENERGAN) 25 MG tablet Take 1 tablet (25 mg total) by mouth every 6 (six) hours as needed for nausea or vomiting. Patient not taking: Reported on 01/29/2018 05/27/17   Vanetta Mulders, MD  sertraline (ZOLOFT) 100 MG tablet Take 1 tablet (100 mg total) daily by mouth. Patient not taking: Reported on 01/29/2018 09/20/17   Pricilla Loveless, MD    Family History Family History  Problem Relation Age of Onset  . Heart disease Mother   . Cancer Maternal Grandmother        breast  . Cancer Paternal Grandmother        breast    Social History Social History    Tobacco Use  . Smoking status: Former Smoker    Years: 3.00    Types: Cigars    Last attempt to quit: 04/28/2017    Years since quitting: 0.7  . Smokeless tobacco: Never Used  . Tobacco comment: 1-2 cig./day  Substance Use Topics  . Alcohol use: No    Alcohol/week: 0.0 oz    Comment: occ  . Drug use: No     Allergies   Latuda [lurasidone hcl]; Sulfa antibiotics; Bee venom; Flagyl [metronidazole]; Naproxen; Tramadol; and Toradol [ketorolac tromethamine]   Review of Systems Review of Systems  Gastrointestinal: Positive for abdominal pain.  Genitourinary: Positive for vaginal discharge.  Psychiatric/Behavioral: The patient is nervous/anxious.   All other systems reviewed and are negative.    Physical Exam Updated Vital Signs BP 121/83 (BP Location: Left Arm)  Pulse 80   Temp 98 F (36.7 C) (Oral)   Resp 16   Ht 5\' 7"  (1.702 m)   Wt 79.4 kg (175 lb)   SpO2 97%   BMI 27.41 kg/m   Physical Exam  Constitutional: She appears well-developed and well-nourished. No distress.  HENT:  Head: Normocephalic and atraumatic.  Eyes: Conjunctivae are normal. Pupils are equal, round, and reactive to light.  Neck: Neck supple. No tracheal deviation present. No thyromegaly present.  Cardiovascular: Normal rate and regular rhythm.  No murmur heard. Pulmonary/Chest: Effort normal and breath sounds normal.  Abdominal: Soft. Bowel sounds are normal. She exhibits no distension. There is no tenderness.  Musculoskeletal: Normal range of motion. She exhibits no edema or tenderness.  Neurological: She is alert. Coordination normal.  Skin: Skin is warm and dry. No rash noted.  Psychiatric: She has a normal mood and affect.  Nursing note and vitals reviewed.  Pelvic exam performed by [facility]  ED Treatments / Results  Labs (all labs ordered are listed, but only abnormal results are displayed) Labs Reviewed  CBC - Abnormal; Notable for the following components:      Result Value    RBC 5.49 (*)    Hemoglobin 15.2 (*)    All other components within normal limits  LIPASE, BLOOD  COMPREHENSIVE METABOLIC PANEL  URINALYSIS, ROUTINE W REFLEX MICROSCOPIC  I-STAT BETA HCG BLOOD, ED ([facility], [facility], [facility] ONLY)    EKG  EKG Interpretation None       Radiology No results found.  Procedures Procedures (including critical care time)  Medications Ordered in ED Medications - No data to display  Results for orders placed or performed during the hospital encounter of 01/29/18  Wet prep, genital  Result Value Ref Range   Yeast Wet Prep HPF POC NONE SEEN NONE SEEN   Trich, Wet Prep NONE SEEN NONE SEEN   Clue Cells Wet Prep HPF POC PRESENT (A) NONE SEEN   WBC, Wet Prep HPF POC FEW (A) NONE SEEN   Sperm NONE SEEN   Lipase, blood  Result Value Ref Range   Lipase 31 11 - 51 U/L  Comprehensive metabolic panel  Result Value Ref Range   Sodium 139 135 - 145 mmol/L   Potassium 3.9 3.5 - 5.1 mmol/L   Chloride 107 101 - 111 mmol/L   CO2 25 22 - 32 mmol/L   Glucose, Bld 103 (H) 65 - 99 mg/dL   BUN 14 6 - 20 mg/dL   Creatinine, Ser 1.610.58 0.44 - 1.00 mg/dL   Calcium 8.5 (L) 8.9 - 10.3 mg/dL   Total Protein 5.7 (L) 6.5 - 8.1 g/dL   Albumin 3.4 (L) 3.5 - 5.0 g/dL   AST 13 (L) 15 - 41 U/L   ALT 10 (L) 14 - 54 U/L   Alkaline Phosphatase 63 38 - 126 U/L   Total Bilirubin <0.1 (L) 0.3 - 1.2 mg/dL   GFR calc non Af Amer >60 >60 mL/min   GFR calc Af Amer >60 >60 mL/min   Anion gap 7 5 - 15  CBC  Result Value Ref Range   WBC 10.3 4.0 - 10.5 K/uL   RBC 5.49 (H) 3.87 - 5.11 MIL/uL   Hemoglobin 15.2 (H) 12.0 - 15.0 g/dL   HCT 09.643.7 04.536.0 - 40.946.0 %   MCV 79.6 78.0 - 100.0 fL   MCH 27.7 26.0 - 34.0 pg   MCHC 34.8 30.0 - 36.0 g/dL   RDW 81.113.5 91.411.5 - 78.215.5 %  Platelets 278 150 - 400 K/uL  I-Stat beta hCG blood, ED  Result Value Ref Range   I-stat hCG, quantitative <5.0 <5 mIU/mL   Comment 3           No results found. Initial Impression / Assessment and Plan / ED Course  I have  reviewed the triage vital signs and the nursing notes.  Pertinent labs & imaging results that were available during my care of the patient were reviewed by me and considered in my medical decision making (see chart for details).     Patient requests another provider to perform her pelvic exam.  She became distraught when I explained that Icould not give refills for  controlled substances.  She is exhibiting no signs of withdrawal Patient treated for cervicitis.  Reportedly has MetroGel at home.  She will be referred to primary care and women's outpatient clinic Final Clinical Impressions(s) / ED Diagnoses  Dx PID Final diagnoses:  None    ED Discharge Orders    None       Doug Sou, MD 01/29/18 2201

## 2018-01-30 ENCOUNTER — Emergency Department (HOSPITAL_COMMUNITY)
Admission: EM | Admit: 2018-01-30 | Discharge: 2018-01-30 | Disposition: A | Discharge status: Home / Self Care | Payer: Medicaid Other | Attending: Emergency Medicine | Admitting: Emergency Medicine

## 2018-01-30 ENCOUNTER — Encounter (HOSPITAL_COMMUNITY): Payer: Self-pay | Admitting: Emergency Medicine

## 2018-01-30 ENCOUNTER — Ambulatory Visit (HOSPITAL_COMMUNITY)
Admission: EM | Admit: 2018-01-30 | Discharge: 2018-01-30 | Disposition: A | Discharge status: Home / Self Care | Payer: Medicaid Other | Attending: Family Medicine | Admitting: Family Medicine

## 2018-01-30 ENCOUNTER — Other Ambulatory Visit: Payer: Self-pay

## 2018-01-30 DIAGNOSIS — G8929 Other chronic pain: Secondary | ICD-10-CM

## 2018-01-30 DIAGNOSIS — N739 Female pelvic inflammatory disease, unspecified: Secondary | ICD-10-CM | POA: Diagnosis not present

## 2018-01-30 DIAGNOSIS — Z87891 Personal history of nicotine dependence: Secondary | ICD-10-CM | POA: Diagnosis not present

## 2018-01-30 DIAGNOSIS — Z760 Encounter for issue of repeat prescription: Secondary | ICD-10-CM

## 2018-01-30 DIAGNOSIS — R300 Dysuria: Secondary | ICD-10-CM | POA: Diagnosis not present

## 2018-01-30 LAB — URINALYSIS, ROUTINE W REFLEX MICROSCOPIC
Bilirubin Urine: NEGATIVE
Glucose, UA: NEGATIVE mg/dL
Hgb urine dipstick: NEGATIVE
Ketones, ur: NEGATIVE mg/dL
Leukocytes, UA: NEGATIVE
Nitrite: NEGATIVE
Protein, ur: NEGATIVE mg/dL
Specific Gravity, Urine: 1.029 (ref 1.005–1.030)
pH: 5 (ref 5.0–8.0)

## 2018-01-30 LAB — GC/CHLAMYDIA PROBE AMP (~~LOC~~) NOT AT ARMC
Chlamydia: NEGATIVE
Neisseria Gonorrhea: NEGATIVE

## 2018-01-30 MED ORDER — OXYCODONE-ACETAMINOPHEN 7.5-325 MG PO TABS: 1.0000 | ORAL_TABLET | Freq: Three times a day (TID) | ORAL | 0 refills | Status: DC | PRN

## 2018-01-30 MED ORDER — ONDANSETRON 4 MG PO TBDP
4.0000 mg | ORAL_TABLET | Freq: Once | ORAL | Status: AC
  Administered 2018-01-30: 4 mg via ORAL
  Filled 2018-01-30: qty 1

## 2018-01-30 MED ORDER — ALPRAZOLAM 1 MG PO TABS: 1.0000 mg | ORAL_TABLET | Freq: Every evening | ORAL | 0 refills | Status: DC | PRN

## 2018-01-30 NOTE — Discharge Instructions (Signed)
We have refilled your medicine this one time. Further refills will need to be given by a primary health care provider's office. In the future, please plan ahead so you do not need to get refills from the emergency department or urgent care.

## 2018-01-30 NOTE — ED Notes (Signed)
Pelvic Cart at room entrance.

## 2018-01-30 NOTE — ED Notes (Signed)
Patient was informed it would be an hour to speak with social worker and patient states she could not wait that long. Patient threw discharge papers away when leaving.

## 2018-01-30 NOTE — ED Notes (Signed)
Bed: [facility] Expected date:  Expected time:  Means of arrival:  Comments: 

## 2018-01-30 NOTE — ED Triage Notes (Signed)
Patient requesting refill of medications.  Requesting oxycodone and xanax--pcp was dr Ronne Binningmckenzie.

## 2018-01-30 NOTE — ED Provider Notes (Signed)
[facility]-EMERGENCY DEPT Provider Note   CSN: [id number] Arrival date & time: 01/30/18  [id number]     History   Chief Complaint Chief Complaint  Patient presents with  . Abdominal Pain  . Dysuria    HPI Leah Barry is a 32 y.o. female.  32 year old female presents with dysuria.  Seen several hours ago and diagnosed with PID.  No urinalysis obtained at that time.  She denies any fever or flank pain.  No new vaginal bleeding or discharge.  Has not had her prescriptions filled yet.  No treatment used prior to arrival.      Past Medical History:  Diagnosis Date  . Anxiety   . Apnea, sleep    no CPAP use, states lost machine during move to [place]  . Bipolar 1 disorder (HCC)   . Bipolar disorder (HCC)    no current med.  . Depression   . History of MRSA infection 2010  . Irritable bowel syndrome (IBS)    no current med.  . Pelvic inflammatory disease   . Scalp laceration    staples to be removed 03/13/2014  . Ulnar fracture 03/09/2014   left    Patient Active Problem List   Diagnosis Date Noted  . Dysmenorrhea 05/28/2014  . GC (gonococcus) 05/28/2014  . Chlamydia contact 05/28/2014  . Pain 04/14/2014  . UTI symptoms 04/14/2014  . Routine general medical examination at a health care facility 09/26/2013  . Candidiasis of vulva and vagina 09/26/2013  . Unspecified symptom associated with female genital organs 09/10/2013  . Abnormal uterine bleeding (AUB) 09/10/2013  . PID (pelvic inflammatory disease) 07/29/2013  . Dysuria 07/29/2013  . Screening examination for venereal disease 07/29/2013  . Chronic PID (chronic pelvic inflammatory disease) 06/30/2013  . Excessive or frequent menstruation 06/30/2013  . Genital herpes, unspecified 06/30/2013  . Unspecified inflammatory disease of female pelvic organs and tissues 03/12/2013    Past Surgical History:  Procedure Laterality Date  . CYST EXCISION     middle of back  . ORIF ULNAR FRACTURE Left  03/16/2014   Procedure: OPEN REDUCTION INTERNAL FIXATION (ORIF) LEFT ULNAR FRACTURE;  Surgeon: Marlowe ShoresMatthew A Weingold, MD;  Location: [facility];  Service: Orthopedics;  Laterality: Left;  left  . OVARIAN CYST REMOVAL    . THYROID CYST EXCISION     nodule exc.  . TUBAL LIGATION      OB History    Gravida Para Term Preterm AB Living   5 4 4   1 4    SAB TAB Ectopic Multiple Live Births       1   4       Home Medications    Prior to Admission medications   Medication Sig Start Date End Date Taking? Authorizing Provider  ALPRAZolam Prudy Feeler(XANAX) 1 MG tablet Take 1 mg by mouth at bedtime as needed for anxiety.    Yes [provider]  ibuprofen (ADVIL,MOTRIN) 800 MG tablet Take 1 tablet (800 mg total) by mouth every 8 (eight) hours as needed. 08/22/17  Yes Brock BadHarper, Charles A, MD  oxyCODONE-acetaminophen (PERCOCET) 7.5-325 MG tablet Take 1 tablet by mouth 3 (three) times daily as needed (pain).  07/27/17  Yes [provider]  cetirizine-pseudoephedrine (ZYRTEC-D) 5-120 MG tablet Take 1 tablet by mouth daily. Patient not taking: Reported on 01/30/2018 09/03/17   Belinda FisherYu, Amy V, PA-C  clindamycin (CLEOCIN) 2 % vaginal cream Place 1 Applicatorful vaginally at bedtime. Patient not taking: Reported on 01/30/2018 08/13/17  Dayton Scrape, Alyssa B, PA-C  dicyclomine (BENTYL) 20 MG tablet Take 1 tablet (20 mg total) by mouth 4 (four) times daily -  before meals and at bedtime. Patient not taking: Reported on 01/30/2018 08/13/17 08/23/17  Aviva Kluver B, PA-C  doxycycline (VIBRAMYCIN) 100 MG capsule Take 1 capsule (100 mg total) by mouth 2 (two) times daily for 14 days. Patient not taking: Reported on 01/30/2018 01/29/18 02/12/18  Caccavale, Sophia, PA-C  fluticasone (FLONASE) 50 MCG/ACT nasal spray Place 2 sprays into both nostrils daily. Patient not taking: Reported on 01/30/2018 09/03/17   Belinda Fisher, PA-C  gabapentin (NEURONTIN) 300 MG capsule Take 1 capsule (300 mg total) by mouth 3 (three)  times daily. Patient not taking: Reported on 01/30/2018 05/30/17   Brock Bad, MD  HYDROcodone-acetaminophen (NORCO/VICODIN) 5-325 MG tablet Take 1-2 tablets by mouth every 6 (six) hours as needed for severe pain. Patient not taking: Reported on 01/30/2018 07/31/17   Joy, Hillard Danker, PA-C  ibuprofen (ADVIL,MOTRIN) 600 MG tablet Take 1 tablet (600 mg total) by mouth every 6 (six) hours as needed. Patient not taking: Reported on 01/30/2018 07/31/17   Joy, Hillard Danker, PA-C  methocarbamol (ROBAXIN) 500 MG tablet Take 1 tablet (500 mg total) by mouth at bedtime as needed for muscle spasms. Patient not taking: Reported on 01/30/2018 08/11/17   Mathews Robinsons B, PA-C  metroNIDAZOLE (METROGEL VAGINAL) 0.75 % vaginal gel Place 1 Applicatorful vaginally 2 (two) times daily. Patient not taking: Reported on 01/30/2018 05/30/17   Brock Bad, MD  ondansetron (ZOFRAN) 4 MG tablet Take 1 tablet (4 mg total) by mouth every 8 (eight) hours as needed for nausea or vomiting. Patient not taking: Reported on 01/30/2018 01/29/18   Caccavale, Sophia, PA-C  ondansetron (ZOFRAN-ODT) 4 MG disintegrating tablet Take 1 tablet (4 mg total) by mouth every 8 (eight) hours as needed for nausea or vomiting. Patient not taking: Reported on 01/30/2018 10/16/17   Benjiman Core, MD  pantoprazole (PROTONIX) 40 MG tablet Take 1 tablet (40 mg total) daily by mouth. Patient not taking: Reported on 01/30/2018 09/20/17   Pricilla Loveless, MD  promethazine (PHENERGAN) 25 MG tablet Take 1 tablet (25 mg total) by mouth every 6 (six) hours as needed for nausea or vomiting. Patient not taking: Reported on 01/30/2018 05/27/17   Vanetta Mulders, MD  sertraline (ZOLOFT) 100 MG tablet Take 1 tablet (100 mg total) daily by mouth. Patient not taking: Reported on 01/30/2018 09/20/17   Pricilla Loveless, MD    Family History Family History  Problem Relation Age of Onset  . Heart disease Mother   . Cancer Maternal Grandmother        breast  . Cancer  Paternal Grandmother        breast    Social History Social History   Tobacco Use  . Smoking status: Former Smoker    Years: 3.00    Types: Cigars    Last attempt to quit: 04/28/2017    Years since quitting: 0.7  . Smokeless tobacco: Never Used  . Tobacco comment: 1-2 cig./day  Substance Use Topics  . Alcohol use: No    Alcohol/week: 0.0 oz    Comment: occ  . Drug use: No     Allergies   Latuda [lurasidone hcl]; Sulfa antibiotics; Bee venom; Flagyl [metronidazole]; Naproxen; Tramadol; and Toradol [ketorolac tromethamine]   Review of Systems Review of Systems  All other systems reviewed and are negative.    Physical Exam Updated Vital Signs BP 114/75 (BP  Location: Left Arm)   Pulse 78   Temp 97.7 F (36.5 C) (Oral)   Resp (!) 24   SpO2 100%   Physical Exam  Constitutional: She is oriented to person, place, and time. She appears well-developed and well-nourished.  Non-toxic appearance. No distress.  HENT:  Head: Normocephalic and atraumatic.  Eyes: Conjunctivae, EOM and lids are normal. Pupils are equal, round, and reactive to light.  Neck: Normal range of motion. Neck supple. No tracheal deviation present. No thyroid mass present.  Cardiovascular: Normal rate, regular rhythm and normal heart sounds. Exam reveals no gallop.  No murmur heard. Pulmonary/Chest: Effort normal and breath sounds normal. No stridor. No respiratory distress. She has no decreased breath sounds. She has no wheezes. She has no rhonchi. She has no rales.  Abdominal: Soft. Normal appearance and bowel sounds are normal. She exhibits no distension. There is no tenderness. There is no rebound and no CVA tenderness.  Musculoskeletal: Normal range of motion. She exhibits no edema or tenderness.  Neurological: She is alert and oriented to person, place, and time. She has normal strength. No cranial nerve deficit or sensory deficit. GCS eye subscore is 4. GCS verbal subscore is 5. GCS motor subscore is  6.  Skin: Skin is warm and dry. No abrasion and no rash noted.  Psychiatric: She has a normal mood and affect. Her speech is normal and behavior is normal.  Nursing note and vitals reviewed.    ED Treatments / Results  Labs (all labs ordered are listed, but only abnormal results are displayed) Labs Reviewed  URINE CULTURE  URINALYSIS, ROUTINE W REFLEX MICROSCOPIC    EKG  EKG Interpretation None       Radiology No results found.  Procedures Procedures (including critical care time)  Medications Ordered in ED Medications - No data to display   Initial Impression / Assessment and Plan / ED Course  I have reviewed the triage vital signs and the nursing notes.  Pertinent labs & imaging results that were available during my care of the patient were reviewed by me and considered in my medical decision making (see chart for details).     Urinalysis negative here.  Patient stable for discharge  Final Clinical Impressions(s) / ED Diagnoses   Final diagnoses:  None    ED Discharge Orders    None       Lorre Nick, MD 01/30/18 1408

## 2018-01-30 NOTE — ED Triage Notes (Signed)
Pt was seen here last night and was told has PID and given antibiotic shot. Reports that since she has been discharged and sitting in lobby waiting for her mother to come pick her up, started having abd pains and dysuria that she didn't have before. Patient reports nausea but no v/d.

## 2018-02-01 LAB — URINE CULTURE: Culture: <10000 — AB

## 2018-02-04 NOTE — ED Provider Notes (Signed)
[facility]   [id number] 01/30/18 Arrival Time: 1521  ASSESSMENT & PLAN:  1. Medication refill   2. Other chronic pain     Meds ordered this encounter  Medications  . ALPRAZolam (XANAX) 1 MG tablet    Sig: Take 1 tablet (1 mg total) by mouth at bedtime as needed for anxiety.    Dispense:  30 tablet    Refill:  0  . oxyCODONE-acetaminophen (PERCOCET) 7.5-325 MG tablet    Sig: Take 1 tablet by mouth 3 (three) times daily as needed (pain).    Dispense:  90 tablet    Refill:  0   Discussed will refill this one time. She will need to find a new PCP for further refills.  Reviewed expectations re: course of current medical issues. Questions answered. Outlined signs and symptoms indicating need for more acute intervention. Patient verbalized understanding. After Visit Summary given.   SUBJECTIVE: History from: patient. Leah Barry is a 32 y.o. female who presents requesting medication refill. No current concerns. Chronic pain. PCPs office closed abruptly. No withdrawal symptoms.  Current medical problems include:  Past Medical History:  Diagnosis Date  . Anxiety   . Apnea, sleep    no CPAP use, states lost machine during move to [place]  . Bipolar 1 disorder (HCC)   . Bipolar disorder (HCC)    no current med.  . Depression   . History of MRSA infection 2010  . Irritable bowel syndrome (IBS)    no current med.  . Pelvic inflammatory disease   . Scalp laceration    staples to be removed 03/13/2014  . Ulnar fracture 03/09/2014   left    ROS: As per HPI.   OBJECTIVE:  Vitals:   01/30/18 1657  BP: 118/70  Pulse: 77  Resp: 18  Temp: (!) 97 F (36.1 C)  TempSrc: Oral  SpO2: 100%    General appearance: alert; no distress Eyes: PERRLA; EOMI; conjunctiva normal Neck: supple  Lungs: clear to auscultation bilaterally Heart: regular rate and rhythm Abdomen: soft, non-tender Extremities: no cyanosis or edema; symmetrical with no gross deformities Skin:  warm and dry Neurologic: normal gait; normal symmetric reflexes Psychological: alert and cooperative; normal mood and affect   Allergies  Allergen Reactions  . Latuda [Lurasidone Hcl] Palpitations and Other (See Comments)    Hot flashes, tremors  . Sulfa Antibiotics Swelling    SWELLING OF EYES  . Bee Venom Swelling    "Bees make my eyes swell."  . Flagyl [Metronidazole] Nausea And Vomiting    tablets causes reaction Topical is ok  . Naproxen Other (See Comments)    NOSE BLEED  . Tramadol Itching and Nausea And Vomiting  . Toradol [Ketorolac Tromethamine] Itching    Able to take Ibuprofen    Past Medical History:  Diagnosis Date  . Anxiety   . Apnea, sleep    no CPAP use, states lost machine during move to [place]  . Bipolar 1 disorder (HCC)   . Bipolar disorder (HCC)    no current med.  . Depression   . History of MRSA infection 2010  . Irritable bowel syndrome (IBS)    no current med.  . Pelvic inflammatory disease   . Scalp laceration    staples to be removed 03/13/2014  . Ulnar fracture 03/09/2014   left   Social History   Socioeconomic History  . Marital status: Single    Spouse name: Not on file  . Number of children: Not  on file  . Years of education: Not on file  . Highest education level: Not on file  Occupational History  . Not on file  Social Needs  . Financial resource strain: Not on file  . Food insecurity:    Worry: Not on file    Inability: Not on file  . Transportation needs:    Medical: Not on file    Non-medical: Not on file  Tobacco Use  . Smoking status: Former Smoker    Years: 3.00    Types: Cigars    Last attempt to quit: 04/28/2017    Years since quitting: 0.7  . Smokeless tobacco: Never Used  . Tobacco comment: 1-2 cig./day  Substance and Sexual Activity  . Alcohol use: No    Alcohol/week: 0.0 oz    Comment: occ  . Drug use: No  . Sexual activity: Yes    Partners: Male  Lifestyle  . Physical activity:    Days per week: Not on  file    Minutes per session: Not on file  . Stress: Not on file  Relationships  . Social connections:    Talks on phone: Not on file    Gets together: Not on file    Attends religious service: Not on file    Active member of club or organization: Not on file    Attends meetings of clubs or organizations: Not on file    Relationship status: Not on file  . Intimate partner violence:    Fear of current or ex partner: Not on file    Emotionally abused: Not on file    Physically abused: Not on file    Forced sexual activity: Not on file  Other Topics Concern  . Not on file  Social History Narrative  . Not on file   Family History  Problem Relation Age of Onset  . Heart disease Mother   . Cancer Maternal Grandmother        breast  . Cancer Paternal Grandmother        breast   Past Surgical History:  Procedure Laterality Date  . CYST EXCISION     middle of back  . ORIF ULNAR FRACTURE Left 03/16/2014   Procedure: OPEN REDUCTION INTERNAL FIXATION (ORIF) LEFT ULNAR FRACTURE;  Surgeon: Marlowe ShoresMatthew A Weingold, MD;  Location: [facility];  Service: Orthopedics;  Laterality: Left;  left  . OVARIAN CYST REMOVAL    . THYROID CYST EXCISION     nodule exc.  Karie Kirks. TUBAL LIGATION       Mardella LaymanHagler, Epifania Littrell, MD 02/04/18 [phone]

## 2018-02-25 ENCOUNTER — Telehealth (HOSPITAL_COMMUNITY): Payer: Self-pay

## 2018-02-25 NOTE — Telephone Encounter (Signed)
Pt called this RN earlier in the day regarding her prescription given to her for Xanax not being enough, instructed I would talk to a provider here and call her back. Pt did not answer when attempting to reach her.

## 2018-04-10 ENCOUNTER — Other Ambulatory Visit: Payer: Self-pay

## 2018-04-10 ENCOUNTER — Emergency Department (HOSPITAL_COMMUNITY)
Admission: EM | Admit: 2018-04-10 | Discharge: 2018-04-10 | Disposition: A | Discharge status: Home / Self Care | Payer: Medicaid Other | Attending: Emergency Medicine | Admitting: Emergency Medicine

## 2018-04-10 ENCOUNTER — Encounter (HOSPITAL_COMMUNITY): Payer: Self-pay | Admitting: Emergency Medicine

## 2018-04-10 DIAGNOSIS — N939 Abnormal uterine and vaginal bleeding, unspecified: Secondary | ICD-10-CM | POA: Insufficient documentation

## 2018-04-10 DIAGNOSIS — Z79899 Other long term (current) drug therapy: Secondary | ICD-10-CM | POA: Insufficient documentation

## 2018-04-10 DIAGNOSIS — R1030 Lower abdominal pain, unspecified: Secondary | ICD-10-CM | POA: Insufficient documentation

## 2018-04-10 DIAGNOSIS — F17290 Nicotine dependence, other tobacco product, uncomplicated: Secondary | ICD-10-CM | POA: Diagnosis not present

## 2018-04-10 LAB — CBC WITH DIFFERENTIAL/PLATELET
Basophils Absolute: 0 10*3/uL (ref 0.0–0.1)
Basophils Relative: 0 %
Eosinophils Absolute: 0.9 10*3/uL — ABNORMAL HIGH (ref 0.0–0.7)
Eosinophils Relative: 9 %
HCT: 39.7 % (ref 36.0–46.0)
Hemoglobin: 13.9 g/dL (ref 12.0–15.0)
Lymphocytes Relative: 31 %
Lymphs Abs: 3.2 10*3/uL (ref 0.7–4.0)
MCH: 27.9 pg (ref 26.0–34.0)
MCHC: 35 g/dL (ref 30.0–36.0)
MCV: 79.6 fL (ref 78.0–100.0)
Monocytes Absolute: 0.8 10*3/uL (ref 0.1–1.0)
Monocytes Relative: 8 %
Neutro Abs: 5.4 10*3/uL (ref 1.7–7.7)
Neutrophils Relative %: 52 %
Platelets: 282 10*3/uL (ref 150–400)
RBC: 4.99 MIL/uL (ref 3.87–5.11)
RDW: 14.1 % (ref 11.5–15.5)
WBC: 10.3 10*3/uL (ref 4.0–10.5)

## 2018-04-10 LAB — URINALYSIS, ROUTINE W REFLEX MICROSCOPIC
Bilirubin Urine: NEGATIVE
Glucose, UA: NEGATIVE mg/dL
Ketones, ur: 5 mg/dL — AB
Leukocytes, UA: NEGATIVE
Nitrite: NEGATIVE
Protein, ur: NEGATIVE mg/dL
Specific Gravity, Urine: 1.031 — ABNORMAL HIGH (ref 1.005–1.030)
pH: 5 (ref 5.0–8.0)

## 2018-04-10 LAB — I-STAT CHEM 8, ED
BUN: 8 mg/dL (ref 6–20)
Calcium, Ion: 1.17 mmol/L (ref 1.15–1.40)
Chloride: 103 mmol/L (ref 101–111)
Creatinine, Ser: 0.6 mg/dL (ref 0.44–1.00)
Glucose, Bld: 102 mg/dL — ABNORMAL HIGH (ref 65–99)
HCT: 41 % (ref 36.0–46.0)
Hemoglobin: 13.9 g/dL (ref 12.0–15.0)
Potassium: 3.3 mmol/L — ABNORMAL LOW (ref 3.5–5.1)
Sodium: 141 mmol/L (ref 135–145)
TCO2: 28 mmol/L (ref 22–32)

## 2018-04-10 LAB — I-STAT BETA HCG BLOOD, ED (MC, WL, AP ONLY): I-stat hCG, quantitative: <5 m[IU]/mL (ref <5)

## 2018-04-10 MED ORDER — SODIUM CHLORIDE 0.9 % IV BOLUS
1000.0000 mL | Freq: Once | INTRAVENOUS | Status: AC
  Administered 2018-04-10: 1000 mL via INTRAVENOUS

## 2018-04-10 MED ORDER — CEFTRIAXONE SODIUM 250 MG IJ SOLR
250.0000 mg | Freq: Once | INTRAMUSCULAR | Status: AC
Start: 2018-04-10 — End: 2018-04-10
  Administered 2018-04-10: 250 mg via INTRAMUSCULAR
  Filled 2018-04-10: qty 250

## 2018-04-10 MED ORDER — AZITHROMYCIN 1 G PO PACK
1.0000 g | PACK | Freq: Once | ORAL | Status: AC
  Administered 2018-04-10: 1 g via ORAL
  Filled 2018-04-10: qty 1

## 2018-04-10 MED ORDER — LIDOCAINE HCL 1 % IJ SOLN
INTRAMUSCULAR | Status: AC
  Administered 2018-04-10: 20 mL
  Filled 2018-04-10: qty 20

## 2018-04-10 MED ORDER — ONDANSETRON HCL 4 MG/2ML IJ SOLN
4.0000 mg | Freq: Once | INTRAMUSCULAR | Status: AC
  Administered 2018-04-10: 4 mg via INTRAVENOUS
  Filled 2018-04-10: qty 2

## 2018-04-10 NOTE — ED Triage Notes (Signed)
Pt brought in by [facility] with c/o vaginal bleeding  Pt states she had a tubal ligation a couple years ago but states she has been pregnant since  Pt states she had a regular period last week with blood clots and is bleeding again with abd pain and left lower back pain and says she has the urge to push

## 2018-04-10 NOTE — ED Provider Notes (Signed)
[facility]-EMERGENCY DEPT Provider Note   CSN: [id number] Arrival date & time: 04/10/18  4098     History   Chief Complaint Chief Complaint  Patient presents with  . Vaginal Bleeding    HPI Leah Barry is a 32 y.o. female.  Patient was being taken to jail and started to have some abdominal cramps and vaginal bleeding.  She states her periods have been very irregular and she was concerned about a sexually transmitted disease.  The history is provided by the patient. No language interpreter was used.  Vaginal Bleeding  Primary symptoms include discharge, vaginal bleeding. There has been no fever. This is a new problem. The current episode started 2 days ago. The problem occurs constantly. The problem has not changed since onset.The symptoms occur at rest. She is not pregnant. She has missed her period. Pertinent negatives include no abdominal pain, no diarrhea and no frequency.    Past Medical History:  Diagnosis Date  . Anxiety   . Apnea, sleep    no CPAP use, states lost machine during move to [place]  . Bipolar 1 disorder (HCC)   . Bipolar disorder (HCC)    no current med.  . Depression   . History of MRSA infection 2010  . Irritable bowel syndrome (IBS)    no current med.  . Pelvic inflammatory disease   . Scalp laceration    staples to be removed 03/13/2014  . Ulnar fracture 03/09/2014   left    Patient Active Problem List   Diagnosis Date Noted  . Dysmenorrhea 05/28/2014  . GC (gonococcus) 05/28/2014  . Chlamydia contact 05/28/2014  . Pain 04/14/2014  . UTI symptoms 04/14/2014  . Routine general medical examination at a health care facility 09/26/2013  . Candidiasis of vulva and vagina 09/26/2013  . Unspecified symptom associated with female genital organs 09/10/2013  . Abnormal uterine bleeding (AUB) 09/10/2013  . PID (pelvic inflammatory disease) 07/29/2013  . Dysuria 07/29/2013  . Screening examination for venereal disease 07/29/2013   . Chronic PID (chronic pelvic inflammatory disease) 06/30/2013  . Excessive or frequent menstruation 06/30/2013  . Genital herpes, unspecified 06/30/2013  . Unspecified inflammatory disease of female pelvic organs and tissues 03/12/2013    Past Surgical History:  Procedure Laterality Date  . CYST EXCISION     middle of back  . ORIF ULNAR FRACTURE Left 03/16/2014   Procedure: OPEN REDUCTION INTERNAL FIXATION (ORIF) LEFT ULNAR FRACTURE;  Surgeon: Marlowe Shores, MD;  Location: [facility];  Service: Orthopedics;  Laterality: Left;  left  . OVARIAN CYST REMOVAL    . THYROID CYST EXCISION     nodule exc.  . TUBAL LIGATION       OB History    Gravida  5   Para  4   Term  4   Preterm      AB  1   Living  4     SAB      TAB      Ectopic  1   Multiple      Live Births  4            Home Medications    Prior to Admission medications   Medication Sig Start Date End Date Taking? Authorizing Provider  ARIPiprazole (ABILIFY) 5 MG tablet Take 5 mg by mouth 3 (three) times daily.   Yes [provider]  clonazePAM (KLONOPIN) 0.5 MG tablet Take 0.25-0.5 mg by mouth 3 (three) times  daily as needed for anxiety.  03/27/18  Yes [provider]  oxyCODONE-acetaminophen (PERCOCET) 7.5-325 MG tablet Take 1 tablet by mouth 3 (three) times daily as needed (pain). Patient taking differently: Take 1 tablet by mouth 3 (three) times daily as needed for moderate pain.  01/30/18  Yes Hagler, Arlys John, MD  zolpidem (AMBIEN) 10 MG tablet Take 10 mg by mouth at bedtime. 03/14/18  Yes [provider]  ALPRAZolam Prudy Feeler) 1 MG tablet Take 1 tablet (1 mg total) by mouth at bedtime as needed for anxiety. Patient not taking: Reported on 04/10/2018 01/30/18   Mardella Layman, MD  cetirizine-pseudoephedrine (ZYRTEC-D) 5-120 MG tablet Take 1 tablet by mouth daily. Patient not taking: Reported on 01/30/2018 09/03/17   Belinda Fisher, PA-C  clindamycin (CLEOCIN) 2 % vaginal  cream Place 1 Applicatorful vaginally at bedtime. Patient not taking: Reported on 01/30/2018 08/13/17   Aviva Kluver B, PA-C  dicyclomine (BENTYL) 20 MG tablet Take 1 tablet (20 mg total) by mouth 4 (four) times daily -  before meals and at bedtime. Patient not taking: Reported on 01/30/2018 08/13/17 08/23/17  Aviva Kluver B, PA-C  fluticasone (FLONASE) 50 MCG/ACT nasal spray Place 2 sprays into both nostrils daily. Patient not taking: Reported on 01/30/2018 09/03/17   Belinda Fisher, PA-C  gabapentin (NEURONTIN) 300 MG capsule Take 1 capsule (300 mg total) by mouth 3 (three) times daily. Patient not taking: Reported on 01/30/2018 05/30/17   Brock Bad, MD  HYDROcodone-acetaminophen (NORCO/VICODIN) 5-325 MG tablet Take 1-2 tablets by mouth every 6 (six) hours as needed for severe pain. Patient not taking: Reported on 01/30/2018 07/31/17   Joy, Hillard Danker, PA-C  ibuprofen (ADVIL,MOTRIN) 600 MG tablet Take 1 tablet (600 mg total) by mouth every 6 (six) hours as needed. Patient not taking: Reported on 01/30/2018 07/31/17   Anselm Pancoast, PA-C  ibuprofen (ADVIL,MOTRIN) 800 MG tablet Take 1 tablet (800 mg total) by mouth every 8 (eight) hours as needed. Patient not taking: Reported on 04/10/2018 08/22/17   Brock Bad, MD  methocarbamol (ROBAXIN) 500 MG tablet Take 1 tablet (500 mg total) by mouth at bedtime as needed for muscle spasms. Patient not taking: Reported on 01/30/2018 08/11/17   Mathews Robinsons B, PA-C  metroNIDAZOLE (METROGEL VAGINAL) 0.75 % vaginal gel Place 1 Applicatorful vaginally 2 (two) times daily. Patient not taking: Reported on 01/30/2018 05/30/17   Brock Bad, MD  ondansetron (ZOFRAN) 4 MG tablet Take 1 tablet (4 mg total) by mouth every 8 (eight) hours as needed for nausea or vomiting. Patient not taking: Reported on 01/30/2018 01/29/18   Caccavale, Sophia, PA-C  ondansetron (ZOFRAN-ODT) 4 MG disintegrating tablet Take 1 tablet (4 mg total) by mouth every 8 (eight) hours as  needed for nausea or vomiting. Patient not taking: Reported on 01/30/2018 10/16/17   Benjiman Core, MD  pantoprazole (PROTONIX) 40 MG tablet Take 1 tablet (40 mg total) daily by mouth. Patient not taking: Reported on 01/30/2018 09/20/17   Pricilla Loveless, MD  promethazine (PHENERGAN) 25 MG tablet Take 1 tablet (25 mg total) by mouth every 6 (six) hours as needed for nausea or vomiting. Patient not taking: Reported on 01/30/2018 05/27/17   Vanetta Mulders, MD  sertraline (ZOLOFT) 100 MG tablet Take 1 tablet (100 mg total) daily by mouth. Patient not taking: Reported on 01/30/2018 09/20/17   Pricilla Loveless, MD    Family History Family History  Problem Relation Age of Onset  . Heart disease Mother   .  Cancer Maternal Grandmother        breast  . Cancer Paternal Grandmother        breast    Social History Social History   Tobacco Use  . Smoking status: Current Some Day Smoker    Years: 3.00    Types: Cigars    Last attempt to quit: 04/28/2017    Years since quitting: 0.9  . Smokeless tobacco: Never Used  . Tobacco comment: 1-2 cig./day  Substance Use Topics  . Alcohol use: Yes    Alcohol/week: 0.0 oz    Comment: occ  . Drug use: Yes    Types: Marijuana     Allergies   Latuda [lurasidone hcl]; Sulfa antibiotics; Bee venom; Flagyl [metronidazole]; Naproxen; Tramadol; and Toradol [ketorolac tromethamine]   Review of Systems Review of Systems  Constitutional: Negative for appetite change and fatigue.  HENT: Negative for congestion, ear discharge and sinus pressure.   Eyes: Negative for discharge.  Respiratory: Negative for cough.   Cardiovascular: Negative for chest pain.  Gastrointestinal: Negative for abdominal pain and diarrhea.  Genitourinary: Positive for vaginal bleeding. Negative for frequency and hematuria.  Musculoskeletal: Negative for back pain.  Skin: Negative for rash.  Neurological: Negative for seizures and headaches.  Psychiatric/Behavioral: Negative for  hallucinations.     Physical Exam Updated Vital Signs BP (!) 119/91 (BP Location: Right Arm)   Pulse 85   Temp 97.9 F (36.6 C) (Oral)   Resp 15   Ht  (1.702 m)   Wt 73.5 kg (162 lb)   SpO2 100%   BMI 25.37 kg/m   Physical Exam  Constitutional: She is oriented to person, place, and time. She appears well-developed.  HENT:  Head: Normocephalic.  Eyes: Conjunctivae and EOM are normal. No scleral icterus.  Neck: Neck supple. No thyromegaly present.  Cardiovascular: Normal rate and regular rhythm. Exam reveals no gallop and no friction rub.  No murmur heard. Pulmonary/Chest: No stridor. She has no wheezes. She has no rales. She exhibits no tenderness.  Abdominal: She exhibits no distension. There is no tenderness. There is no rebound.  Genitourinary:  Genitourinary Comments: Pelvic exam shows tender cervix by  bimanual exam.  Bloody discharge  Musculoskeletal: Normal range of motion. She exhibits no edema.  Lymphadenopathy:    She has no cervical adenopathy.  Neurological: She is oriented to person, place, and time. She exhibits normal muscle tone. Coordination normal.  Skin: No rash noted. No erythema.  Psychiatric: She has a normal mood and affect. Her behavior is normal.     ED Treatments / Results  Labs (all labs ordered are listed, but only abnormal results are displayed) Labs Reviewed  CBC WITH DIFFERENTIAL/PLATELET - Abnormal; Notable for the following components:      Result Value   Eosinophils Absolute 0.9 (*)    All other components within normal limits  URINALYSIS, ROUTINE W REFLEX MICROSCOPIC - Abnormal; Notable for the following components:   Color, Urine AMBER (*)    Specific Gravity, Urine 1.031 (*)    Hgb urine dipstick SMALL (*)    Ketones, ur 5 (*)    Bacteria, UA RARE (*)    All other components within normal limits  I-STAT CHEM 8, ED - Abnormal; Notable for the following components:   Potassium 3.3 (*)    Glucose, Bld 102 (*)    All other  components within normal limits  I-STAT BETA HCG BLOOD, ED ([facility], [facility], [facility] ONLY)  GC/CHLAMYDIA PROBE AMP ([place]) NOT  AT [facility]    EKG None  Radiology No results found.  Procedures Procedures (including critical care time)  Medications Ordered in ED Medications  sodium chloride 0.9 % bolus 1,000 mL (0 mLs Intravenous Stopped 04/10/18 1021)  ondansetron (ZOFRAN) injection 4 mg (4 mg Intravenous Given 04/10/18 0901)  cefTRIAXone (ROCEPHIN) injection 250 mg (250 mg Intramuscular Given 04/10/18 1135)  azithromycin (ZITHROMAX) powder 1 g (1 g Oral Given 04/10/18 1136)  lidocaine (XYLOCAINE) 1 % (with pres) injection (20 mLs  Given 04/10/18 1136)     Initial Impression / Assessment and Plan / ED Course  I have reviewed the triage vital signs and the nursing notes.  Pertinent labs & imaging results that were available during my care of the patient were reviewed by me and considered in my medical decision making (see chart for details).    CBC chemistries urinalysis on room markable.  Patient is not pregnant.  We will empirically treat her for STD.  Chlamydia and GC has been sent to lab.  She is discharged home to follow-up at [facility].  She will be taken to jail by the police officer  Final Clinical Impressions(s) / ED Diagnoses   Final diagnoses:  Vaginal bleeding    ED Discharge Orders    None       Bethann Berkshire, MD 04/10/18 1244

## 2018-04-10 NOTE — Discharge Instructions (Signed)
Follow-up in the next couple weeks with [facility]

## 2018-04-11 LAB — GC/CHLAMYDIA PROBE AMP (~~LOC~~) NOT AT ARMC
Chlamydia: NEGATIVE
Neisseria Gonorrhea: POSITIVE — AB

## 2018-05-30 ENCOUNTER — Emergency Department (HOSPITAL_BASED_OUTPATIENT_CLINIC_OR_DEPARTMENT_OTHER): Payer: Medicaid Other

## 2018-05-30 ENCOUNTER — Other Ambulatory Visit: Payer: Self-pay

## 2018-05-30 ENCOUNTER — Emergency Department (HOSPITAL_BASED_OUTPATIENT_CLINIC_OR_DEPARTMENT_OTHER)
Admission: EM | Admit: 2018-05-30 | Discharge: 2018-05-30 | Disposition: A | Discharge status: Home / Self Care | Payer: Medicaid Other | Attending: Emergency Medicine | Admitting: Emergency Medicine

## 2018-05-30 ENCOUNTER — Encounter (HOSPITAL_BASED_OUTPATIENT_CLINIC_OR_DEPARTMENT_OTHER): Payer: Self-pay

## 2018-05-30 DIAGNOSIS — Z79899 Other long term (current) drug therapy: Secondary | ICD-10-CM | POA: Insufficient documentation

## 2018-05-30 DIAGNOSIS — F17290 Nicotine dependence, other tobacco product, uncomplicated: Secondary | ICD-10-CM | POA: Insufficient documentation

## 2018-05-30 DIAGNOSIS — R1013 Epigastric pain: Secondary | ICD-10-CM | POA: Diagnosis present

## 2018-05-30 DIAGNOSIS — R109 Unspecified abdominal pain: Secondary | ICD-10-CM

## 2018-05-30 LAB — URINALYSIS, ROUTINE W REFLEX MICROSCOPIC
Bilirubin Urine: NEGATIVE
Glucose, UA: NEGATIVE mg/dL
Hgb urine dipstick: NEGATIVE
Ketones, ur: NEGATIVE mg/dL
Leukocytes, UA: NEGATIVE
Nitrite: NEGATIVE
Protein, ur: NEGATIVE mg/dL
Specific Gravity, Urine: 1.01 (ref 1.005–1.030)
pH: 7.5 (ref 5.0–8.0)

## 2018-05-30 LAB — COMPREHENSIVE METABOLIC PANEL
ALT: 65 U/L — ABNORMAL HIGH (ref 0–44)
AST: 46 U/L — ABNORMAL HIGH (ref 15–41)
Albumin: 3.8 g/dL (ref 3.5–5.0)
Alkaline Phosphatase: 81 U/L (ref 38–126)
Anion gap: 7 (ref 5–15)
BUN: 7 mg/dL (ref 6–20)
CO2: 27 mmol/L (ref 22–32)
Calcium: 8.4 mg/dL — ABNORMAL LOW (ref 8.9–10.3)
Chloride: 105 mmol/L (ref 98–111)
Creatinine, Ser: 0.49 mg/dL (ref 0.44–1.00)
GFR calc Af Amer: >60 mL/min (ref >60)
GFR calc non Af Amer: >60 mL/min (ref >60)
Glucose, Bld: 96 mg/dL (ref 70–99)
Potassium: 3.7 mmol/L (ref 3.5–5.1)
Sodium: 139 mmol/L (ref 135–145)
Total Bilirubin: 0.2 mg/dL — ABNORMAL LOW (ref 0.3–1.2)
Total Protein: 6.6 g/dL (ref 6.5–8.1)

## 2018-05-30 LAB — CBC WITH DIFFERENTIAL/PLATELET
Basophils Absolute: 0 10*3/uL (ref 0.0–0.1)
Basophils Relative: 0 %
Eosinophils Absolute: 0.5 10*3/uL (ref 0.0–0.7)
Eosinophils Relative: 6 %
HCT: 35.7 % — ABNORMAL LOW (ref 36.0–46.0)
Hemoglobin: 12.8 g/dL (ref 12.0–15.0)
Lymphocytes Relative: 27 %
Lymphs Abs: 2.3 10*3/uL (ref 0.7–4.0)
MCH: 27.2 pg (ref 26.0–34.0)
MCHC: 35.9 g/dL (ref 30.0–36.0)
MCV: 76 fL — ABNORMAL LOW (ref 78.0–100.0)
Monocytes Absolute: 0.8 10*3/uL (ref 0.1–1.0)
Monocytes Relative: 9 %
Neutro Abs: 5 10*3/uL (ref 1.7–7.7)
Neutrophils Relative %: 58 %
Platelets: 242 10*3/uL (ref 150–400)
RBC: 4.7 MIL/uL (ref 3.87–5.11)
RDW: 12.8 % (ref 11.5–15.5)
WBC: 8.6 10*3/uL (ref 4.0–10.5)

## 2018-05-30 LAB — LIPASE, BLOOD: Lipase: 57 U/L — ABNORMAL HIGH (ref 11–51)

## 2018-05-30 LAB — PREGNANCY, URINE: Preg Test, Ur: NEGATIVE

## 2018-05-30 MED ORDER — HYDROXYZINE HCL 25 MG PO TABS: 25.0000 mg | ORAL_TABLET | Freq: Two times a day (BID) | ORAL | 0 refills | Status: DC | PRN

## 2018-05-30 MED ORDER — IBUPROFEN 400 MG PO TABS
600.0000 mg | ORAL_TABLET | Freq: Once | ORAL | Status: AC
  Administered 2018-05-30: 600 mg via ORAL
  Filled 2018-05-30: qty 1

## 2018-05-30 MED ORDER — SODIUM CHLORIDE 0.9 % IV BOLUS
1000.0000 mL | Freq: Once | INTRAVENOUS | Status: AC
  Administered 2018-05-30: 1000 mL via INTRAVENOUS

## 2018-05-30 MED ORDER — HYDROCODONE-ACETAMINOPHEN 5-325 MG PO TABS: 2.0000 | ORAL_TABLET | Freq: Once | ORAL | Status: DC

## 2018-05-30 MED ORDER — ARIPIPRAZOLE 5 MG PO TABS: 5.0000 mg | ORAL_TABLET | Freq: Three times a day (TID) | ORAL | 0 refills | Status: DC

## 2018-05-30 MED ORDER — MORPHINE SULFATE (PF) 4 MG/ML IV SOLN: 4.0000 mg | Freq: Once | INTRAVENOUS | Status: DC

## 2018-05-30 NOTE — Discharge Instructions (Addendum)
Continue medications as previously prescribed.  Ibuprofen X 100 mg every 6 hours as needed for pain.

## 2018-05-30 NOTE — ED Triage Notes (Signed)
Pt c/o left CP, abd pain since 11am-stating pain started after getting out of jail today-pt also requesting 30 day supply of her meds [facility]-NAD-steady gait

## 2018-05-30 NOTE — ED Provider Notes (Addendum)
[facility] EMERGENCY DEPARTMENT Provider Note   CSN: [id number] Arrival date & time: 05/30/18  1636     History   Chief Complaint Chief Complaint  Patient presents with  . Chest Pain    HPI Leah Barry is a 32 y.o. female.  Patient is a 32 year old female with past medical history of bipolar, depression, and anxiety.  She was recently incarcerated for 56 days and released this afternoon.  She states while she was incarcerated, she was experiencing abdominal discomfort.  She reports pain to her upper abdomen and left side of her chest.  This is worse when she eats.  There is no shortness of breath, diaphoresis, or radiation.  She denies exertional symptoms.  The history is provided by the patient.  Chest Pain   This is a new problem. Episode onset: 1 week ago. The problem occurs constantly. The problem has been gradually worsening. The pain is mild. The pain radiates to the epigastrium. Associated symptoms include abdominal pain. Pertinent negatives include no cough, no diaphoresis, no nausea, no shortness of breath and no sputum production. She has tried nothing for the symptoms.    Past Medical History:  Diagnosis Date  . Anxiety   . Apnea, sleep    no CPAP use, states lost machine during move to [place]  . Bipolar 1 disorder (HCC)   . Bipolar disorder (HCC)    no current med.  . Depression   . History of MRSA infection 2010  . Irritable bowel syndrome (IBS)    no current med.  . Pelvic inflammatory disease   . Scalp laceration    staples to be removed 03/13/2014  . Ulnar fracture 03/09/2014   left    Patient Active Problem List   Diagnosis Date Noted  . Dysmenorrhea 05/28/2014  . GC (gonococcus) 05/28/2014  . Chlamydia contact 05/28/2014  . Pain 04/14/2014  . UTI symptoms 04/14/2014  . Routine general medical examination at a health care facility 09/26/2013  . Candidiasis of vulva and vagina 09/26/2013  . Unspecified symptom associated with female  genital organs 09/10/2013  . Abnormal uterine bleeding (AUB) 09/10/2013  . PID (pelvic inflammatory disease) 07/29/2013  . Dysuria 07/29/2013  . Screening examination for venereal disease 07/29/2013  . Chronic PID (chronic pelvic inflammatory disease) 06/30/2013  . Excessive or frequent menstruation 06/30/2013  . Genital herpes, unspecified 06/30/2013  . Unspecified inflammatory disease of female pelvic organs and tissues 03/12/2013    Past Surgical History:  Procedure Laterality Date  . CYST EXCISION     middle of back  . ORIF ULNAR FRACTURE Left 03/16/2014   Procedure: OPEN REDUCTION INTERNAL FIXATION (ORIF) LEFT ULNAR FRACTURE;  Surgeon: Marlowe Shores, MD;  Location: [facility];  Service: Orthopedics;  Laterality: Left;  left  . OVARIAN CYST REMOVAL    . THYROID CYST EXCISION     nodule exc.  . TUBAL LIGATION       OB History    Gravida  5   Para  4   Term  4   Preterm      AB  1   Living  4     SAB      TAB      Ectopic  1   Multiple      Live Births  4            Home Medications    Prior to Admission medications   Medication Sig Start Date End Date Taking?  Authorizing Provider  ALPRAZolam Prudy Feeler(XANAX) 1 MG tablet Take 1 tablet (1 mg total) by mouth at bedtime as needed for anxiety. Patient not taking: Reported on 04/10/2018 01/30/18   Mardella LaymanHagler, Brian, MD  ARIPiprazole (ABILIFY) 5 MG tablet Take 5 mg by mouth 3 (three) times daily.    [provider]  cetirizine-pseudoephedrine (ZYRTEC-D) 5-120 MG tablet Take 1 tablet by mouth daily. Patient not taking: Reported on 01/30/2018 09/03/17   Belinda FisherYu, Amy V, PA-C  clindamycin (CLEOCIN) 2 % vaginal cream Place 1 Applicatorful vaginally at bedtime. Patient not taking: Reported on 01/30/2018 08/13/17   Aviva KluverMurray, Alyssa B, PA-C  clonazePAM (KLONOPIN) 0.5 MG tablet Take 0.25-0.5 mg by mouth 3 (three) times daily as needed for anxiety.  03/27/18   [provider]  dicyclomine (BENTYL) 20 MG  tablet Take 1 tablet (20 mg total) by mouth 4 (four) times daily -  before meals and at bedtime. Patient not taking: Reported on 01/30/2018 08/13/17 08/23/17  Aviva KluverMurray, Alyssa B, PA-C  fluticasone (FLONASE) 50 MCG/ACT nasal spray Place 2 sprays into both nostrils daily. Patient not taking: Reported on 01/30/2018 09/03/17   Belinda FisherYu, Amy V, PA-C  gabapentin (NEURONTIN) 300 MG capsule Take 1 capsule (300 mg total) by mouth 3 (three) times daily. Patient not taking: Reported on 01/30/2018 05/30/17   Brock BadHarper, Charles A, MD  HYDROcodone-acetaminophen (NORCO/VICODIN) 5-325 MG tablet Take 1-2 tablets by mouth every 6 (six) hours as needed for severe pain. Patient not taking: Reported on 01/30/2018 07/31/17   Joy, Hillard DankerShawn C, PA-C  ibuprofen (ADVIL,MOTRIN) 600 MG tablet Take 1 tablet (600 mg total) by mouth every 6 (six) hours as needed. Patient not taking: Reported on 01/30/2018 07/31/17   Anselm PancoastJoy, Shawn C, PA-C  ibuprofen (ADVIL,MOTRIN) 800 MG tablet Take 1 tablet (800 mg total) by mouth every 8 (eight) hours as needed. Patient not taking: Reported on 04/10/2018 08/22/17   Brock BadHarper, Charles A, MD  methocarbamol (ROBAXIN) 500 MG tablet Take 1 tablet (500 mg total) by mouth at bedtime as needed for muscle spasms. Patient not taking: Reported on 01/30/2018 08/11/17   Mathews RobinsonsMitchell, Jessica B, PA-C  metroNIDAZOLE (METROGEL VAGINAL) 0.75 % vaginal gel Place 1 Applicatorful vaginally 2 (two) times daily. Patient not taking: Reported on 01/30/2018 05/30/17   Brock BadHarper, Charles A, MD  ondansetron (ZOFRAN) 4 MG tablet Take 1 tablet (4 mg total) by mouth every 8 (eight) hours as needed for nausea or vomiting. Patient not taking: Reported on 01/30/2018 01/29/18   Caccavale, Sophia, PA-C  ondansetron (ZOFRAN-ODT) 4 MG disintegrating tablet Take 1 tablet (4 mg total) by mouth every 8 (eight) hours as needed for nausea or vomiting. Patient not taking: Reported on 01/30/2018 10/16/17   Benjiman CorePickering, Nathan, MD  oxyCODONE-acetaminophen (PERCOCET) 7.5-325 MG tablet  Take 1 tablet by mouth 3 (three) times daily as needed (pain). Patient taking differently: Take 1 tablet by mouth 3 (three) times daily as needed for moderate pain.  01/30/18   Mardella LaymanHagler, Brian, MD  pantoprazole (PROTONIX) 40 MG tablet Take 1 tablet (40 mg total) daily by mouth. Patient not taking: Reported on 01/30/2018 09/20/17   Pricilla LovelessGoldston, Scott, MD  promethazine (PHENERGAN) 25 MG tablet Take 1 tablet (25 mg total) by mouth every 6 (six) hours as needed for nausea or vomiting. Patient not taking: Reported on 01/30/2018 05/27/17   Vanetta MuldersZackowski, Scott, MD  sertraline (ZOLOFT) 100 MG tablet Take 1 tablet (100 mg total) daily by mouth. Patient not taking: Reported on 01/30/2018 09/20/17   Pricilla LovelessGoldston, Scott, MD  zolpidem Remus Loffler(AMBIEN)  10 MG tablet Take 10 mg by mouth at bedtime. 03/14/18   [provider]    Family History Family History  Problem Relation Age of Onset  . Heart disease Mother   . Cancer Maternal Grandmother        breast  . Cancer Paternal Grandmother        breast    Social History Social History   Tobacco Use  . Smoking status: Current Every Day Smoker    Years: 3.00    Types: Cigars  . Smokeless tobacco: Never Used  . Tobacco comment: 1-2 cig./day  Substance Use Topics  . Alcohol use: Not Currently    Alcohol/week: 0.0 oz    Comment: occ  . Drug use: Not Currently     Allergies   Latuda [lurasidone hcl]; Sulfa antibiotics; Bee venom; Flagyl [metronidazole]; Naproxen; Tramadol; and Toradol [ketorolac tromethamine]   Review of Systems Review of Systems  Constitutional: Negative for diaphoresis.  Respiratory: Negative for cough, sputum production and shortness of breath.   Cardiovascular: Positive for chest pain.  Gastrointestinal: Positive for abdominal pain. Negative for nausea.  All other systems reviewed and are negative.    Physical Exam Updated Vital Signs BP (!) 148/94 (BP Location: Left Arm)   Pulse 90   Temp 98.4 F (36.9 C) (Oral)   Resp 18   Ht 5'  7" (1.702 m)   Wt 79.4 kg (175 lb)   LMP 05/16/2018   SpO2 100%   BMI 27.41 kg/m   Physical Exam  Constitutional: She is oriented to person, place, and time. She appears well-developed and well-nourished. No distress.  HENT:  Head: Normocephalic and atraumatic.  Neck: Normal range of motion. Neck supple.  Cardiovascular: Normal rate and regular rhythm. Exam reveals no gallop and no friction rub.  No murmur heard. Pulmonary/Chest: Effort normal and breath sounds normal. No respiratory distress. She has no wheezes.  Abdominal: Soft. Bowel sounds are normal. She exhibits no distension. There is tenderness. There is no rebound and no guarding.  There is mild ttp in the epigastric region.  Musculoskeletal: Normal range of motion.  Neurological: She is alert and oriented to person, place, and time.  Skin: Skin is warm and dry. She is not diaphoretic.  Nursing note and vitals reviewed.    ED Treatments / Results  Labs (all labs ordered are listed, but only abnormal results are displayed) Labs Reviewed  URINALYSIS, ROUTINE W REFLEX MICROSCOPIC  PREGNANCY, URINE  COMPREHENSIVE METABOLIC PANEL  LIPASE, BLOOD  CBC WITH DIFFERENTIAL/PLATELET    EKG EKG Interpretation  Date/Time:  Thursday May 30 2018 16:44:57 EDT Ventricular Rate:  87 PR Interval:  170 QRS Duration: 84 QT Interval:  374 QTC Calculation: 450 R Axis:   19 Text Interpretation:  Normal sinus rhythm Normal ECG Confirmed by Geoffery Lyons ([id number]) on 05/30/2018 5:00:45 PM   Radiology No results found.  Procedures Procedures (including critical care time)  Medications Ordered in ED Medications  sodium chloride 0.9 % bolus 1,000 mL (has no administration in time range)     Initial Impression / Assessment and Plan / ED Course  I have reviewed the triage vital signs and the nursing notes.  Pertinent labs & imaging results that were available during my care of the patient were reviewed by me and considered in my  medical decision making (see chart for details).  ultrasound shows no evidence for gallbladder disease.  Laboratory studies are essentially unremarkable.  There are very slight elevations of her  LFTs and lipase, the significance of which I am uncertain.  She will be discharged with refills of the psychiatric medications she has requested and follow-up as needed for any problems.  Final Clinical Impressions(s) / ED Diagnoses   Final diagnoses:  Abdominal pain    ED Discharge Orders    None       Geoffery Lyons, MD 05/30/18 [id number]    Geoffery Lyons, MD 05/31/18 Dorna Mai    Geoffery Lyons, MD 06/24/18 [id number]

## 2018-05-30 NOTE — ED Notes (Signed)
Pt requesting something for pain, does not have a ride home and is allergic to the non-drowsy pain relievers

## 2018-06-11 ENCOUNTER — Emergency Department (HOSPITAL_BASED_OUTPATIENT_CLINIC_OR_DEPARTMENT_OTHER): Payer: Medicaid Other

## 2018-06-11 ENCOUNTER — Other Ambulatory Visit: Payer: Self-pay

## 2018-06-11 ENCOUNTER — Emergency Department (HOSPITAL_BASED_OUTPATIENT_CLINIC_OR_DEPARTMENT_OTHER)
Admission: EM | Admit: 2018-06-11 | Discharge: 2018-06-11 | Disposition: A | Discharge status: Home / Self Care | Payer: Medicaid Other | Attending: Emergency Medicine | Admitting: Emergency Medicine

## 2018-06-11 ENCOUNTER — Encounter (HOSPITAL_BASED_OUTPATIENT_CLINIC_OR_DEPARTMENT_OTHER): Payer: Self-pay | Admitting: Emergency Medicine

## 2018-06-11 DIAGNOSIS — F17290 Nicotine dependence, other tobacco product, uncomplicated: Secondary | ICD-10-CM | POA: Diagnosis not present

## 2018-06-11 DIAGNOSIS — N898 Other specified noninflammatory disorders of vagina: Secondary | ICD-10-CM | POA: Insufficient documentation

## 2018-06-11 DIAGNOSIS — R1032 Left lower quadrant pain: Secondary | ICD-10-CM | POA: Insufficient documentation

## 2018-06-11 DIAGNOSIS — R109 Unspecified abdominal pain: Secondary | ICD-10-CM | POA: Diagnosis present

## 2018-06-11 LAB — WET PREP, GENITAL
Clue Cells Wet Prep HPF POC: NONE SEEN
Sperm: NONE SEEN
Trich, Wet Prep: NONE SEEN
Yeast Wet Prep HPF POC: NONE SEEN

## 2018-06-11 LAB — CBC WITH DIFFERENTIAL/PLATELET
Basophils Absolute: 0 10*3/uL (ref 0.0–0.1)
Basophils Relative: 0 %
Eosinophils Absolute: 0.5 10*3/uL (ref 0.0–0.7)
Eosinophils Relative: 5 %
HCT: 33.6 % — ABNORMAL LOW (ref 36.0–46.0)
Hemoglobin: 12.1 g/dL (ref 12.0–15.0)
Lymphocytes Relative: 19 %
Lymphs Abs: 1.9 10*3/uL (ref 0.7–4.0)
MCH: 27.4 pg (ref 26.0–34.0)
MCHC: 36 g/dL (ref 30.0–36.0)
MCV: 76.2 fL — ABNORMAL LOW (ref 78.0–100.0)
Monocytes Absolute: 1 10*3/uL (ref 0.1–1.0)
Monocytes Relative: 10 %
Neutro Abs: 6.4 10*3/uL (ref 1.7–7.7)
Neutrophils Relative %: 66 %
Platelets: 231 10*3/uL (ref 150–400)
RBC: 4.41 MIL/uL (ref 3.87–5.11)
RDW: 13.3 % (ref 11.5–15.5)
WBC: 9.8 10*3/uL (ref 4.0–10.5)

## 2018-06-11 LAB — URINALYSIS, ROUTINE W REFLEX MICROSCOPIC
Bilirubin Urine: NEGATIVE
Glucose, UA: NEGATIVE mg/dL
Hgb urine dipstick: NEGATIVE
Ketones, ur: NEGATIVE mg/dL
Leukocytes, UA: NEGATIVE
Nitrite: NEGATIVE
Protein, ur: NEGATIVE mg/dL
Specific Gravity, Urine: 1.01 (ref 1.005–1.030)
pH: 7 (ref 5.0–8.0)

## 2018-06-11 LAB — COMPREHENSIVE METABOLIC PANEL
ALT: 66 U/L — ABNORMAL HIGH (ref 0–44)
AST: 39 U/L (ref 15–41)
Albumin: 3.2 g/dL — ABNORMAL LOW (ref 3.5–5.0)
Alkaline Phosphatase: 84 U/L (ref 38–126)
Anion gap: 6 (ref 5–15)
BUN: 9 mg/dL (ref 6–20)
CO2: 26 mmol/L (ref 22–32)
Calcium: 8.5 mg/dL — ABNORMAL LOW (ref 8.9–10.3)
Chloride: 106 mmol/L (ref 98–111)
Creatinine, Ser: 0.47 mg/dL (ref 0.44–1.00)
GFR calc Af Amer: >60 mL/min (ref >60)
GFR calc non Af Amer: >60 mL/min (ref >60)
Glucose, Bld: 90 mg/dL (ref 70–99)
Potassium: 3.7 mmol/L (ref 3.5–5.1)
Sodium: 138 mmol/L (ref 135–145)
Total Bilirubin: 0.4 mg/dL (ref 0.3–1.2)
Total Protein: 6 g/dL — ABNORMAL LOW (ref 6.5–8.1)

## 2018-06-11 LAB — PREGNANCY, URINE: Preg Test, Ur: NEGATIVE

## 2018-06-11 MED ORDER — KETOROLAC TROMETHAMINE 30 MG/ML IJ SOLN
30.0000 mg | Freq: Once | INTRAMUSCULAR | Status: AC
Start: 2018-06-11 — End: 2018-06-11
  Administered 2018-06-11: 30 mg via INTRAMUSCULAR
  Filled 2018-06-11: qty 1

## 2018-06-11 MED ORDER — ACETAMINOPHEN 500 MG PO TABS
1000.0000 mg | ORAL_TABLET | Freq: Once | ORAL | Status: AC
  Administered 2018-06-11: 1000 mg via ORAL
  Filled 2018-06-11: qty 2

## 2018-06-11 NOTE — Discharge Instructions (Signed)

## 2018-06-11 NOTE — ED Provider Notes (Signed)
Emergency Department Provider Note   I have reviewed the triage vital signs and the nursing notes.   HISTORY  Chief Complaint Abdominal Pain   HPI Leah Barry is a 32 y.o. female with PMH of Bipolar disorder, PID, and anxiety resents to the emergency department for evaluation of left lower quadrant abdominal pain for the past week.  She feels some mild swelling in this area and occasionally has radiation of pain into the anterior left leg.  She has some associated dysuria and urine frequency.  She denies vomiting or diarrhea.  Is been taking Motrin for pain with some mild relief in symptoms.  No pain in the lower back.  Denies chest pain or difficulty breathing.  Denies fevers or shaking chills. Does note a vaginal discharge with some burning.    Past Medical History:  Diagnosis Date  . Anxiety   . Apnea, sleep    no CPAP use, states lost machine during move to [place]  . Bipolar 1 disorder (HCC)   . Bipolar disorder (HCC)    no current med.  . Depression   . History of MRSA infection 2010  . Irritable bowel syndrome (IBS)    no current med.  . Pelvic inflammatory disease   . Scalp laceration    staples to be removed 03/13/2014  . Ulnar fracture 03/09/2014   left    Patient Active Problem List   Diagnosis Date Noted  . Dysmenorrhea 05/28/2014  . GC (gonococcus) 05/28/2014  . Chlamydia contact 05/28/2014  . Pain 04/14/2014  . UTI symptoms 04/14/2014  . Routine general medical examination at a health care facility 09/26/2013  . Candidiasis of vulva and vagina 09/26/2013  . Unspecified symptom associated with female genital organs 09/10/2013  . Abnormal uterine bleeding (AUB) 09/10/2013  . PID (pelvic inflammatory disease) 07/29/2013  . Dysuria 07/29/2013  . Screening examination for venereal disease 07/29/2013  . Chronic PID (chronic pelvic inflammatory disease) 06/30/2013  . Excessive or frequent menstruation 06/30/2013  . Genital herpes, unspecified 06/30/2013  .  Unspecified inflammatory disease of female pelvic organs and tissues 03/12/2013    Past Surgical History:  Procedure Laterality Date  . CYST EXCISION     middle of back  . ORIF ULNAR FRACTURE Left 03/16/2014   Procedure: OPEN REDUCTION INTERNAL FIXATION (ORIF) LEFT ULNAR FRACTURE;  Surgeon: Marlowe Shores, MD;  Location: [facility];  Service: Orthopedics;  Laterality: Left;  left  . OVARIAN CYST REMOVAL    . THYROID CYST EXCISION     nodule exc.  . TUBAL LIGATION       Allergies Latuda [lurasidone hcl]; Sulfa antibiotics; Bee venom; Flagyl [metronidazole]; Naproxen; Tramadol; and Toradol [ketorolac tromethamine]  Family History  Problem Relation Age of Onset  . Heart disease Mother   . Cancer Maternal Grandmother        breast  . Cancer Paternal Grandmother        breast    Social History Social History   Tobacco Use  . Smoking status: Current Every Day Smoker    Years: 3.00    Types: Cigars  . Smokeless tobacco: Never Used  . Tobacco comment: 1-2 cig./day  Substance Use Topics  . Alcohol use: Not Currently    Alcohol/week: 0.0 oz    Comment: occ  . Drug use: Not Currently    Review of Systems  Constitutional: No fever/chills Eyes: No visual changes. ENT: No sore throat. Cardiovascular: Denies chest pain. Respiratory: Denies shortness of breath.  Gastrointestinal: Positive LLQ abdominal pain.  No nausea, no vomiting.  No diarrhea.  No constipation. Genitourinary: Positive for dysuria. Musculoskeletal: Negative for back pain. Skin: Negative for rash. Neurological: Negative for headaches, focal weakness or numbness.  10-point ROS otherwise negative.  ____________________________________________   PHYSICAL EXAM:  VITAL SIGNS: ED Triage Vitals [06/11/18 1003]  Enc Vitals Group     BP 126/84     Pulse Rate 86     Resp 16     Temp 98.3 F (36.8 C)     Temp Source Oral     SpO2 97 %     Weight 175 lb (79.4 kg)     Height 5\' 7"  (1.702  m)     Pain Score 7   Constitutional: Alert and oriented. Well appearing and in no acute distress. Eyes: Conjunctivae are normal.  Head: Atraumatic. Nose: No congestion/rhinnorhea. Mouth/Throat: Mucous membranes are moist. Neck: No stridor. Cardiovascular: Normal rate, regular rhythm. Good peripheral circulation. Grossly normal heart sounds.   Respiratory: Normal respiratory effort.  No retractions. Lungs CTAB. Gastrointestinal: Soft with focal LLQ tenderness without rebound or guarding. No distention.  Genitourinary: Exam performed with chaperone. Patient with mild discharge. No CMT. Patient does have left adnexal tenderness on bimanual. No appreciable mass.  Musculoskeletal: No lower extremity tenderness nor edema. No gross deformities of extremities. Neurologic:  Normal speech and language. No gross focal neurologic deficits are appreciated.  Skin:  Skin is warm, dry and intact. No rash noted.  ____________________________________________   LABS (all labs ordered are listed, but only abnormal results are displayed)  Labs Reviewed  WET PREP, GENITAL - Abnormal; Notable for the following components:      Result Value   WBC, Wet Prep HPF POC MANY (*)    All other components within normal limits  COMPREHENSIVE METABOLIC PANEL - Abnormal; Notable for the following components:   Calcium 8.5 (*)    Total Protein 6.0 (*)    Albumin 3.2 (*)    ALT 66 (*)    All other components within normal limits  CBC WITH DIFFERENTIAL/PLATELET - Abnormal; Notable for the following components:   HCT 33.6 (*)    MCV 76.2 (*)    All other components within normal limits  URINALYSIS, ROUTINE W REFLEX MICROSCOPIC  PREGNANCY, URINE  GC/CHLAMYDIA PROBE AMP () NOT AT [facility]   ____________________________________________  RADIOLOGY  US Transvaginal Non-ob  Result Date: 06/11/2018 CLINICAL DATA:  Worsening left lower quadrant and pelvic pain for 1 week. Previous ovarian cysts. EXAM:  TRANSABDOMINAL AND TRANSVAGINAL ULTRASOUND OF PELVIS DOPPLER ULTRASOUND OF OVARIES TECHNIQUE: Both transabdominal and transvaginal ultrasound examinations of the pelvis were performed. Transabdominal technique was performed for global imaging of the pelvis including uterus, ovaries, adnexal regions, and pelvic cul-de-sac. It was necessary to proceed with endovaginal exam following the transabdominal exam to visualize the endometrium and ovaries. Color and duplex Doppler ultrasound was utilized to evaluate blood flow to the ovaries. COMPARISON:  08/12/2017 FINDINGS: Uterus Measurements: 10.6 x 5.4 x 6.7 cm. No fibroids or other mass visualized. Endometrium Thickness: 5 mm.  No focal abnormality visualized. Right ovary Measurements: 5.8 x 3.1 x 4.4 cm. Simple follicular cyst is seen which measures 4.6 x 2.7 x 4.3 cm. A few other smaller follicles also noted. Left ovary Measurements: 3.1 x 1.4 x 2.1 cm. A 1 cm ovarian or paraovarian cyst is noted. Pulsed Doppler evaluation of both ovaries demonstrates normal low-resistance arterial and venous waveforms. Other findings Trace amount of simple free  fluid noted. IMPRESSION: 4.6 cm benign-appearing follicular cyst in right ovary. No imaging follow-up required in a reproductive age female. No other significant abnormality identified. No sonographic evidence for ovarian torsion. Electronically Signed   By: Myles Rosenthal M.D.   On: 06/11/2018 12:52   US Pelvis Complete  Result Date: 06/11/2018 CLINICAL DATA:  Worsening left lower quadrant and pelvic pain for 1 week. Previous ovarian cysts. EXAM: TRANSABDOMINAL AND TRANSVAGINAL ULTRASOUND OF PELVIS DOPPLER ULTRASOUND OF OVARIES TECHNIQUE: Both transabdominal and transvaginal ultrasound examinations of the pelvis were performed. Transabdominal technique was performed for global imaging of the pelvis including uterus, ovaries, adnexal regions, and pelvic cul-de-sac. It was necessary to proceed with endovaginal exam following  the transabdominal exam to visualize the endometrium and ovaries. Color and duplex Doppler ultrasound was utilized to evaluate blood flow to the ovaries. COMPARISON:  08/12/2017 FINDINGS: Uterus Measurements: 10.6 x 5.4 x 6.7 cm. No fibroids or other mass visualized. Endometrium Thickness: 5 mm.  No focal abnormality visualized. Right ovary Measurements: 5.8 x 3.1 x 4.4 cm. Simple follicular cyst is seen which measures 4.6 x 2.7 x 4.3 cm. A few other smaller follicles also noted. Left ovary Measurements: 3.1 x 1.4 x 2.1 cm. A 1 cm ovarian or paraovarian cyst is noted. Pulsed Doppler evaluation of both ovaries demonstrates normal low-resistance arterial and venous waveforms. Other findings Trace amount of simple free fluid noted. IMPRESSION: 4.6 cm benign-appearing follicular cyst in right ovary. No imaging follow-up required in a reproductive age female. No other significant abnormality identified. No sonographic evidence for ovarian torsion. Electronically Signed   By: Myles Rosenthal M.D.   On: 06/11/2018 12:52   [place] Art/ven Flow Abd Pelv Doppler  Result Date: 06/11/2018 CLINICAL DATA:  Worsening left lower quadrant and pelvic pain for 1 week. Previous ovarian cysts. EXAM: TRANSABDOMINAL AND TRANSVAGINAL ULTRASOUND OF PELVIS DOPPLER ULTRASOUND OF OVARIES TECHNIQUE: Both transabdominal and transvaginal ultrasound examinations of the pelvis were performed. Transabdominal technique was performed for global imaging of the pelvis including uterus, ovaries, adnexal regions, and pelvic cul-de-sac. It was necessary to proceed with endovaginal exam following the transabdominal exam to visualize the endometrium and ovaries. Color and duplex Doppler ultrasound was utilized to evaluate blood flow to the ovaries. COMPARISON:  08/12/2017 FINDINGS: Uterus Measurements: 10.6 x 5.4 x 6.7 cm. No fibroids or other mass visualized. Endometrium Thickness: 5 mm.  No focal abnormality visualized. Right ovary Measurements: 5.8 x 3.1 x 4.4  cm. Simple follicular cyst is seen which measures 4.6 x 2.7 x 4.3 cm. A few other smaller follicles also noted. Left ovary Measurements: 3.1 x 1.4 x 2.1 cm. A 1 cm ovarian or paraovarian cyst is noted. Pulsed Doppler evaluation of both ovaries demonstrates normal low-resistance arterial and venous waveforms. Other findings Trace amount of simple free fluid noted. IMPRESSION: 4.6 cm benign-appearing follicular cyst in right ovary. No imaging follow-up required in a reproductive age female. No other significant abnormality identified. No sonographic evidence for ovarian torsion. Electronically Signed   By: Myles Rosenthal M.D.   On: 06/11/2018 12:52    ____________________________________________   PROCEDURES  Procedure(s) performed:   Procedures  None ____________________________________________   INITIAL IMPRESSION / ASSESSMENT AND PLAN / ED COURSE  Pertinent labs & imaging results that were available during my care of the patient were reviewed by me and considered in my medical decision making (see chart for details).  Patient presents to the emergency department with left lower quadrant abdominal pain over the past week.  Symptoms  are slightly worse with moving but does have some focal tenderness.  He has a history of pelvic inflammatory disease and does report some vaginal discharge.  Plan for pelvic exam, STD check, wet prep.  Suspect that her pain is likely pelvic in etiology.  Will obtain an ultrasound to rule out ovarian cyst versus torsion.   Labs reviewed with no acute findings. TVUS with no acute findings. Benign cyst appreciated on the side opposite the patient's pain. No torsion. Plan for tylenol/motrin at home and following STD testing. Patient does not suspect exposure so will withhold treatment until testing is back. No concern for PID/TOA clinically.   At this time, I do not feel there is any life-threatening condition present. I have reviewed and discussed all results (EKG,  imaging, lab, urine as appropriate), exam findings with patient. I have reviewed nursing notes and appropriate previous records.  I feel the patient is safe to be discharged home without further emergent workup. Discussed usual and customary return precautions. Patient and family (if present) verbalize understanding and are comfortable with this plan.  Patient will follow-up with their primary care provider. If they do not have a primary care provider, information for follow-up has been provided to them. All questions have been answered.  ____________________________________________  FINAL CLINICAL IMPRESSION(S) / ED DIAGNOSES  Final diagnoses:  LLQ abdominal pain  Vaginal discharge     MEDICATIONS GIVEN DURING THIS VISIT:  Medications  acetaminophen (TYLENOL) tablet 1,000 mg (1,000 mg Oral Given 06/11/18 1151)  ketorolac (TORADOL) 30 MG/ML injection 30 mg (30 mg Intramuscular Given 06/11/18 1325)    Note:  This document was prepared using Dragon voice recognition software and may include unintentional dictation errors.  Alona BeneJoshua Madalin Hughart, MD Emergency Medicine    Magaline Steinberg, Arlyss RepressJoshua G, MD 06/11/18 1640

## 2018-06-11 NOTE — ED Notes (Signed)
Patient claimed that she has some foul vaginal odor.

## 2018-06-11 NOTE — ED Triage Notes (Addendum)
Reports left lower abdominal pain x 1 week.  States her abdomen is swollen and the swelling is "going into my leg".  Reports dysuria and urinary frequency with nausea.  Denies vomiting and diarrhea. Patient from [facility].

## 2018-06-12 LAB — GC/CHLAMYDIA PROBE AMP (~~LOC~~) NOT AT ARMC
Chlamydia: NEGATIVE
Neisseria Gonorrhea: NEGATIVE

## 2018-06-15 ENCOUNTER — Encounter (HOSPITAL_BASED_OUTPATIENT_CLINIC_OR_DEPARTMENT_OTHER): Payer: Self-pay | Admitting: Emergency Medicine

## 2018-06-15 ENCOUNTER — Emergency Department (HOSPITAL_BASED_OUTPATIENT_CLINIC_OR_DEPARTMENT_OTHER)
Admission: EM | Admit: 2018-06-15 | Discharge: 2018-06-15 | Disposition: A | Discharge status: Home / Self Care | Payer: Medicaid Other | Attending: Emergency Medicine | Admitting: Emergency Medicine

## 2018-06-15 DIAGNOSIS — N939 Abnormal uterine and vaginal bleeding, unspecified: Secondary | ICD-10-CM | POA: Diagnosis not present

## 2018-06-15 DIAGNOSIS — F17290 Nicotine dependence, other tobacco product, uncomplicated: Secondary | ICD-10-CM | POA: Diagnosis not present

## 2018-06-15 DIAGNOSIS — Z79899 Other long term (current) drug therapy: Secondary | ICD-10-CM | POA: Diagnosis not present

## 2018-06-15 DIAGNOSIS — Z3202 Encounter for pregnancy test, result negative: Secondary | ICD-10-CM | POA: Diagnosis not present

## 2018-06-15 DIAGNOSIS — R1032 Left lower quadrant pain: Secondary | ICD-10-CM | POA: Diagnosis not present

## 2018-06-15 DIAGNOSIS — R1030 Lower abdominal pain, unspecified: Secondary | ICD-10-CM

## 2018-06-15 LAB — RAPID URINE DRUG SCREEN, HOSP PERFORMED
Amphetamines: NOT DETECTED
Barbiturates: NOT DETECTED
Benzodiazepines: NOT DETECTED
Cocaine: NOT DETECTED
Opiates: NOT DETECTED
Tetrahydrocannabinol: NOT DETECTED

## 2018-06-15 LAB — URINALYSIS, MICROSCOPIC (REFLEX): WBC, UA: NONE SEEN WBC/hpf (ref 0–5)

## 2018-06-15 LAB — COMPREHENSIVE METABOLIC PANEL
ALT: 72 U/L — ABNORMAL HIGH (ref 0–44)
AST: 47 U/L — ABNORMAL HIGH (ref 15–41)
Albumin: 3.5 g/dL (ref 3.5–5.0)
Alkaline Phosphatase: 87 U/L (ref 38–126)
Anion gap: 6 (ref 5–15)
BUN: 10 mg/dL (ref 6–20)
CO2: 27 mmol/L (ref 22–32)
Calcium: 8.7 mg/dL — ABNORMAL LOW (ref 8.9–10.3)
Chloride: 105 mmol/L (ref 98–111)
Creatinine, Ser: 0.51 mg/dL (ref 0.44–1.00)
GFR calc Af Amer: >60 mL/min (ref >60)
GFR calc non Af Amer: >60 mL/min (ref >60)
Glucose, Bld: 93 mg/dL (ref 70–99)
Potassium: 3.6 mmol/L (ref 3.5–5.1)
Sodium: 138 mmol/L (ref 135–145)
Total Bilirubin: 0.3 mg/dL (ref 0.3–1.2)
Total Protein: 6.5 g/dL (ref 6.5–8.1)

## 2018-06-15 LAB — URINALYSIS, ROUTINE W REFLEX MICROSCOPIC
Bilirubin Urine: NEGATIVE
Glucose, UA: NEGATIVE mg/dL
Ketones, ur: NEGATIVE mg/dL
Leukocytes, UA: NEGATIVE
Nitrite: NEGATIVE
Protein, ur: NEGATIVE mg/dL
Specific Gravity, Urine: 1.015 (ref 1.005–1.030)
pH: 6 (ref 5.0–8.0)

## 2018-06-15 LAB — CBC WITH DIFFERENTIAL/PLATELET
Basophils Absolute: 0 10*3/uL (ref 0.0–0.1)
Basophils Relative: 0 %
Eosinophils Absolute: 0.5 10*3/uL (ref 0.0–0.7)
Eosinophils Relative: 6 %
HCT: 34.1 % — ABNORMAL LOW (ref 36.0–46.0)
Hemoglobin: 12.3 g/dL (ref 12.0–15.0)
Lymphocytes Relative: 21 %
Lymphs Abs: 1.8 10*3/uL (ref 0.7–4.0)
MCH: 27.4 pg (ref 26.0–34.0)
MCHC: 36.1 g/dL — ABNORMAL HIGH (ref 30.0–36.0)
MCV: 75.9 fL — ABNORMAL LOW (ref 78.0–100.0)
Monocytes Absolute: 0.8 10*3/uL (ref 0.1–1.0)
Monocytes Relative: 9 %
Neutro Abs: 5.5 10*3/uL (ref 1.7–7.7)
Neutrophils Relative %: 64 %
Platelets: 247 10*3/uL (ref 150–400)
RBC: 4.49 MIL/uL (ref 3.87–5.11)
RDW: 13.3 % (ref 11.5–15.5)
WBC: 8.6 10*3/uL (ref 4.0–10.5)

## 2018-06-15 LAB — PREGNANCY, URINE: Preg Test, Ur: NEGATIVE

## 2018-06-15 LAB — LIPASE, BLOOD: Lipase: 42 U/L (ref 11–51)

## 2018-06-15 MED ORDER — ACETAMINOPHEN 325 MG PO TABS
650.0000 mg | ORAL_TABLET | Freq: Once | ORAL | Status: AC
  Administered 2018-06-15: 650 mg via ORAL
  Filled 2018-06-15: qty 2

## 2018-06-15 NOTE — ED Notes (Addendum)
Pt reports that she has been seen here for same and that she feels we are not getting to the bottom of her problem. Pt reports that she was put out of the rehab place because "They are not a medical facility, so they medically discharged me from there." pt wants to speak with someone in charge, charge nurse aware.

## 2018-06-15 NOTE — ED Triage Notes (Signed)
Ongoing abd pain for over a week. Hematuria this morning.

## 2018-06-15 NOTE — ED Provider Notes (Signed)
[facility] EMERGENCY DEPARTMENT Provider Note   CSN: [id number] Arrival date & time: 06/15/18  1047     History   Chief Complaint Chief Complaint  Patient presents with  . Hematuria  . Abdominal Pain    HPI JENNIER SCHISSLER is a 32 y.o. female with PMH/o Anxiety, Bipolar, Depression, IBS, PID who presents for continued left lower quadrant abdominal pain is been ongoing for last week.  Additionally, patient reports she started having hematuria today.  Patient also reports one episode of vomiting 2 days ago.  Patient was seen here on 06/11/2018 for evaluation of same symptoms.  At that time, she had a work-up that was unremarkable.  She was discharged home.  Patient reports that she is continued having pain.  Additionally reports no hematuria.  Patient is very upset because she feels like she should still not be having pain and feels like she needs more extensive work-up.  Patient states that she was currently in a rehab facility for cocaine abuse and states that she was kicked out of the rehab facility because she needed to continue to come back to the emergency department for her pain.  Patient reports one episode of nonbloody, nonbilious vomiting 2 days ago.  None since.  Patient states that she has been having some vaginal discharge but states that she always has some discharge.  She states her last menstrual cycle was July 4.  Patient states that she has been taking Motrin for the pain with minimal improvement in symptoms.  Patient denies any fevers, chest pain, nuchal to breathing, vaginal bleeding, urinary complaints.  The history is provided by the patient.    Past Medical History:  Diagnosis Date  . Anxiety   . Apnea, sleep    no CPAP use, states lost machine during move to [place]  . Bipolar 1 disorder (HCC)   . Bipolar disorder (HCC)    no current med.  . Depression   . History of MRSA infection 2010  . Irritable bowel syndrome (IBS)    no current med.  . Pelvic  inflammatory disease   . Scalp laceration    staples to be removed 03/13/2014  . Ulnar fracture 03/09/2014   left    Patient Active Problem List   Diagnosis Date Noted  . Dysmenorrhea 05/28/2014  . GC (gonococcus) 05/28/2014  . Chlamydia contact 05/28/2014  . Pain 04/14/2014  . UTI symptoms 04/14/2014  . Routine general medical examination at a health care facility 09/26/2013  . Candidiasis of vulva and vagina 09/26/2013  . Unspecified symptom associated with female genital organs 09/10/2013  . Abnormal uterine bleeding (AUB) 09/10/2013  . PID (pelvic inflammatory disease) 07/29/2013  . Dysuria 07/29/2013  . Screening examination for venereal disease 07/29/2013  . Chronic PID (chronic pelvic inflammatory disease) 06/30/2013  . Excessive or frequent menstruation 06/30/2013  . Genital herpes, unspecified 06/30/2013  . Unspecified inflammatory disease of female pelvic organs and tissues 03/12/2013    Past Surgical History:  Procedure Laterality Date  . CYST EXCISION     middle of back  . ORIF ULNAR FRACTURE Left 03/16/2014   Procedure: OPEN REDUCTION INTERNAL FIXATION (ORIF) LEFT ULNAR FRACTURE;  Surgeon: Marlowe Shores, MD;  Location: [facility];  Service: Orthopedics;  Laterality: Left;  left  . OVARIAN CYST REMOVAL    . THYROID CYST EXCISION     nodule exc.  . TUBAL LIGATION       OB History    Gravida  5   Para  4   Term  4   Preterm      AB  1   Living  4     SAB      TAB      Ectopic  1   Multiple      Live Births  4            Home Medications    Prior to Admission medications   Medication Sig Start Date End Date Taking? Authorizing Provider  ARIPiprazole (ABILIFY) 5 MG tablet Take 1 tablet (5 mg total) by mouth 3 (three) times daily. 05/30/18   Geoffery Lyons, MD  hydrOXYzine (ATARAX/VISTARIL) 25 MG tablet Take 1 tablet (25 mg total) by mouth 2 (two) times daily as needed. 05/30/18   Geoffery Lyons, MD    Family  History Family History  Problem Relation Age of Onset  . Heart disease Mother   . Cancer Maternal Grandmother        breast  . Cancer Paternal Grandmother        breast    Social History Social History   Tobacco Use  . Smoking status: Current Every Day Smoker    Years: 3.00    Types: Cigars  . Smokeless tobacco: Never Used  . Tobacco comment: 1-2 cig./day  Substance Use Topics  . Alcohol use: Not Currently    Alcohol/week: 0.0 oz    Comment: occ  . Drug use: Not Currently     Allergies   Latuda [lurasidone hcl]; Sulfa antibiotics; Bee venom; Flagyl [metronidazole]; Naproxen; and Tramadol   Review of Systems Review of Systems  Constitutional: Negative for chills and fever.  HENT: Negative for congestion.   Eyes: Negative for visual disturbance.  Respiratory: Negative for cough and shortness of breath.   Cardiovascular: Negative for chest pain.  Gastrointestinal: Positive for abdominal pain. Negative for diarrhea, nausea and vomiting.  Genitourinary: Positive for hematuria. Negative for dysuria.  Musculoskeletal: Negative for back pain and neck pain.  Skin: Negative for rash.  Neurological: Negative for dizziness, weakness, numbness and headaches.  Psychiatric/Behavioral: Negative for confusion.  All other systems reviewed and are negative.    Physical Exam Updated Vital Signs BP 131/77 (BP Location: Right Arm)   Pulse 76   Temp 98.3 F (36.8 C) (Oral)   Resp 20   Ht 5\' 9"  (1.753 m)   Wt 86.6 kg (191 lb)   LMP 05/16/2018   SpO2 98%   BMI 28.21 kg/m   Physical Exam  Constitutional: She is oriented to person, place, and time. She appears well-developed and well-nourished.  Sitting comfortably on examination table  HENT:  Head: Normocephalic and atraumatic.  Mouth/Throat: Oropharynx is clear and moist and mucous membranes are normal.  Eyes: Pupils are equal, round, and reactive to light. Conjunctivae, EOM and lids are normal.  Neck: Full passive range  of motion without pain.  Cardiovascular: Normal rate, regular rhythm, normal heart sounds and normal pulses. Exam reveals no gallop and no friction rub.  No murmur heard. Pulmonary/Chest: Effort normal and breath sounds normal.  Lungs clear to auscultation bilaterally.  Symmetric chest rise.  No wheezing, rales, rhonchi.  Abdominal: Soft. Normal appearance. There is tenderness in the left lower quadrant. There is no rigidity, no guarding and no CVA tenderness.  Abdomen is soft, nondistended.  Mild tenderness in the left lower quadrant.  No rigidity, guarding.  No peritoneal signs.  Genitourinary: Uterus normal. Cervix exhibits no motion tenderness, no discharge and  no friability. Right adnexum displays no mass and no tenderness. Left adnexum displays no mass and no tenderness. There is bleeding in the vagina.  Genitourinary Comments: The exam was performed with a chaperone present. Normal external female genitalia. No lesions, rash, or sores.  Mild bleeding noted vaginal vault.  No hemorrhaging noted from cervix.  Cervical ls is closed.  No adnexal mass or tenderness bilaterally.  No discharge noted. No CMT.  Musculoskeletal: Normal range of motion.  Neurological: She is alert and oriented to person, place, and time.  Skin: Skin is warm and dry. Capillary refill takes less than 2 seconds.  Psychiatric: She has a normal mood and affect. Her speech is normal.  Nursing note and vitals reviewed.    ED Treatments / Results  Labs (all labs ordered are listed, but only abnormal results are displayed) Labs Reviewed  URINALYSIS, ROUTINE W REFLEX MICROSCOPIC - Abnormal; Notable for the following components:      Result Value   Hgb urine dipstick SMALL (*)    All other components within normal limits  COMPREHENSIVE METABOLIC PANEL - Abnormal; Notable for the following components:   Calcium 8.7 (*)    AST 47 (*)    ALT 72 (*)    All other components within normal limits  CBC WITH  DIFFERENTIAL/PLATELET - Abnormal; Notable for the following components:   HCT 34.1 (*)    MCV 75.9 (*)    MCHC 36.1 (*)    All other components within normal limits  URINALYSIS, MICROSCOPIC (REFLEX) - Abnormal; Notable for the following components:   Bacteria, UA MANY (*)    All other components within normal limits  PREGNANCY, URINE  LIPASE, BLOOD  RAPID URINE DRUG SCREEN, HOSP PERFORMED    EKG None  Radiology No results found.  Procedures Procedures (including critical care time)  Medications Ordered in ED Medications  acetaminophen (TYLENOL) tablet 650 mg (650 mg Oral Given 06/15/18 1323)     Initial Impression / Assessment and Plan / ED Course  I have reviewed the triage vital signs and the nursing notes.  Pertinent labs & imaging results that were available during my care of the patient were reviewed by me and considered in my medical decision making (see chart for details).     33 y.o. F who presents for evaluation of left lower quadrant abdominal pain and hematuria.  Patient was seen here on 06/11/2018 for same symptoms.  Reports she still having left lower quadrant abdominal pain.  Patient reports she was kicked out of her rehab facility because she keeps having to come to the emergency department.  Patient would like reevaluation to see if there is something that was missed.  Patient reports one episode of vomiting 2 days ago but none since.  She has been able to eat and drink without any difficulty.  Patient denies any fever, chest pain, difficulty breathing. Patient is afebrile, non-toxic appearing, sitting comfortably on examination table. Vital signs reviewed and stable.  On exam, patient has some mild tenderness noted to left lower quadrant.  We will plan to recheck her labs.  Pelvic exam as documented above.  Patient did have some bleeding noted in the vaginal vault.  No adnexal mass or tenderness bilaterally.  Cervix without any friability.  No CMT.  Exam is not  concerning for PID.  Patient's LMP was July 4.  I discussed with patient that this could be her menstrual cycle and that the bleeding and cramping could be symptomatic of starting  her cycle.  Lipase unremarkable.  CBC without any significant cytosis or anemia.  CMP does show slight elevation in AST and ALT at 47 and 72 respectively.  Review of records show this consistent with previous.  DS is negative.  Urine pregnancy is negative.  Patient with small amount of hemoglobin noted in your urine.  Otherwise no evidence of infectious etiology.  Review of records from previous visit on 06/11/2018.  Lab work is grossly unchanged.  Found showed a 4.6 cm benign-appearing follicular cyst in the right ovary.  No other acute abnormalities.  Given recent ultrasounds and reassuring pelvic exam, no indication to repeat that ultrasound today here in the department.  Discussed results with patient.  Patient is resting completely without any acute signs of distress.  I discussed with patient regarding her work-up here in the ED.  I explained that this could be likely her menstrual cycle.  I do not feel there is any indication to repeat ultrasound and patient agrees.  Patient instructed to follow-up with women's health for further evaluation and repeat imaging of her ovarian cyst. At this time, patient exhibits no emergent life-threatening condition that require further evaluation in ED. Patient had ample opportunity for questions and discussion. All patient's questions were answered with full understanding. Strict return precautions discussed. Patient expresses understanding and agreement to plan.   Final Clinical Impressions(s) / ED Diagnoses   Final diagnoses:  Lower abdominal pain  Uterine bleeding    ED Discharge Orders    None       Rosana HoesLayden, Dyke Weible A, PA-C 06/15/18 1917    Tilden Fossaees, Elizabeth, MD 06/16/18 [phone]

## 2018-06-15 NOTE — Discharge Instructions (Addendum)
You can take Tylenol or Ibuprofen as directed for pain. You can alternate Tylenol and Ibuprofen every 4 hours. If you take Tylenol at 1pm, then you can take Ibuprofen at 5pm. Then you can take Tylenol again at 9pm.   [facility] directed.  Return to the Emergency Department immediately if you experience any worsening abdominal pain, fever, persistent nausea and vomiting, inability keep any food down, pain with urination, blood in your urine or any other worsening or concerning symptoms.

## 2018-06-16 ENCOUNTER — Encounter (HOSPITAL_COMMUNITY): Payer: Self-pay | Admitting: Emergency Medicine

## 2018-06-16 ENCOUNTER — Ambulatory Visit (HOSPITAL_COMMUNITY)
Admission: EM | Admit: 2018-06-16 | Discharge: 2018-06-16 | Disposition: A | Discharge status: Home / Self Care | Payer: Medicaid Other | Attending: Family Medicine | Admitting: Family Medicine

## 2018-06-16 ENCOUNTER — Telehealth (HOSPITAL_COMMUNITY): Payer: Self-pay | Admitting: Emergency Medicine

## 2018-06-16 DIAGNOSIS — R21 Rash and other nonspecific skin eruption: Secondary | ICD-10-CM

## 2018-06-16 MED ORDER — PREDNISONE 50 MG PO TABS: 50.0000 mg | ORAL_TABLET | Freq: Every day | ORAL | 0 refills | Status: DC

## 2018-06-16 MED ORDER — PREDNISONE 50 MG PO TABS: 50.0000 mg | ORAL_TABLET | Freq: Every day | ORAL | 0 refills | Status: AC

## 2018-06-16 MED ORDER — TRIAMCINOLONE ACETONIDE 0.1 % EX CREA: 1.0000 "application " | TOPICAL_CREAM | Freq: Two times a day (BID) | CUTANEOUS | 0 refills | Status: DC

## 2018-06-16 MED ORDER — CETIRIZINE HCL 10 MG PO TABS: 10.0000 mg | ORAL_TABLET | Freq: Every day | ORAL | 0 refills | Status: DC

## 2018-06-16 NOTE — ED Provider Notes (Signed)
At patient's request, I went in to discuss her medications.  She specifically is requesting refills of narcotic medication and her benzodiazepine.  She states that she called her provider at [facility], and that she was told that to come here for refills.  She states that she was told we would provide them for her.  I questioned her about her emergency room visit yesterday, and the documentation in the emergency room note that she had been discharged from a cocaine rehabilitation program.  She immediately became defensive and angry, and stated that I was "judging" her based on information in the chart that was not true.  She states that was true that she was arrested with cocaine, but it was not hers.  She went to the court ordered rehabilitation upon the advice of her attorney because "it would look good".  She denies having any history of illegal drug use or addiction.  I told her regardless of the chart information, we would not be providing her with controlled substances at this or future visits.  She was angry and asking to speak with my supervisor as she left the office.   Eustace MooreNelson, Hektor Huston Sue, MD 06/16/18 [phone]

## 2018-06-16 NOTE — ED Triage Notes (Signed)
Pt noticed a rash on her chest last night. Also pt is a Dr. Ronne BinningMckenzie patient, requesting refill on narcotic medicine.

## 2018-06-16 NOTE — Discharge Instructions (Signed)
Start prednisone as directed. You can apply triamciniolone on affected area. Zyrtec for itching. Ice compress will help. Please monitor for any new exposures that could be causing symptoms. Follow up with PCP for further evaluation if symptoms not improving. If experiencing spreading redness, increased warmth, fever, follow up for reevaluation needed.

## 2018-06-16 NOTE — ED Provider Notes (Signed)
[facility]    CSN: [id number] Arrival date & time: 06/16/18  1001     History   Chief Complaint Chief Complaint  Patient presents with  . Rash  . Medication Refill    HPI Leah Barry is a 32 y.o. female.   32 year old female comes in for rash to the chest that started last night. Denies any new hygiene product. Denies obvious new exposure to the skin. Denies spreading erythema, increased warmth, fever. States feels like a bug bit her, with itching and burning sensation. She has similar feeling to the arm and foot this morning. Denies pain. Has not tried anything for the symptoms.   Patient would also like medication refill. States she was a patient of Dr Ronne Binning, and while still seeing him, had mix up with insurance. She has new PCP, and had to stop seeing them due to insurance. States was told specifically by her PCP to come here to get her refills.      Past Medical History:  Diagnosis Date  . Anxiety   . Apnea, sleep    no CPAP use, states lost machine during move to [place]  . Bipolar 1 disorder (HCC)   . Bipolar disorder (HCC)    no current med.  . Depression   . History of MRSA infection 2010  . Irritable bowel syndrome (IBS)    no current med.  . Pelvic inflammatory disease   . Scalp laceration    staples to be removed 03/13/2014  . Ulnar fracture 03/09/2014   left    Patient Active Problem List   Diagnosis Date Noted  . Dysmenorrhea 05/28/2014  . GC (gonococcus) 05/28/2014  . Chlamydia contact 05/28/2014  . Pain 04/14/2014  . UTI symptoms 04/14/2014  . Routine general medical examination at a health care facility 09/26/2013  . Candidiasis of vulva and vagina 09/26/2013  . Unspecified symptom associated with female genital organs 09/10/2013  . Abnormal uterine bleeding (AUB) 09/10/2013  . PID (pelvic inflammatory disease) 07/29/2013  . Dysuria 07/29/2013  . Screening examination for venereal disease 07/29/2013  . Chronic PID (chronic pelvic  inflammatory disease) 06/30/2013  . Excessive or frequent menstruation 06/30/2013  . Genital herpes, unspecified 06/30/2013  . Unspecified inflammatory disease of female pelvic organs and tissues 03/12/2013    Past Surgical History:  Procedure Laterality Date  . CYST EXCISION     middle of back  . ORIF ULNAR FRACTURE Left 03/16/2014   Procedure: OPEN REDUCTION INTERNAL FIXATION (ORIF) LEFT ULNAR FRACTURE;  Surgeon: Marlowe Shores, MD;  Location: [facility];  Service: Orthopedics;  Laterality: Left;  left  . OVARIAN CYST REMOVAL    . THYROID CYST EXCISION     nodule exc.  . TUBAL LIGATION      OB History    Gravida  5   Para  4   Term  4   Preterm      AB  1   Living  4     SAB      TAB      Ectopic  1   Multiple      Live Births  4            Home Medications    Prior to Admission medications   Medication Sig Start Date End Date Taking? Authorizing Provider  ARIPiprazole (ABILIFY) 5 MG tablet Take 1 tablet (5 mg total) by mouth 3 (three) times daily. 05/30/18   Geoffery Lyons, MD  cetirizine (ZYRTEC) 10 MG tablet Take 1 tablet (10 mg total) by mouth daily. 06/16/18   Cathie HoopsYu, Amy V, PA-C  hydrOXYzine (ATARAX/VISTARIL) 25 MG tablet Take 1 tablet (25 mg total) by mouth 2 (two) times daily as needed. 05/30/18   Geoffery Lyonselo, Douglas, MD  predniSONE (DELTASONE) 50 MG tablet Take 1 tablet (50 mg total) by mouth daily for 5 days. 06/16/18 06/21/18  Cathie HoopsYu, Amy V, PA-C  triamcinolone cream (KENALOG) 0.1 % Apply 1 application topically 2 (two) times daily. 06/16/18   Belinda FisherYu, Amy V, PA-C    Family History Family History  Problem Relation Age of Onset  . Heart disease Mother   . Cancer Maternal Grandmother        breast  . Cancer Paternal Grandmother        breast    Social History Social History   Tobacco Use  . Smoking status: Current Every Day Smoker    Years: 3.00    Types: Cigars  . Smokeless tobacco: Never Used  . Tobacco comment: 1-2 cig./day  Substance Use  Topics  . Alcohol use: Not Currently    Alcohol/week: 0.0 oz    Comment: occ  . Drug use: Not Currently     Allergies   Latuda [lurasidone hcl]; Sulfa antibiotics; Bee venom; Flagyl [metronidazole]; Naproxen; and Tramadol   Review of Systems Review of Systems  Reason unable to perform ROS: See HPI as above.     Physical Exam Triage Vital Signs ED Triage Vitals  Enc Vitals Group     BP 06/16/18 1021 114/75     Pulse Rate 06/16/18 1020 86     Resp 06/16/18 1020 18     Temp 06/16/18 1020 98.3 F (36.8 C)     Temp src --      SpO2 06/16/18 1020 100 %     Weight --      Height --      Head Circumference --      Peak Flow --      Pain Score --      Pain Loc --      Pain Edu? --      Excl. in GC? --    No data found.  Updated Vital Signs BP 114/75   Pulse 86   Temp 98.3 F (36.8 C)   Resp 18   SpO2 100%   Physical Exam  Constitutional: She is oriented to person, place, and time. She appears well-developed and well-nourished. No distress.  HENT:  Head: Normocephalic and atraumatic.  Eyes: Pupils are equal, round, and reactive to light. Conjunctivae are normal.  Neurological: She is alert and oriented to person, place, and time.  Skin: Skin is warm and dry. She is not diaphoretic.  Maculopapular rash to the chest with surrounding erythema. No increased warmth, no tenderness to palpation.   hyperpigmented lesions to the arm and foot that is more consistent with healed wound/scar     UC Treatments / Results  Labs (all labs ordered are listed, but only abnormal results are displayed) Labs Reviewed - No data to display  EKG None  Radiology No results found.  Procedures Procedures (including critical care time)  Medications Ordered in UC Medications - No data to display  Initial Impression / Assessment and Plan / UC Course  I have reviewed the triage vital signs and the nursing notes.  Pertinent labs & imaging results that were available during my  care of the patient were reviewed by me and considered  in my medical decision making (see chart for details).    Will treat patient for contact dermatitis. Start prednisone as directed. Triamcinolone as directed. Zyrtec for itching. Return precautions given.  PMP aware shows patient had had multiple refills of controlled substance, last refill 03/27/2018. She is taking clonazepam, ambien, and has had refills of percocet and norco at different times. Explained to patient, unable to refill these controlled substance. Patient stated would like to talk to supervising physician as she was told specifically that she will get her medicines refilled if she came here. Dr Delton See notified and spoke with patient.  Final Clinical Impressions(s) / UC Diagnoses   Final diagnoses:  Rash    ED Prescriptions    Medication Sig Dispense Auth. Provider   predniSONE (DELTASONE) 50 MG tablet Take 1 tablet (50 mg total) by mouth daily for 5 days. 5 tablet Yu, Amy V, PA-C   cetirizine (ZYRTEC) 10 MG tablet Take 1 tablet (10 mg total) by mouth daily. 15 tablet Yu, Amy V, PA-C   triamcinolone cream (KENALOG) 0.1 % Apply 1 application topically 2 (two) times daily. 30 g Threasa Alpha, [place] 06/16/18 1058

## 2018-07-04 ENCOUNTER — Inpatient Hospital Stay (HOSPITAL_COMMUNITY)
Admission: AD | Admit: 2018-07-04 | Discharge: 2018-07-04 | Disposition: A | Discharge status: Home / Self Care | Payer: Medicaid Other | Source: Ambulatory Visit | Attending: Obstetrics and Gynecology | Admitting: Obstetrics and Gynecology

## 2018-07-04 NOTE — Progress Notes (Signed)
Left after registration per front desk.

## 2018-07-08 ENCOUNTER — Emergency Department (HOSPITAL_COMMUNITY)
Admission: EM | Admit: 2018-07-08 | Discharge: 2018-07-08 | Disposition: A | Discharge status: Home / Self Care | Payer: Medicaid Other | Attending: Emergency Medicine | Admitting: Emergency Medicine

## 2018-07-08 ENCOUNTER — Other Ambulatory Visit: Payer: Self-pay

## 2018-07-08 ENCOUNTER — Emergency Department (HOSPITAL_COMMUNITY): Payer: Medicaid Other

## 2018-07-08 ENCOUNTER — Encounter (HOSPITAL_COMMUNITY): Payer: Self-pay

## 2018-07-08 DIAGNOSIS — N83291 Other ovarian cyst, right side: Secondary | ICD-10-CM | POA: Insufficient documentation

## 2018-07-08 DIAGNOSIS — F17290 Nicotine dependence, other tobacco product, uncomplicated: Secondary | ICD-10-CM | POA: Diagnosis not present

## 2018-07-08 DIAGNOSIS — N939 Abnormal uterine and vaginal bleeding, unspecified: Secondary | ICD-10-CM | POA: Diagnosis not present

## 2018-07-08 DIAGNOSIS — R1030 Lower abdominal pain, unspecified: Secondary | ICD-10-CM

## 2018-07-08 DIAGNOSIS — N83201 Unspecified ovarian cyst, right side: Secondary | ICD-10-CM

## 2018-07-08 DIAGNOSIS — Z79899 Other long term (current) drug therapy: Secondary | ICD-10-CM | POA: Insufficient documentation

## 2018-07-08 LAB — I-STAT BETA HCG BLOOD, ED (MC, WL, AP ONLY): I-stat hCG, quantitative: <5 m[IU]/mL (ref <5)

## 2018-07-08 LAB — CBC WITH DIFFERENTIAL/PLATELET
Abs Immature Granulocytes: 0.1 10*3/uL (ref 0.0–0.1)
Basophils Absolute: 0.1 10*3/uL (ref 0.0–0.1)
Basophils Relative: 1 %
Eosinophils Absolute: 0.3 10*3/uL (ref 0.0–0.7)
Eosinophils Relative: 3 %
HCT: 42.2 % (ref 36.0–46.0)
Hemoglobin: 14.4 g/dL (ref 12.0–15.0)
Immature Granulocytes: 1 %
Lymphocytes Relative: 24 %
Lymphs Abs: 2.5 10*3/uL (ref 0.7–4.0)
MCH: 27.4 pg (ref 26.0–34.0)
MCHC: 34.1 g/dL (ref 30.0–36.0)
MCV: 80.4 fL (ref 78.0–100.0)
Monocytes Absolute: 0.8 10*3/uL (ref 0.1–1.0)
Monocytes Relative: 7 %
Neutro Abs: 6.9 10*3/uL (ref 1.7–7.7)
Neutrophils Relative %: 64 %
Platelets: 266 10*3/uL (ref 150–400)
RBC: 5.25 MIL/uL — ABNORMAL HIGH (ref 3.87–5.11)
RDW: 14.6 % (ref 11.5–15.5)
WBC: 10.7 10*3/uL — ABNORMAL HIGH (ref 4.0–10.5)

## 2018-07-08 LAB — URINALYSIS, ROUTINE W REFLEX MICROSCOPIC
Bacteria, UA: NONE SEEN
Bilirubin Urine: NEGATIVE
Glucose, UA: NEGATIVE mg/dL
Ketones, ur: NEGATIVE mg/dL
Leukocytes, UA: NEGATIVE
Nitrite: NEGATIVE
Protein, ur: NEGATIVE mg/dL
RBC / HPF: >50 RBC/hpf — ABNORMAL HIGH (ref 0–5)
Specific Gravity, Urine: 1.024 (ref 1.005–1.030)
pH: 5 (ref 5.0–8.0)

## 2018-07-08 LAB — COMPREHENSIVE METABOLIC PANEL
ALT: 12 U/L (ref 0–44)
AST: 14 U/L — ABNORMAL LOW (ref 15–41)
Albumin: 3.1 g/dL — ABNORMAL LOW (ref 3.5–5.0)
Alkaline Phosphatase: 55 U/L (ref 38–126)
Anion gap: 9 (ref 5–15)
BUN: 12 mg/dL (ref 6–20)
CO2: 24 mmol/L (ref 22–32)
Calcium: 8.3 mg/dL — ABNORMAL LOW (ref 8.9–10.3)
Chloride: 106 mmol/L (ref 98–111)
Creatinine, Ser: 0.69 mg/dL (ref 0.44–1.00)
GFR calc Af Amer: >60 mL/min (ref >60)
GFR calc non Af Amer: >60 mL/min (ref >60)
Glucose, Bld: 121 mg/dL — ABNORMAL HIGH (ref 70–99)
Potassium: 4 mmol/L (ref 3.5–5.1)
Sodium: 139 mmol/L (ref 135–145)
Total Bilirubin: 0.5 mg/dL (ref 0.3–1.2)
Total Protein: 5.7 g/dL — ABNORMAL LOW (ref 6.5–8.1)

## 2018-07-08 LAB — WET PREP, GENITAL
Clue Cells Wet Prep HPF POC: NONE SEEN
Sperm: NONE SEEN
Trich, Wet Prep: NONE SEEN
Yeast Wet Prep HPF POC: NONE SEEN

## 2018-07-08 LAB — LIPASE, BLOOD: Lipase: 31 U/L (ref 11–51)

## 2018-07-08 MED ORDER — IBUPROFEN 600 MG PO TABS: 600.0000 mg | ORAL_TABLET | Freq: Four times a day (QID) | ORAL | 0 refills | Status: DC | PRN

## 2018-07-08 MED ORDER — ACETAMINOPHEN 500 MG PO TABS: 500.0000 mg | ORAL_TABLET | Freq: Four times a day (QID) | ORAL | 0 refills | Status: DC | PRN

## 2018-07-08 MED ORDER — ONDANSETRON HCL 4 MG/2ML IJ SOLN
4.0000 mg | Freq: Once | INTRAMUSCULAR | Status: AC
  Administered 2018-07-08: 4 mg via INTRAVENOUS
  Filled 2018-07-08: qty 2

## 2018-07-08 MED ORDER — KETOROLAC TROMETHAMINE 30 MG/ML IJ SOLN
30.0000 mg | Freq: Once | INTRAMUSCULAR | Status: AC
  Administered 2018-07-08: 30 mg via INTRAVENOUS
  Filled 2018-07-08: qty 1

## 2018-07-08 MED ORDER — ACETAMINOPHEN 500 MG PO TABS
1000.0000 mg | ORAL_TABLET | Freq: Once | ORAL | Status: AC
  Administered 2018-07-08: 1000 mg via ORAL
  Filled 2018-07-08: qty 2

## 2018-07-08 MED ORDER — SODIUM CHLORIDE 0.9 % IV BOLUS
1000.0000 mL | Freq: Once | INTRAVENOUS | Status: AC
  Administered 2018-07-08: 1000 mL via INTRAVENOUS

## 2018-07-08 NOTE — ED Notes (Signed)
Patient in US.

## 2018-07-08 NOTE — ED Notes (Signed)
Patient ambulated to the bathroom without assistance/pain.

## 2018-07-08 NOTE — ED Provider Notes (Signed)
Patient placed in Quick Look pathway, seen and evaluated   Chief Complaint: Abdominal pain, vaginal bleedin  HPI:   32 y.o. F who presents for evaluation of persistent lower abdominal pain and vaginal bleeding x 3 weeks. Reports symptoms are intermittent. She was seen at an ED a few weeks ago and was told she had ovarian cysts. She has not followed up with OB/GYN. She states that she does not know how many pads she goes through a day when she has the bleeding. Also feels that her abdomen is swollen. She is currently sexually active. She does not use protection. She denies any fevers, vomiting.   ROS: Abdominal pain  Physical Exam:   Gen: No distress  Neuro: Awake and Alert  Skin: Warm    Focused Exam: TTP to LLQ abdomen. Abdomen is soft, non-distended.    Initiation of care has begun. The patient has been counseled on the process, plan, and necessity for staying for the completion/evaluation, and the remainder of the medical screening examination   Rosana HoesLayden, Shyanna Klingel A, PA-C 07/08/18 1331    Tegeler, Canary Brimhristopher J, MD 07/08/18 [phone]

## 2018-07-08 NOTE — ED Triage Notes (Signed)
Pt reports abdominal pain and distention. She also reports some vaginal bleeding. She states she has had nausea, thirst, and fatigue.

## 2018-07-08 NOTE — Discharge Instructions (Signed)
Labs are overall reassuring, your hemoglobin looks good despite bleeding.  Ultrasound shows 3.6 cm cyst over the right ovary.  You may use ibuprofen and Tylenol as we discussed for pain.  Please follow-up with OB/GYN for further evaluation of cyst and abnormal bleeding.  Return for significantly worsened or changing pain, fevers, lightheadedness, chest pain or shortness of breath or any other new or concerning symptoms.

## 2018-07-08 NOTE — ED Notes (Addendum)
Patient verbalizes understanding of medications and discharge instructions. No further questions at this time. VSS and patient ambulatory at discharge.   

## 2018-07-08 NOTE — ED Provider Notes (Signed)
[facility] EMERGENCY DEPARTMENT Provider Note   CSN: [id number] Arrival date & time: 07/08/18  1314     History   Chief Complaint Chief Complaint  Patient presents with  . Abdominal Pain    HPI Leah Barry is a 32 y.o. female.  Leah Barry is a 32 y.o. Female with history of PID, IBS, bipolar disorder and anxiety, who presents to the emergency department for evaluation of lower abdominal pain and vaginal bleeding.  Patient reports she has had persisting lower abdominal pain, worse on the left than the right for the last 3 weeks with associated daily vaginal bleeding.  She describes the bleeding as intermittent and that it comes out in "spurts" with blood clots.  Bleeding has not slowed down over the past 3 weeks and pain has been getting worse on the left side over the past 2 days.  Patient reports pain is intermittent but seems to be more frequent recently.  She has been taking ibuprofen without improvement has not tried anything else to treat the symptoms.  She was seen in the ED few weeks ago and was told she had ovarian cysts, and has not been able to follow-up with OB/GYN yet.  She is unsure how many pads or tampons she is using throughout the day because the bleeding seems to come in spurts.  She feels that her abdomen has become increasingly distended with worsening pain.  She is currently sexually active and reports vaginal odor but no discharge.  She has had some dysuria and urinary frequency, but denies any fevers.  Has been nauseated but has not had any vomiting.  No constipation, diarrhea, melena or hematochezia.  History of prior tubal ligation, no other abdominal surgeries.  She reports associated increasing fatigue and decreased appetite, no lightheadedness or syncope, no chest pain, shortness of breath or palpitations.   The history is provided by the patient.  Abdominal Pain   This is a recurrent problem. The current episode started more than 1  week ago. The problem occurs hourly. The problem has been gradually worsening. The pain is associated with an unknown factor. The pain is located in the LLQ, RLQ and suprapubic region. The quality of the pain is cramping and sharp. Associated symptoms include nausea, dysuria and frequency. Pertinent negatives include fever, diarrhea, vomiting, constipation, arthralgias and myalgias. Nothing relieves the symptoms. Past workup includes ultrasound.    Past Medical History:  Diagnosis Date  . Anxiety   . Apnea, sleep    no CPAP use, states lost machine during move to [place]  . Bipolar 1 disorder (HCC)   . Bipolar disorder (HCC)    no current med.  . Depression   . History of MRSA infection 2010  . Irritable bowel syndrome (IBS)    no current med.  . Pelvic inflammatory disease   . Scalp laceration    staples to be removed 03/13/2014  . Ulnar fracture 03/09/2014   left    Patient Active Problem List   Diagnosis Date Noted  . Dysmenorrhea 05/28/2014  . GC (gonococcus) 05/28/2014  . Chlamydia contact 05/28/2014  . Pain 04/14/2014  . UTI symptoms 04/14/2014  . Routine general medical examination at a health care facility 09/26/2013  . Candidiasis of vulva and vagina 09/26/2013  . Unspecified symptom associated with female genital organs 09/10/2013  . Abnormal uterine bleeding (AUB) 09/10/2013  . PID (pelvic inflammatory disease) 07/29/2013  . Dysuria 07/29/2013  . Screening examination for venereal disease 07/29/2013  .  Chronic PID (chronic pelvic inflammatory disease) 06/30/2013  . Excessive or frequent menstruation 06/30/2013  . Genital herpes, unspecified 06/30/2013  . Unspecified inflammatory disease of female pelvic organs and tissues 03/12/2013    Past Surgical History:  Procedure Laterality Date  . CYST EXCISION     middle of back  . ORIF ULNAR FRACTURE Left 03/16/2014   Procedure: OPEN REDUCTION INTERNAL FIXATION (ORIF) LEFT ULNAR FRACTURE;  Surgeon: Marlowe Shores, MD;   Location: [facility];  Service: Orthopedics;  Laterality: Left;  left  . OVARIAN CYST REMOVAL    . THYROID CYST EXCISION     nodule exc.  . TUBAL LIGATION       OB History    Gravida  5   Para  4   Term  4   Preterm      AB  1   Living  4     SAB      TAB      Ectopic  1   Multiple      Live Births  4            Home Medications    Prior to Admission medications   Medication Sig Start Date End Date Taking? Authorizing Provider  ARIPiprazole (ABILIFY) 5 MG tablet Take 1 tablet (5 mg total) by mouth 3 (three) times daily. 05/30/18  Yes Delo, Riley Lam, MD  cetirizine (ZYRTEC) 10 MG tablet Take 1 tablet (10 mg total) by mouth daily. 06/16/18  Yes Yu, Amy V, PA-C  gabapentin (NEURONTIN) 400 MG capsule Take 400 mg by mouth 3 (three) times daily. 07/07/18  Yes [provider]  hydrOXYzine (VISTARIL) 50 MG capsule Take 50 mg by mouth 2 (two) times daily. 05/30/18  Yes [provider]  ibuprofen (ADVIL,MOTRIN) 200 MG tablet Take 800 mg by mouth as needed.   Yes [provider]  hydrOXYzine (ATARAX/VISTARIL) 25 MG tablet Take 1 tablet (25 mg total) by mouth 2 (two) times daily as needed. Patient not taking: Reported on 07/08/2018 05/30/18   Geoffery Lyons, MD  triamcinolone cream (KENALOG) 0.1 % Apply 1 application topically 2 (two) times daily. Patient not taking: Reported on 07/08/2018 06/16/18   Lurline Idol    Family History Family History  Problem Relation Age of Onset  . Heart disease Mother   . Cancer Maternal Grandmother        breast  . Cancer Paternal Grandmother        breast    Social History Social History   Tobacco Use  . Smoking status: Current Every Day Smoker    Years: 3.00    Types: Cigars  . Smokeless tobacco: Never Used  . Tobacco comment: 1-2 cig./day  Substance Use Topics  . Alcohol use: Not Currently    Alcohol/week: 0.0 standard drinks    Comment: occ  . Drug use: Not Currently     Allergies    Latuda [lurasidone hcl]; Sulfa antibiotics; Bee venom; Flagyl [metronidazole]; Naproxen; and Tramadol   Review of Systems Review of Systems  Constitutional: Positive for fatigue. Negative for chills and fever.  HENT: Negative.   Eyes: Negative for visual disturbance.  Respiratory: Negative for cough and shortness of breath.   Cardiovascular: Negative for palpitations.  Gastrointestinal: Positive for abdominal pain and nausea. Negative for blood in stool, constipation, diarrhea and vomiting.  Genitourinary: Positive for dysuria, frequency, pelvic pain and vaginal bleeding. Negative for flank pain, vaginal discharge and vaginal pain.  Musculoskeletal: Negative for  arthralgias and myalgias.  Skin: Negative for color change and pallor.  Neurological: Negative for dizziness, syncope and light-headedness.  All other systems reviewed and are negative.    Physical Exam Updated Vital Signs BP 131/80 (BP Location: Right Arm)   Pulse 98   Temp 98.4 F (36.9 C) (Oral)   Resp 18   LMP 07/08/2018 (Within Days)   SpO2 99%   Physical Exam  Constitutional: She is oriented to person, place, and time. She appears well-developed and well-nourished. She does not appear ill. No distress.  HENT:  Head: Normocephalic and atraumatic.  Mouth/Throat: Oropharynx is clear and moist.  Eyes: Pupils are equal, round, and reactive to light. EOM are normal. Right eye exhibits no discharge. Left eye exhibits no discharge.  Cardiovascular: Normal rate, regular rhythm, normal heart sounds and intact distal pulses.  Pulmonary/Chest: Effort normal and breath sounds normal. No respiratory distress.  Respirations equal and unlabored, patient able to speak in full sentences, lungs clear to auscultation bilaterally  Abdominal: Soft. Normal appearance and bowel sounds are normal. She exhibits no distension. There is tenderness in the right lower quadrant and suprapubic area. There is guarding. There is no rigidity, no  rebound and no CVA tenderness.  Abdomen soft, nondistended, bowel sounds present throughout, there is tenderness across the lower abdomen, most prevalent in the left lower quadrant without guarding, no rebound tenderness.  No CVA tenderness bilaterally.  Genitourinary:  Genitourinary Comments: Chaperone present during pelvic exam. No external genital lesions noted. Speculum exam reveals moderate amount of blood without clots present in the vaginal vault, there is no discharge present. Bimanual exam is without focal uterine or adnexal tenderness, no cervical motion tenderness  Neurological: She is alert and oriented to person, place, and time. Coordination normal.  Skin: Skin is warm and dry. Capillary refill takes less than 2 seconds. She is not diaphoretic.  Psychiatric: She has a normal mood and affect. Her behavior is normal.  Nursing note and vitals reviewed.    ED Treatments / Results  Labs (all labs ordered are listed, but only abnormal results are displayed) Labs Reviewed  WET PREP, GENITAL - Abnormal; Notable for the following components:      Result Value   WBC, Wet Prep HPF POC MANY (*)    All other components within normal limits  COMPREHENSIVE METABOLIC PANEL - Abnormal; Notable for the following components:   Glucose, Bld 121 (*)    Calcium 8.3 (*)    Total Protein 5.7 (*)    Albumin 3.1 (*)    AST 14 (*)    All other components within normal limits  CBC WITH DIFFERENTIAL/PLATELET - Abnormal; Notable for the following components:   WBC 10.7 (*)    RBC 5.25 (*)    All other components within normal limits  URINALYSIS, ROUTINE W REFLEX MICROSCOPIC - Abnormal; Notable for the following components:   APPearance HAZY (*)    Hgb urine dipstick LARGE (*)    RBC / HPF >50 (*)    All other components within normal limits  LIPASE, BLOOD  HIV ANTIBODY (ROUTINE TESTING)  RPR  I-STAT BETA HCG BLOOD, ED ([facility], [facility], [facility] ONLY)  GC/CHLAMYDIA PROBE AMP ([place]) NOT AT [facility]     EKG None  Radiology US Transvaginal Non-ob  Result Date: 07/08/2018 CLINICAL DATA:  Pain, vaginal bleeding EXAM: TRANSABDOMINAL AND TRANSVAGINAL ULTRASOUND OF PELVIS DOPPLER ULTRASOUND OF OVARIES TECHNIQUE: Both transabdominal and transvaginal ultrasound examinations of the pelvis were performed. Transabdominal technique was performed  for global imaging of the pelvis including uterus, ovaries, adnexal regions, and pelvic cul-de-sac. It was necessary to proceed with endovaginal exam following the transabdominal exam to visualize the ovaries. Color and duplex Doppler ultrasound was utilized to evaluate blood flow to the ovaries. COMPARISON:  06/11/2018 FINDINGS: Uterus Measurements: 9.1 x 4.9 x 6.2 cm. No fibroids or other mass visualized. Endometrium Thickness: 7 mm in thickness.  No focal abnormality visualized. Right ovary Measurements: 4.1 x 3.5 x 4.0 cm. Cystic structure within the right ovary measures up to 3.6 cm, likely functional cyst. Left ovary Measurements: 1.9 x 1.9 x 1.5 cm. Normal appearance/no adnexal mass. Pulsed Doppler evaluation of both ovaries demonstrates normal low-resistance arterial and venous waveforms. Other findings No abnormal free fluid. IMPRESSION: 3.6 cm benign-appearing functional cyst in the right ovary. No evidence of torsion. No acute findings. Electronically Signed   By: Charlett NoseKevin  Dover M.D.   On: 07/08/2018 19:31   [name] Pelvis Complete  Result Date: 07/08/2018 CLINICAL DATA:  Pain, vaginal bleeding EXAM: TRANSABDOMINAL AND TRANSVAGINAL ULTRASOUND OF PELVIS DOPPLER ULTRASOUND OF OVARIES TECHNIQUE: Both transabdominal and transvaginal ultrasound examinations of the pelvis were performed. Transabdominal technique was performed for global imaging of the pelvis including uterus, ovaries, adnexal regions, and pelvic cul-de-sac. It was necessary to proceed with endovaginal exam following the transabdominal exam to visualize the ovaries. Color and duplex Doppler ultrasound was  utilized to evaluate blood flow to the ovaries. COMPARISON:  06/11/2018 FINDINGS: Uterus Measurements: 9.1 x 4.9 x 6.2 cm. No fibroids or other mass visualized. Endometrium Thickness: 7 mm in thickness.  No focal abnormality visualized. Right ovary Measurements: 4.1 x 3.5 x 4.0 cm. Cystic structure within the right ovary measures up to 3.6 cm, likely functional cyst. Left ovary Measurements: 1.9 x 1.9 x 1.5 cm. Normal appearance/no adnexal mass. Pulsed Doppler evaluation of both ovaries demonstrates normal low-resistance arterial and venous waveforms. Other findings No abnormal free fluid. IMPRESSION: 3.6 cm benign-appearing functional cyst in the right ovary. No evidence of torsion. No acute findings. Electronically Signed   By: Charlett NoseKevin  Dover M.D.   On: 07/08/2018 19:31   [name] Art/ven Flow Abd Pelv Doppler  Result Date: 07/08/2018 CLINICAL DATA:  Pain, vaginal bleeding EXAM: TRANSABDOMINAL AND TRANSVAGINAL ULTRASOUND OF PELVIS DOPPLER ULTRASOUND OF OVARIES TECHNIQUE: Both transabdominal and transvaginal ultrasound examinations of the pelvis were performed. Transabdominal technique was performed for global imaging of the pelvis including uterus, ovaries, adnexal regions, and pelvic cul-de-sac. It was necessary to proceed with endovaginal exam following the transabdominal exam to visualize the ovaries. Color and duplex Doppler ultrasound was utilized to evaluate blood flow to the ovaries. COMPARISON:  06/11/2018 FINDINGS: Uterus Measurements: 9.1 x 4.9 x 6.2 cm. No fibroids or other mass visualized. Endometrium Thickness: 7 mm in thickness.  No focal abnormality visualized. Right ovary Measurements: 4.1 x 3.5 x 4.0 cm. Cystic structure within the right ovary measures up to 3.6 cm, likely functional cyst. Left ovary Measurements: 1.9 x 1.9 x 1.5 cm. Normal appearance/no adnexal mass. Pulsed Doppler evaluation of both ovaries demonstrates normal low-resistance arterial and venous waveforms. Other findings No abnormal  free fluid. IMPRESSION: 3.6 cm benign-appearing functional cyst in the right ovary. No evidence of torsion. No acute findings. Electronically Signed   By: Charlett NoseKevin  Dover M.D.   On: 07/08/2018 19:31    Procedures Procedures (including critical care time)  Medications Ordered in ED Medications  sodium chloride 0.9 % bolus 1,000 mL (0 mLs Intravenous Stopped 07/08/18 1727)  ondansetron (ZOFRAN) injection 4  mg (4 mg Intravenous Given 07/08/18 1551)  ketorolac (TORADOL) 30 MG/ML injection 30 mg (30 mg Intravenous Given 07/08/18 1551)  acetaminophen (TYLENOL) tablet 1,000 mg (1,000 mg Oral Given 07/08/18 2022)     Initial Impression / Assessment and Plan / ED Course  I have reviewed the triage vital signs and the nursing notes.  Pertinent labs & imaging results that were available during my care of the patient were reviewed by me and considered in my medical decision making (see chart for details).  Patient presents to the emergency department for evaluation of lower abdominal pain and vaginal bleeding, present for the last 3 weeks, was seen previously and found to have ovarian cyst, pain worsening over the past few days, no vomiting but a few episodes of nausea.  No fevers or chills.  No vaginal discharge, is currently sexually active.  History of prior tubal ligation.  Abdominal exam with diffuse lower abdominal tenderness without guarding.  Exam reveals moderate amount of blood, no obvious discharge, exam not concerning for PID.  Abdominal labs and STD testing collected.  Will also get ultrasound to rule out ovarian torsion.  IV fluids, Zofran and Toradol for pain.  Labs reassuring without significant leukocytosis and stable hemoglobin of 14.4 despite bleeding.  No acute electrolyte derangements and normal renal function, normal LFTs and lipase.  Urinalysis with blood present but no signs of infection.  Wet prep with many WBCs, but without any discharge present I feel this is more likely in relation to  the amount of blood present on exam.  STD testing is pending, patient had negative GC, and chlamydia 3 weeks ago during initial visit.  Ultrasound shows 3.6 cm ovarian cyst, no evidence of torsion and otherwise unremarkable.  Discussed reassuring results with patient, pain has improved and patient is tolerating p.o., and ambulatory in the department comfortably.  Stressed the importance of follow-up with OB/GYN.   At this time there does not appear to be any evidence of an acute emergency medical condition and the patient appears stable for discharge with appropriate outpatient follow up.Diagnosis was discussed with patient who verbalizes understanding and is agreeable to discharge.  Final Clinical Impressions(s) / ED Diagnoses   Final diagnoses:  Lower abdominal pain  Right ovarian cyst  Vaginal bleeding    ED Discharge Orders         Ordered    ibuprofen (ADVIL,MOTRIN) 600 MG tablet  Every 6 hours PRN     07/08/18 2034    acetaminophen (TYLENOL) 500 MG tablet  Every 6 hours PRN     07/08/18 2034           Dartha Lodge, PA-C 07/09/18 0141    Linwood Dibbles, MD 07/09/18 [phone]

## 2018-07-09 LAB — RPR: RPR Ser Ql: NONREACTIVE

## 2018-07-09 LAB — HIV ANTIBODY (ROUTINE TESTING W REFLEX): HIV Screen 4th Generation wRfx: NONREACTIVE

## 2018-07-09 LAB — GC/CHLAMYDIA PROBE AMP (~~LOC~~) NOT AT ARMC
Chlamydia: POSITIVE — AB
Neisseria Gonorrhea: NEGATIVE

## 2018-08-06 ENCOUNTER — Emergency Department (HOSPITAL_COMMUNITY): Payer: Medicaid Other

## 2018-08-06 ENCOUNTER — Other Ambulatory Visit: Payer: Self-pay

## 2018-08-06 ENCOUNTER — Emergency Department (HOSPITAL_COMMUNITY)
Admission: EM | Admit: 2018-08-06 | Discharge: 2018-08-06 | Disposition: A | Discharge status: Home / Self Care | Payer: Medicaid Other | Attending: Emergency Medicine | Admitting: Emergency Medicine

## 2018-08-06 ENCOUNTER — Encounter (HOSPITAL_COMMUNITY): Payer: Self-pay

## 2018-08-06 DIAGNOSIS — F17210 Nicotine dependence, cigarettes, uncomplicated: Secondary | ICD-10-CM | POA: Insufficient documentation

## 2018-08-06 DIAGNOSIS — N739 Female pelvic inflammatory disease, unspecified: Secondary | ICD-10-CM | POA: Insufficient documentation

## 2018-08-06 DIAGNOSIS — B9689 Other specified bacterial agents as the cause of diseases classified elsewhere: Secondary | ICD-10-CM

## 2018-08-06 DIAGNOSIS — N760 Acute vaginitis: Secondary | ICD-10-CM | POA: Insufficient documentation

## 2018-08-06 DIAGNOSIS — R1032 Left lower quadrant pain: Secondary | ICD-10-CM | POA: Diagnosis present

## 2018-08-06 LAB — CBC
HCT: 38.3 % (ref 36.0–46.0)
Hemoglobin: 12.8 g/dL (ref 12.0–15.0)
MCH: 27.7 pg (ref 26.0–34.0)
MCHC: 33.4 g/dL (ref 30.0–36.0)
MCV: 82.9 fL (ref 78.0–100.0)
Platelets: 319 10*3/uL (ref 150–400)
RBC: 4.62 MIL/uL (ref 3.87–5.11)
RDW: 13.4 % (ref 11.5–15.5)
WBC: 7.4 10*3/uL (ref 4.0–10.5)

## 2018-08-06 LAB — URINALYSIS, ROUTINE W REFLEX MICROSCOPIC
Bilirubin Urine: NEGATIVE
Glucose, UA: NEGATIVE mg/dL
Ketones, ur: NEGATIVE mg/dL
Leukocytes, UA: NEGATIVE
Nitrite: NEGATIVE
Protein, ur: NEGATIVE mg/dL
Specific Gravity, Urine: 1.027 (ref 1.005–1.030)
pH: 5 (ref 5.0–8.0)

## 2018-08-06 LAB — LIPASE, BLOOD: Lipase: 32 U/L (ref 11–51)

## 2018-08-06 LAB — COMPREHENSIVE METABOLIC PANEL
ALT: 13 U/L (ref 0–44)
AST: 17 U/L (ref 15–41)
Albumin: 3.4 g/dL — ABNORMAL LOW (ref 3.5–5.0)
Alkaline Phosphatase: 63 U/L (ref 38–126)
Anion gap: 9 (ref 5–15)
BUN: 9 mg/dL (ref 6–20)
CO2: 26 mmol/L (ref 22–32)
Calcium: 8.8 mg/dL — ABNORMAL LOW (ref 8.9–10.3)
Chloride: 108 mmol/L (ref 98–111)
Creatinine, Ser: 0.68 mg/dL (ref 0.44–1.00)
GFR calc Af Amer: >60 mL/min (ref >60)
GFR calc non Af Amer: >60 mL/min (ref >60)
Glucose, Bld: 123 mg/dL — ABNORMAL HIGH (ref 70–99)
Potassium: 3.5 mmol/L (ref 3.5–5.1)
Sodium: 143 mmol/L (ref 135–145)
Total Bilirubin: 0.1 mg/dL — ABNORMAL LOW (ref 0.3–1.2)
Total Protein: 6.3 g/dL — ABNORMAL LOW (ref 6.5–8.1)

## 2018-08-06 LAB — RAPID HIV SCREEN (HIV 1/2 AB+AG)
HIV 1/2 Antibodies: NONREACTIVE
HIV-1 P24 Antigen - HIV24: NONREACTIVE

## 2018-08-06 LAB — WET PREP, GENITAL
Sperm: NONE SEEN
Trich, Wet Prep: NONE SEEN
Yeast Wet Prep HPF POC: NONE SEEN

## 2018-08-06 LAB — I-STAT BETA HCG BLOOD, ED (MC, WL, AP ONLY): I-stat hCG, quantitative: <5 m[IU]/mL (ref <5)

## 2018-08-06 MED ORDER — LIDOCAINE HCL (PF) 1 % IJ SOLN
INTRAMUSCULAR | Status: AC
  Administered 2018-08-06: 17:00:00
  Filled 2018-08-06: qty 5

## 2018-08-06 MED ORDER — KETOROLAC TROMETHAMINE 30 MG/ML IJ SOLN
15.0000 mg | Freq: Once | INTRAMUSCULAR | Status: AC
  Administered 2018-08-06: 15 mg via INTRAMUSCULAR
  Filled 2018-08-06: qty 1

## 2018-08-06 MED ORDER — ONDANSETRON 4 MG PO TBDP
4.0000 mg | ORAL_TABLET | Freq: Once | ORAL | Status: AC
  Administered 2018-08-06: 4 mg via ORAL
  Filled 2018-08-06: qty 1

## 2018-08-06 MED ORDER — OXYCODONE-ACETAMINOPHEN 5-325 MG PO TABS
1.0000 | ORAL_TABLET | Freq: Once | ORAL | Status: AC
  Administered 2018-08-06: 1 via ORAL
  Filled 2018-08-06: qty 1

## 2018-08-06 MED ORDER — CEFTRIAXONE SODIUM 250 MG IJ SOLR
250.0000 mg | Freq: Once | INTRAMUSCULAR | Status: AC
  Administered 2018-08-06: 250 mg via INTRAMUSCULAR
  Filled 2018-08-06: qty 250

## 2018-08-06 MED ORDER — PROMETHAZINE HCL 25 MG PO TABS
12.5000 mg | ORAL_TABLET | Freq: Once | ORAL | Status: AC
  Administered 2018-08-06: 12.5 mg via ORAL
  Filled 2018-08-06: qty 1

## 2018-08-06 MED ORDER — AZITHROMYCIN 250 MG PO TABS
1000.0000 mg | ORAL_TABLET | Freq: Once | ORAL | Status: AC
  Administered 2018-08-06: 1000 mg via ORAL
  Filled 2018-08-06: qty 4

## 2018-08-06 MED ORDER — METRONIDAZOLE 500 MG PO TABS
2000.0000 mg | ORAL_TABLET | Freq: Once | ORAL | Status: AC
  Administered 2018-08-06: 2000 mg via ORAL
  Filled 2018-08-06: qty 4

## 2018-08-06 MED ORDER — DOXYCYCLINE HYCLATE 100 MG PO CAPS: 100.0000 mg | ORAL_CAPSULE | Freq: Two times a day (BID) | ORAL | 0 refills | Status: AC

## 2018-08-06 NOTE — ED Triage Notes (Signed)
Pt states she has been having sharp abdominal pain and vaginal bleeding for several weeks. She states she has began to feel fatigued. Seen here last month for same.

## 2018-08-06 NOTE — Discharge Instructions (Addendum)
You were given a prescription for antibiotics. Please take the antibiotic prescription fully.   A culture was sent of your urine today to determine if there is any bacterial growth. If the results of the culture are positive and you require an antibiotic or a change of your prescribed antibiotic you will be contacted by the hospital. If the results are negative you will not be contacted.  You have been tested for HIV, syphilis, chlamydia and gonorrhea.  These results will be available in approximately 3 days and you will be contacted by the hospital if the results are positive. Avoid sexual contact until you are aware of the results, and please inform all sexual partners if you test positive for any of these diseases.  [facility]. Please follow up with your primary care provider within 5-7 days for re-evaluation of your symptoms. If you do not have a primary care provider, information for a healthcare clinic has been provided for you to make arrangements for follow up care. Please return to the emergency department for any new or worsening symptoms.

## 2018-08-06 NOTE — ED Provider Notes (Signed)
[facility] EM[facility]ENCY [facility] Provider Note   CSN: [id number] Arrival date & time: 08/06/18  1030     History   Chief Complaint Chief Complaint  Patient presents with  . Abdominal Pain  . Vaginal Bleeding    HPI Leah Barry is a 32 y.o. female.  HPI   Patient is a 32 year old female with a history of anxiety, bipolar 1 disorder, depression, IBS, PID, who presents the emergency department today for evaluation of left lower quadrant abdominal pain that she states has been present intermittently for the last several weeks but seem to worsen today.  Pain is constant and severe in nature.  Has tried taking over-the-counter medications with no significant relief.  Also reports nausea, dysuria, frequency and vaginal bleeding.  States she has had vaginal bleeding on and off for the last 3 weeks and intermittently has clots and "pinkish blood".  She is not sure when her last menses was is her menstrual cycles are irregular.  She denies any known history of ovarian cysts.  No fevers or chills.  No diarrhea, constipation.  Reports that she thinks she has an STD as she has an odor and recently had unprotected intercourse.  She is unsure if she has had abnormal vaginal discharge due to the bleeding.  Past Medical History:  Diagnosis Date  . Anxiety   . Apnea, sleep    no CPAP use, states lost machine during move to [place]  . Bipolar 1 disorder (HCC)   . Bipolar disorder (HCC)    no current med.  . Depression   . History of MRSA infection 2010  . Irritable bowel syndrome (IBS)    no current med.  . Pelvic inflammatory disease   . Scalp laceration    staples to be removed 03/13/2014  . Ulnar fracture 03/09/2014   left    Patient Active Problem List   Diagnosis Date Noted  . Dysmenorrhea 05/28/2014  . GC (gonococcus) 05/28/2014  . Chlamydia contact 05/28/2014  . Pain 04/14/2014  . UTI symptoms 04/14/2014  . Routine general medical examination at a health care  facility 09/26/2013  . Candidiasis of vulva and vagina 09/26/2013  . Unspecified symptom associated with female genital organs 09/10/2013  . Abnormal uterine bleeding (AUB) 09/10/2013  . PID (pelvic inflammatory disease) 07/29/2013  . Dysuria 07/29/2013  . Screening examination for venereal disease 07/29/2013  . Chronic PID (chronic pelvic inflammatory disease) 06/30/2013  . Excessive or frequent menstruation 06/30/2013  . Genital herpes, unspecified 06/30/2013  . Unspecified inflammatory disease of female pelvic organs and tissues 03/12/2013    Past Surgical History:  Procedure Laterality Date  . CYST EXCISION     middle of back  . ORIF ULNAR FRACTURE Left 03/16/2014   Procedure: OPEN REDUCTION INTERNAL FIXATION (ORIF) LEFT ULNAR FRACTURE;  Surgeon: Marlowe ShoresMatthew A Weingold, MD;  Location: [facility];  Service: Orthopedics;  Laterality: Left;  left  . OVARIAN CYST REMOVAL    . THYROID CYST EXCISION     nodule exc.  . TUBAL LIGATION       OB History    Gravida  5   Para  4   Term  4   Preterm      AB  1   Living  4     SAB      TAB      Ectopic  1   Multiple      Live Births  4  Home Medications    Prior to Admission medications   Medication Sig Start Date End Date Taking? Authorizing Provider  acetaminophen (TYLENOL) 500 MG tablet Take 1 tablet (500 mg total) by mouth every 6 (six) hours as needed. Patient not taking: Reported on 08/06/2018 07/08/18   Dartha Lodge, PA-C  ARIPiprazole (ABILIFY) 5 MG tablet Take 1 tablet (5 mg total) by mouth 3 (three) times daily. Patient not taking: Reported on 08/06/2018 05/30/18   Geoffery Lyons, MD  cetirizine (ZYRTEC) 10 MG tablet Take 1 tablet (10 mg total) by mouth daily. Patient not taking: Reported on 08/06/2018 06/16/18   Belinda Fisher, PA-C  doxycycline (VIBRAMYCIN) 100 MG capsule Take 1 capsule (100 mg total) by mouth 2 (two) times daily for 14 days. 08/06/18 08/20/18  Tyeson Tanimoto S, PA-C    hydrOXYzine (ATARAX/VISTARIL) 25 MG tablet Take 1 tablet (25 mg total) by mouth 2 (two) times daily as needed. Patient not taking: Reported on 07/08/2018 05/30/18   Geoffery Lyons, MD  ibuprofen (ADVIL,MOTRIN) 600 MG tablet Take 1 tablet (600 mg total) by mouth every 6 (six) hours as needed. Patient not taking: Reported on 08/06/2018 07/08/18   Dartha Lodge, PA-C    Family History Family History  Problem Relation Age of Onset  . Heart disease Mother   . Cancer Maternal Grandmother        breast  . Cancer Paternal Grandmother        breast    Social History Social History   Tobacco Use  . Smoking status: Current Every Day Smoker    Years: 3.00    Types: Cigars  . Smokeless tobacco: Never Used  . Tobacco comment: 1-2 cig./day  Substance Use Topics  . Alcohol use: Not Currently    Alcohol/week: 0.0 standard drinks    Comment: occ  . Drug use: Not Currently     Allergies   Latuda [lurasidone hcl]; Sulfa antibiotics; Bee venom; Flagyl [metronidazole]; Naproxen; and Tramadol   Review of Systems Review of Systems  Constitutional: Negative for chills and fever.  HENT: Negative for ear pain and sore throat.   Eyes: Negative for pain and visual disturbance.  Respiratory: Negative for cough and shortness of breath.   Cardiovascular: Negative for chest pain and palpitations.  Gastrointestinal: Positive for abdominal pain and nausea. Negative for constipation, diarrhea and vomiting.  Genitourinary: Positive for dysuria, frequency, pelvic pain and vaginal bleeding. Negative for decreased urine volume, flank pain, hematuria and vaginal discharge.  Musculoskeletal: Negative for back pain and neck pain.  Skin: Negative for color change and rash.  Neurological: Negative for syncope and headaches.  All other systems reviewed and are negative.   Physical Exam Updated Vital Signs BP 118/84   Pulse 71   Temp 98 F (36.7 C) (Oral)   Resp 18   Ht 5\' 7"  (1.702 m)   Wt 86.6 kg   LMP  07/08/2018 (Within Days)   SpO2 100%   BMI 29.91 kg/m   Physical Exam  Constitutional: She appears well-developed and well-nourished. No distress.  HENT:  Head: Normocephalic and atraumatic.  Mouth/Throat: Oropharynx is clear and moist.  Eyes: Conjunctivae are normal. No scleral icterus.  Neck: Neck supple.  Cardiovascular: Normal rate, regular rhythm, normal heart sounds and intact distal pulses.  No murmur heard. Pulmonary/Chest: Effort normal and breath sounds normal. No stridor. No respiratory distress. She has no wheezes. She has no rales.  Abdominal: Soft. There is no rigidity, no rebound and no CVA tenderness.  Left suprapubic tenderness with guarding.  Genitourinary:  Genitourinary Comments: Exam performed by Karrie Meres,  exam chaperoned Date: 08/06/2018 Pelvic exam: normal external genitalia without evidence of trauma. VULVA: normal appearing vulva with no masses, tenderness or lesion. VAGINA: normal appearing vagina with normal color and discharge, no lesions.  Moderate blood noted in the vaginal vault.  No clots noted. CERVIX: normal appearing cervix without lesions.  Patient has cervical motion tenderness, cervical os closed with out purulent discharge; no appreciable discharge from the cervix noted, Wet prep and DNA probe for chlamydia and GC obtained.   ADNEXA: Adnexa are normal in size, there is left adnexal tenderness. UTERUS: uterus is normal size, shape, consistency and nontender.   Musculoskeletal: She exhibits no edema.  Neurological: She is alert.  Skin: Skin is warm and dry.  Psychiatric: She has a normal mood and affect.  Nursing note and vitals reviewed.    ED Treatments / Results  Labs (all labs ordered are listed, but only abnormal results are displayed) Labs Reviewed  WET PREP, GENITAL - Abnormal; Notable for the following components:      Result Value   Clue Cells Wet Prep HPF POC PRESENT (*)    WBC, Wet Prep HPF POC MANY (*)    All other  components within normal limits  COMPREHENSIVE METABOLIC PANEL - Abnormal; Notable for the following components:   Glucose, Bld 123 (*)    Calcium 8.8 (*)    Total Protein 6.3 (*)    Albumin 3.4 (*)    Total Bilirubin 0.1 (*)    All other components within normal limits  URINALYSIS, ROUTINE W REFLEX MICROSCOPIC - Abnormal; Notable for the following components:   APPearance HAZY (*)    Hgb urine dipstick MODERATE (*)    Bacteria, UA RARE (*)    All other components within normal limits  URINE CULTURE  LIPASE, BLOOD  CBC  RAPID HIV SCREEN (HIV 1/2 AB+AG)  RPR  I-STAT BETA HCG BLOOD, ED ([facility], [facility], [facility] ONLY)  GC/CHLAMYDIA PROBE AMP ([place]) NOT AT [facility]    EKG None  Radiology US Transvaginal Non-ob  Result Date: 08/06/2018 CLINICAL DATA:  Pelvic pain for several weeks. EXAM: TRANSABDOMINAL AND TRANSVAGINAL ULTRASOUND OF PELVIS DOPPLER ULTRASOUND OF OVARIES TECHNIQUE: Both transabdominal and transvaginal ultrasound examinations of the pelvis were performed. Transabdominal technique was performed for global imaging of the pelvis including uterus, ovaries, adnexal regions, and pelvic cul-de-sac. It was necessary to proceed with endovaginal exam following the transabdominal exam to visualize the endometrium and ovaries. Color and duplex Doppler ultrasound was utilized to evaluate blood flow to the ovaries. COMPARISON:  Ultrasound of July 08, 2018. FINDINGS: Uterus Measurements: 10.6 x 6.6 x 4.8 cm. No fibroids or other mass visualized. Endometrium Thickness: 3 mm which is within normal limits. No focal abnormality visualized. Right ovary Measurements: 3.2 x 2.2 x 2.2 cm. Multiple follicular cysts are noted with the largest measuring 1.5 cm. Left ovary Measurements: 3.3 x 2.6 x 2.2 cm. Multiple follicular cysts are noted with the largest measuring 1.5 cm. Pulsed Doppler evaluation of both ovaries demonstrates normal low-resistance arterial and venous waveforms. Other findings No abnormal free  fluid. IMPRESSION: No significant abnormality seen in the pelvis. No evidence of ovarian torsion. Electronically Signed   By: Lupita Raider, M.D.   On: 08/06/2018 17:30   US Pelvis Complete  Result Date: 08/06/2018 CLINICAL DATA:  Pelvic pain for several weeks. EXAM: TRANSABDOMINAL AND TRANSVAGINAL ULTRASOUND OF PELVIS DOPPLER ULTRASOUND OF  OVARIES TECHNIQUE: Both transabdominal and transvaginal ultrasound examinations of the pelvis were performed. Transabdominal technique was performed for global imaging of the pelvis including uterus, ovaries, adnexal regions, and pelvic cul-de-sac. It was necessary to proceed with endovaginal exam following the transabdominal exam to visualize the endometrium and ovaries. Color and duplex Doppler ultrasound was utilized to evaluate blood flow to the ovaries. COMPARISON:  Ultrasound of July 08, 2018. FINDINGS: Uterus Measurements: 10.6 x 6.6 x 4.8 cm. No fibroids or other mass visualized. Endometrium Thickness: 3 mm which is within normal limits. No focal abnormality visualized. Right ovary Measurements: 3.2 x 2.2 x 2.2 cm. Multiple follicular cysts are noted with the largest measuring 1.5 cm. Left ovary Measurements: 3.3 x 2.6 x 2.2 cm. Multiple follicular cysts are noted with the largest measuring 1.5 cm. Pulsed Doppler evaluation of both ovaries demonstrates normal low-resistance arterial and venous waveforms. Other findings No abnormal free fluid. IMPRESSION: No significant abnormality seen in the pelvis. No evidence of ovarian torsion. Electronically Signed   By: Lupita Raider, M.D.   On: 08/06/2018 17:30   [place] Art/ven Flow Abd Pelv Doppler  Result Date: 08/06/2018 CLINICAL DATA:  Pelvic pain for several weeks. EXAM: TRANSABDOMINAL AND TRANSVAGINAL ULTRASOUND OF PELVIS DOPPLER ULTRASOUND OF OVARIES TECHNIQUE: Both transabdominal and transvaginal ultrasound examinations of the pelvis were performed. Transabdominal technique was performed for global imaging of the  pelvis including uterus, ovaries, adnexal regions, and pelvic cul-de-sac. It was necessary to proceed with endovaginal exam following the transabdominal exam to visualize the endometrium and ovaries. Color and duplex Doppler ultrasound was utilized to evaluate blood flow to the ovaries. COMPARISON:  Ultrasound of July 08, 2018. FINDINGS: Uterus Measurements: 10.6 x 6.6 x 4.8 cm. No fibroids or other mass visualized. Endometrium Thickness: 3 mm which is within normal limits. No focal abnormality visualized. Right ovary Measurements: 3.2 x 2.2 x 2.2 cm. Multiple follicular cysts are noted with the largest measuring 1.5 cm. Left ovary Measurements: 3.3 x 2.6 x 2.2 cm. Multiple follicular cysts are noted with the largest measuring 1.5 cm. Pulsed Doppler evaluation of both ovaries demonstrates normal low-resistance arterial and venous waveforms. Other findings No abnormal free fluid. IMPRESSION: No significant abnormality seen in the pelvis. No evidence of ovarian torsion. Electronically Signed   By: Lupita Raider, M.D.   On: 08/06/2018 17:30    Procedures Procedures (including critical care time)  Medications Ordered in ED Medications  promethazine (PHENERGAN) tablet 12.5 mg (has no administration in time range)  oxyCODONE-acetaminophen (PERCOCET/ROXICET) 5-325 MG per tablet 1 tablet (has no administration in time range)  ketorolac (TORADOL) 30 MG/ML injection 15 mg (15 mg Intramuscular Given 08/06/18 1634)  cefTRIAXone (ROCEPHIN) injection 250 mg (250 mg Intramuscular Given 08/06/18 1634)  azithromycin (ZITHROMAX) tablet 1,000 mg (1,000 mg Oral Given 08/06/18 1634)  lidocaine (PF) (XYLOCAINE) 1 % injection (  Given 08/06/18 1634)  metroNIDAZOLE (FLAGYL) tablet 2,000 mg (2,000 mg Oral Given 08/06/18 1731)  ondansetron (ZOFRAN-ODT) disintegrating tablet 4 mg (4 mg Oral Given 08/06/18 1731)     Initial Impression / Assessment and Plan / ED Course  I have reviewed the triage vital signs and the nursing  notes.  Pertinent labs & imaging results that were available during my care of the patient were reviewed by me and considered in my medical decision making (see chart for details).    Final Clinical Impressions(s) / ED Diagnoses   Final diagnoses:  Pelvic inflammatory disease  Bacterial vaginosis   Patient presenting with left  suprapubic abdominal pain that has been ongoing intermittently for several weeks.  Has left adnexal tenderness and cervical motion tenderness on exam.  Patient high risk for STD and does already have a history of PID therefore we will treat for this.  She was given ceftriaxone and azithromycin in the ED and will be discharged with doxycycline.  Given her left adnexal tenderness will obtain ultrasound of the pelvis to rule out ovarian torsion or other etiology of her symptoms.  Her abdominal exam is benign and do not suspect intra-abdominal pelvic pathology at this time.  Labs are without leukocytosis or anemia, despite her reported ongoing bleeding. CMP with normal kidney and liver function, normal electrolytes. Lipase negative.  UA with hematuria and rare bacteria, no leukocytes or nitrites, 6-10 RBCs and WBCs, rare bacteria. Culture sent, will hold on tx for uti as sxs seem more consistent with PID.  Wet prep with clue cells and WBC, odor on exam consistent with BV. Will tx with 2mg  flagyl.   Gc chlamydia pending. HIV/RPR pending.   Pelvic US with multiple follicular cysts, but no evidence of torsion or other significant abnormality.   Unclear etiology of vaginal bleeding, pt unsure if she is on her menses. She was here 1 month ago with similar c/o bleeding. hgb stable. Have advised her to f/u with her ob-gyn.  Pain managed while in ED and pt tolerating PO.  tx'ed for suspected STD and BV in ED. Rx for doxycycline for PID. Have advised to f/u with pcp in 1 week and return if worse. Pt voiced understanding of the plan and reasons to return to the ED. All questions  answered.  ED Discharge Orders         Ordered    doxycycline (VIBRAMYCIN) 100 MG capsule  2 times daily     08/06/18 1738           Pearlean Sabina S, PA-C 08/06/18 1747    Melene Plan, DO 08/06/18 2221

## 2018-08-06 NOTE — ED Notes (Signed)
Patient transported to Ultrasound 

## 2018-08-07 LAB — URINE CULTURE

## 2018-08-07 LAB — GC/CHLAMYDIA PROBE AMP (~~LOC~~) NOT AT ARMC
Chlamydia: POSITIVE — AB
Neisseria Gonorrhea: POSITIVE — AB

## 2018-08-07 LAB — RPR: RPR Ser Ql: NONREACTIVE

## 2018-08-25 ENCOUNTER — Encounter (HOSPITAL_COMMUNITY): Payer: Self-pay | Admitting: Emergency Medicine

## 2018-08-25 ENCOUNTER — Emergency Department (HOSPITAL_COMMUNITY)
Admission: EM | Admit: 2018-08-25 | Discharge: 2018-08-25 | Disposition: A | Discharge status: Home / Self Care | Payer: Medicaid Other | Attending: Emergency Medicine | Admitting: Emergency Medicine

## 2018-08-25 ENCOUNTER — Emergency Department (HOSPITAL_COMMUNITY): Payer: Medicaid Other

## 2018-08-25 DIAGNOSIS — Y999 Unspecified external cause status: Secondary | ICD-10-CM | POA: Insufficient documentation

## 2018-08-25 DIAGNOSIS — Y929 Unspecified place or not applicable: Secondary | ICD-10-CM | POA: Insufficient documentation

## 2018-08-25 DIAGNOSIS — S0990XA Unspecified injury of head, initial encounter: Secondary | ICD-10-CM | POA: Diagnosis not present

## 2018-08-25 DIAGNOSIS — Y939 Activity, unspecified: Secondary | ICD-10-CM | POA: Insufficient documentation

## 2018-08-25 DIAGNOSIS — Z23 Encounter for immunization: Secondary | ICD-10-CM | POA: Diagnosis not present

## 2018-08-25 DIAGNOSIS — F17210 Nicotine dependence, cigarettes, uncomplicated: Secondary | ICD-10-CM | POA: Diagnosis not present

## 2018-08-25 DIAGNOSIS — T07XXXA Unspecified multiple injuries, initial encounter: Secondary | ICD-10-CM

## 2018-08-25 DIAGNOSIS — S022XXA Fracture of nasal bones, initial encounter for closed fracture: Secondary | ICD-10-CM

## 2018-08-25 LAB — I-STAT BETA HCG BLOOD, ED (MC, WL, AP ONLY): I-stat hCG, quantitative: <5 m[IU]/mL (ref <5)

## 2018-08-25 LAB — ETHANOL: Alcohol, Ethyl (B): 65 mg/dL — ABNORMAL HIGH (ref <10)

## 2018-08-25 MED ORDER — TETANUS-DIPHTH-ACELL PERTUSSIS 5-2.5-18.5 LF-MCG/0.5 IM SUSP
0.5000 mL | Freq: Once | INTRAMUSCULAR | Status: AC
  Administered 2018-08-25: 0.5 mL via INTRAMUSCULAR
  Filled 2018-08-25: qty 0.5

## 2018-08-25 MED ORDER — IBUPROFEN 400 MG PO TABS
400.0000 mg | ORAL_TABLET | Freq: Once | ORAL | Status: AC
  Administered 2018-08-25: 400 mg via ORAL
  Filled 2018-08-25: qty 1
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
## 2018-08-25 NOTE — ED Provider Notes (Signed)
Patient is now alert, and complains of pain in the left side of her head in her left leg.  Is been able to ambulate to the bathroom.  Findings discussed with the patient and all questions were answered.  She states [place]  Findings discussed with the patient and all questions were answered.  I advised symptomatic treatment and rest.   Mancel Bale, MD 08/25/18 1342
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
## 2018-08-25 NOTE — ED Notes (Signed)
Patient transported to CT 

## 2018-08-25 NOTE — Discharge Instructions (Addendum)
There were no serious injuries found.  Use ibuprofen as needed for pain.
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
## 2018-09-24 IMAGING — CT CT RENAL STONE PROTOCOL
2 of 5 series · 16 of 46 positions shown, 18 images · non-contrast
Comparison: 05/02/2015

CLINICAL DATA: Left flank pain for 2 days

EXAM:
CT ABDOMEN AND PELVIS WITHOUT CONTRAST
TECHNIQUE: Multidetector CT imaging of the abdomen and pelvis was performed
following the standard protocol without IV contrast.

[Series 4: stone study 5.0 i30f 1 · axial · 0.62mm/px · z∈[+954,+1354]mm · 13 of 88 slices shown, 15 images]
[im 4/88  soft-tissue]
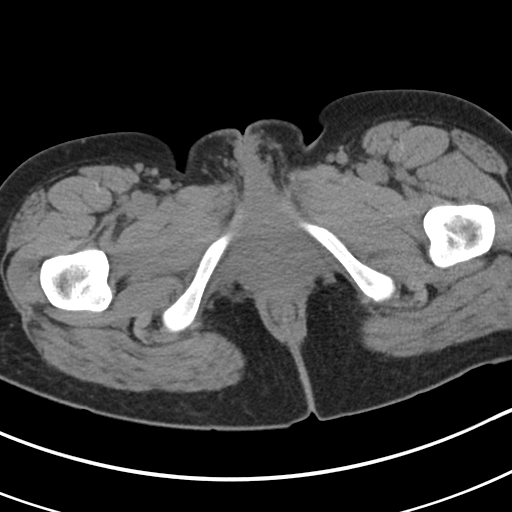
[im 4/88  bone]
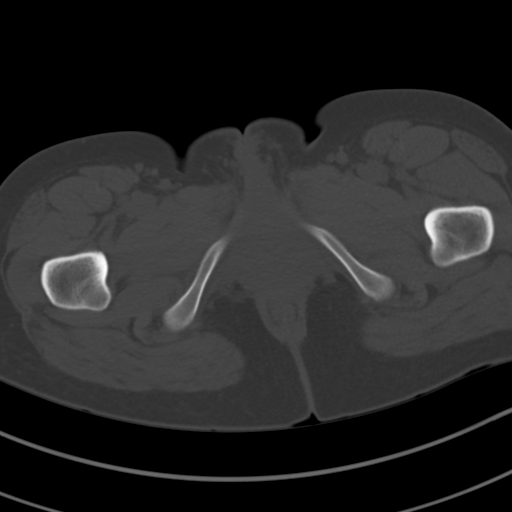
[im 12/88  soft-tissue]
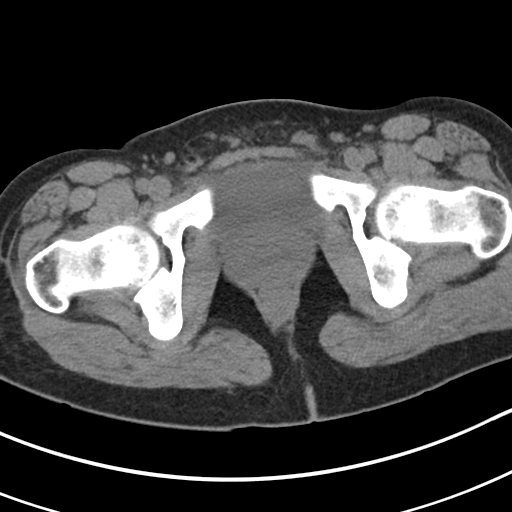
[im 20/88  soft-tissue]
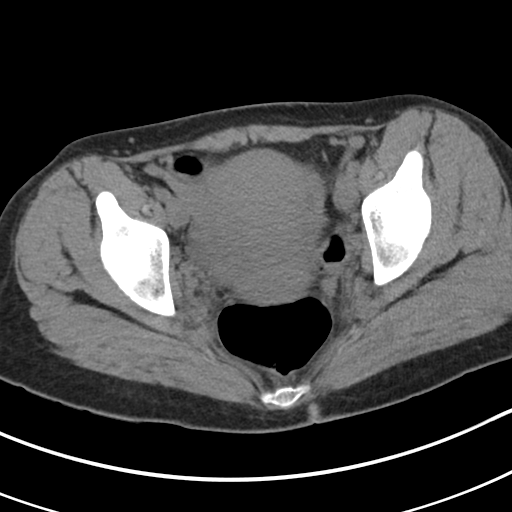
[im 24/88  soft-tissue]
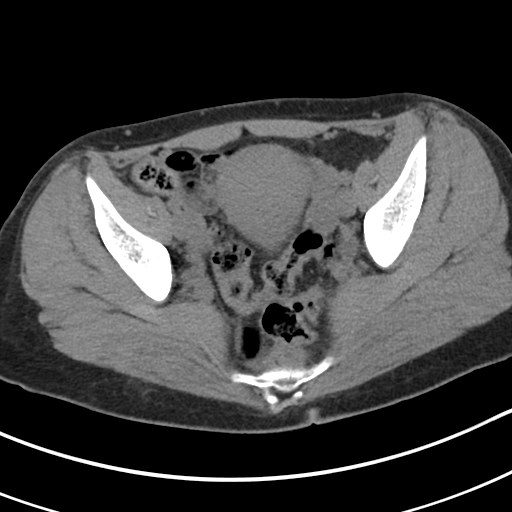
[im 32/88  soft-tissue]
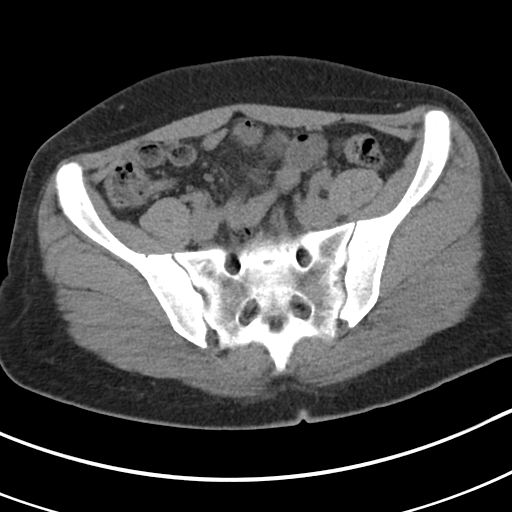
[im 36/88  soft-tissue]
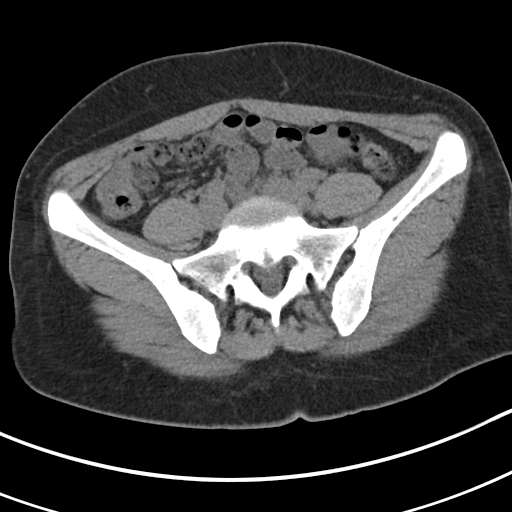
[im 44/88  soft-tissue]
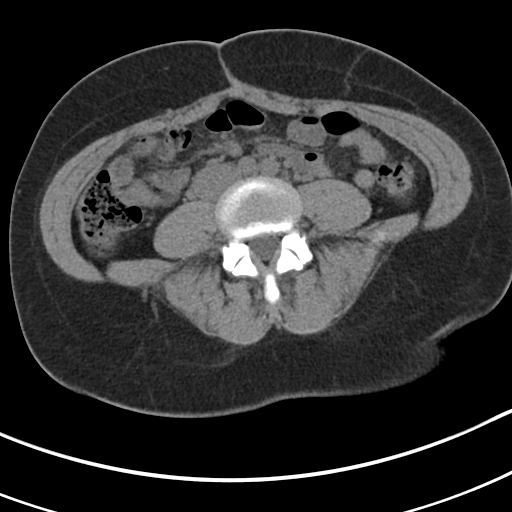
[im 52/88  soft-tissue]
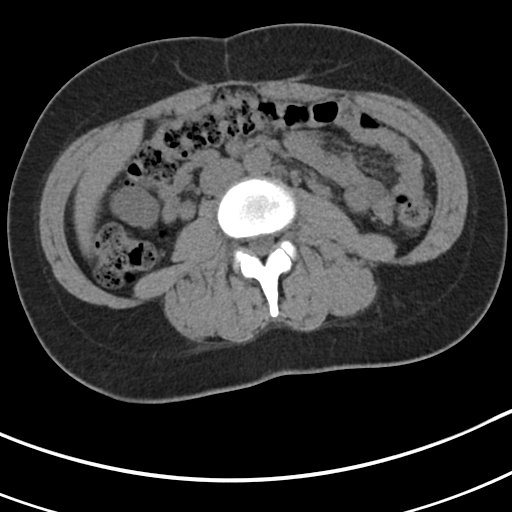
[im 56/88  soft-tissue]
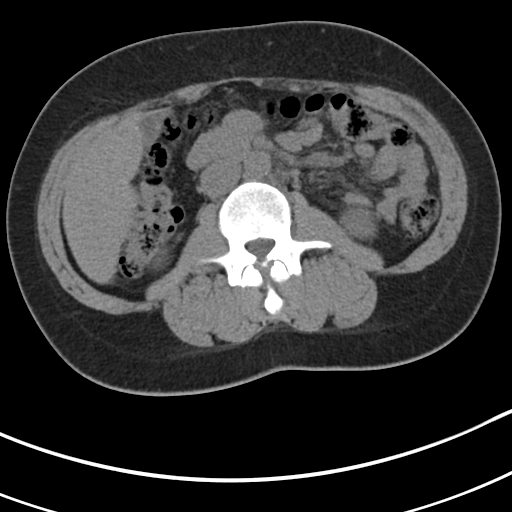
[im 56/88  bone]
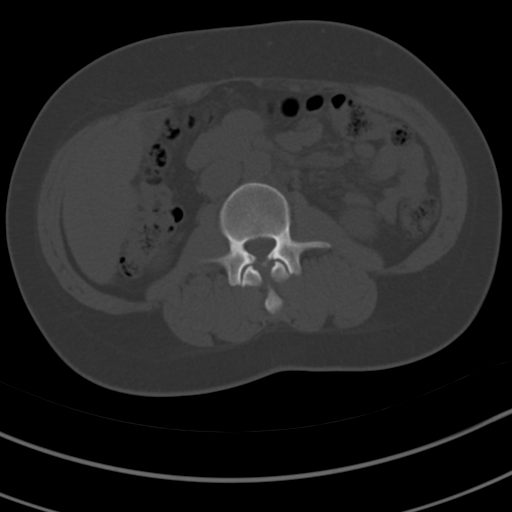
[im 64/88  soft-tissue]
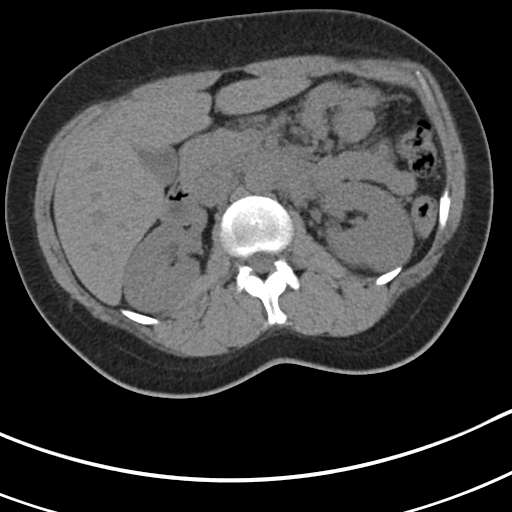
[im 68/88  soft-tissue]
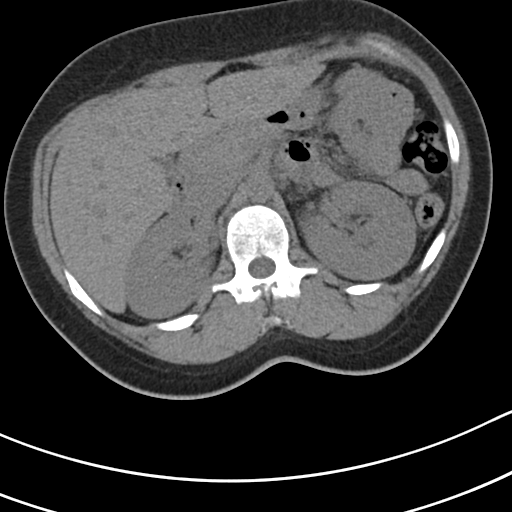
[im 76/88  soft-tissue]
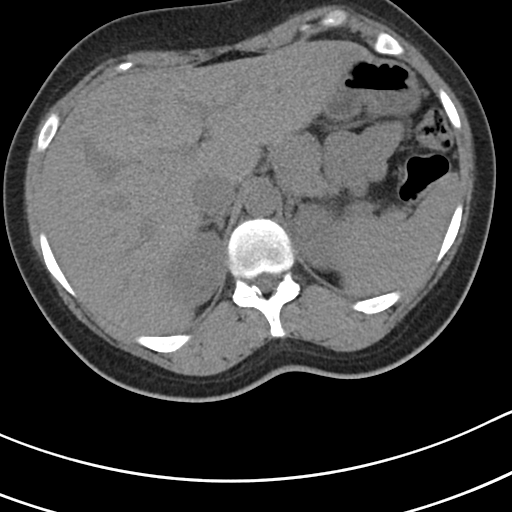
[im 84/88  soft-tissue]
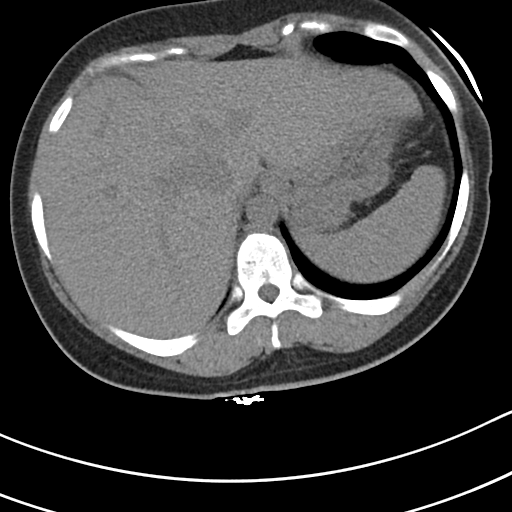

[Series 6: coronal soft tissue · coronal · 0.64mm/px · 3 of 89 slices shown]
[im 30/89  soft-tissue]
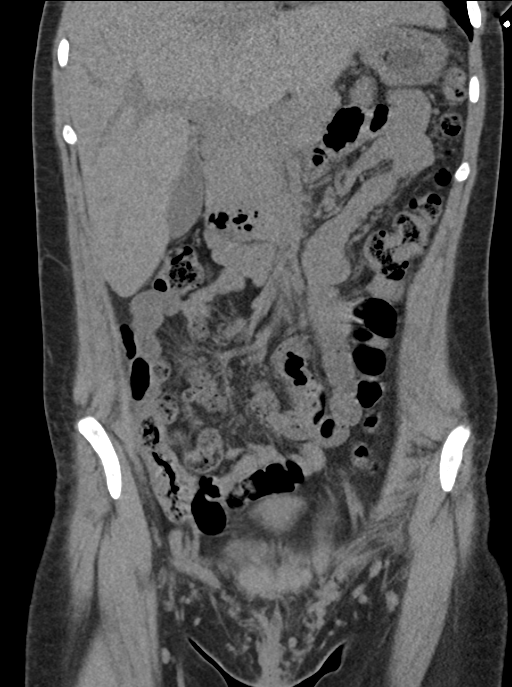
[im 40/89  soft-tissue]
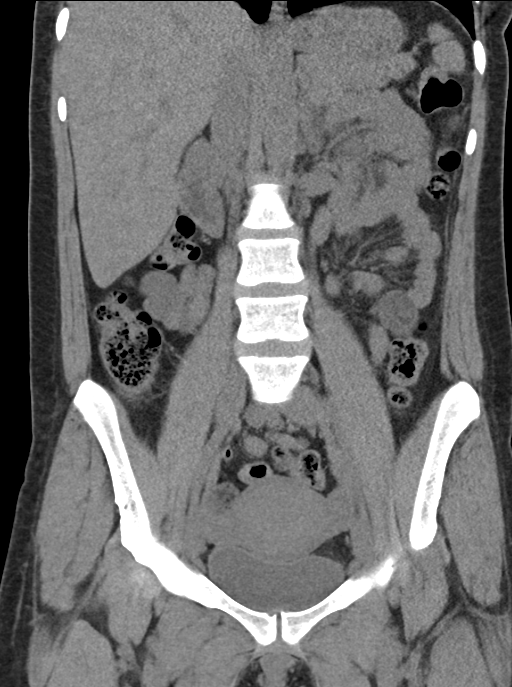
[im 49/89  soft-tissue]
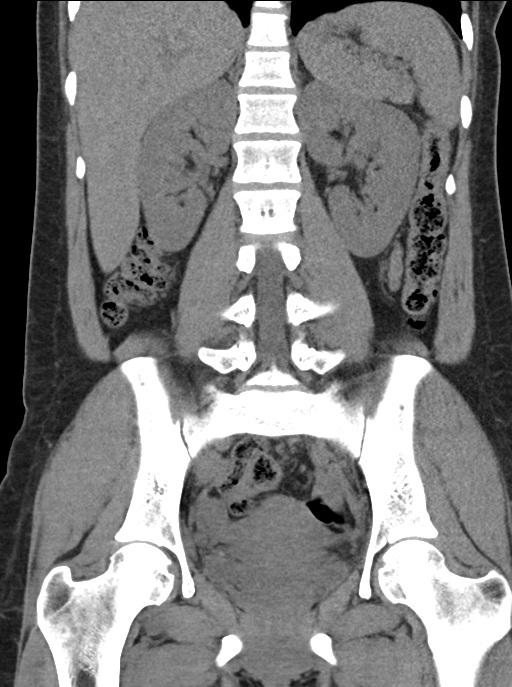

[16 of 46 positions shown; findings below may reference images not displayed]

FINDINGS: Lower chest: Lung bases are unremarkable.

Hepatobiliary: Unenhanced liver shows no biliary ductal dilatation.
No calcified gallstones are noted within gallbladder.

Pancreas: Unenhanced pancreas is unremarkable.

Spleen: Unenhanced spleen is unremarkable.

Adrenals/Urinary Tract: No adrenal gland mass. Unenhanced kidneys
shows no nephrolithiasis. No hydronephrosis or hydroureter. No
calcified ureteral calculi are noted. No calcified calculi are noted
within under distended urinary bladder.

Stomach/Bowel: No small bowel obstruction. No gastric outlet
obstruction. Moderate stool noted in right colon and transverse
colon. Some colonic stool noted descending colon and rectosigmoid
colon. Moderate gas noted within rectum. No distal colonic
obstruction. No colitis or diverticulitis. No pericecal
inflammation. Normal appendix.

Vascular/Lymphatic: No aortic aneurysm. No retroperitoneal or
mesenteric adenopathy.

Reproductive: Unenhanced uterus is unremarkable. No adnexal masses
noted.

Other: No ascites or free abdominal air.  No inguinal adenopathy.

Musculoskeletal: No destructive bony lesions are noted. Sagittal
images of the spine are unremarkable. Mild degenerative changes
lower thoracic spine.
IMPRESSION: 1. No nephrolithiasis.  No hydronephrosis or hydroureter.
2. No calcified ureteral calculi.
3. No calcified calculi are noted within under distended urinary
bladder.
4. Normal appendix.  No pericecal inflammation.
5. No small bowel obstruction.

## 2018-09-30 ENCOUNTER — Emergency Department (HOSPITAL_COMMUNITY): Payer: Medicaid Other

## 2018-09-30 ENCOUNTER — Other Ambulatory Visit: Payer: Self-pay

## 2018-09-30 ENCOUNTER — Emergency Department (HOSPITAL_COMMUNITY)
Admission: EM | Admit: 2018-09-30 | Discharge: 2018-09-30 | Disposition: A | Discharge status: Home / Self Care | Payer: Medicaid Other | Attending: Emergency Medicine | Admitting: Emergency Medicine

## 2018-09-30 ENCOUNTER — Encounter (HOSPITAL_COMMUNITY): Payer: Self-pay

## 2018-09-30 DIAGNOSIS — R6884 Jaw pain: Secondary | ICD-10-CM | POA: Insufficient documentation

## 2018-09-30 DIAGNOSIS — Z79899 Other long term (current) drug therapy: Secondary | ICD-10-CM | POA: Diagnosis not present

## 2018-09-30 DIAGNOSIS — Z87891 Personal history of nicotine dependence: Secondary | ICD-10-CM | POA: Diagnosis not present

## 2018-09-30 DIAGNOSIS — R0781 Pleurodynia: Secondary | ICD-10-CM | POA: Insufficient documentation

## 2018-09-30 LAB — I-STAT BETA HCG BLOOD, ED (MC, WL, AP ONLY): I-stat hCG, quantitative: <5 m[IU]/mL (ref <5)

## 2018-09-30 MED ORDER — ACETAMINOPHEN 325 MG PO TABS: 650.0000 mg | ORAL_TABLET | Freq: Once | ORAL | Status: DC

## 2018-09-30 MED ORDER — ACETAMINOPHEN 500 MG PO TABS: 500.0000 mg | ORAL_TABLET | Freq: Four times a day (QID) | ORAL | 0 refills | Status: DC | PRN

## 2018-09-30 MED ORDER — IBUPROFEN 800 MG PO TABS: 800.0000 mg | ORAL_TABLET | Freq: Three times a day (TID) | ORAL | 0 refills | Status: DC
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
## 2018-09-30 NOTE — ED Notes (Signed)
Pt states she has fracture information from [place]. Request toi speak with someone in charge.

## 2018-09-30 NOTE — Discharge Instructions (Addendum)
Take medications as prescribed. Return to the emergency room for worsening condition or new concerning symptoms. Follow up with your regular doctor. If you don't have a regular doctor use one of the numbers below to establish a primary care doctor. ° ° °Emergency Department Resource Guide °1) Find a Doctor and Pay Out of Pocket °Although you won't have to find out who is covered by your insurance plan, it is a good idea to ask around and get recommendations. You will then need to call the office and see if the doctor you have chosen will accept you as a new patient and what types of options they offer for patients who are self-pay. Some doctors offer discounts or will set up payment plans for their patients who do not have insurance, but you will need to ask so you aren't surprised when you get to your appointment. ° °2) Contact Your Local Health Department °Not all health departments have doctors that can see patients for sick visits, but many do, so it is worth a call to see if yours does. If you don't know where your local health department is, you can check in your phone book. The CDC also has a tool to help you locate your state's health department, and many state websites also have listings of all of their local health departments. ° °3) Find a Walk-in Clinic °If your illness is not likely to be very severe or complicated, you may want to try a walk in clinic. These are popping up all over the country in pharmacies, drugstores, and shopping centers. They're usually staffed by nurse practitioners or physician assistants that have been trained to treat common illnesses and complaints. They're usually fairly quick and inexpensive. However, if you have serious medical issues or chronic medical problems, these are probably not your best option. ° °No Primary Care Doctor: °- Call [facility] at  832-8000 - they can help you locate a primary care doctor that  accepts your insurance, provides certain services,  etc. °- Physician Referral Service- 1-800-533-3463 ° °Emergency Department Resource Guide °1) Find a Doctor and Pay Out of Pocket °Although you won't have to find out who is covered by your insurance plan, it is a good idea to ask around and get recommendations. You will then need to call the office and see if the doctor you have chosen will accept you as a new patient and what types of options they offer for patients who are self-pay. Some doctors offer discounts or will set up payment plans for their patients who do not have insurance, but you will need to ask so you aren't surprised when you get to your appointment. ° °2) Contact Your Local Health Department °Not all health departments have doctors that can see patients for sick visits, but many do, so it is worth a call to see if yours does. If you don't know where your local health department is, you can check in your phone book. The CDC also has a tool to help you locate your state's health department, and many state websites also have listings of all of their local health departments. ° °3) Find a Walk-in Clinic °If your illness is not likely to be very severe or complicated, you may want to try a walk in clinic. These are popping up all over the country in pharmacies, drugstores, and shopping centers. They're usually staffed by nurse practitioners or physician assistants that have been trained to treat common illnesses and complaints. They're usually fairly   quick and inexpensive. However, if you have serious medical issues or chronic medical problems, these are probably not your best option. ° °No Primary Care Doctor: °- Call [facility] at  832-8000 - they can help you locate a primary care doctor that  accepts your insurance, provides certain services, etc. °- Physician Referral Service- 1-800-533-3463 ° °Chronic Pain Problems: °Organization         Address  Phone   Notes  °[facility]  ([phone]) [phone] Patients need to be referred by  their primary care doctor.  ° °Medication Assistance: °Organization         Address  Phone   Notes  °[place] [facility] [address] °[place], [place] [address] [phone]) [phone] --Must be a resident of [place] °-- Must have NO insurance coverage whatsoever (no Medicaid/ Medicare, etc.) °-- The pt. MUST have a primary care doctor that directs their care regularly and follows them in the community °  °[facility]  (866) 331-1348   °[employer]  (888) 892-1162   ° °Agencies that provide inexpensive medical care: °Organization         Address  Phone   Notes  °[facility]  ([phone]) [phone]   °[facility]    (336) 832-7272   °[facility] [address] °[place], [place] [address] (336) [phone]   °[facility] [address], °[place] (336) [phone]   °[facility]    (336) 373-0678   °[facility]    (336) 272-1050   °[facility] ° [address], [place] Phone:  (336) 832-4444, Fax:  (336) [phone] Hours of Operation:  9 am - 6 pm, M-F.  Also accepts Medicaid/Medicare and self-pay.  °[facility] ° [address], [place] Phone: (336) [phone], Fax: ([phone]) [phone]. Hours of Operation:  8:30 am - 5:30 pm, M-F.  Also accepts Medicaid and self-pay.  °[facility] [place] [address], [place] Phone: (336) [phone]   °[facility] [address], [place],  (336)723-1848, Ext. 123 Mondays & Thursdays: 7-9 AM.  First 15 patients are seen on a first come, first serve basis. °  ° °Medicaid-accepting [place] Providers: ° °Organization         Address  Phone   Notes  °[facility] [address], [place] ([phone]) [phone] Also accepts self-pay patients.  °[facility] [address], [place] ° ([phone]) [phone]   °[facility]  [address], [place] (336) [phone]   °[facility] [address] [place] [address], [place] (336) [phone]   °[facility] [address], [place]  ° ([phone]) [phone] Only accepts [place] Access Medicaid patients after they have their name applied to their card.  ° °Self-Pay (no insurance) in [place]: ° °Organization         Address  Phone   Notes  °Sickle Cell Patients, [facility] [address], [place] ([phone]) [phone]   °[facility] [address], [place] ([phone]) [phone]   °[facility] ° [address], [place] ([phone]) [phone]   °[facility]/Dr. Osei-Bonsu ° [address] [place] [address], [place] or [address], [place] [phone]) [phone] Phone number   for both [place] and [place] locations is the same.  °[facility] [address], [place] ([phone]) [phone]   °[facility] [address] [place] [address], [place] or [address] ([phone]) [phone] °(336) [phone]   °[facility] [address], [place] (336) [phone], phone; ([phone]) [phone], fax Sees patients 1st and 3rd Saturday of every month.  Must not qualify for public or private insurance (i.e. Medicaid, Medicare, [place] Health Choice, Veterans' Benefits) • Household income should be no more than 200% of the poverty level •The clinic cannot treat you if you are pregnant or think you are pregnant • Sexually transmitted diseases are not treated at the clinic.  ° ° ° °
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
## 2018-09-30 NOTE — ED Notes (Signed)
Patient transported to X-ray 
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
## 2018-09-30 NOTE — ED Notes (Signed)
Pt would not sign any discharge and just got up and left.
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
## 2018-09-30 NOTE — ED Notes (Signed)
Pt eating crackers and sandwhich with not distress noted.
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
## 2018-10-07 ENCOUNTER — Emergency Department (HOSPITAL_COMMUNITY)
Admission: EM | Admit: 2018-10-07 | Discharge: 2018-10-07 | Disposition: A | Discharge status: Home / Self Care | Payer: Medicaid Other | Source: Home / Self Care | Attending: Emergency Medicine | Admitting: Emergency Medicine

## 2018-10-07 ENCOUNTER — Encounter (HOSPITAL_COMMUNITY): Payer: Self-pay

## 2018-10-07 ENCOUNTER — Emergency Department (HOSPITAL_COMMUNITY)
Admission: EM | Admit: 2018-10-07 | Discharge: 2018-10-07 | Disposition: A | Discharge status: Home / Self Care | Payer: Medicaid Other | Attending: Emergency Medicine | Admitting: Emergency Medicine

## 2018-10-07 ENCOUNTER — Encounter: Payer: Self-pay | Admitting: Emergency Medicine

## 2018-10-07 DIAGNOSIS — R51 Headache: Secondary | ICD-10-CM | POA: Insufficient documentation

## 2018-10-07 DIAGNOSIS — Z87891 Personal history of nicotine dependence: Secondary | ICD-10-CM | POA: Insufficient documentation

## 2018-10-07 DIAGNOSIS — R519 Headache, unspecified: Secondary | ICD-10-CM

## 2018-10-07 DIAGNOSIS — S0240ED Zygomatic fracture, right side, subsequent encounter for fracture with routine healing: Secondary | ICD-10-CM | POA: Insufficient documentation

## 2018-10-07 DIAGNOSIS — Z79899 Other long term (current) drug therapy: Secondary | ICD-10-CM | POA: Diagnosis not present

## 2018-10-07 MED ORDER — OXYCODONE-ACETAMINOPHEN 5-325 MG PO TABS
1.0000 | ORAL_TABLET | Freq: Once | ORAL | Status: AC
  Administered 2018-10-07: 1 via ORAL
  Filled 2018-10-07: qty 1
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
## 2018-10-07 NOTE — Discharge Instructions (Signed)
Today I offered to switch you from ibuprofen over to meloxicam which you declined.  Please follow-up with the ear nose and throat doctor as you were instructed to earlier today.  Please eat a soft diet.     Please take Ibuprofen (Advil, motrin) and Tylenol (acetaminophen) to relieve your pain.  You may take up to 600 MG (3 pills) of normal strength ibuprofen every 8 hours as needed.  In between doses of ibuprofen you make take tylenol, up to 1,000 mg (two extra strength pills).  Do not take more than 3,000 mg tylenol in a 24 hour period.  Please check all medication labels as many medications such as pain and cold medications may contain tylenol.  Do not drink alcohol while taking these medications.  Do not take other NSAID'S while taking ibuprofen (such as aleve or naproxen).  Please take ibuprofen with food to decrease stomach upset.
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
## 2020-07-07 IMAGING — US US ABDOMEN LIMITED
1 series · 14 of 25 positions shown · non-contrast
Comparison: CT 01/31/2017

CLINICAL DATA: Abdominal pain

EXAM:
ULTRASOUND ABDOMEN LIMITED RIGHT UPPER QUADRANT

[Series 1: us abdomen limited · 0.20mm/px · 14 of 41 slices shown]
[im 1/41]
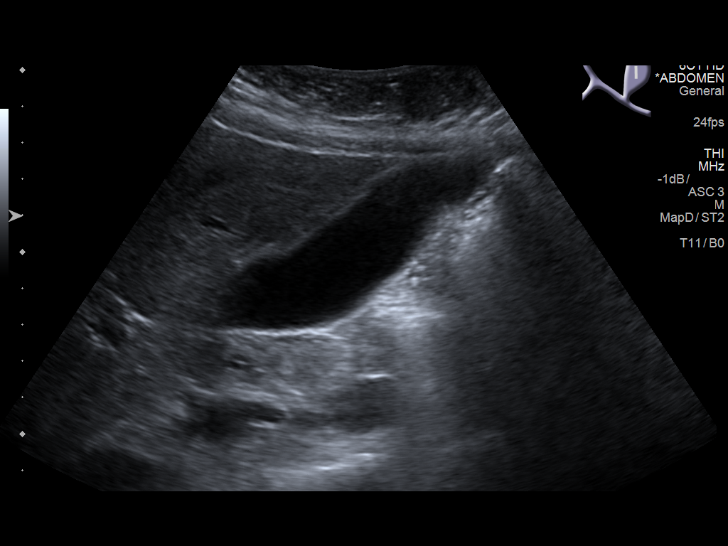
[im 4/41]
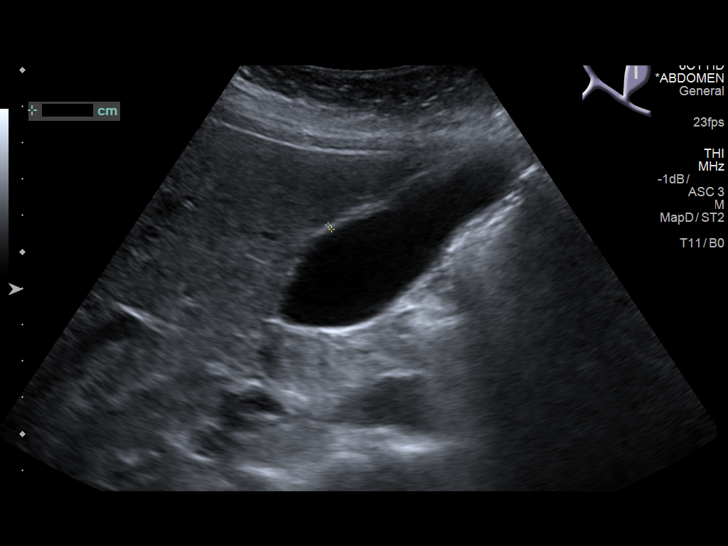
[im 7/41]
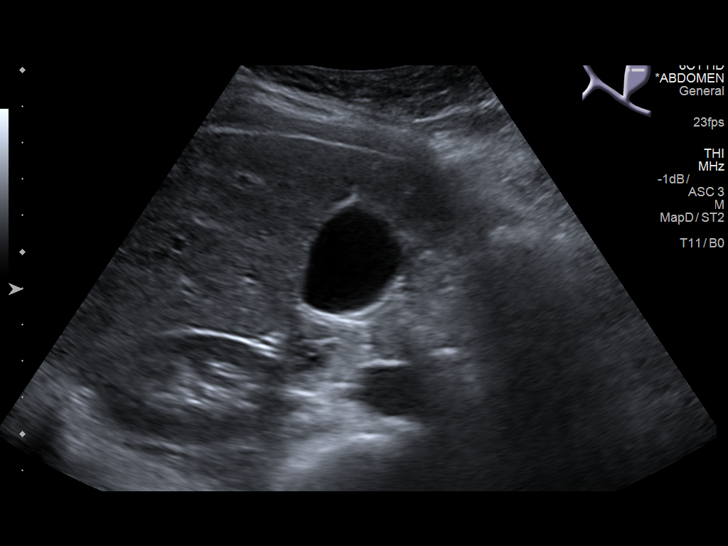
[im 11/41]
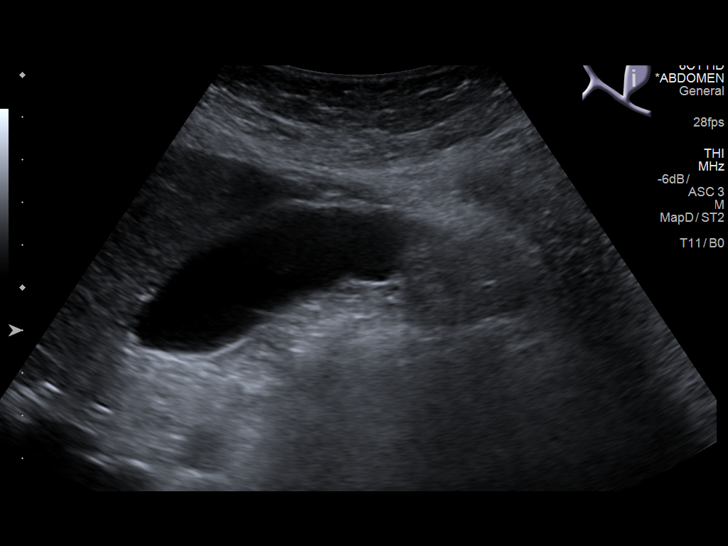
[im 14/41]
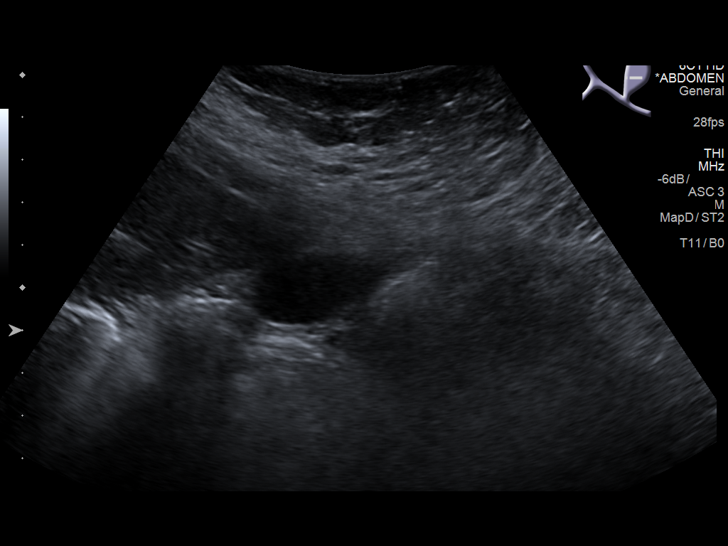
[im 16/41]
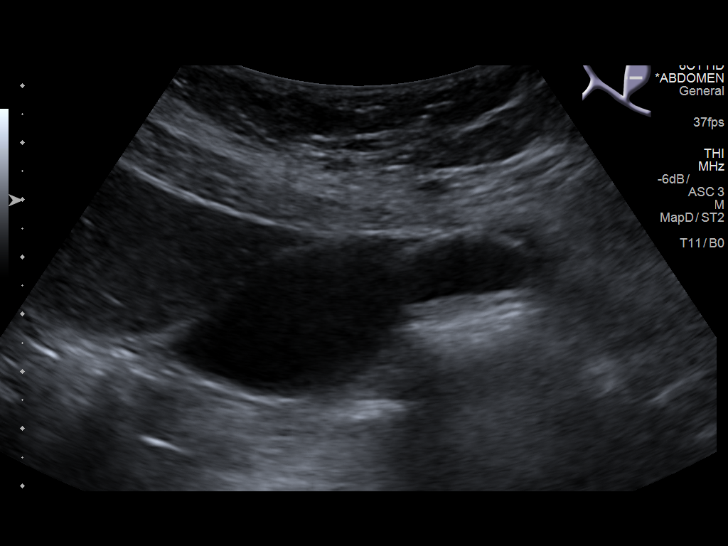
[im 19/41]
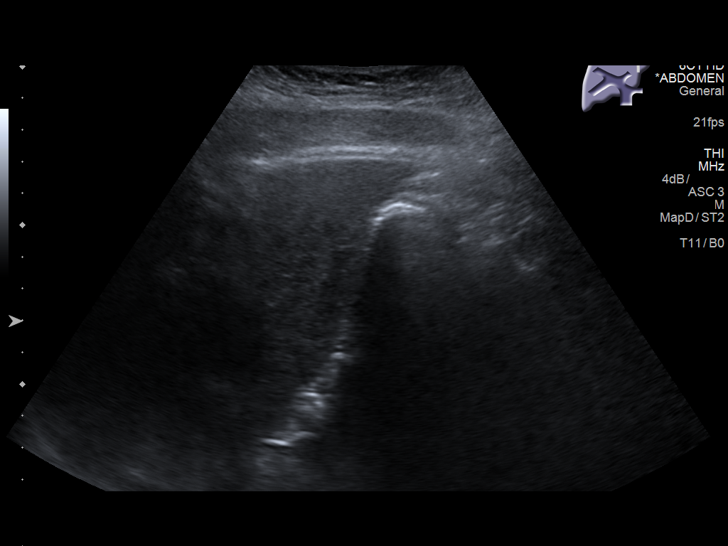
[im 22/41]
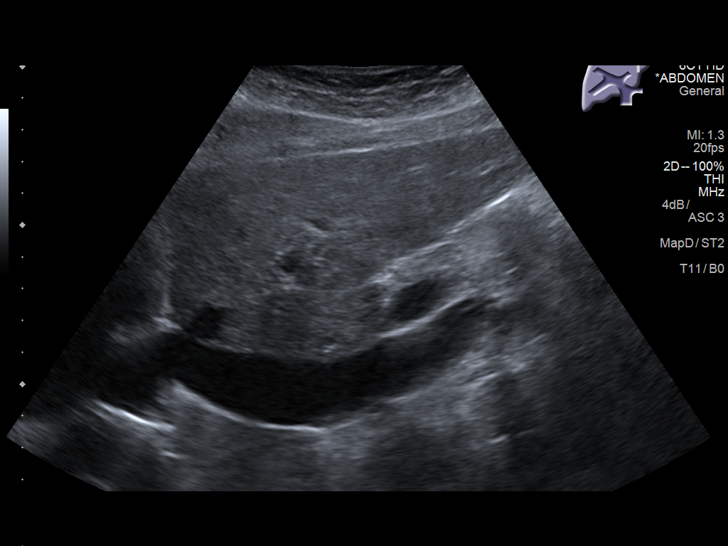
[im 26/41]
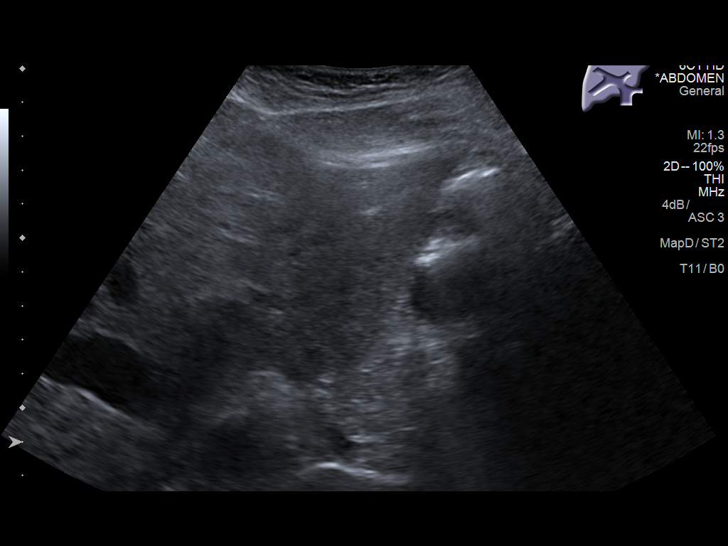
[im 27/41]
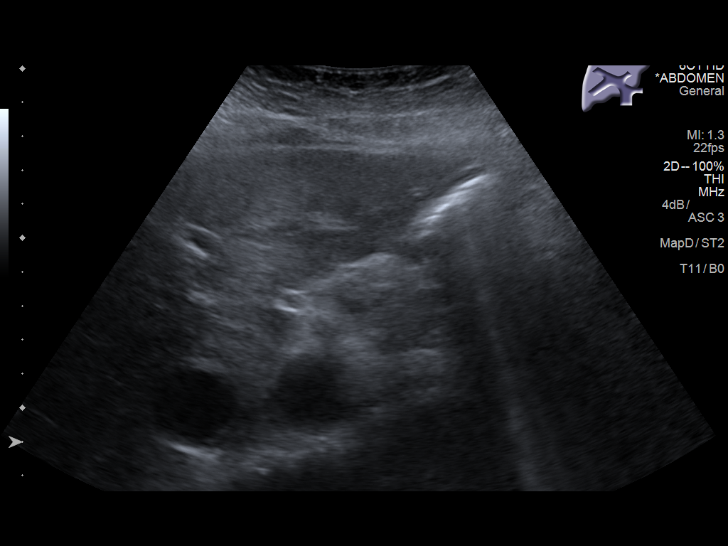
[im 31/41]
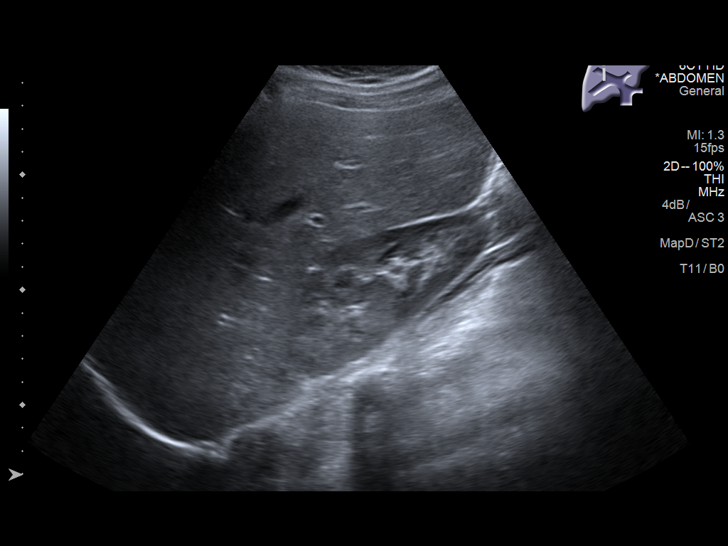
[im 34/41]
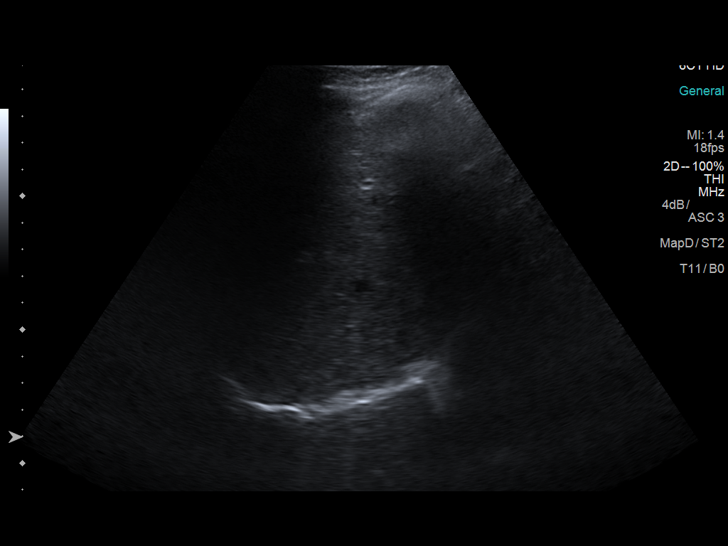
[im 37/41]
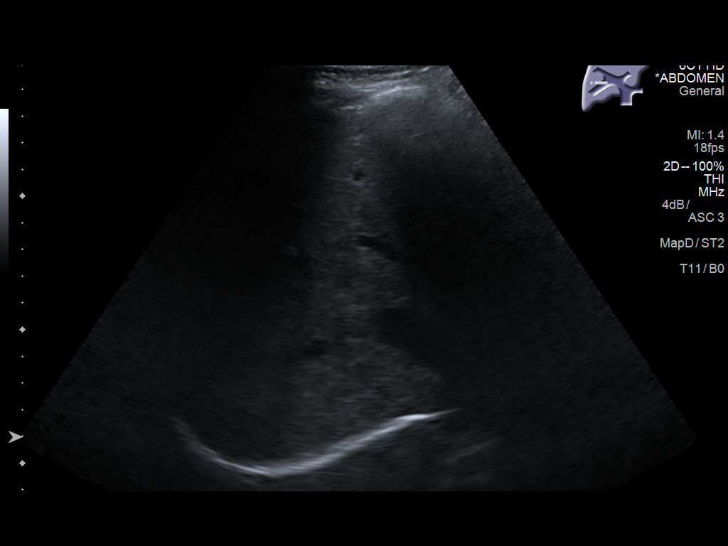
[im 41/41]
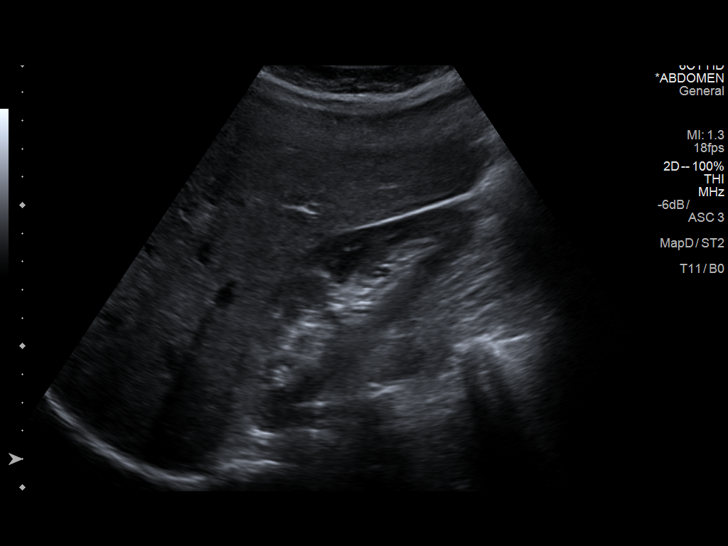

[14 of 25 positions shown; findings below may reference images not displayed]

FINDINGS: Gallbladder:

No gallstones or wall thickening visualized. No sonographic Murphy
sign noted by sonographer.

Common bile duct:

Diameter: Normal caliber, 2 mm

Liver:

No focal lesion identified. Within normal limits in parenchymal
echogenicity. Portal vein is patent on color Doppler imaging with
normal direction of blood flow towards the liver.
IMPRESSION: Normal right upper quadrant ultrasound.

## 2021-01-29 ENCOUNTER — Emergency Department (HOSPITAL_COMMUNITY)
Admission: EM | Admit: 2021-01-29 | Discharge: 2021-01-30 | Disposition: A | Discharge status: Home / Self Care | Payer: Medicaid Other | Attending: Emergency Medicine | Admitting: Emergency Medicine

## 2021-01-29 ENCOUNTER — Other Ambulatory Visit: Payer: Self-pay

## 2021-01-29 DIAGNOSIS — R300 Dysuria: Secondary | ICD-10-CM | POA: Diagnosis not present

## 2021-01-29 DIAGNOSIS — T400X1A Poisoning by opium, accidental (unintentional), initial encounter: Secondary | ICD-10-CM | POA: Diagnosis not present

## 2021-01-29 DIAGNOSIS — R079 Chest pain, unspecified: Secondary | ICD-10-CM | POA: Diagnosis not present

## 2021-01-29 DIAGNOSIS — T40601A Poisoning by unspecified narcotics, accidental (unintentional), initial encounter: Secondary | ICD-10-CM

## 2021-01-29 DIAGNOSIS — R4020 Unspecified coma: Secondary | ICD-10-CM | POA: Diagnosis present

## 2021-01-29 DIAGNOSIS — Z87891 Personal history of nicotine dependence: Secondary | ICD-10-CM | POA: Diagnosis not present

## 2021-01-29 DIAGNOSIS — N898 Other specified noninflammatory disorders of vagina: Secondary | ICD-10-CM | POA: Insufficient documentation

## 2021-01-29 LAB — CBC WITH DIFFERENTIAL/PLATELET
Abs Immature Granulocytes: 0.03 10*3/uL (ref 0.00–0.07)
Basophils Absolute: 0.1 10*3/uL (ref 0.0–0.1)
Basophils Relative: 1 %
Eosinophils Absolute: 0.4 10*3/uL (ref 0.0–0.5)
Eosinophils Relative: 4 %
HCT: 36.6 % (ref 36.0–46.0)
Hemoglobin: 11.9 g/dL — ABNORMAL LOW (ref 12.0–15.0)
Immature Granulocytes: 0 %
Lymphocytes Relative: 18 %
Lymphs Abs: 1.4 10*3/uL (ref 0.7–4.0)
MCH: 25.4 pg — ABNORMAL LOW (ref 26.0–34.0)
MCHC: 32.5 g/dL (ref 30.0–36.0)
MCV: 78.2 fL — ABNORMAL LOW (ref 80.0–100.0)
Monocytes Absolute: 0.6 10*3/uL (ref 0.1–1.0)
Monocytes Relative: 7 %
Neutro Abs: 5.6 10*3/uL (ref 1.7–7.7)
Neutrophils Relative %: 70 %
Platelets: 192 10*3/uL (ref 150–400)
RBC: 4.68 MIL/uL (ref 3.87–5.11)
RDW: 15.1 % (ref 11.5–15.5)
WBC: 8.1 10*3/uL (ref 4.0–10.5)
nRBC: 0 % (ref 0.0–0.2)

## 2021-01-29 LAB — BASIC METABOLIC PANEL
Anion gap: 11 (ref 5–15)
BUN: 14 mg/dL (ref 6–20)
CO2: 20 mmol/L — ABNORMAL LOW (ref 22–32)
Calcium: 8.1 mg/dL — ABNORMAL LOW (ref 8.9–10.3)
Chloride: 105 mmol/L (ref 98–111)
Creatinine, Ser: 0.49 mg/dL (ref 0.44–1.00)
GFR, Estimated: >60 mL/min (ref >60)
Glucose, Bld: 107 mg/dL — ABNORMAL HIGH (ref 70–99)
Potassium: 3.9 mmol/L (ref 3.5–5.1)
Sodium: 136 mmol/L (ref 135–145)

## 2021-01-29 MED ORDER — ONDANSETRON 4 MG PO TBDP: ORAL_TABLET | ORAL | 0 refills | Status: DC

## 2021-01-29 MED ORDER — AZITHROMYCIN 1 G PO PACK
1.0000 g | PACK | Freq: Once | ORAL | Status: AC
  Administered 2021-01-29: 1 g via ORAL
  Filled 2021-01-29: qty 1

## 2021-01-29 MED ORDER — PROCHLORPERAZINE EDISYLATE 10 MG/2ML IJ SOLN
10.0000 mg | Freq: Once | INTRAMUSCULAR | Status: AC
  Administered 2021-01-29: 10 mg via INTRAVENOUS
  Filled 2021-01-29: qty 2

## 2021-01-29 MED ORDER — NALOXONE HCL 4 MG/0.1ML NA LIQD
1.0000 | Freq: Once | NASAL | Status: AC
  Administered 2021-01-29: 1 via NASAL
  Filled 2021-01-29: qty 4

## 2021-01-29 MED ORDER — ONDANSETRON HCL 4 MG/2ML IJ SOLN
4.0000 mg | Freq: Once | INTRAMUSCULAR | Status: AC
  Administered 2021-01-29: 4 mg via INTRAVENOUS
  Filled 2021-01-29: qty 2

## 2021-01-29 MED ORDER — SODIUM CHLORIDE 0.9 % IV SOLN
1.0000 g | Freq: Once | INTRAVENOUS | Status: AC
  Administered 2021-01-29: 1 g via INTRAVENOUS
  Filled 2021-01-29: qty 10

## 2021-01-29 MED ORDER — PROCHLORPERAZINE EDISYLATE 10 MG/2ML IJ SOLN: 10.0000 mg | Freq: Once | INTRAMUSCULAR | Status: DC

## 2021-01-29 NOTE — ED Notes (Signed)
Patient ambulated to the bathroom with stand by and back to her bed without any assistance.

## 2021-01-29 NOTE — ED Notes (Signed)
Sprite soda given to pt per pt request, vss.  Pt a&o.
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
## 2021-01-29 NOTE — ED Notes (Signed)
Lab states they do not have blood work from earlier, resent down lavender and light green tubes. Pt requesting something for nausea, Dr. Adela Lank notified and received verbal order for zofran.

## 2021-01-29 NOTE — Discharge Instructions (Signed)
There is help if you need it.  Please do not use dirty needles, this could cause you a severe infection to your skin, heart or spinal cord.  This could kill you or leave you permanently disabled.  There was a recent study done at [facility] that showed that the risk of death for someone that had unintentionally overdosed on narcotics was as high as 15% in the next year.  This is much higher than most every other medical condition.  [place] [facility]ioid Problem ([facility]STOP) Fixed; mobile; peer-based Chase Holleman [phone] [email or web address] Fixed site exchange at [employer], Wednesdays (2-5pm) and Thursdays (4-8pm). [address] [place], [place] [address] Call or text to arrange mobile and peer exchange, Mondays (1-4pm) and Fridays (4-7pm). Serving [place] [email or web address]  Suboxone clinic: [facility] [address] [place], [address]  [facility] [address] [place], [address]  [facility] [address] [place] Barry [address]  

## 2021-01-29 NOTE — ED Provider Notes (Signed)
[facility]-EMERGENCY DEPT Provider Note   CSN: [id number] Arrival date & time: 01/29/21  1728     History Chief Complaint  Patient presents with  . Drug Overdose    Leah Barry is a 35 y.o. female.  35 yo F with a chief complaints of unresponsiveness.  Was found by EMS with pinpoint pupils and decreased respiratory rate.  Had improvement with Narcan.  Patient is nonverbal on arrival level 5 caveat.  The history is provided by the patient and the EMS personnel.       Past Medical History:  Diagnosis Date  . Anxiety   . Apnea, sleep    no CPAP use, states lost machine during move to [place]  . Bipolar 1 disorder (HCC)   . Bipolar disorder (HCC)    no current med.  . Depression   . History of MRSA infection 2010  . Irritable bowel syndrome (IBS)    no current med.  . Pelvic inflammatory disease   . Scalp laceration    staples to be removed 03/13/2014  . Ulnar fracture 03/09/2014   left    Patient Active Problem List   Diagnosis Date Noted  . Dysmenorrhea 05/28/2014  . GC (gonococcus) 05/28/2014  . Chlamydia contact 05/28/2014  . Pain 04/14/2014  . UTI symptoms 04/14/2014  . Routine general medical examination at a health care facility 09/26/2013  . Candidiasis of vulva and vagina 09/26/2013  . Unspecified symptom associated with female genital organs 09/10/2013  . Abnormal uterine bleeding (AUB) 09/10/2013  . PID (pelvic inflammatory disease) 07/29/2013  . Dysuria 07/29/2013  . Screening examination for venereal disease 07/29/2013  . Chronic PID (chronic pelvic inflammatory disease) 06/30/2013  . Excessive or frequent menstruation 06/30/2013  . Genital herpes, unspecified 06/30/2013  . Unspecified inflammatory disease of female pelvic organs and tissues 03/12/2013    Past Surgical History:  Procedure Laterality Date  . CYST EXCISION     middle of back  . ORIF ULNAR FRACTURE Left 03/16/2014   Procedure: OPEN REDUCTION INTERNAL FIXATION  (ORIF) LEFT ULNAR FRACTURE;  Surgeon: Marlowe Shores, MD;  Location: [facility];  Service: Orthopedics;  Laterality: Left;  left  . OVARIAN CYST REMOVAL    . THYROID CYST EXCISION     nodule exc.  . TUBAL LIGATION       OB History    Gravida  5   Para  4   Term  4   Preterm      AB  1   Living  4     SAB      IAB      Ectopic  1   Multiple      Live Births  4           Family History  Problem Relation Age of Onset  . Heart disease Mother   . Cancer Maternal Grandmother        breast  . Cancer Paternal Grandmother        breast    Social History   Tobacco Use  . Smoking status: Former Smoker    Years: 3.00    Types: Cigars    Quit date: 09/16/2018    Years since quitting: 2.3  . Smokeless tobacco: Never Used  . Tobacco comment: 1-2 cig./day  Vaping Use  . Vaping Use: Never used  Substance Use Topics  . Alcohol use: Not Currently    Alcohol/week: 0.0 standard drinks    Comment:  occ  . Drug use: Not Currently    Home Medications Prior to Admission medications   Medication Sig Start Date End Date Taking? Authorizing Provider  acetaminophen (TYLENOL) 500 MG tablet Take 1 tablet (500 mg total) by mouth every 6 (six) hours as needed. 09/30/18   Carlyle Basques P, PA-C  ibuprofen (ADVIL,MOTRIN) 800 MG tablet Take 1 tablet (800 mg total) by mouth 3 (three) times daily. 09/30/18   Elson Areas, PA-C    Allergies    Latuda [lurasidone hcl], Sulfa antibiotics, Bee venom, Flagyl [metronidazole], Naproxen, and Tramadol  Review of Systems   Review of Systems  Unable to perform ROS: Patient nonverbal  Constitutional: Negative for chills and fever.  HENT: Negative for congestion and rhinorrhea.   Eyes: Negative for redness and visual disturbance.  Respiratory: Negative for shortness of breath and wheezing.   Cardiovascular: Negative for chest pain and palpitations.  Gastrointestinal: Negative for nausea and vomiting.   Genitourinary: Negative for dysuria and urgency.  Musculoskeletal: Negative for arthralgias and myalgias.  Skin: Negative for pallor and wound.  Neurological: Negative for dizziness and headaches.    Physical Exam Updated Vital Signs BP 131/85   Pulse 68   Temp (!) 97.5 F (36.4 C) (Oral)   Resp 16   SpO2 99%   Physical Exam Vitals and nursing note reviewed.  Constitutional:      General: She is not in acute distress.    Appearance: She is well-developed. She is not diaphoretic.  HENT:     Head: Normocephalic and atraumatic.  Eyes:     Pupils: Pupils are equal, round, and reactive to light.  Cardiovascular:     Rate and Rhythm: Normal rate and regular rhythm.     Heart sounds: No murmur heard. No friction rub. No gallop.   Pulmonary:     Effort: Pulmonary effort is normal.     Breath sounds: No wheezing or rales.  Abdominal:     General: There is no distension.     Palpations: Abdomen is soft.     Tenderness: There is no abdominal tenderness.  Musculoskeletal:        General: No tenderness.     Cervical back: Normal range of motion and neck supple.  Skin:    General: Skin is warm and dry.  Neurological:     Comments: Localizes to pain.  Psychiatric:        Behavior: Behavior normal.     ED Results / Procedures / Treatments   Labs (all labs ordered are listed, but only abnormal results are displayed) Labs Reviewed  CBC WITH DIFFERENTIAL/PLATELET - Abnormal; Notable for the following components:      Result Value   Hemoglobin 11.9 (*)    MCV 78.2 (*)    MCH 25.4 (*)    All other components within normal limits  BASIC METABOLIC PANEL - Abnormal; Notable for the following components:   CO2 20 (*)    Glucose, Bld 107 (*)    Calcium 8.1 (*)    All other components within normal limits  CBG MONITORING, ED  GC/CHLAMYDIA PROBE AMP ([place]) NOT AT [facility]    EKG None  Radiology No results found.  Procedures Procedures   Medications Ordered in  ED Medications  cefTRIAXone (ROCEPHIN) 1 g in sodium chloride 0.9 % 100 mL IVPB (has no administration in time range)  azithromycin (ZITHROMAX) powder 1 g (has no administration in time range)  naloxone (NARCAN) nasal spray 4 mg/0.1 mL (has  no administration in time range)  ondansetron [facility]) injection 4 mg (4 mg Intravenous Given 01/29/21 2138)  prochlorperazine (COMPAZINE) injection 10 mg (10 mg Intravenous Given 01/29/21 2246)    ED Course  I have reviewed the triage vital signs and the nursing notes.  Pertinent labs & imaging results that were available during my care of the patient were reviewed by me and considered in my medical decision making (see chart for details).    MDM Rules/Calculators/A&P                          35 yo F with a chief complaint of unresponsiveness.  Found to be unresponsive with respiratory rates about 2-4.  Was given Narcan with improvement.  Now the patient's respiratory rate is normal but is responding only to sternal rub.  Will observe the patient in the ED.  Now awake and alert, ambulatory to the bathroom and back.  Clinically sober.  D/c home.   Patient is requesting STD check.  Offered treatment if she wants.  11:10 PM:  I have discussed the diagnosis/risks/treatment options with the patient and believe the pt to be eligible for discharge home to follow-up with PCP. We also discussed returning to the ED immediately if new or worsening sx occur. We discussed the sx which are most concerning (e.g., sudden worsening pain, fever, inability to tolerate by mouth) that necessitate immediate return. Medications administered to the patient during their visit and any new prescriptions provided to the patient are listed below.  Medications given during this visit Medications  cefTRIAXone (ROCEPHIN) 1 g in sodium chloride 0.9 % 100 mL IVPB (has no administration in time range)  azithromycin (ZITHROMAX) powder 1 g (has no administration in time range)  naloxone  (NARCAN) nasal spray 4 mg/0.1 mL (has no administration in time range)  ondansetron (ZOFRAN) injection 4 mg (4 mg Intravenous Given 01/29/21 2138)  prochlorperazine (COMPAZINE) injection 10 mg (10 mg Intravenous Given 01/29/21 2246)     The patient appears reasonably screen and/or stabilized for discharge and I doubt any other medical condition or other [facility] requiring further screening, evaluation, or treatment in the ED at this time prior to discharge.   Final Clinical Impression(s) / ED Diagnoses Final diagnoses:  Opiate overdose, accidental or unintentional, initial encounter [facility])    Rx / DC Orders ED Discharge Orders    None       Melene Plan, DO 01/29/21 2310

## 2021-01-29 NOTE — ED Notes (Signed)
Attempted to ambulate the patient with no success. Ambulation will be attempted again later.

## 2021-01-30 ENCOUNTER — Emergency Department (HOSPITAL_COMMUNITY)
Admission: EM | Admit: 2021-01-30 | Discharge: 2021-01-30 | Disposition: A | Discharge status: Home / Self Care | Payer: Medicaid Other | Source: Home / Self Care | Attending: Emergency Medicine | Admitting: Emergency Medicine

## 2021-01-30 ENCOUNTER — Emergency Department (HOSPITAL_COMMUNITY): Payer: Medicaid Other

## 2021-01-30 DIAGNOSIS — N898 Other specified noninflammatory disorders of vagina: Secondary | ICD-10-CM | POA: Insufficient documentation

## 2021-01-30 DIAGNOSIS — R300 Dysuria: Secondary | ICD-10-CM | POA: Insufficient documentation

## 2021-01-30 DIAGNOSIS — Z87891 Personal history of nicotine dependence: Secondary | ICD-10-CM | POA: Insufficient documentation

## 2021-01-30 DIAGNOSIS — R079 Chest pain, unspecified: Secondary | ICD-10-CM | POA: Insufficient documentation

## 2021-01-30 LAB — URINALYSIS, ROUTINE W REFLEX MICROSCOPIC
Bacteria, UA: NONE SEEN
Bilirubin Urine: NEGATIVE
Glucose, UA: NEGATIVE mg/dL
Ketones, ur: 20 mg/dL — AB
Leukocytes,Ua: NEGATIVE
Nitrite: NEGATIVE
Protein, ur: 30 mg/dL — AB
Specific Gravity, Urine: 1.031 — ABNORMAL HIGH (ref 1.005–1.030)
pH: 5 (ref 5.0–8.0)

## 2021-01-30 LAB — RAPID URINE DRUG SCREEN, HOSP PERFORMED
Amphetamines: NOT DETECTED
Barbiturates: NOT DETECTED
Benzodiazepines: NOT DETECTED
Cocaine: POSITIVE — AB
Opiates: NOT DETECTED
Tetrahydrocannabinol: NOT DETECTED

## 2021-01-30 LAB — PREGNANCY, URINE: Preg Test, Ur: NEGATIVE

## 2021-01-30 MED ORDER — NALOXONE HCL 0.4 MG/ML IJ SOLN
0.1000 mg | Freq: Once | INTRAMUSCULAR | Status: AC
  Administered 2021-01-30: 0.1 mg via INTRAMUSCULAR
  Filled 2021-01-30: qty 1

## 2021-01-30 MED ORDER — NALOXONE HCL 4 MG/0.1ML NA LIQD: 1.0000 | Freq: Once | NASAL | Status: DC

## 2021-01-30 NOTE — ED Notes (Signed)
Patient on the phone in the room at this time.

## 2021-01-30 NOTE — ED Notes (Signed)
Patient ambulatory with standby assistance to restroom. 

## 2021-01-30 NOTE — ED Notes (Signed)
Patient found asleep on toilet in triage restroom. Write directed patient back to room.

## 2021-01-30 NOTE — ED Notes (Addendum)
Patient's step mom, Almira Coaster, called for transport per patient request. Reports she is on the way.

## 2021-01-30 NOTE — ED Notes (Signed)
Patient given more graham crackers upon request.

## 2021-01-30 NOTE — ED Notes (Signed)
Patient assisted to car with wheel chair. 

## 2021-01-30 NOTE — Discharge Instructions (Addendum)
You were seen here for dysuria, your urine looked unremarkable, you were treated for gonorrhea chlamydia at your last visit.  I recommend continuing your medications as prescribed.  I want you to follow-up with OB/GYN for further evaluation.  Given you the contact information above please call.  also given you information for outpatient counseling please read  Come back to the emergency department if you develop chest pain, shortness of breath, severe abdominal pain, uncontrolled nausea, vomiting, diarrhea.

## 2021-01-30 NOTE — ED Triage Notes (Signed)
Patient sitting in chair with eyes closed, presumed to be sleeping. Difficult to triage at this time. Patient only states she has had vaginal burning and discharge for one week. Patient discharged from ED last night with other complaints.

## 2021-01-30 NOTE — ED Notes (Signed)
Patient returned to treatment from restroom independently.

## 2021-01-30 NOTE — ED Notes (Signed)
Patient given sandwich, graham crackers, and sprite.

## 2021-01-30 NOTE — ED Notes (Signed)
Pt provided with a change of clothes. She ambulated out of the department with her d/c papers, narcan, and belongings.

## 2021-01-30 NOTE — ED Provider Notes (Signed)
[facility]-EMERGENCY DEPT Provider Note   CSN: [id number] Arrival date & time: 01/30/21  [id number]     History Chief Complaint  Patient presents with  . Vaginal Discharge    Leah Barry is a 35 y.o. female.  HPI   HPI will be for due to level 5 caveat nonverbal   Patient with significant medical history of anxiety, sleep apnea, bipolar, polysubstance abuse presents emergency department with chief complaint of vaginal discharge and burning sensation with urination.  On my exam patient was somnolent, arousable but will go back to sleep.  She cannot tell me very much information.  She states that she had  slight chest pain as she was "revived after an OD".  She also endorses that she has had 2 weeks of vaginal discharge and burning station with urination.    After reviewing patient's chart patient was recent seen here yesterday for chief complaint of unresponsiveness, was found by EMS pinpoint pupils, decreased respiratory rate, she improved with Narcan.  After she metabolized patient was back to her baseline, she endorsed that she want STD check and was provided with ceftriaxone and azithromycin, self swab for gonorrhea and chlamydia.  Past Medical History:  Diagnosis Date  . Anxiety   . Apnea, sleep    no CPAP use, states lost machine during move to [place]  . Bipolar 1 disorder (HCC)   . Bipolar disorder (HCC)    no current med.  . Depression   . History of MRSA infection 2010  . Irritable bowel syndrome (IBS)    no current med.  . Pelvic inflammatory disease   . Scalp laceration    staples to be removed 03/13/2014  . Ulnar fracture 03/09/2014   left    Patient Active Problem List   Diagnosis Date Noted  . Dysmenorrhea 05/28/2014  . GC (gonococcus) 05/28/2014  . Chlamydia contact 05/28/2014  . Pain 04/14/2014  . UTI symptoms 04/14/2014  . Routine general medical examination at a health care facility 09/26/2013  . Candidiasis of vulva and vagina  09/26/2013  . Unspecified symptom associated with female genital organs 09/10/2013  . Abnormal uterine bleeding (AUB) 09/10/2013  . PID (pelvic inflammatory disease) 07/29/2013  . Dysuria 07/29/2013  . Screening examination for venereal disease 07/29/2013  . Chronic PID (chronic pelvic inflammatory disease) 06/30/2013  . Excessive or frequent menstruation 06/30/2013  . Genital herpes, unspecified 06/30/2013  . Unspecified inflammatory disease of female pelvic organs and tissues 03/12/2013    Past Surgical History:  Procedure Laterality Date  . CYST EXCISION     middle of back  . ORIF ULNAR FRACTURE Left 03/16/2014   Procedure: OPEN REDUCTION INTERNAL FIXATION (ORIF) LEFT ULNAR FRACTURE;  Surgeon: Marlowe ShoresMatthew A Weingold, MD;  Location: [facility];  Service: Orthopedics;  Laterality: Left;  left  . OVARIAN CYST REMOVAL    . THYROID CYST EXCISION     nodule exc.  . TUBAL LIGATION       OB History    Gravida  5   Para  4   Term  4   Preterm      AB  1   Living  4     SAB      IAB      Ectopic  1   Multiple      Live Births  4           Family History  Problem Relation Age of Onset  . Heart disease Mother   .  Cancer Maternal Grandmother        breast  . Cancer Paternal Grandmother        breast    Social History   Tobacco Use  . Smoking status: Former Smoker    Years: 3.00    Types: Cigars    Quit date: 09/16/2018    Years since quitting: 2.3  . Smokeless tobacco: Never Used  . Tobacco comment: 1-2 cig./day  Vaping Use  . Vaping Use: Never used  Substance Use Topics  . Alcohol use: Not Currently    Alcohol/week: 0.0 standard drinks    Comment: occ  . Drug use: Not Currently    Home Medications Prior to Admission medications   Medication Sig Start Date End Date Taking? Authorizing Provider  acetaminophen (TYLENOL) 500 MG tablet Take 1 tablet (500 mg total) by mouth every 6 (six) hours as needed. 09/30/18   Carlyle Basques P, PA-C   ibuprofen (ADVIL,MOTRIN) 800 MG tablet Take 1 tablet (800 mg total) by mouth 3 (three) times daily. 09/30/18   Elson Areas, PA-C  ondansetron (ZOFRAN ODT) 4 MG disintegrating tablet 4mg  ODT q4 hours prn nausea/vomit 01/29/21   01/31/21, DO    Allergies    Melene Plan [lurasidone hcl], Sulfa antibiotics, Bee venom, Flagyl [metronidazole], Naproxen, and Tramadol  Review of Systems   Review of Systems  Unable to perform ROS: Patient nonverbal    Physical Exam Updated Vital Signs BP (!) 146/90   Pulse 79   Temp (!) 97.5 F (36.4 C) (Oral)   Resp 20   SpO2 97%   Physical Exam Vitals and nursing note reviewed.  Constitutional:      General: She is not in acute distress.    Appearance: She is not ill-appearing.     Comments: Patient is somnolent on my exam, but is arousable, she is maintaining her airway.  HENT:     Head: Normocephalic and atraumatic.     Nose: No congestion.  Eyes:     Extraocular Movements: Extraocular movements intact.     Conjunctiva/sclera: Conjunctivae normal.     Comments: Pupils are noted to be pinpoint.  Cardiovascular:     Rate and Rhythm: Normal rate and regular rhythm.     Pulses: Normal pulses.     Heart sounds: No murmur heard. No friction rub. No gallop.   Pulmonary:     Effort: No respiratory distress.     Breath sounds: No wheezing, rhonchi or rales.  Abdominal:     Palpations: Abdomen is soft.     Tenderness: There is no abdominal tenderness.  Musculoskeletal:     Right lower leg: No edema.     Left lower leg: No edema.     Comments: Patient is moving all 4 extremities at difficulty.  Patient had no difficulty with ambulation  Skin:    General: Skin is warm and dry.  Neurological:     Mental Status: She is alert.     Comments: No difficulty with word finding  No facially asymmetry noted  Psychiatric:        Mood and Affect: Mood normal.     ED Results / Procedures / Treatments   Labs (all labs ordered are listed, but only  abnormal results are displayed) Labs Reviewed  URINALYSIS, ROUTINE W REFLEX MICROSCOPIC - Abnormal; Notable for the following components:      Result Value   Specific Gravity, Urine 1.031 (*)    Hgb urine dipstick SMALL (*)  Ketones, ur 20 (*)    Protein, ur 30 (*)    All other components within normal limits  RAPID URINE DRUG SCREEN, HOSP PERFORMED - Abnormal; Notable for the following components:   Cocaine POSITIVE (*)    All other components within normal limits  PREGNANCY, URINE    EKG None  Radiology DG Chest Port 1 View  Result Date: 01/30/2021 CLINICAL DATA:  Chest pain EXAM: PORTABLE CHEST 1 VIEW COMPARISON:  Chest radiograph dated 09/30/2018. FINDINGS: The heart size and mediastinal contours are within normal limits. Both lungs are clear. The visualized skeletal structures are unremarkable. IMPRESSION: No active disease. Electronically Signed   By: Romona Curls M.D.   On: 01/30/2021 15:09    Procedures Procedures   Medications Ordered in ED Medications  naloxone [facility]) injection 0.1 mg (0.1 mg Intramuscular Given 01/30/21 1729)  naloxone [facility]) injection 0.1 mg (0.1 mg Intramuscular Given 01/30/21 1805)    ED Course  I have reviewed the triage vital signs and the nursing notes.  Pertinent labs & imaging results that were available during my care of the patient were reviewed by me and considered in my medical decision making (see chart for details).    MDM Rules/Calculators/A&P                         Initial impression-patient presents with a multitude of complaints, she was somnolent, but arousable, vital signs reassuring, will obtain chest x-ray, EKG, UA, rapid urine drug screen and let patient metabolize before reassessment  Work-up-chest x-ray is unremarkable.  UA negative for leukocytes or nitrates, no hematuria present.  Urine pregnancy negative, rapid drug screen positive for cocaine.  Reassessment patient is reassessed, continues to be somnolent,  still arousal, maintaining airway.  Will continue to let patient metabolize and reassess  1621-patient is reassessed continues to be somnolent, still is arousable, maintaining airway.  1647-patient is reassessed continues to be somnolent, still is arousable, maintaining airway.  1715 patient reassessed slightly more awake but unable to articulate why she is here.  Will go back to sleep after a few words, maintaining airway, will provide patient with 0.1 mg of Narcan for overdose  And reassess  1745-patient was reassessed after providing her with point 0.1 mg of Narcan, continues to be somnolent.  Will try increasing dose 0.1 and reassess  1815 after second dose of 0.1 Narcan patient is awake, is ambulating, patient was able to urinate.  Has no other complaints at this time.   Rule out-I have low suspicion for systemic infection as patient is nontoxic-appearing, vital signs reassuring, no obvious source infection on my exam. low Suspicion for intracranial head bleed or CVA as patient has no neuro deficits on my exam, no difficulty with word finding. low Suspicion for ACS patient denies chest pain, shortness of breath, EKG sinus rhythm without signs of ischemia, chest x-ray unremarkable.  Low suspicion for UTI or pyelonephritis as UA is negative for signs of infection.  Low suspicion for kidney stones as patient had no CVA tenderness, no hematuria noted on UA.  Low suspicion for PID patient had no lower pelvic pain, there is no sign infection on UA.  Patient was just treated for gonorrhea and chlamydia yesterday.  Low suspicion form ectopic pregnancy as urine pregnancy is negative.  Plan-patient has dysuria which I suspect is secondary due to STI.  Patient was treated yesterday, anticipate symptoms will decrease within the next couple days.  We will have her  follow-up with OB/GYN for further evaluation.  Vital signs have remained stable, no indication for hospital admission.  Patient given at home  care as well strict return precautions.  Patient verbalized that they understood agreed to said plan.   Final Clinical Impression(s) / ED Diagnoses Final diagnoses:  Dysuria    Rx / DC Orders ED Discharge Orders    None       Barnie Del 01/30/21 Derald Macleod, MD 01/31/21 1438

## 2021-01-31 LAB — GC/CHLAMYDIA PROBE AMP (~~LOC~~) NOT AT ARMC
Chlamydia: POSITIVE — AB
Comment: NEGATIVE
Comment: NORMAL
Neisseria Gonorrhea: POSITIVE — AB

## 2021-02-23 ENCOUNTER — Ambulatory Visit: Payer: Self-pay | Admitting: *Deleted

## 2021-02-23 NOTE — Telephone Encounter (Signed)
Patient calling to review AVS from last visit in ED on 01/29/21 and 01/30/21. Patient requesting  To know where her sutures are located. See documentation to have patient to follow up 02/07/21 at [facility] for sutural removal. Reviewed ED notes from 01/29/21/and 01/30/21 and did not see documentation of sutures or where sutures were located. Instructed patient to follow up as directed on 01/30/21 and go to [facility] urgent care and follow up with community health and wellness. Patient reports she will call urgent care    Reason for Disposition . [1] Caller requesting NON-URGENT health information AND [2] PCP's office is the best resource  Answer Assessment - Initial Assessment Questions 1. REASON FOR CALL or QUESTION: "What is your reason for calling today?" or "How can I best help you?" or "What question do you have that I can help answer?"     Patient requesting to review discharge instructions from ED visit from 01/29/21 and 01/30/21.  Protocols used: INFORMATION ONLY CALL - NO TRIAGE-A-AH

## 2021-05-24 ENCOUNTER — Emergency Department (HOSPITAL_COMMUNITY): Payer: Medicaid Other

## 2021-05-24 ENCOUNTER — Emergency Department (HOSPITAL_COMMUNITY)
Admission: EM | Admit: 2021-05-24 | Discharge: 2021-05-24 | Disposition: A | Discharge status: Home / Self Care | Payer: Medicaid Other | Attending: Emergency Medicine | Admitting: Emergency Medicine

## 2021-05-24 ENCOUNTER — Other Ambulatory Visit: Payer: Self-pay

## 2021-05-24 DIAGNOSIS — T24032A Burn of unspecified degree of left lower leg, initial encounter: Secondary | ICD-10-CM | POA: Diagnosis not present

## 2021-05-24 DIAGNOSIS — R102 Pelvic and perineal pain: Secondary | ICD-10-CM | POA: Insufficient documentation

## 2021-05-24 DIAGNOSIS — T300 Burn of unspecified body region, unspecified degree: Secondary | ICD-10-CM

## 2021-05-24 DIAGNOSIS — Y92510 Bank as the place of occurrence of the external cause: Secondary | ICD-10-CM | POA: Diagnosis not present

## 2021-05-24 DIAGNOSIS — W19XXXA Unspecified fall, initial encounter: Secondary | ICD-10-CM | POA: Insufficient documentation

## 2021-05-24 DIAGNOSIS — T50904A Poisoning by unspecified drugs, medicaments and biological substances, undetermined, initial encounter: Secondary | ICD-10-CM | POA: Diagnosis not present

## 2021-05-24 DIAGNOSIS — Z87891 Personal history of nicotine dependence: Secondary | ICD-10-CM | POA: Insufficient documentation

## 2021-05-24 DIAGNOSIS — Z23 Encounter for immunization: Secondary | ICD-10-CM | POA: Diagnosis not present

## 2021-05-24 LAB — I-STAT CHEM 8, ED
BUN: 9 mg/dL (ref 6–20)
Calcium, Ion: 1.14 mmol/L — ABNORMAL LOW (ref 1.15–1.40)
Chloride: 103 mmol/L (ref 98–111)
Creatinine, Ser: 0.4 mg/dL — ABNORMAL LOW (ref 0.44–1.00)
Glucose, Bld: 97 mg/dL (ref 70–99)
HCT: 35 % — ABNORMAL LOW (ref 36.0–46.0)
Hemoglobin: 11.9 g/dL — ABNORMAL LOW (ref 12.0–15.0)
Potassium: 3.4 mmol/L — ABNORMAL LOW (ref 3.5–5.1)
Sodium: 141 mmol/L (ref 135–145)
TCO2: 26 mmol/L (ref 22–32)

## 2021-05-24 LAB — I-STAT BETA HCG BLOOD, ED (MC, WL, AP ONLY): I-stat hCG, quantitative: <5 m[IU]/mL (ref <5)

## 2021-05-24 MED ORDER — BACITRACIN ZINC 500 UNIT/GM EX OINT: 1.0000 "application " | TOPICAL_OINTMENT | Freq: Two times a day (BID) | CUTANEOUS | 0 refills | Status: DC

## 2021-05-24 MED ORDER — BACITRACIN ZINC 500 UNIT/GM EX OINT
TOPICAL_OINTMENT | Freq: Two times a day (BID) | CUTANEOUS | Status: DC
  Administered 2021-05-24: 1 via TOPICAL
  Filled 2021-05-24: qty 4.5

## 2021-05-24 MED ORDER — NALOXONE HCL 4 MG/0.1ML NA LIQD: NASAL | 1 refills | Status: DC

## 2021-05-24 MED ORDER — TETANUS-DIPHTH-ACELL PERTUSSIS 5-2.5-18.5 LF-MCG/0.5 IM SUSY
0.5000 mL | PREFILLED_SYRINGE | Freq: Once | INTRAMUSCULAR | Status: AC
  Administered 2021-05-24: 0.5 mL via INTRAMUSCULAR
  Filled 2021-05-24: qty 0.5

## 2021-05-24 NOTE — ED Triage Notes (Signed)
Per EMS- Patient was found on the ground in the teller line at [place]. Patient was unresponsive and was given Narcan 4 mg by [employer]. Patient is lethargic, but arouses with stimuli.
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
## 2021-05-28 ENCOUNTER — Emergency Department (HOSPITAL_COMMUNITY): Payer: Medicaid Other

## 2021-05-28 ENCOUNTER — Emergency Department (HOSPITAL_COMMUNITY)
Admission: EM | Admit: 2021-05-28 | Discharge: 2021-05-28 | Disposition: A | Discharge status: Home / Self Care | Payer: Medicaid Other | Attending: Emergency Medicine | Admitting: Emergency Medicine

## 2021-05-28 DIAGNOSIS — R1084 Generalized abdominal pain: Secondary | ICD-10-CM | POA: Insufficient documentation

## 2021-05-28 DIAGNOSIS — R109 Unspecified abdominal pain: Secondary | ICD-10-CM | POA: Diagnosis present

## 2021-05-28 DIAGNOSIS — N12 Tubulo-interstitial nephritis, not specified as acute or chronic: Secondary | ICD-10-CM

## 2021-05-28 DIAGNOSIS — O0091 Unspecified ectopic pregnancy with intrauterine pregnancy: Secondary | ICD-10-CM | POA: Insufficient documentation

## 2021-05-28 DIAGNOSIS — Z87891 Personal history of nicotine dependence: Secondary | ICD-10-CM | POA: Diagnosis not present

## 2021-05-28 DIAGNOSIS — Z79899 Other long term (current) drug therapy: Secondary | ICD-10-CM | POA: Insufficient documentation

## 2021-05-28 LAB — COMPREHENSIVE METABOLIC PANEL
ALT: 12 U/L (ref 0–44)
AST: 16 U/L (ref 15–41)
Albumin: 3.8 g/dL (ref 3.5–5.0)
Alkaline Phosphatase: 78 U/L (ref 38–126)
Anion gap: 10 (ref 5–15)
BUN: 8 mg/dL (ref 6–20)
CO2: 24 mmol/L (ref 22–32)
Calcium: 9.1 mg/dL (ref 8.9–10.3)
Chloride: 102 mmol/L (ref 98–111)
Creatinine, Ser: 0.56 mg/dL (ref 0.44–1.00)
GFR, Estimated: >60 mL/min (ref >60)
Glucose, Bld: 112 mg/dL — ABNORMAL HIGH (ref 70–99)
Potassium: 3.9 mmol/L (ref 3.5–5.1)
Sodium: 136 mmol/L (ref 135–145)
Total Bilirubin: 0.3 mg/dL (ref 0.3–1.2)
Total Protein: 8 g/dL (ref 6.5–8.1)

## 2021-05-28 LAB — I-STAT BETA HCG BLOOD, ED (MC, WL, AP ONLY): I-stat hCG, quantitative: <5 m[IU]/mL

## 2021-05-28 LAB — CBC WITH DIFFERENTIAL/PLATELET
Abs Immature Granulocytes: 0.11 10*3/uL — ABNORMAL HIGH (ref 0.00–0.07)
Basophils Absolute: 0 10*3/uL (ref 0.0–0.1)
Basophils Relative: 0 %
Eosinophils Absolute: 0.2 10*3/uL (ref 0.0–0.5)
Eosinophils Relative: 1 %
HCT: 37.8 % (ref 36.0–46.0)
Hemoglobin: 12.2 g/dL (ref 12.0–15.0)
Immature Granulocytes: 1 %
Lymphocytes Relative: 7 %
Lymphs Abs: 1.3 10*3/uL (ref 0.7–4.0)
MCH: 23.6 pg — ABNORMAL LOW (ref 26.0–34.0)
MCHC: 32.3 g/dL (ref 30.0–36.0)
MCV: 73.1 fL — ABNORMAL LOW (ref 80.0–100.0)
Monocytes Absolute: 1.5 10*3/uL — ABNORMAL HIGH (ref 0.1–1.0)
Monocytes Relative: 8 %
Neutro Abs: 16.3 10*3/uL — ABNORMAL HIGH (ref 1.7–7.7)
Neutrophils Relative %: 83 %
Platelets: 390 10*3/uL (ref 150–400)
RBC: 5.17 MIL/uL — ABNORMAL HIGH (ref 3.87–5.11)
RDW: 18.4 % — ABNORMAL HIGH (ref 11.5–15.5)
WBC: 19.5 10*3/uL — ABNORMAL HIGH (ref 4.0–10.5)
nRBC: 0 % (ref 0.0–0.2)

## 2021-05-28 LAB — URINALYSIS, ROUTINE W REFLEX MICROSCOPIC
Bilirubin Urine: NEGATIVE
Glucose, UA: NEGATIVE mg/dL
Hgb urine dipstick: NEGATIVE
Ketones, ur: 20 mg/dL — AB
Leukocytes,Ua: NEGATIVE
Nitrite: NEGATIVE
Protein, ur: NEGATIVE mg/dL
Specific Gravity, Urine: >1.046 — ABNORMAL HIGH (ref 1.005–1.030)
pH: 6 (ref 5.0–8.0)

## 2021-05-28 LAB — RAPID URINE DRUG SCREEN, HOSP PERFORMED
Amphetamines: NOT DETECTED
Barbiturates: NOT DETECTED
Benzodiazepines: NOT DETECTED
Cocaine: POSITIVE — AB
Opiates: NOT DETECTED
Tetrahydrocannabinol: NOT DETECTED

## 2021-05-28 LAB — LIPASE, BLOOD: Lipase: 25 U/L (ref 11–51)

## 2021-05-28 LAB — ETHANOL: Alcohol, Ethyl (B): <10 mg/dL (ref <10)

## 2021-05-28 MED ORDER — CEFPODOXIME PROXETIL 200 MG PO TABS: 200.0000 mg | ORAL_TABLET | Freq: Two times a day (BID) | ORAL | 0 refills | Status: AC

## 2021-05-28 MED ORDER — SODIUM CHLORIDE 0.9 % IV BOLUS
1000.0000 mL | Freq: Once | INTRAVENOUS | Status: AC
  Administered 2021-05-28: 1000 mL via INTRAVENOUS

## 2021-05-28 MED ORDER — ONDANSETRON HCL 4 MG/2ML IJ SOLN
4.0000 mg | Freq: Once | INTRAMUSCULAR | Status: AC
  Administered 2021-05-28: 4 mg via INTRAVENOUS
  Filled 2021-05-28: qty 2

## 2021-05-28 MED ORDER — IOHEXOL 300 MG/ML  SOLN
100.0000 mL | Freq: Once | INTRAMUSCULAR | Status: AC | PRN
  Administered 2021-05-28: 100 mL via INTRAVENOUS

## 2021-05-28 MED ORDER — SODIUM CHLORIDE 0.9 % IV SOLN
2.0000 g | Freq: Once | INTRAVENOUS | Status: AC
  Administered 2021-05-28: 2 g via INTRAVENOUS
  Filled 2021-05-28: qty 20

## 2021-05-28 NOTE — ED Notes (Signed)
She has refused her CT scan for the second.

## 2021-05-28 NOTE — ED Provider Notes (Addendum)
Leah Barry is a 35 y.o. female, presenting to the ED with right or left sided flank/abdominal pain, depending on when you ask the patient.  HPI from Army Melia, PA-C: "35 year old female brought in by EMS from motel where she called in for right-sided flank pain.  Patient admitted to illicit substance use prior to EMS arrival and was sleepy, complicated historian, level 5 caveat applies.  Patient states that she either has an ectopic pregnancy or kidney infection but will not answer any other questions. Patient was seen in this emergency room 4 days ago after overdose, hCG negative at that time."  Past Medical History:  Diagnosis Date   Anxiety    Apnea, sleep    no CPAP use, states lost machine during move to [place]   Bipolar 1 disorder (HCC)    Bipolar disorder (HCC)    no current med.   Depression    History of MRSA infection 2010   Irritable bowel syndrome (IBS)    no current med.   Pelvic inflammatory disease    Scalp laceration    staples to be removed 03/13/2014   Ulnar fracture 03/09/2014   left    Physical Exam  BP (!) 138/98 (BP Location: Left Arm)   Pulse 72   Temp 98.2 F (36.8 C) (Oral)   Resp 16   SpO2 94%   Physical Exam Vitals and nursing note reviewed.  Constitutional:      General: She is not in acute distress.    Appearance: She is well-developed. She is not diaphoretic.  HENT:     Head: Normocephalic and atraumatic.     Mouth/Throat:     Mouth: Mucous membranes are moist.     Pharynx: Oropharynx is clear.  Eyes:     Conjunctiva/sclera: Conjunctivae normal.  Cardiovascular:     Rate and Rhythm: Normal rate and regular rhythm.     Pulses: Normal pulses.          Radial pulses are 2+ on the right side and 2+ on the left side.       Posterior tibial pulses are 2+ on the right side and 2+ on the left side.     Heart sounds: Normal heart sounds.     Comments: Tactile temperature in the extremities appropriate and equal bilaterally. Pulmonary:      Effort: Pulmonary effort is normal. No respiratory distress.     Breath sounds: Normal breath sounds.  Abdominal:     Palpations: Abdomen is soft.     Tenderness: There is no abdominal tenderness. There is no guarding.  Musculoskeletal:     Cervical back: Neck supple.     Right lower leg: No edema.     Left lower leg: No edema.  Skin:    General: Skin is warm and dry.  Psychiatric:        Mood and Affect: Mood and affect normal.        Speech: Speech normal.        Behavior: Behavior normal.    ED Course/Procedures     Procedures  Abnormal Labs Reviewed  CBC WITH DIFFERENTIAL/PLATELET - Abnormal; Notable for the following components:      Result Value   WBC 19.5 (*)    RBC 5.17 (*)    MCV 73.1 (*)    MCH 23.6 (*)    RDW 18.4 (*)    Neutro Abs 16.3 (*)    Monocytes Absolute 1.5 (*)    Abs Immature Granulocytes 0.11 (*)  All other components within normal limits  COMPREHENSIVE METABOLIC PANEL - Abnormal; Notable for the following components:   Glucose, Bld 112 (*)    All other components within normal limits  URINALYSIS, ROUTINE W REFLEX MICROSCOPIC - Abnormal; Notable for the following components:   Specific Gravity, Urine >1.046 (*)    Ketones, ur 20 (*)    All other components within normal limits  RAPID URINE DRUG SCREEN, HOSP PERFORMED - Abnormal; Notable for the following components:   Cocaine POSITIVE (*)    All other components within normal limits    CT Abdomen Pelvis W Contrast  Result Date: 05/28/2021 CLINICAL DATA:  Abdominal and flank pain EXAM: CT ABDOMEN AND PELVIS WITH CONTRAST TECHNIQUE: Multidetector CT imaging of the abdomen and pelvis was performed using the standard protocol following bolus administration of intravenous contrast. CONTRAST:  OMNIPAQUE IOHEXOL 300 MG/ML  SOLN COMPARISON:  01/31/2017 FINDINGS: Lower chest: Hypoventilatory changes are seen at the lung bases. Hepatobiliary: Gallbladder is unremarkable without cholelithiasis or  cholecystitis. Mild periportal edema is nonspecific. No intrahepatic biliary duct dilation. No focal liver abnormalities. Pancreas: Unremarkable. No pancreatic ductal dilatation or surrounding inflammatory changes. Spleen: Normal in size without focal abnormality. Adrenals/Urinary Tract: There are subtle areas of decreased cortical enhancement within the left kidney, which is mildly enlarged relative to the right. Findings could reflect pyelonephritis. Please correlate with urinalysis. No urinary tract calculi or obstructive uropathy. The adrenals and bladder are unremarkable. Stomach/Bowel: No bowel obstruction or ileus. Normal appendix right lower quadrant. No bowel wall thickening or inflammatory change. Vascular/Lymphatic: No significant vascular findings are present. No enlarged abdominal or pelvic lymph nodes. Reproductive: Uterus and bilateral adnexa are unremarkable. Other: No free fluid or free gas.  No abdominal wall hernia. Musculoskeletal: No acute or destructive bony lesions. Reconstructed images demonstrate no additional findings. IMPRESSION: 1. Mild enlargement of the left kidney with subtle areas of decreased cortical enhancement, suspicious for left-sided pyelonephritis. Please correlate with urinalysis. 2. Periportal edema, which can be associated with acute pyelonephritis. Additionally, cholangitis or hepatitis could give this appearance. Please correlate with liver function tests. Electronically Signed   By: Sharlet Salina M.D.   On: 05/28/2021 17:16    MDM    Clinical Course as of 05/28/21 2215  Sat May 28, 2021  6860 35 year old female presents emergency room with complaint of right-sided abdominal pain.  Patient thinks that she might have a kidney infection or an ectopic pregnancy. Patient had admitted to drug use by EMS, is intermittently cooperative. Found to have generalized abdominal tenderness. CBC with elevated white blood cell count 19.5 with increase in neutrophils, CMP  without significant findings, lipase and normal meds, alcohol negative.  Hcg negative. Patient has not provided a urine sample. Patient was taken to CT scan, refused exam and returned to the room. Discussed available results with patient, recommend CT scan due to her abdominal pain with increased white blood cell count.  Patient is now agreeable to completing the study. [LM]  1535 Care signed out to on coming provider pending CT and urine.  [LM]  1555 Patient was taken to CT and again refused CT scan. Advised patient if she is going to refuse care today that is her choice however we are unable to diagnose the source of her pain and elevated white blood cell count without further studies. Patient is choosing to refuse care and therefore may go AGAINST MEDICAL ADVICE. [LM]  1605 Patient is spoken with charge nurse and security.  Agrees to have  her CT scan and agrees that she will not swing at the CT staff again. Care signed out pending CT and urinalysis. [LM]    Clinical Course User Index [LM] Jeannie Fend, PA-C    Patient care handoff report received from Army Melia, PA-C. Plan: Patient to receive CT abdomen/pelvis.  Patient presented complaining of flank pain.  She would not give me a clear answer as to what side of her body her flank pain was on.  For previous provider she was apparently tender throughout the abdomen. Patient is nontoxic appearing, afebrile, not tachycardic, not tachypneic, not hypotensive, maintains excellent SPO2 on room air, and is in no apparent distress.   I have reviewed the patient's chart to obtain more information.   I reviewed and interpreted the patient's labs and radiological studies.  Benign abdominal exam on my assessments. Patient only mildly cooperative throughout her ED course. CT with evidence of possible pyelonephritis, though UA rather unremarkable other than ketonuria. Patient ambulated from the department without need for assistance. Findings  and plan of care discussed with attending physician, Cathren Laine, MD. Dr. Denton Lank personally evaluated and examined this patient.  Vitals:   05/28/21 1057 05/28/21 1316 05/28/21 1728 05/28/21 2058  BP: (!) 137/99 (!) 138/98 135/86 131/87  Pulse: 76 72 70 81  Resp: 14 16 18 16   Temp: 98.3 F (36.8 C) 98.2 F (36.8 C) 98.1 F (36.7 C)   TempSrc: Oral Oral Oral   SpO2: 93% 94% 94% 96%        , PA-C 05/28/21 2215    2216, PA-C 05/28/21 2215    2216, MD 05/29/21 1416

## 2021-05-28 NOTE — Discharge Instructions (Addendum)
Pyelonephritis ° °There is evidence of an infection in the kidney called pyelonephritis. ° °Pain/Fever:  °Antiinflammatory medications: Take 600 mg of ibuprofen every 6 hours or 440 mg (over the counter dose) to 500 mg (prescription dose) of naproxen every 12 hours for the next 3 days. After this time, these medications may be used as needed for pain. Take these medications with food to avoid upset stomach. Choose only one of these medications, do not take them together. °Acetaminophen (generic for Tylenol): Should you continue to have additional pain while taking the ibuprofen or naproxen, you may add in acetaminophen as needed. Your daily total maximum amount of acetaminophen from all sources should be limited to 4000mg/day for persons without liver problems, or 2000mg/day for those with liver problems. ° °Nausea/vomiting: Use the ondansetron (generic for Zofran) for nausea or vomiting.  °This medication may not prevent all vomiting or nausea, but can help facilitate better hydration. °Things that can help with nausea/vomiting also include peppermint/menthol candies, vitamin B12, and ginger. ° °Antibiotics: Please take all of your antibiotics until finished!   You may develop abdominal discomfort or diarrhea from the antibiotic.  You may help offset this with probiotics which you can buy or get in yogurt. Do not eat or take the probiotics until 2 hours after your antibiotic.  ° °Hydration: Symptoms of any other illness will be intensified and complicated by dehydration. Dehydration can also extend the duration of symptoms. Dehydration typically causes its own symptoms including lightheadedness, nausea, headaches, fatigue increased thirst, and generally feeling unwell. Drink plenty of fluids and get plenty of rest. You should be drinking at least half a liter of water every hour or two to stay hydrated. Electrolyte drinks (ex. Gatorade, Powerade, Pedialyte) are also encouraged. You should be drinking enough fluids  to make your urine light yellow, almost clear. If this is not the case, you are not drinking enough water. ° °Follow-up: Most instances of pyelonephritis may be followed up upon by a primary care provider.  Should symptoms fail to begin to resolve after a few days or they fail to resolve by the end of the antibiotic course, follow-up with a urologist. ° °Return: Return to the emergency department for significantly worsening pain, inability or significant difficulty urinating, uncontrolled vomiting, spreading pain, or any other major concerns. °

## 2021-05-28 NOTE — ED Notes (Signed)
Patient transported to CT 

## 2021-05-28 NOTE — ED Notes (Signed)
She went to CT but refused to have the scan done.

## 2021-05-28 NOTE — ED Notes (Signed)
She walked to the restroom but was not able to make a urine sample at this time. IV started and lying back in her bed.

## 2021-05-28 NOTE — ED Provider Notes (Addendum)
[facility] EMERGENCY DEPARTMENT Provider Note   CSN: [id number] Arrival date & time: 05/28/21  1053     History Chief Complaint  Patient presents with   Flank Pain    Leah Barry is a 35 y.o. female.  35 year old female brought in by EMS from motel where she called in for right-sided flank pain.  Patient admitted to illicit substance use prior to EMS arrival and was sleepy, complicated historian, level 5 caveat applies.  Patient states that she either has an ectopic pregnancy or kidney infection but will not answer any other questions. Patient was seen in this emergency room 4 days ago after overdose, hCG negative at that time.      Past Medical History:  Diagnosis Date   Anxiety    Apnea, sleep    no CPAP use, states lost machine during move to [place]   Bipolar 1 disorder (HCC)    Bipolar disorder (HCC)    no current med.   Depression    History of MRSA infection 2010   Irritable bowel syndrome (IBS)    no current med.   Pelvic inflammatory disease    Scalp laceration    staples to be removed 03/13/2014   Ulnar fracture 03/09/2014   left    Patient Active Problem List   Diagnosis Date Noted   Dysmenorrhea 05/28/2014   GC (gonococcus) 05/28/2014   Chlamydia contact 05/28/2014   Pain 04/14/2014   UTI symptoms 04/14/2014   Routine general medical examination at a health care facility 09/26/2013   Candidiasis of vulva and vagina 09/26/2013   Unspecified symptom associated with female genital organs 09/10/2013   Abnormal uterine bleeding (AUB) 09/10/2013   PID (pelvic inflammatory disease) 07/29/2013   Dysuria 07/29/2013   Screening examination for venereal disease 07/29/2013   Chronic PID (chronic pelvic inflammatory disease) 06/30/2013   Excessive or frequent menstruation 06/30/2013   Genital herpes, unspecified 06/30/2013   Unspecified inflammatory disease of female pelvic organs and tissues 03/12/2013    Past Surgical History:  Procedure  Laterality Date   CYST EXCISION     middle of back   ORIF ULNAR FRACTURE Left 03/16/2014   Procedure: OPEN REDUCTION INTERNAL FIXATION (ORIF) LEFT ULNAR FRACTURE;  Surgeon: Marlowe Shores, MD;  Location: [facility];  Service: Orthopedics;  Laterality: Left;  left   OVARIAN CYST REMOVAL     THYROID CYST EXCISION     nodule exc.   TUBAL LIGATION       OB History     Gravida  5   Para  4   Term  4   Preterm      AB  1   Living  4      SAB      IAB      Ectopic  1   Multiple      Live Births  4           Family History  Problem Relation Age of Onset   Heart disease Mother    Cancer Maternal Grandmother        breast   Cancer Paternal Grandmother        breast    Social History   Tobacco Use   Smoking status: Former    Types: Cigars    Quit date: 09/16/2018    Years since quitting: 2.6   Smokeless tobacco: Never   Tobacco comments:    1-2 cig./day  Vaping Use   Vaping  Use: Never used  Substance Use Topics   Alcohol use: Not Currently    Alcohol/week: 0.0 standard drinks    Comment: occ   Drug use: Not Currently    Home Medications Prior to Admission medications   Medication Sig Start Date End Date Taking? Authorizing Provider  acetaminophen (TYLENOL) 500 MG tablet Take 1 tablet (500 mg total) by mouth every 6 (six) hours as needed. 09/30/18   Carlyle Basques P, PA-C  bacitracin ointment Apply 1 application topically 2 (two) times daily. 05/24/21   Curatolo, Adam, DO  ibuprofen (ADVIL,MOTRIN) 800 MG tablet Take 1 tablet (800 mg total) by mouth 3 (three) times daily. 09/30/18   Elson Areas, PA-C  naloxone [facility]) nasal spray 4 mg/0.1 mL [place] for opiod overdose 05/24/21   Virgina Norfolk, DO  ondansetron (ZOFRAN ODT) 4 MG disintegrating tablet 4mg  ODT q4 hours prn nausea/vomit 01/29/21   01/31/21, DO    Allergies    Melene Plan [lurasidone hcl], Sulfa antibiotics, Bee venom, Flagyl [metronidazole], Naproxen, and  Tramadol  Review of Systems   Review of Systems  Unable to perform ROS: Psychiatric disorder  Genitourinary:  Positive for flank pain.   Physical Exam Updated Vital Signs BP (!) 138/98 (BP Location: Left Arm)   Pulse 72   Temp 98.2 F (36.8 C) (Oral)   Resp 16   SpO2 94%   Physical Exam Vitals and nursing note reviewed.  Constitutional:      General: She is not in acute distress.    Appearance: She is well-developed. She is not diaphoretic.  HENT:     Head: Normocephalic and atraumatic.     Mouth/Throat:     Mouth: Mucous membranes are moist.  Eyes:     Comments: Pupils pinpoint  Cardiovascular:     Rate and Rhythm: Normal rate and regular rhythm.     Pulses: Normal pulses.     Heart sounds: Normal heart sounds.  Pulmonary:     Effort: Pulmonary effort is normal.     Breath sounds: Normal breath sounds.  Abdominal:     Tenderness: There is generalized abdominal tenderness. There is guarding.  Musculoskeletal:        General: No swelling or tenderness.     Right lower leg: No edema.     Left lower leg: No edema.  Skin:    General: Skin is warm and dry.     Findings: No rash.     Comments: Burn to left lower leg, healing without evidence of infection.  Neurological:     General: No focal deficit present.     Mental Status: She is alert.     Comments: Ambulatory to stretcher from wheelchair without difficulty  Psychiatric:        Behavior: Behavior is cooperative.    ED Results / Procedures / Treatments   Labs (all labs ordered are listed, but only abnormal results are displayed) Labs Reviewed  CBC WITH DIFFERENTIAL/PLATELET - Abnormal; Notable for the following components:      Result Value   WBC 19.5 (*)    RBC 5.17 (*)    MCV 73.1 (*)    MCH 23.6 (*)    RDW 18.4 (*)    Neutro Abs 16.3 (*)    Monocytes Absolute 1.5 (*)    Abs Immature Granulocytes 0.11 (*)    All other components within normal limits  COMPREHENSIVE METABOLIC PANEL - Abnormal; Notable  for the following components:   Glucose, Bld 112 (*)  All other components within normal limits  LIPASE, BLOOD  ETHANOL  URINALYSIS, ROUTINE W REFLEX MICROSCOPIC  RAPID URINE DRUG SCREEN, HOSP PERFORMED  I-STAT BETA HCG BLOOD, ED ([facility], [facility], [facility] ONLY)    EKG None  Radiology No results found.  Procedures Procedures   Medications Ordered in ED Medications  sodium chloride 0.9 % bolus 1,000 mL (0 mLs Intravenous Stopped 05/28/21 1531)  ondansetron (ZOFRAN) injection 4 mg (4 mg Intravenous Given 05/28/21 1220)    ED Course  I have reviewed the triage vital signs and the nursing notes.  Pertinent labs & imaging results that were available during my care of the patient were reviewed by me and considered in my medical decision making (see chart for details).  Clinical Course as of 05/28/21 1606  Sat May 28, 2021  9127 35 year old female presents emergency room with complaint of right-sided abdominal pain.  Patient thinks that she might have a kidney infection or an ectopic pregnancy. Patient had admitted to drug use by EMS, is intermittently cooperative. Found to have generalized abdominal tenderness. CBC with elevated white blood cell count 19.5 with increase in neutrophils, CMP without significant findings, lipase and normal meds, alcohol negative.  Hcg negative. Patient has not provided a urine sample. Patient was taken to CT scan, refused exam and returned to the room. Discussed available results with patient, recommend CT scan due to her abdominal pain with increased white blood cell count.  Patient is now agreeable to completing the study. [LM]  1535 Care signed out to on coming provider pending CT and urine.  [LM]  1555 Patient was taken to CT and again refused CT scan. Advised patient if she is going to refuse care today that is her choice however we are unable to diagnose the source of her pain and elevated white blood cell count without further studies. Patient is choosing to  refuse care and therefore may go AGAINST MEDICAL ADVICE. [LM]  1605 Patient is spoken with charge nurse and security.  Agrees to have her CT scan and agrees that she will not swing at the CT staff again. Care signed out pending CT and urinalysis. [LM]    Clinical Course User Index [LM] Alden Hipp   MDM Rules/Calculators/A&P                           Final Clinical Impression(s) / ED Diagnoses Final diagnoses:  Generalized abdominal pain    Rx / DC Orders ED Discharge Orders     None        Jeannie Fend, PA-C 05/28/21 1536    Jeannie Fend, PA-C 05/28/21 1606    Cathren Laine, MD 05/28/21 1952

## 2021-05-28 NOTE — ED Triage Notes (Signed)
Patient BIB [facility] from motel. Complaint of flank pain per EMS. Patient unwilling to answer any questions in triage. Per EMS pt used illicit substance prior to arrival, would not specify what however patient very sleepy in triage and unable to open eyes. VSS.

## 2021-05-29 ENCOUNTER — Emergency Department (HOSPITAL_COMMUNITY)
Admission: EM | Admit: 2021-05-29 | Discharge: 2021-05-29 | Disposition: A | Discharge status: Home / Self Care | Payer: Medicaid Other | Attending: Emergency Medicine | Admitting: Emergency Medicine

## 2021-05-29 ENCOUNTER — Encounter (HOSPITAL_COMMUNITY): Payer: Self-pay | Admitting: Emergency Medicine

## 2021-05-29 DIAGNOSIS — F1990 Other psychoactive substance use, unspecified, uncomplicated: Secondary | ICD-10-CM

## 2021-05-29 DIAGNOSIS — Z87891 Personal history of nicotine dependence: Secondary | ICD-10-CM | POA: Diagnosis not present

## 2021-05-29 DIAGNOSIS — E876 Hypokalemia: Secondary | ICD-10-CM | POA: Diagnosis not present

## 2021-05-29 DIAGNOSIS — N200 Calculus of kidney: Secondary | ICD-10-CM | POA: Insufficient documentation

## 2021-05-29 DIAGNOSIS — R109 Unspecified abdominal pain: Secondary | ICD-10-CM | POA: Diagnosis present

## 2021-05-29 DIAGNOSIS — F1910 Other psychoactive substance abuse, uncomplicated: Secondary | ICD-10-CM | POA: Insufficient documentation

## 2021-05-29 DIAGNOSIS — R4182 Altered mental status, unspecified: Secondary | ICD-10-CM | POA: Insufficient documentation

## 2021-05-29 DIAGNOSIS — D72829 Elevated white blood cell count, unspecified: Secondary | ICD-10-CM | POA: Diagnosis not present

## 2021-05-29 DIAGNOSIS — N12 Tubulo-interstitial nephritis, not specified as acute or chronic: Secondary | ICD-10-CM

## 2021-05-29 LAB — BASIC METABOLIC PANEL
Anion gap: 8 (ref 5–15)
BUN: 5 mg/dL — ABNORMAL LOW (ref 6–20)
CO2: 25 mmol/L (ref 22–32)
Calcium: 8.6 mg/dL — ABNORMAL LOW (ref 8.9–10.3)
Chloride: 104 mmol/L (ref 98–111)
Creatinine, Ser: 0.47 mg/dL (ref 0.44–1.00)
GFR, Estimated: >60 mL/min (ref >60)
Glucose, Bld: 95 mg/dL (ref 70–99)
Potassium: 3.3 mmol/L — ABNORMAL LOW (ref 3.5–5.1)
Sodium: 137 mmol/L (ref 135–145)

## 2021-05-29 LAB — CBC WITH DIFFERENTIAL/PLATELET
Abs Immature Granulocytes: 0.15 10*3/uL — ABNORMAL HIGH (ref 0.00–0.07)
Basophils Absolute: 0.1 10*3/uL (ref 0.0–0.1)
Basophils Relative: 0 %
Eosinophils Absolute: 0.2 10*3/uL (ref 0.0–0.5)
Eosinophils Relative: 1 %
HCT: 34.6 % — ABNORMAL LOW (ref 36.0–46.0)
Hemoglobin: 11.1 g/dL — ABNORMAL LOW (ref 12.0–15.0)
Immature Granulocytes: 1 %
Lymphocytes Relative: 9 %
Lymphs Abs: 1.9 10*3/uL (ref 0.7–4.0)
MCH: 23.9 pg — ABNORMAL LOW (ref 26.0–34.0)
MCHC: 32.1 g/dL (ref 30.0–36.0)
MCV: 74.4 fL — ABNORMAL LOW (ref 80.0–100.0)
Monocytes Absolute: 1.8 10*3/uL — ABNORMAL HIGH (ref 0.1–1.0)
Monocytes Relative: 9 %
Neutro Abs: 16.4 10*3/uL — ABNORMAL HIGH (ref 1.7–7.7)
Neutrophils Relative %: 80 %
Platelets: 337 10*3/uL (ref 150–400)
RBC: 4.65 MIL/uL (ref 3.87–5.11)
RDW: 18.3 % — ABNORMAL HIGH (ref 11.5–15.5)
WBC: 20.5 10*3/uL — ABNORMAL HIGH (ref 4.0–10.5)
nRBC: 0 % (ref 0.0–0.2)

## 2021-05-29 LAB — URINE CULTURE: Culture: NO GROWTH

## 2021-05-29 MED ORDER — SODIUM CHLORIDE 0.9 % IV SOLN
1.0000 g | Freq: Once | INTRAVENOUS | Status: AC
  Administered 2021-05-29: 1 g via INTRAVENOUS
  Filled 2021-05-29: qty 10

## 2021-05-29 MED ORDER — NALOXONE HCL 4 MG/0.1ML NA LIQD
1.0000 | Freq: Once | NASAL | Status: AC
  Administered 2021-05-29: 1 via NASAL
  Filled 2021-05-29: qty 4

## 2021-05-29 MED ORDER — SODIUM CHLORIDE 0.9 % IV BOLUS
1000.0000 mL | Freq: Once | INTRAVENOUS | Status: AC
  Administered 2021-05-29: 1000 mL via INTRAVENOUS

## 2021-05-29 NOTE — ED Notes (Signed)
[employer] is her to pick her up and take her to her motel. Patient given discharge paperwork all questions answered. Public safety on stand by.

## 2021-05-29 NOTE — Discharge Instructions (Signed)
                  Intensive Outpatient Programs  [facility]    [facility] [address]     [address] [place],  [place]     [place], [place] [phone]      [phone]  [facility]   [facility]  (Inpatient and outpatient)  [phone] (Suboxone and Methadone) [address]           [phone]           [facility]    [facility] - Intensive Outpatient [address]     [address] [place], [place] [address]     [place], [place]  [phone]      [phone]  [facility] (Outpatient, Inpatient, Chemical  [facility] (Groups and Residental) (insurance only) [phone]    [place], [place]          [phone]       [facility]    [facility] (for caregivers and family) [address]     [address] [place], [place]     [place], [place] [phone]      [phone]  Residential Treatment Programs  [facility]  [facility](2 years) Residential: 90 days)  [facility] [address]      [address] [place], [place]     [place], [place] [phone]      [phone] or [phone]  [facility]    [facility] [address]      [address] [place], [place]     [place], [place] [phone]      [phone]  [facility]   [facility] ([facility] [address]     [address] [place], [place] [address]     [place], [place] [phone]      [phone] Admissions: 8am-3pm M-F  [facility]: Residential Program (90 Days)              [facility]: [facility]  [place], [place]     [place],   [phone] or [phone]    (Walk in Hours over the weekend or by referral)   [facility]: [facility]:[phone] (for crisis  response 24 hours a day) 

## 2021-05-29 NOTE — ED Notes (Signed)
Patient is sitting up eating Narcan given

## 2021-05-29 NOTE — ED Triage Notes (Signed)
Pt was discharged from ED earlier in the evening.  She came back into lobby to sleep and when asked to leave she stated she has an abdominal infections.  Pt is very lethargic but is able to wake up when called to.  She is not helpful in answering questions concerning her symptoms.

## 2021-05-29 NOTE — ED Provider Notes (Signed)
[facility] EMERGENCY DEPARTMENT Provider Note   CSN: [id number] Arrival date & time: 05/29/21  0543    History Chief Complaint  Patient presents with   Abdominal Pain    Leah Barry is a 35 y.o. female with past medical history significant for bipolar, PID, recent diagnosis of pyelonephritis who presents for evaluation.  Recently discharged yesterday evening for pyelonephritis.  He was given Rocephin.  Apparently after discharge fell asleep in the lobby.  When asked by security as she was checking back in patient states she had an abdominal infection.  She was very sleepy.  Was not answering questions.  Apparently was aggressive with staff.  Seen 5 days ago for drug overdose after smoking crack.  Was given Narcan.  My evaluation patient sleeping.  Needs to be called loudly to arouse.  Cannot hold a conversation.  When asked why she is here she starts to say infection cuts off half the word and then falls back to sleep.  She does toss and turn in bed and move all 4 extremities without difficulty.  Level 5 caveat-altered mental status  HPI     Past Medical History:  Diagnosis Date   Anxiety    Apnea, sleep    no CPAP use, states lost machine during move to [place]   Bipolar 1 disorder (HCC)    Bipolar disorder (HCC)    no current med.   Depression    History of MRSA infection 2010   Irritable bowel syndrome (IBS)    no current med.   Pelvic inflammatory disease    Scalp laceration    staples to be removed 03/13/2014   Ulnar fracture 03/09/2014   left    Patient Active Problem List   Diagnosis Date Noted   Dysmenorrhea 05/28/2014   GC (gonococcus) 05/28/2014   Chlamydia contact 05/28/2014   Pain 04/14/2014   UTI symptoms 04/14/2014   Routine general medical examination at a health care facility 09/26/2013   Candidiasis of vulva and vagina 09/26/2013   Unspecified symptom associated with female genital organs 09/10/2013   Abnormal uterine bleeding  (AUB) 09/10/2013   PID (pelvic inflammatory disease) 07/29/2013   Dysuria 07/29/2013   Screening examination for venereal disease 07/29/2013   Chronic PID (chronic pelvic inflammatory disease) 06/30/2013   Excessive or frequent menstruation 06/30/2013   Genital herpes, unspecified 06/30/2013   Unspecified inflammatory disease of female pelvic organs and tissues 03/12/2013    Past Surgical History:  Procedure Laterality Date   CYST EXCISION     middle of back   ORIF ULNAR FRACTURE Left 03/16/2014   Procedure: OPEN REDUCTION INTERNAL FIXATION (ORIF) LEFT ULNAR FRACTURE;  Surgeon: Marlowe Shores, MD;  Location: [facility];  Service: Orthopedics;  Laterality: Left;  left   OVARIAN CYST REMOVAL     THYROID CYST EXCISION     nodule exc.   TUBAL LIGATION       OB History     Gravida  5   Para  4   Term  4   Preterm      AB  1   Living  4      SAB      IAB      Ectopic  1   Multiple      Live Births  4           Family History  Problem Relation Age of Onset   Heart disease Mother    Cancer Maternal  Grandmother        breast   Cancer Paternal Grandmother        breast    Social History   Tobacco Use   Smoking status: Former    Types: Cigars    Quit date: 09/16/2018    Years since quitting: 2.7   Smokeless tobacco: Never   Tobacco comments:    1-2 cig./day  Vaping Use   Vaping Use: Never used  Substance Use Topics   Alcohol use: Not Currently    Alcohol/week: 0.0 standard drinks    Comment: occ   Drug use: Not Currently    Home Medications Prior to Admission medications   Medication Sig Start Date End Date Taking? Authorizing Provider  acetaminophen (TYLENOL) 500 MG tablet Take 1 tablet (500 mg total) by mouth every 6 (six) hours as needed. 09/30/18   Carlyle Basques P, PA-C  bacitracin ointment Apply 1 application topically 2 (two) times daily. 05/24/21   Curatolo, Adam, DO  cefpodoxime (VANTIN) 200 MG tablet Take 1 tablet  (200 mg total) by mouth 2 (two) times daily for 10 days. 05/28/21 06/07/21  Joy, Shawn C, PA-C  ibuprofen (ADVIL,MOTRIN) 800 MG tablet Take 1 tablet (800 mg total) by mouth 3 (three) times daily. 09/30/18   Elson Areas, PA-C  naloxone [facility]) nasal spray 4 mg/0.1 mL [place] for opiod overdose 05/24/21   Virgina Norfolk, DO  ondansetron (ZOFRAN ODT) 4 MG disintegrating tablet 4mg  ODT q4 hours prn nausea/vomit 01/29/21   01/31/21, DO    Allergies    Melene Plan [lurasidone hcl], Sulfa antibiotics, Bee venom, Flagyl [metronidazole], Naproxen, and Tramadol  Review of Systems   Review of Systems  Unable to perform ROS: Mental status change  All other systems reviewed and are negative.  Physical Exam Updated Vital Signs BP (!) 142/81 (BP Location: Left Arm)   Pulse 76   Temp 98.2 F (36.8 C) (Oral)   Resp 20   SpO2 97%   Physical Exam Vitals and nursing note reviewed.  Constitutional:      General: She is not in acute distress.    Appearance: She is well-developed. She is not ill-appearing, toxic-appearing or diaphoretic.     Comments: Swats at my hand when attempting exam  HENT:     Head: Atraumatic.  Eyes:     Pupils: Pupils are equal, round, and reactive to light.  Cardiovascular:     Rate and Rhythm: Normal rate.  Pulmonary:     Effort: No respiratory distress.  Abdominal:     General: There is no distension.  Musculoskeletal:        General: Normal range of motion.     Cervical back: Normal range of motion.  Skin:    General: Skin is warm and dry.  Neurological:     General: No focal deficit present.     Mental Status: She is alert.     Comments: Sleepy, moves all 4 extremities spontaneously in bed When attempting pupil exam patient screaming and hits at this provider "Stating leave me alone."   Psychiatric:        Mood and Affect: Mood normal.    ED Results / Procedures / Treatments   Labs (all labs ordered are listed, but only abnormal results are  displayed) Labs Reviewed  CBC WITH DIFFERENTIAL/PLATELET - Abnormal; Notable for the following components:      Result Value   WBC 20.5 (*)    Hemoglobin 11.1 (*)    HCT 34.6 (*)  MCV 74.4 (*)    MCH 23.9 (*)    RDW 18.3 (*)    Neutro Abs 16.4 (*)    Monocytes Absolute 1.8 (*)    Abs Immature Granulocytes 0.15 (*)    All other components within normal limits  BASIC METABOLIC PANEL - Abnormal; Notable for the following components:   Potassium 3.3 (*)    BUN 5 (*)    Calcium 8.6 (*)    All other components within normal limits    EKG None  Radiology CT Abdomen Pelvis W Contrast  Result Date: 05/28/2021 CLINICAL DATA:  Abdominal and flank pain EXAM: CT ABDOMEN AND PELVIS WITH CONTRAST TECHNIQUE: Multidetector CT imaging of the abdomen and pelvis was performed using the standard protocol following bolus administration of intravenous contrast. CONTRAST:  100mL OMNIPAQUE IOHEXOL 300 MG/ML  SOLN COMPARISON:  01/31/2017 FINDINGS: Lower chest: Hypoventilatory changes are seen at the lung bases. Hepatobiliary: Gallbladder is unremarkable without cholelithiasis or cholecystitis. Mild periportal edema is nonspecific. No intrahepatic biliary duct dilation. No focal liver abnormalities. Pancreas: Unremarkable. No pancreatic ductal dilatation or surrounding inflammatory changes. Spleen: Normal in size without focal abnormality. Adrenals/Urinary Tract: There are subtle areas of decreased cortical enhancement within the left kidney, which is mildly enlarged relative to the right. Findings could reflect pyelonephritis. Please correlate with urinalysis. No urinary tract calculi or obstructive uropathy. The adrenals and bladder are unremarkable. Stomach/Bowel: No bowel obstruction or ileus. Normal appendix right lower quadrant. No bowel wall thickening or inflammatory change. Vascular/Lymphatic: No significant vascular findings are present. No enlarged abdominal or pelvic lymph nodes. Reproductive: Uterus  and bilateral adnexa are unremarkable. Other: No free fluid or free gas.  No abdominal wall hernia. Musculoskeletal: No acute or destructive bony lesions. Reconstructed images demonstrate no additional findings. IMPRESSION: 1. Mild enlargement of the left kidney with subtle areas of decreased cortical enhancement, suspicious for left-sided pyelonephritis. Please correlate with urinalysis. 2. Periportal edema, which can be associated with acute pyelonephritis. Additionally, cholangitis or hepatitis could give this appearance. Please correlate with liver function tests. Electronically Signed   By: Sharlet SalinaMichael  Brown M.D.   On: 05/28/2021 17:16    Procedures Procedures   Medications Ordered in ED Medications  naloxone (NARCAN) nasal spray 4 mg/0.1 mL (1 spray Nasal Provided for home use 05/29/21 1008)  sodium chloride 0.9 % bolus 1,000 mL (0 mLs Intravenous Stopped 05/29/21 1000)  cefTRIAXone (ROCEPHIN) 1 g in sodium chloride 0.9 % 100 mL IVPB (0 g Intravenous Stopped 05/29/21 0959)    ED Course  I have reviewed the triage vital signs and the nursing notes.  Pertinent labs & imaging results that were available during my care of the patient were reviewed by me and considered in my medical decision making (see chart for details).  35 year old seen yesterday diagnosed with pyelonephritis.  She has history of substance use, smoking crack cocaine, seen most recently 5 days ago for overdose.  Apparently unclear if she actually left the emergency department was found sleeping in the lobby.  Patient does not provide much history, falls asleep when starting to answer why she is here.  There is no obvious traumatic injuries.  She moves all 4 extremities spontaneously.  Swats at my hands on attempting any sort of exam.  Labs personally reviewed and interpreted:  CBC leukocytosis at 20.5 similar to yesterday BMP with hypokalemia at 3.3  Patient reassessed.  More arousable.  Follows commands.  States she is  "sleepy."  She is protecting her airway.  However p.o.  challenge, DC with resources for outpatient follow-up.   Patient reassessed. Ambulatory to RR. Able to sit up and tolerate PO. Nursing has gotten patient bus pass. She still has Rx for abx given yesterday. Suspect patient's mentation like from substance use.  She does arouse to normal mentation when attempting further neurologic exam, yelling at this provider and swatting to "leave me alone." Low suspicion for encephalopathy from infectious process causing mentation problems.    Observed here in ED with increase in normal mentation. Ambulatory out at ED doors without difficult.  The patient has been appropriately medically screened and/or stabilized in the ED. I have low suspicion for any other emergent medical condition which would require further screening, evaluation or treatment in the ED or require inpatient management.  Patient is hemodynamically stable and in no acute distress.  Patient able to ambulate in department prior to ED.  Evaluation does not show acute pathology that would require ongoing or additional emergent interventions while in the emergency department or further inpatient treatment.  I have discussed the diagnosis with the patient and answered all questions.  Pain is been managed while in the emergency department and patient has no further complaints prior to discharge.  Patient is comfortable with plan discussed in room and is stable for discharge at this time.  I have discussed strict return precautions for returning to the emergency department.  Patient was encouraged to follow-up with PCP/specialist refer to at discharge.   Patient seen and evaluated by attending who is agreeable with above treatment, plan and disposition.    MDM Rules/Calculators/A&P                           Final Clinical Impression(s) / ED Diagnoses Final diagnoses:  Pyelonephritis  Substance use    Rx / DC Orders ED Discharge Orders      None        Teshara Moree A, PA-C 05/29/21 1233    Milagros Loll, MD 05/30/21 [phone]
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
## 2021-05-29 NOTE — ED Notes (Signed)
Patient is sleeping and wakes up to light stimulation. She asks to be left alone and pain meds.

## 2021-12-23 ENCOUNTER — Emergency Department (HOSPITAL_COMMUNITY): Payer: Medicaid Other

## 2021-12-23 ENCOUNTER — Encounter (HOSPITAL_COMMUNITY): Payer: Self-pay | Admitting: Emergency Medicine

## 2021-12-23 ENCOUNTER — Other Ambulatory Visit: Payer: Self-pay

## 2021-12-23 ENCOUNTER — Emergency Department (HOSPITAL_COMMUNITY)
Admission: EM | Admit: 2021-12-23 | Discharge: 2021-12-23 | Disposition: A | Discharge status: Home / Self Care | Payer: Medicaid Other | Attending: Emergency Medicine | Admitting: Emergency Medicine

## 2021-12-23 DIAGNOSIS — S1191XA Laceration without foreign body of unspecified part of neck, initial encounter: Secondary | ICD-10-CM

## 2021-12-23 DIAGNOSIS — W268XXA Contact with other sharp object(s), not elsewhere classified, initial encounter: Secondary | ICD-10-CM | POA: Insufficient documentation

## 2021-12-23 DIAGNOSIS — S1193XA Puncture wound without foreign body of unspecified part of neck, initial encounter: Secondary | ICD-10-CM | POA: Insufficient documentation

## 2021-12-23 DIAGNOSIS — Y9259 Other trade areas as the place of occurrence of the external cause: Secondary | ICD-10-CM | POA: Diagnosis not present

## 2021-12-23 DIAGNOSIS — R519 Headache, unspecified: Secondary | ICD-10-CM | POA: Diagnosis not present

## 2021-12-23 DIAGNOSIS — T148XXA Other injury of unspecified body region, initial encounter: Secondary | ICD-10-CM

## 2021-12-23 DIAGNOSIS — T1490XA Injury, unspecified, initial encounter: Secondary | ICD-10-CM

## 2021-12-23 HISTORY — DX: Laceration without foreign body of unspecified part of neck, initial encounter: S11.91XA

## 2021-12-23 LAB — COMPREHENSIVE METABOLIC PANEL
ALT: 15 U/L (ref 0–44)
AST: 16 U/L (ref 15–41)
Albumin: 3.5 g/dL (ref 3.5–5.0)
Alkaline Phosphatase: 65 U/L (ref 38–126)
Anion gap: 10 (ref 5–15)
BUN: 10 mg/dL (ref 6–20)
CO2: 23 mmol/L (ref 22–32)
Calcium: 8.9 mg/dL (ref 8.9–10.3)
Chloride: 103 mmol/L (ref 98–111)
Creatinine, Ser: 0.6 mg/dL (ref 0.44–1.00)
GFR, Estimated: >60 mL/min (ref >60)
Glucose, Bld: 115 mg/dL — ABNORMAL HIGH (ref 70–99)
Potassium: 3.6 mmol/L (ref 3.5–5.1)
Sodium: 136 mmol/L (ref 135–145)
Total Bilirubin: 0.3 mg/dL (ref 0.3–1.2)
Total Protein: 6.8 g/dL (ref 6.5–8.1)

## 2021-12-23 LAB — I-STAT CHEM 8, ED
BUN: 10 mg/dL (ref 6–20)
Calcium, Ion: 1.15 mmol/L (ref 1.15–1.40)
Chloride: 102 mmol/L (ref 98–111)
Creatinine, Ser: 0.5 mg/dL (ref 0.44–1.00)
Glucose, Bld: 114 mg/dL — ABNORMAL HIGH (ref 70–99)
HCT: 36 % (ref 36.0–46.0)
Hemoglobin: 12.2 g/dL (ref 12.0–15.0)
Potassium: 3.6 mmol/L (ref 3.5–5.1)
Sodium: 138 mmol/L (ref 135–145)
TCO2: 25 mmol/L (ref 22–32)

## 2021-12-23 LAB — I-STAT BETA HCG BLOOD, ED (MC, WL, AP ONLY): I-stat hCG, quantitative: <5 m[IU]/mL (ref <5)

## 2021-12-23 LAB — CBC
HCT: 33.5 % — ABNORMAL LOW (ref 36.0–46.0)
Hemoglobin: 11 g/dL — ABNORMAL LOW (ref 12.0–15.0)
MCH: 23 pg — ABNORMAL LOW (ref 26.0–34.0)
MCHC: 32.8 g/dL (ref 30.0–36.0)
MCV: 69.9 fL — ABNORMAL LOW (ref 80.0–100.0)
Platelets: 404 10*3/uL — ABNORMAL HIGH (ref 150–400)
RBC: 4.79 MIL/uL (ref 3.87–5.11)
RDW: 17.1 % — ABNORMAL HIGH (ref 11.5–15.5)
WBC: 7 10*3/uL (ref 4.0–10.5)
nRBC: 0 % (ref 0.0–0.2)

## 2021-12-23 LAB — PROTIME-INR
INR: 0.9 (ref 0.8–1.2)
Prothrombin Time: 12 seconds (ref 11.4–15.2)

## 2021-12-23 LAB — ETHANOL: Alcohol, Ethyl (B): <10 mg/dL (ref <10)

## 2021-12-23 LAB — SAMPLE TO BLOOD BANK

## 2021-12-23 LAB — LACTIC ACID, PLASMA: Lactic Acid, Venous: 1.8 mmol/L (ref 0.5–1.9)

## 2021-12-23 MED ORDER — FENTANYL CITRATE PF 50 MCG/ML IJ SOSY
PREFILLED_SYRINGE | INTRAMUSCULAR | Status: AC | PRN
  Administered 2021-12-23: 50 ug via INTRAVENOUS

## 2021-12-23 MED ORDER — LIDOCAINE HCL (PF) 2 % IJ SOLN
INTRAMUSCULAR | Status: AC
  Filled 2021-12-23: qty 5

## 2021-12-23 MED ORDER — IOHEXOL 350 MG/ML SOLN
60.0000 mL | Freq: Once | INTRAVENOUS | Status: AC | PRN
Start: 2021-12-23 — End: 2021-12-23
  Administered 2021-12-23: 60 mL via INTRAVENOUS

## 2021-12-23 MED ORDER — FENTANYL CITRATE (PF) 100 MCG/2ML IJ SOLN
INTRAMUSCULAR | Status: AC
  Filled 2021-12-23: qty 2
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
## 2021-12-23 NOTE — Procedures (Signed)
Laceration Repair  Date/Time: 12/23/2021 11:16 AM Performed by: Barnetta Chapel, PA-C Authorized by: Barnetta Chapel, PA-C   Consent:    Consent obtained:  Emergent situation Universal protocol:    Patient identity confirmed:  Arm band Anesthesia:    Anesthesia method:  Local infiltration   Local anesthetic:  Lidocaine 2% w/o epi Laceration details:    Location:  Neck   Neck location:  R anterior   Length (cm):  4   Depth (mm):  5 Pre-procedure details:    Preparation:  Patient was prepped and draped in usual sterile fashion Exploration:    Hemostasis achieved with:  Direct pressure Treatment:    Area cleansed with:  Povidone-iodine   Amount of cleaning:  Standard   Visualized foreign bodies/material removed: no     Layers/structures repaired:  Deep dermal/superficial fascia Deep dermal/superficial fascia:    Suture size:  4-0   Suture material:  Vicryl   Suture technique:  Simple interrupted Skin repair:    Repair method:  Tissue adhesive Approximation:    Approximation:  Close Repair type:    Repair type:  Simple Post-procedure details:    Dressing:  Open (no dressing)   Procedure completion:  Tolerated well, no immediate complications  Letha Cape 11:20 AM 12/23/2021
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
## 2021-12-23 NOTE — Progress Notes (Signed)
Orthopedic Tech Progress Note Patient Details:  Leah Barry January 01, 1986 [id number] Level 1 Trauma. Not needed Patient ID: [name], female   DOB: 02/17/1986, 36 y.o.   MRN: [id number]  Lovett Calender 12/23/2021, 9:57 AM

## 2022-02-27 ENCOUNTER — Encounter (HOSPITAL_COMMUNITY): Payer: Self-pay | Admitting: Emergency Medicine

## 2022-02-27 ENCOUNTER — Emergency Department (HOSPITAL_COMMUNITY): Payer: Medicaid Other

## 2022-02-27 ENCOUNTER — Other Ambulatory Visit: Payer: Self-pay

## 2022-02-27 ENCOUNTER — Emergency Department (HOSPITAL_COMMUNITY)
Admission: EM | Admit: 2022-02-27 | Discharge: 2022-02-27 | Disposition: A | Discharge status: Home / Self Care | Payer: Medicaid Other | Attending: Emergency Medicine | Admitting: Emergency Medicine

## 2022-02-27 DIAGNOSIS — N9489 Other specified conditions associated with female genital organs and menstrual cycle: Secondary | ICD-10-CM | POA: Diagnosis not present

## 2022-02-27 DIAGNOSIS — R4182 Altered mental status, unspecified: Secondary | ICD-10-CM | POA: Diagnosis not present

## 2022-02-27 DIAGNOSIS — R000 Tachycardia, unspecified: Secondary | ICD-10-CM | POA: Diagnosis not present

## 2022-02-27 DIAGNOSIS — L03115 Cellulitis of right lower limb: Secondary | ICD-10-CM | POA: Diagnosis not present

## 2022-02-27 DIAGNOSIS — F1410 Cocaine abuse, uncomplicated: Secondary | ICD-10-CM

## 2022-02-27 DIAGNOSIS — M7989 Other specified soft tissue disorders: Secondary | ICD-10-CM

## 2022-02-27 LAB — I-STAT VENOUS BLOOD GAS, ED
Acid-Base Excess: 1 mmol/L (ref 0.0–2.0)
Bicarbonate: 26.2 mmol/L (ref 20.0–28.0)
Calcium, Ion: 1.11 mmol/L — ABNORMAL LOW (ref 1.15–1.40)
HCT: 31 % — ABNORMAL LOW (ref 36.0–46.0)
Hemoglobin: 10.5 g/dL — ABNORMAL LOW (ref 12.0–15.0)
O2 Saturation: 99 %
Potassium: 3.8 mmol/L (ref 3.5–5.1)
Sodium: 137 mmol/L (ref 135–145)
TCO2: 28 mmol/L (ref 22–32)
pCO2, Ven: 43.3 mmHg — ABNORMAL LOW (ref 44–60)
pH, Ven: 7.39 (ref 7.25–7.43)
pO2, Ven: 140 mmHg — ABNORMAL HIGH (ref 32–45)

## 2022-02-27 LAB — URINALYSIS, ROUTINE W REFLEX MICROSCOPIC
Bilirubin Urine: NEGATIVE
Glucose, UA: NEGATIVE mg/dL
Ketones, ur: NEGATIVE mg/dL
Leukocytes,Ua: NEGATIVE
Nitrite: NEGATIVE
Protein, ur: NEGATIVE mg/dL
Specific Gravity, Urine: 1.005 (ref 1.005–1.030)
pH: 7 (ref 5.0–8.0)

## 2022-02-27 LAB — CBC WITH DIFFERENTIAL/PLATELET
Abs Immature Granulocytes: 0 10*3/uL (ref 0.00–0.07)
Basophils Absolute: 0.6 10*3/uL — ABNORMAL HIGH (ref 0.0–0.1)
Basophils Relative: 4 %
Eosinophils Absolute: 0.2 10*3/uL (ref 0.0–0.5)
Eosinophils Relative: 1 %
HCT: 30.1 % — ABNORMAL LOW (ref 36.0–46.0)
Hemoglobin: 9.8 g/dL — ABNORMAL LOW (ref 12.0–15.0)
Lymphocytes Relative: 14 %
Lymphs Abs: 2.2 10*3/uL (ref 0.7–4.0)
MCH: 22.7 pg — ABNORMAL LOW (ref 26.0–34.0)
MCHC: 32.6 g/dL (ref 30.0–36.0)
MCV: 69.8 fL — ABNORMAL LOW (ref 80.0–100.0)
Monocytes Absolute: 1.1 10*3/uL — ABNORMAL HIGH (ref 0.1–1.0)
Monocytes Relative: 7 %
Neutro Abs: 11.7 10*3/uL — ABNORMAL HIGH (ref 1.7–7.7)
Neutrophils Relative %: 74 %
Platelets: 309 10*3/uL (ref 150–400)
RBC: 4.31 MIL/uL (ref 3.87–5.11)
RDW: 17.5 % — ABNORMAL HIGH (ref 11.5–15.5)
Smear Review: NORMAL
WBC: 15.8 10*3/uL — ABNORMAL HIGH (ref 4.0–10.5)
nRBC: 0 % (ref 0.0–0.2)

## 2022-02-27 LAB — COMPREHENSIVE METABOLIC PANEL
ALT: 12 U/L (ref 0–44)
AST: 18 U/L (ref 15–41)
Albumin: 3.7 g/dL (ref 3.5–5.0)
Alkaline Phosphatase: 64 U/L (ref 38–126)
Anion gap: 10 (ref 5–15)
BUN: 6 mg/dL (ref 6–20)
CO2: 22 mmol/L (ref 22–32)
Calcium: 8.7 mg/dL — ABNORMAL LOW (ref 8.9–10.3)
Chloride: 105 mmol/L (ref 98–111)
Creatinine, Ser: 0.49 mg/dL (ref 0.44–1.00)
GFR, Estimated: >60 mL/min (ref >60)
Glucose, Bld: 112 mg/dL — ABNORMAL HIGH (ref 70–99)
Potassium: 3.7 mmol/L (ref 3.5–5.1)
Sodium: 137 mmol/L (ref 135–145)
Total Bilirubin: 0.7 mg/dL (ref 0.3–1.2)
Total Protein: 7 g/dL (ref 6.5–8.1)

## 2022-02-27 LAB — RAPID URINE DRUG SCREEN, HOSP PERFORMED
Amphetamines: NOT DETECTED
Barbiturates: NOT DETECTED
Benzodiazepines: NOT DETECTED
Cocaine: POSITIVE — AB
Opiates: NOT DETECTED
Tetrahydrocannabinol: NOT DETECTED

## 2022-02-27 LAB — ETHANOL: Alcohol, Ethyl (B): <10 mg/dL (ref <10)

## 2022-02-27 LAB — I-STAT BETA HCG BLOOD, ED (MC, WL, AP ONLY): I-stat hCG, quantitative: 7.3 m[IU]/mL — ABNORMAL HIGH (ref <5)

## 2022-02-27 LAB — CK: Total CK: 129 U/L (ref 38–234)

## 2022-02-27 LAB — LACTIC ACID, PLASMA: Lactic Acid, Venous: 1.2 mmol/L (ref 0.5–1.9)

## 2022-02-27 LAB — SALICYLATE LEVEL: Salicylate Lvl: <7 mg/dL — ABNORMAL LOW (ref 7.0–30.0)

## 2022-02-27 LAB — AMMONIA: Ammonia: 44 umol/L — ABNORMAL HIGH (ref 9–35)

## 2022-02-27 LAB — ACETAMINOPHEN LEVEL: Acetaminophen (Tylenol), Serum: <10 ug/mL — ABNORMAL LOW (ref 10–30)

## 2022-02-27 MED ORDER — NALOXONE HCL 0.4 MG/ML IJ SOLN
0.4000 mg | Freq: Once | INTRAMUSCULAR | Status: AC
  Administered 2022-02-27: 0.4 mg via INTRAVENOUS
  Filled 2022-02-27: qty 1

## 2022-02-27 MED ORDER — ACETAMINOPHEN 500 MG PO TABS
1000.0000 mg | ORAL_TABLET | Freq: Once | ORAL | Status: AC
  Administered 2022-02-27: 1000 mg via ORAL
  Filled 2022-02-27: qty 2

## 2022-02-27 MED ORDER — CEFTRIAXONE SODIUM 1 G IJ SOLR
1.0000 g | Freq: Once | INTRAMUSCULAR | Status: AC
  Administered 2022-02-27: 1 g via INTRAVENOUS
  Filled 2022-02-27: qty 10

## 2022-02-27 MED ORDER — SODIUM CHLORIDE 0.9 % IV BOLUS
500.0000 mL | Freq: Once | INTRAVENOUS | Status: AC
  Administered 2022-02-27: 500 mL via INTRAVENOUS

## 2022-02-27 MED ORDER — DOXYCYCLINE HYCLATE 100 MG PO TABS
100.0000 mg | ORAL_TABLET | Freq: Once | ORAL | Status: AC
Start: 2022-02-27 — End: 2022-02-27
  Administered 2022-02-27: 100 mg via ORAL
  Filled 2022-02-27: qty 1

## 2022-02-27 MED ORDER — DOXYCYCLINE HYCLATE 100 MG PO CAPS: 100.0000 mg | ORAL_CAPSULE | Freq: Two times a day (BID) | ORAL | 0 refills | Status: AC

## 2022-02-27 NOTE — Progress Notes (Signed)
Orthopedic Tech Progress Note ?Patient Details:  ?Leah Barry ?24-Dec-1985 ?[id number] ? ?Patient was very rude and wouldn't let me apply it completely, kept going in and out of sleep and kept snatching her foot away  ? ?Ortho Devices ?Type of Ortho Device: CAM walker ?Ortho Device/Splint Location: RLE ?Ortho Device/Splint Interventions: Ordered, Other (comment) ?  ?Post Interventions ?Patient Tolerated: Fair, Poor ?Instructions Provided: Care of device ? ?Janit Pagan ?02/27/2022, 1:02 PM ? ?

## 2022-02-27 NOTE — ED Provider Notes (Signed)
?[facility] ?Provider Note ? ? ?CSN: [id number] ?Arrival date & time: 02/27/22  0720 ? ?  ? ?History ? ?Chief Complaint  ?Patient presents with  ? Foot Pain  ? Lethargic  ? ? ?Leah Barry is a 36 y.o. female. ? ?Patient presents with EMS from a motel with altered mental status and right foot swelling.  Unable to get details from patient due to being altered.  No reported or witnessed details of trauma.  Patient did report 3 days of swelling.  Difficulty patient staying awake for further details.  Unsure if fevers.  0.8 ? ? ?  ? ?Home Medications ?Prior to Admission medications   ?Medication Sig Start Date End Date Taking? Authorizing Provider  ?naloxone [facility]) nasal spray 4 mg/0.1 mL [place] for opiod overdose 05/24/21   Virgina Norfolkuratolo, Adam, DO  ?   ? ?Allergies    ?Latuda [lurasidone hcl], Sulfa antibiotics, Bee venom, Flagyl [metronidazole], Naproxen, and Tramadol   ? ?Review of Systems   ?Review of Systems  ?Unable to perform ROS: Mental status change  ? ?Physical Exam ?Updated Vital Signs ?BP 113/72   Pulse 92   Temp (!) 97 ?F (36.1 ?C) (Rectal)   Resp (!) 30   SpO2 96%  ?Physical Exam ?Vitals and nursing note reviewed.  ?Constitutional:   ?   General: She is not in acute distress. ?   Appearance: She is well-developed.  ?HENT:  ?   Head: Normocephalic and atraumatic.  ?   Mouth/Throat:  ?   Mouth: Mucous membranes are moist.  ?Eyes:  ?   General:     ?   Right eye: No discharge.     ?   Left eye: No discharge.  ?   Conjunctiva/sclera: Conjunctivae normal.  ?Neck:  ?   Trachea: No tracheal deviation.  ?Cardiovascular:  ?   Rate and Rhythm: Regular rhythm. Tachycardia present.  ?   Heart sounds: No murmur heard. ?Pulmonary:  ?   Effort: Pulmonary effort is normal.  ?   Breath sounds: Normal breath sounds.  ?Abdominal:  ?   General: There is no distension.  ?   Palpations: Abdomen is soft.  ?   Tenderness: There is no abdominal tenderness. There is no guarding.   ?Musculoskeletal:     ?   General: Swelling and tenderness present. No deformity.  ?   Cervical back: Normal range of motion and neck supple. No rigidity.  ?Skin: ?   General: Skin is warm.  ?   Capillary Refill: Capillary refill takes less than 2 seconds.  ?   Comments: Patient has swelling tenderness mid and proximal right lateral foot, tender to palpation.  Mild warmth erythema.  No crepitus.  Swelling extends to lateral ankle.  No obvious open wounds.  ?Neurological:  ?   Mental Status: She is alert.  ?   GCS: GCS eye subscore is 3. GCS verbal subscore is 4. GCS motor subscore is 5.  ?   Comments: Patient sleeping during exam, will awaken to painful stimuli and move all extremities equal bilateral, few words and then go back to sleep.  Pupils equal bilateral.  Horizontal eye movements intact.  Gross sensation intact bilateral.  ? ? ?ED Results / Procedures / Treatments   ?Labs ?(all labs ordered are listed, but only abnormal results are displayed) ?Labs Reviewed  ?COMPREHENSIVE METABOLIC PANEL - Abnormal; Notable for the following components:  ?    Result Value  ? Glucose, Bld  112 (*)   ? Calcium 8.7 (*)   ? All other components within normal limits  ?SALICYLATE LEVEL - Abnormal; Notable for the following components:  ? Salicylate Lvl <7.0 (*)   ? All other components within normal limits  ?CBC WITH DIFFERENTIAL/PLATELET - Abnormal; Notable for the following components:  ? WBC 15.8 (*)   ? Hemoglobin 9.8 (*)   ? HCT 30.1 (*)   ? MCV 69.8 (*)   ? MCH 22.7 (*)   ? RDW 17.5 (*)   ? Neutro Abs 11.7 (*)   ? Monocytes Absolute 1.1 (*)   ? Basophils Absolute 0.6 (*)   ? All other components within normal limits  ?ACETAMINOPHEN LEVEL - Abnormal; Notable for the following components:  ? Acetaminophen (Tylenol), Serum <10 (*)   ? All other components within normal limits  ?AMMONIA - Abnormal; Notable for the following components:  ? Ammonia 44 (*)   ? All other components within normal limits  ?I-STAT VENOUS BLOOD GAS,  ED - Abnormal; Notable for the following components:  ? pCO2, Ven 43.3 (*)   ? pO2, Ven 140 (*)   ? Calcium, Ion 1.11 (*)   ? HCT 31.0 (*)   ? Hemoglobin 10.5 (*)   ? All other components within normal limits  ?I-STAT BETA HCG BLOOD, ED ([facility], [facility], [facility] ONLY) - Abnormal; Notable for the following components:  ? I-stat hCG, quantitative 7.3 (*)   ? All other components within normal limits  ?URINE CULTURE  ?ETHANOL  ?LACTIC ACID, PLASMA  ?CK  ?URINALYSIS, ROUTINE W REFLEX MICROSCOPIC  ?RAPID URINE DRUG SCREEN, HOSP PERFORMED  ? ? ?EKG ?None ? ?Radiology ?CT Head Wo Contrast ? ?Result Date: 02/27/2022 ?CLINICAL DATA:  altered; altered, foot injury EXAM: CT HEAD WITHOUT CONTRAST CT CERVICAL SPINE WITHOUT CONTRAST TECHNIQUE: Multidetector CT imaging of the head and cervical spine was performed following the standard protocol without intravenous contrast. Multiplanar CT image reconstructions of the cervical spine were also generated. RADIATION DOSE REDUCTION: This exam was performed according to the departmental dose-optimization program which includes automated exposure control, adjustment of the mA and/or kV according to patient size and/or use of iterative reconstruction technique. COMPARISON:  CT head and CT cervical spine 05/24/2021. FINDINGS: CT HEAD FINDINGS Brain: No evidence of acute infarction, hemorrhage, hydrocephalus, extra-axial collection or mass lesion/mass effect. Vascular: No lower hyperdense vessel identified. Skull: No acute fracture. Sinuses/Orbits: Remote left medial orbital wall fracture. Clear visualized sinuses. No acute orbital findings. Other: No mastoid effusions. CT CERVICAL SPINE FINDINGS Alignment: No substantial sagittal subluxation. Mild broad levocurvature. Skull base and vertebrae: Vertebral body heights are maintained. No evidence of acute fracture. Soft tissues and spinal canal: No prevertebral fluid or swelling. No visible canal hematoma. Disc levels:  Mild degenerative change. Upper  chest: Visualized lung apices are clear. IMPRESSION: 1. No evidence of acute intracranial abnormality. 2. No evidence of acute fracture or traumatic malalignment in the cervical spine. Electronically Signed   By: Feliberto Harts M.D.   On: 02/27/2022 08:50  ? ?CT Cervical Spine Wo Contrast ? ?Result Date: 02/27/2022 ?CLINICAL DATA:  altered; altered, foot injury EXAM: CT HEAD WITHOUT CONTRAST CT CERVICAL SPINE WITHOUT CONTRAST TECHNIQUE: Multidetector CT imaging of the head and cervical spine was performed following the standard protocol without intravenous contrast. Multiplanar CT image reconstructions of the cervical spine were also generated. RADIATION DOSE REDUCTION: This exam was performed according to the departmental dose-optimization program which includes automated exposure control, adjustment of the mA and/or  kV according to patient size and/or use of iterative reconstruction technique. COMPARISON:  CT head and CT cervical spine 05/24/2021. FINDINGS: CT HEAD FINDINGS Brain: No evidence of acute infarction, hemorrhage, hydrocephalus, extra-axial collection or mass lesion/mass effect. Vascular: No lower hyperdense vessel identified. Skull: No acute fracture. Sinuses/Orbits: Remote left medial orbital wall fracture. Clear visualized sinuses. No acute orbital findings. Other: No mastoid effusions. CT CERVICAL SPINE FINDINGS Alignment: No substantial sagittal subluxation. Mild broad levocurvature. Skull base and vertebrae: Vertebral body heights are maintained. No evidence of acute fracture. Soft tissues and spinal canal: No prevertebral fluid or swelling. No visible canal hematoma. Disc levels:  Mild degenerative change. Upper chest: Visualized lung apices are clear. IMPRESSION: 1. No evidence of acute intracranial abnormality. 2. No evidence of acute fracture or traumatic malalignment in the cervical spine. Electronically Signed   By: Feliberto Harts M.D.   On: 02/27/2022 08:50  ? ?CT Foot Right Wo  Contrast ? ?Result Date: 02/27/2022 ?CLINICAL DATA:  Swelling EXAM: CT OF THE RIGHT FOOT WITHOUT CONTRAST TECHNIQUE: Multidetector CT imaging of the right foot was performed according to the standard protocol. Multiplanar CT

## 2022-02-27 NOTE — ED Notes (Signed)
Patient refusing for ortho tech to apply CAM boot. Verbally aggressive with staff.  ?

## 2022-02-27 NOTE — ED Notes (Signed)
Pt ambulatory to bathroom but unable to provide urine sample in specimen cup. Will attempt again.  ?

## 2022-02-27 NOTE — ED Triage Notes (Addendum)
Pt to triage via [place] from motel.  Reports R foot pain x 3 days with swelling.  No known injury.   ? ?Pt lethargic.  Will not stay awake to answer questions.  Little response with sternal rub.  EMS states they were unable to get much information from her. ?

## 2022-02-27 NOTE — ED Notes (Signed)
Pt getting upset requesting pain meds and something to eat.  ?

## 2022-02-27 NOTE — ED Notes (Signed)
Pt ambulated to bathroom without assistance. Provided urine sample in hat. Now comfortable back in bed, although continues to fall asleep while I am trying to ask her questions/give directions.  ?

## 2022-02-27 NOTE — Discharge Instructions (Signed)
Use Tylenol every 4 hours and ibuprofen every 6 hours as needed for pain or fever. ?Take antibiotics as prescribed for 1 week.  Return for spreading redness up the leg, persistent fevers, confusion or new concerns. ?

## 2022-02-28 LAB — URINE CULTURE

## 2022-03-01 ENCOUNTER — Emergency Department (HOSPITAL_COMMUNITY): Payer: Medicaid Other

## 2022-03-01 ENCOUNTER — Observation Stay (HOSPITAL_COMMUNITY): Payer: Medicaid Other

## 2022-03-01 ENCOUNTER — Other Ambulatory Visit: Payer: Self-pay

## 2022-03-01 ENCOUNTER — Inpatient Hospital Stay (HOSPITAL_COMMUNITY)
Admission: EM | Admit: 2022-03-01 | Discharge: 2022-03-03 | DRG: 871 | Discharge status: Against Medical Advice | Payer: Medicaid Other | Attending: Internal Medicine | Admitting: Internal Medicine

## 2022-03-01 ENCOUNTER — Encounter (HOSPITAL_COMMUNITY): Payer: Self-pay

## 2022-03-01 DIAGNOSIS — L03115 Cellulitis of right lower limb: Secondary | ICD-10-CM | POA: Diagnosis present

## 2022-03-01 DIAGNOSIS — E876 Hypokalemia: Secondary | ICD-10-CM | POA: Diagnosis present

## 2022-03-01 DIAGNOSIS — G929 Unspecified toxic encephalopathy: Secondary | ICD-10-CM | POA: Diagnosis present

## 2022-03-01 DIAGNOSIS — L0291 Cutaneous abscess, unspecified: Secondary | ICD-10-CM | POA: Diagnosis present

## 2022-03-01 DIAGNOSIS — R609 Edema, unspecified: Secondary | ICD-10-CM

## 2022-03-01 DIAGNOSIS — Z8249 Family history of ischemic heart disease and other diseases of the circulatory system: Secondary | ICD-10-CM

## 2022-03-01 DIAGNOSIS — Z888 Allergy status to other drugs, medicaments and biological substances status: Secondary | ICD-10-CM

## 2022-03-01 DIAGNOSIS — F1410 Cocaine abuse, uncomplicated: Secondary | ICD-10-CM | POA: Diagnosis present

## 2022-03-01 DIAGNOSIS — D509 Iron deficiency anemia, unspecified: Secondary | ICD-10-CM | POA: Diagnosis present

## 2022-03-01 DIAGNOSIS — Z882 Allergy status to sulfonamides status: Secondary | ICD-10-CM | POA: Diagnosis not present

## 2022-03-01 DIAGNOSIS — Z881 Allergy status to other antibiotic agents status: Secondary | ICD-10-CM

## 2022-03-01 DIAGNOSIS — Z8614 Personal history of Methicillin resistant Staphylococcus aureus infection: Secondary | ICD-10-CM | POA: Diagnosis not present

## 2022-03-01 DIAGNOSIS — L02611 Cutaneous abscess of right foot: Secondary | ICD-10-CM | POA: Diagnosis present

## 2022-03-01 DIAGNOSIS — A4102 Sepsis due to Methicillin resistant Staphylococcus aureus: Principal | ICD-10-CM | POA: Diagnosis present

## 2022-03-01 DIAGNOSIS — Z803 Family history of malignant neoplasm of breast: Secondary | ICD-10-CM

## 2022-03-01 DIAGNOSIS — F1910 Other psychoactive substance abuse, uncomplicated: Secondary | ICD-10-CM

## 2022-03-01 DIAGNOSIS — Z5901 Sheltered homelessness: Secondary | ICD-10-CM

## 2022-03-01 DIAGNOSIS — G9340 Encephalopathy, unspecified: Secondary | ICD-10-CM

## 2022-03-01 DIAGNOSIS — L0390 Cellulitis, unspecified: Principal | ICD-10-CM

## 2022-03-01 LAB — CBC WITH DIFFERENTIAL/PLATELET
Abs Immature Granulocytes: 0.1 10*3/uL — ABNORMAL HIGH (ref 0.00–0.07)
Basophils Absolute: 0.1 10*3/uL (ref 0.0–0.1)
Basophils Relative: 0 %
Eosinophils Absolute: 0.3 10*3/uL (ref 0.0–0.5)
Eosinophils Relative: 2 %
HCT: 28.7 % — ABNORMAL LOW (ref 36.0–46.0)
Hemoglobin: 9.5 g/dL — ABNORMAL LOW (ref 12.0–15.0)
Immature Granulocytes: 1 %
Lymphocytes Relative: 11 %
Lymphs Abs: 1.8 10*3/uL (ref 0.7–4.0)
MCH: 23.1 pg — ABNORMAL LOW (ref 26.0–34.0)
MCHC: 33.1 g/dL (ref 30.0–36.0)
MCV: 69.7 fL — ABNORMAL LOW (ref 80.0–100.0)
Monocytes Absolute: 1.6 10*3/uL — ABNORMAL HIGH (ref 0.1–1.0)
Monocytes Relative: 10 %
Neutro Abs: 12.1 10*3/uL — ABNORMAL HIGH (ref 1.7–7.7)
Neutrophils Relative %: 76 %
Platelets: 326 10*3/uL (ref 150–400)
RBC: 4.12 MIL/uL (ref 3.87–5.11)
RDW: 17.8 % — ABNORMAL HIGH (ref 11.5–15.5)
WBC: 16 10*3/uL — ABNORMAL HIGH (ref 4.0–10.5)
nRBC: 0 % (ref 0.0–0.2)

## 2022-03-01 LAB — COMPREHENSIVE METABOLIC PANEL
ALT: 11 U/L (ref 0–44)
AST: 14 U/L — ABNORMAL LOW (ref 15–41)
Albumin: 3.1 g/dL — ABNORMAL LOW (ref 3.5–5.0)
Alkaline Phosphatase: 65 U/L (ref 38–126)
Anion gap: 4 — ABNORMAL LOW (ref 5–15)
BUN: 7 mg/dL (ref 6–20)
CO2: 26 mmol/L (ref 22–32)
Calcium: 8.3 mg/dL — ABNORMAL LOW (ref 8.9–10.3)
Chloride: 109 mmol/L (ref 98–111)
Creatinine, Ser: 0.48 mg/dL (ref 0.44–1.00)
GFR, Estimated: >60 mL/min (ref >60)
Glucose, Bld: 109 mg/dL — ABNORMAL HIGH (ref 70–99)
Potassium: 3.4 mmol/L — ABNORMAL LOW (ref 3.5–5.1)
Sodium: 139 mmol/L (ref 135–145)
Total Bilirubin: 0.5 mg/dL (ref 0.3–1.2)
Total Protein: 6.6 g/dL (ref 6.5–8.1)

## 2022-03-01 LAB — URINALYSIS, ROUTINE W REFLEX MICROSCOPIC
Bacteria, UA: NONE SEEN
Bilirubin Urine: NEGATIVE
Glucose, UA: NEGATIVE mg/dL
Ketones, ur: NEGATIVE mg/dL
Leukocytes,Ua: NEGATIVE
Nitrite: NEGATIVE
Protein, ur: NEGATIVE mg/dL
Specific Gravity, Urine: 1.008 (ref 1.005–1.030)
pH: 6 (ref 5.0–8.0)

## 2022-03-01 LAB — HIV ANTIBODY (ROUTINE TESTING W REFLEX): HIV Screen 4th Generation wRfx: NONREACTIVE

## 2022-03-01 LAB — LACTIC ACID, PLASMA: Lactic Acid, Venous: 0.8 mmol/L (ref 0.5–1.9)

## 2022-03-01 LAB — I-STAT BETA HCG BLOOD, ED (MC, WL, AP ONLY): I-stat hCG, quantitative: 40.5 m[IU]/mL — ABNORMAL HIGH (ref <5)

## 2022-03-01 LAB — APTT: aPTT: 31 seconds (ref 24–36)

## 2022-03-01 LAB — PROTIME-INR
INR: 1 (ref 0.8–1.2)
Prothrombin Time: 13.4 seconds (ref 11.4–15.2)

## 2022-03-01 LAB — HCG, QUANTITATIVE, PREGNANCY: hCG, Beta Chain, Quant, S: <1 m[IU]/mL (ref <5)

## 2022-03-01 LAB — CBG MONITORING, ED: Glucose-Capillary: 119 mg/dL — ABNORMAL HIGH (ref 70–99)

## 2022-03-01 LAB — AMMONIA: Ammonia: 44 umol/L — ABNORMAL HIGH (ref 9–35)

## 2022-03-01 MED ORDER — SODIUM CHLORIDE 0.9 % IV SOLN
2.0000 g | Freq: Once | INTRAVENOUS | Status: AC
  Administered 2022-03-01: 2 g via INTRAVENOUS
  Filled 2022-03-01: qty 20

## 2022-03-01 MED ORDER — ONDANSETRON HCL 4 MG/2ML IJ SOLN
4.0000 mg | Freq: Four times a day (QID) | INTRAMUSCULAR | Status: DC | PRN
  Administered 2022-03-02: 4 mg via INTRAVENOUS
  Filled 2022-03-01: qty 2

## 2022-03-01 MED ORDER — IOHEXOL 300 MG/ML  SOLN
100.0000 mL | Freq: Once | INTRAMUSCULAR | Status: AC | PRN
  Administered 2022-03-01: 100 mL via INTRAVENOUS

## 2022-03-01 MED ORDER — VANCOMYCIN HCL 1.25 G IV SOLR
1250.0000 mg | Freq: Once | INTRAVENOUS | Status: DC
  Filled 2022-03-01: qty 25

## 2022-03-01 MED ORDER — NALOXONE HCL 0.4 MG/ML IJ SOLN: 0.4000 mg | INTRAMUSCULAR | Status: DC | PRN

## 2022-03-01 MED ORDER — MELATONIN 5 MG PO TABS
5.0000 mg | ORAL_TABLET | Freq: Once | ORAL | Status: AC
  Administered 2022-03-01: 5 mg via ORAL
  Filled 2022-03-01: qty 1

## 2022-03-01 MED ORDER — ACETAMINOPHEN 500 MG PO TABS
1000.0000 mg | ORAL_TABLET | Freq: Once | ORAL | Status: AC
  Administered 2022-03-01: 1000 mg via ORAL
  Filled 2022-03-01: qty 2

## 2022-03-01 MED ORDER — ACETAMINOPHEN 500 MG PO TABS
1000.0000 mg | ORAL_TABLET | Freq: Once | ORAL | Status: AC
Start: 2022-03-01 — End: 2022-03-01
  Administered 2022-03-01: 1000 mg via ORAL
  Filled 2022-03-01: qty 2

## 2022-03-01 MED ORDER — VANCOMYCIN HCL IN DEXTROSE 1-5 GM/200ML-% IV SOLN
1000.0000 mg | Freq: Two times a day (BID) | INTRAVENOUS | Status: DC
  Administered 2022-03-01 – 2022-03-03 (×5): 1000 mg via INTRAVENOUS
  Filled 2022-03-01 (×8): qty 200

## 2022-03-01 MED ORDER — ONDANSETRON HCL 4 MG PO TABS
4.0000 mg | ORAL_TABLET | Freq: Four times a day (QID) | ORAL | Status: DC | PRN
  Administered 2022-03-03: 4 mg via ORAL
  Filled 2022-03-01: qty 1

## 2022-03-01 MED ORDER — IBUPROFEN 100 MG/5ML PO SUSP
400.0000 mg | Freq: Three times a day (TID) | ORAL | Status: DC | PRN
  Administered 2022-03-01 – 2022-03-02 (×2): 400 mg via ORAL
  Filled 2022-03-01 (×3): qty 20

## 2022-03-01 MED ORDER — SODIUM CHLORIDE 0.9 % IV SOLN
1.0000 g | INTRAVENOUS | Status: DC
  Administered 2022-03-02 – 2022-03-03 (×2): 1 g via INTRAVENOUS
  Filled 2022-03-01 (×2): qty 10

## 2022-03-01 MED ORDER — SODIUM CHLORIDE 0.9 % IV BOLUS
1000.0000 mL | Freq: Once | INTRAVENOUS | Status: AC
  Administered 2022-03-01: 1000 mL via INTRAVENOUS

## 2022-03-01 MED ORDER — ACETAMINOPHEN 325 MG PO TABS
650.0000 mg | ORAL_TABLET | Freq: Four times a day (QID) | ORAL | Status: DC | PRN
  Administered 2022-03-02 – 2022-03-03 (×4): 650 mg via ORAL
  Filled 2022-03-01 (×4): qty 2

## 2022-03-01 MED ORDER — SODIUM CHLORIDE 0.9 % IV SOLN: INTRAVENOUS | Status: DC

## 2022-03-01 MED ORDER — POTASSIUM CHLORIDE CRYS ER 20 MEQ PO TBCR
20.0000 meq | EXTENDED_RELEASE_TABLET | Freq: Once | ORAL | Status: AC
  Administered 2022-03-01: 20 meq via ORAL
  Filled 2022-03-01: qty 1

## 2022-03-01 MED ORDER — ACETAMINOPHEN 650 MG RE SUPP: 650.0000 mg | Freq: Four times a day (QID) | RECTAL | Status: DC | PRN

## 2022-03-01 MED ORDER — LACTATED RINGERS IV BOLUS
1000.0000 mL | Freq: Once | INTRAVENOUS | Status: AC
  Administered 2022-03-01: 1000 mL via INTRAVENOUS

## 2022-03-01 MED ORDER — SODIUM CHLORIDE 0.9 % IV SOLN: 1.0000 g | INTRAVENOUS | Status: DC

## 2022-03-01 MED ORDER — HEPARIN SODIUM (PORCINE) 5000 UNIT/ML IJ SOLN
5000.0000 [IU] | Freq: Three times a day (TID) | INTRAMUSCULAR | Status: DC
  Administered 2022-03-01 – 2022-03-03 (×8): 5000 [IU] via SUBCUTANEOUS
  Filled 2022-03-01 (×8): qty 1

## 2022-03-01 MED ORDER — IBUPROFEN 100 MG PO CHEW
400.0000 mg | CHEWABLE_TABLET | Freq: Three times a day (TID) | ORAL | Status: DC | PRN
  Filled 2022-03-01: qty 4

## 2022-03-01 MED ORDER — SODIUM CHLORIDE (PF) 0.9 % IJ SOLN
INTRAMUSCULAR | Status: AC
  Filled 2022-03-01: qty 50

## 2022-03-01 NOTE — ED Triage Notes (Signed)
BIB EMS from extended stay hotel with complaints of Rt foot pain, was at [place] 2 days ago, AMS for EMS but does not know baseline, denies drug use ?

## 2022-03-01 NOTE — ED Provider Notes (Signed)
?[facility]-EMERGENCY DEPT ?Provider Note ? ? ?CSN: [id number] ?Arrival date & time: 03/01/22  0115 ? ?  ? ?History ? ?Chief Complaint  ?Patient presents with  ? Altered Mental Status  ? ? ?Leah Barry is a 36 y.o. female. ? ?36 year old female who is seen here couple days ago diagnosed with right foot cellulitis discharged on antibiotics.  She states that her pain has not improved at all.  She states that the swelling and redness and pain seems to have gotten worse so she presents here for further evaluation.  She is very sleepy on exam and difficult to get history but is able to answer questions if you can keep waking her up. ? ? ?Altered Mental Status ? ?  ? ?Home Medications ?Prior to Admission medications   ?Medication Sig Start Date End Date Taking? Authorizing Provider  ?doxycycline (VIBRAMYCIN) 100 MG capsule Take 1 capsule (100 mg total) by mouth 2 (two) times daily for 14 days. 02/27/22 03/13/22  Blane Ohara, MD  ?naloxone [facility]) nasal spray 4 mg/0.1 mL [place] for opiod overdose 05/24/21   Virgina Norfolk, DO  ?   ? ?Allergies    ?Latuda [lurasidone hcl], Sulfa antibiotics, Bee venom, Flagyl [metronidazole], Naproxen, and Tramadol   ? ?Review of Systems   ?Review of Systems ? ?Physical Exam ?Updated Vital Signs ?BP 125/76   Pulse 85   Temp 100 ?F (37.8 ?C) (Rectal)   Resp 16   Ht 5\' 5"  (1.651 m)   Wt 68 kg   SpO2 97%   BMI 24.95 kg/m?  ?Physical Exam ?Vitals and nursing note reviewed.  ?Constitutional:   ?   Appearance: She is well-developed.  ?HENT:  ?   Head: Normocephalic and atraumatic.  ?Cardiovascular:  ?   Rate and Rhythm: Normal rate and regular rhythm.  ?Pulmonary:  ?   Effort: No respiratory distress.  ?   Breath sounds: No stridor.  ?Abdominal:  ?   General: There is no distension.  ?Musculoskeletal:  ?   Cervical back: Normal range of motion.  ?Neurological:  ?   Mental Status: She is alert.  ? ? ?ED Results / Procedures / Treatments   ?Labs ?(all labs ordered  are listed, but only abnormal results are displayed) ?Labs Reviewed  ?COMPREHENSIVE METABOLIC PANEL - Abnormal; Notable for the following components:  ?    Result Value  ? Potassium 3.4 (*)   ? Glucose, Bld 109 (*)   ? Calcium 8.3 (*)   ? Albumin 3.1 (*)   ? AST 14 (*)   ? Anion gap 4 (*)   ? All other components within normal limits  ?CBC WITH DIFFERENTIAL/PLATELET - Abnormal; Notable for the following components:  ? WBC 16.0 (*)   ? Hemoglobin 9.5 (*)   ? HCT 28.7 (*)   ? MCV 69.7 (*)   ? MCH 23.1 (*)   ? RDW 17.8 (*)   ? Neutro Abs 12.1 (*)   ? Monocytes Absolute 1.6 (*)   ? Abs Immature Granulocytes 0.10 (*)   ? All other components within normal limits  ?URINALYSIS, ROUTINE W REFLEX MICROSCOPIC - Abnormal; Notable for the following components:  ? Hgb urine dipstick SMALL (*)   ? All other components within normal limits  ?AMMONIA - Abnormal; Notable for the following components:  ? Ammonia 44 (*)   ? All other components within normal limits  ?I-STAT BETA HCG BLOOD, ED ([facility], [facility], [facility] ONLY) - Abnormal; Notable for the following  components:  ? I-stat hCG, quantitative 40.5 (*)   ? All other components within normal limits  ?CBG MONITORING, ED - Abnormal; Notable for the following components:  ? Glucose-Capillary 119 (*)   ? All other components within normal limits  ?CULTURE, BLOOD (ROUTINE X 2)  ?CULTURE, BLOOD (ROUTINE X 2)  ?URINE CULTURE  ?AEROBIC CULTURE W GRAM STAIN (SUPERFICIAL SPECIMEN)  ?LACTIC ACID, PLASMA  ?PROTIME-INR  ?APTT  ?LACTIC ACID, PLASMA  ?CBC WITH DIFFERENTIAL/PLATELET  ?RAPID URINE DRUG SCREEN, HOSP PERFORMED  ?I-STAT BETA HCG BLOOD, ED ([facility], [facility], [facility] ONLY)  ? ? ?EKG ?None ? ?Radiology ?CT HEAD WO CONTRAST ? ?Result Date: 03/01/2022 ?CLINICAL DATA:  Altered mental status EXAM: CT HEAD WITHOUT CONTRAST TECHNIQUE: Contiguous axial images were obtained from the base of the skull through the vertex without intravenous contrast. RADIATION DOSE REDUCTION: This exam was performed according to the  departmental dose-optimization program which includes automated exposure control, adjustment of the mA and/or kV according to patient size and/or use of iterative reconstruction technique. COMPARISON:  02/27/2022 FINDINGS: Brain: There is no mass, hemorrhage or extra-axial collection. The size and configuration of the ventricles and extra-axial CSF spaces are normal. The brain parenchyma is normal, without acute or chronic infarction. Vascular: No abnormal hyperdensity of the major intracranial arteries or dural venous sinuses. No intracranial atherosclerosis. Skull: The visualized skull base, calvarium and extracranial soft tissues are normal. Sinuses/Orbits: No fluid levels or advanced mucosal thickening of the visualized paranasal sinuses. No mastoid or middle ear effusion. The orbits are normal. IMPRESSION: Normal head CT. Electronically Signed   By: Deatra RobinsonKevin  Herman M.D.   On: 03/01/2022 03:53  ? ?CT Head Wo Contrast ? ?Result Date: 02/27/2022 ?CLINICAL DATA:  altered; altered, foot injury EXAM: CT HEAD WITHOUT CONTRAST CT CERVICAL SPINE WITHOUT CONTRAST TECHNIQUE: Multidetector CT imaging of the head and cervical spine was performed following the standard protocol without intravenous contrast. Multiplanar CT image reconstructions of the cervical spine were also generated. RADIATION DOSE REDUCTION: This exam was performed according to the departmental dose-optimization program which includes automated exposure control, adjustment of the mA and/or kV according to patient size and/or use of iterative reconstruction technique. COMPARISON:  CT head and CT cervical spine 05/24/2021. FINDINGS: CT HEAD FINDINGS Brain: No evidence of acute infarction, hemorrhage, hydrocephalus, extra-axial collection or mass lesion/mass effect. Vascular: No lower hyperdense vessel identified. Skull: No acute fracture. Sinuses/Orbits: Remote left medial orbital wall fracture. Clear visualized sinuses. No acute orbital findings. Other: No  mastoid effusions. CT CERVICAL SPINE FINDINGS Alignment: No substantial sagittal subluxation. Mild broad levocurvature. Skull base and vertebrae: Vertebral body heights are maintained. No evidence of acute fracture. Soft tissues and spinal canal: No prevertebral fluid or swelling. No visible canal hematoma. Disc levels:  Mild degenerative change. Upper chest: Visualized lung apices are clear. IMPRESSION: 1. No evidence of acute intracranial abnormality. 2. No evidence of acute fracture or traumatic malalignment in the cervical spine. Electronically Signed   By: Feliberto HartsFrederick S Jones M.D.   On: 02/27/2022 08:50  ? ?CT Cervical Spine Wo Contrast ? ?Result Date: 02/27/2022 ?CLINICAL DATA:  altered; altered, foot injury EXAM: CT HEAD WITHOUT CONTRAST CT CERVICAL SPINE WITHOUT CONTRAST TECHNIQUE: Multidetector CT imaging of the head and cervical spine was performed following the standard protocol without intravenous contrast. Multiplanar CT image reconstructions of the cervical spine were also generated. RADIATION DOSE REDUCTION: This exam was performed according to the departmental dose-optimization program which includes automated exposure control, adjustment of the mA and/or kV according  to patient size and/or use of iterative reconstruction technique. COMPARISON:  CT head and CT cervical spine 05/24/2021. FINDINGS: CT HEAD FINDINGS Brain: No evidence of acute infarction, hemorrhage, hydrocephalus, extra-axial collection or mass lesion/mass effect. Vascular: No lower hyperdense vessel identified. Skull: No acute fracture. Sinuses/Orbits: Remote left medial orbital wall fracture. Clear visualized sinuses. No acute orbital findings. Other: No mastoid effusions. CT CERVICAL SPINE FINDINGS Alignment: No substantial sagittal subluxation. Mild broad levocurvature. Skull base and vertebrae: Vertebral body heights are maintained. No evidence of acute fracture. Soft tissues and spinal canal: No prevertebral fluid or swelling. No  visible canal hematoma. Disc levels:  Mild degenerative change. Upper chest: Visualized lung apices are clear. IMPRESSION: 1. No evidence of acute intracranial abnormality. 2. No evidence of acute fracture or traumati

## 2022-03-01 NOTE — Assessment & Plan Note (Signed)
UDS shows cocaine. But UDS does not show synthetic opiates such as fentanyl. Pt with pin point pupils and cannot stay awake longer than 5 secs without verbal/physical stimulation.  No utility in getting serum drug screen that detects fentanyl. Will not change her management. ?

## 2022-03-01 NOTE — Progress Notes (Signed)
Patient received from ED via stretcher.  Assisted in position of comfort in bed.  Right foot I&D site OTA, dry at this time.  Swelling noted with erythema around the site, warm to touch.  Patient refused dressing at this time.  Oriented to room and unit routine.  Needs addressed. ?
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
## 2022-03-01 NOTE — Progress Notes (Signed)
RLE venous duplex has been completed. ? ? ?Results can be found under chart review under CV PROC. ?03/01/2022 9:59 AM ?Rodolfo Notaro RVT, RDMS ? ?

## 2022-03-01 NOTE — ED Notes (Signed)
Patient transported to CT 

## 2022-03-01 NOTE — Progress Notes (Signed)
I-stat HCG is positive. Now 40. Was 7 about 2 days ago. Will check serum hcg. Cancel CT foot. Pharmacy to adjust vanco dosing. ? ?

## 2022-03-01 NOTE — Progress Notes (Signed)
Pharmacy Antibiotic Note ? ?Leah Barry is a 36 y.o. female admitted on 03/01/2022 with right foot abscess.  Medical history significant for polysubstance abuse and I-stat HCG is positive.  Pharmacy has been consulted for Vancomycin dosing. ? ?Plan: ?Vancomycin 1000 mg IV Q 12 hrs. Goal AUC 400-550.  Expected AUC: 535.3  SCr used: 0.8 (rounded up from 0.48) ?Ceftriaxone per MD ?F/u culture results and sensitivities ? ? ?Height: 5\' 5"  (165.1 cm) ?Weight: 68 kg (149 lb 14.6 oz) ?IBW/kg (Calculated) : 57 ? ?Temp (24hrs), Avg:99.9 ?F (37.7 ?C), Min:99.7 ?F (37.6 ?C), Max:100 ?F (37.8 ?C) ? ?Recent Labs  ?Lab 02/27/22 ?0810 03/01/22 ?0130 03/01/22 ?0300  ?WBC 15.8* 16.0*  --   ?CREATININE 0.49 0.48  --   ?LATICACIDVEN 1.2  --  0.8  ?  ?Estimated Creatinine Clearance: 86.6 mL/min (by C-G formula based on SCr of 0.48 mg/dL).   ? ?Allergies  ?Allergen Reactions  ? Latuda [Lurasidone Hcl] Palpitations and Other (See Comments)  ?  Hot flashes, tremors  ? Sulfa Antibiotics Swelling  ?  SWELLING OF EYES  ? Bee Venom Swelling  ?  "Bees make my eyes swell."  ? Flagyl [Metronidazole] Nausea And Vomiting  ?  tablets causes reaction ?Topical is ok  ? Naproxen Other (See Comments)  ?  NOSE BLEED  ? Tramadol Itching and Nausea And Vomiting  ? ? ?Antimicrobials this admission: ?4/19 Ceftriaxone >>   ?4/19 Vancomycin >>   ? ?Dose adjustments this admission: ?  ? ?Microbiology results: ?4/19 BCx:   ?4/19 UCx:    ?  ? ?Thank you for allowing pharmacy to be a part of this patient?s care. ? ?5/19, PharmD ?03/01/2022 5:11 AM ? ?

## 2022-03-01 NOTE — Progress Notes (Signed)
Patient seen and examined personally, I reviewed the chart, history and physical and admission note, done by admitting physician this morning and agree with the same with following addendum.  Please refer to the morning admission note for more detailed plan of care. ? ?Briefly,  ? ?36 yo AAF with hx of right foot cellulitis who was seen in ED on 02-27-2022 and discharged with Rx for doxycycline, Unknown if pt got Rx filled presented to the ED 4/16 night from [facility] with continued pain and swelling as per report, patient has been consistently drowsy and falls asleep quickly unable to obtain HPI in the ED, lab work showed ? ?WBC 16K. Was 15 K on 02-27-2022. Lactic acid 0.8 and had I@d  of rt foot. ?WBC elevated at 16.0 normal lactic acid, CK stable. ?On exam this morning she is alert awake told me she is in the hospital because of her right foot not getting better.  Reports she did cocaine recently and when asked about fentanyl she did it 1 time but would not tell me the timeline. ?She reports she is hungry and wants to eat. ? ?issues ?Abscess of right foot: s/p I&D on lateral aspect of right foot with drainage of abscess. ?on IV fluids ceftriaxone vancomycin.s/p duplex of lower extremity with no evidence of DVT showed enlarged lymph nodes in the groin.Culture from the drain with adherent gram-positive cocci.  CT right foot with contrast shows marked subcutaneous edema worsening since 4/17 no soft tissue fluid collection, no bony abnormalities.  Area muscles and tendons unremarkable.  Continue pain control with Tylenol. ? ?Polysubstance abuse-UDS positive for cocaine does not show 730 opiates versus fentanyl patient had pinpoint pupils and was very sleepy and drowsy.  Minimize narcotics. ?Mild hypokalemia replete po. ?I-STAT beta-hCG positive  7.3 4/17 > 40.5 4/19, but serum hCG quantitative < 1 ? ?

## 2022-03-01 NOTE — Assessment & Plan Note (Signed)
Observation medical bed. EDP has performed I&D of abscess of lateral aspect of right foot. Will check LE U/S to rule out DVT due to swelling edema of right leg. Also check CT right foot with IV contrast to rule out deeper abscess that would need podiatry intervention. IV rocephin and IV Vanco. ?

## 2022-03-01 NOTE — ED Notes (Signed)
Crackers provided.

## 2022-03-01 NOTE — Subjective & Objective (Addendum)
CC: right foot pain ?HPI: ?36 yo AAF with hx of right foot cellulitis. Seen in ED on 02-27-2022. Discharged with Rx for doxycycline. Unknown if pt got Rx filled. [place]ed [place] ER tonight [place] motel with continued pain and swelling per triage notes.  Pt consistently drowsy and falls asleep quickly. Unable to obtain HPI from patient due to excessive sedation. ? ?WBC 16K. Was 15 K on 02-27-2022. ?Lactic acid 0.8 ? ?EDP able to perform I&D on lateral aspect of right foot with drainage of abscess. ? ?TRH consulted for admission. ?

## 2022-03-02 DIAGNOSIS — G9340 Encephalopathy, unspecified: Secondary | ICD-10-CM

## 2022-03-02 DIAGNOSIS — D509 Iron deficiency anemia, unspecified: Secondary | ICD-10-CM

## 2022-03-02 DIAGNOSIS — L02611 Cutaneous abscess of right foot: Secondary | ICD-10-CM | POA: Diagnosis not present

## 2022-03-02 LAB — CBC
HCT: 27.3 % — ABNORMAL LOW (ref 36.0–46.0)
Hemoglobin: 8.7 g/dL — ABNORMAL LOW (ref 12.0–15.0)
MCH: 22.8 pg — ABNORMAL LOW (ref 26.0–34.0)
MCHC: 31.9 g/dL (ref 30.0–36.0)
MCV: 71.5 fL — ABNORMAL LOW (ref 80.0–100.0)
Platelets: 329 10*3/uL (ref 150–400)
RBC: 3.82 MIL/uL — ABNORMAL LOW (ref 3.87–5.11)
RDW: 18 % — ABNORMAL HIGH (ref 11.5–15.5)
WBC: 11.1 10*3/uL — ABNORMAL HIGH (ref 4.0–10.5)
nRBC: 0 % (ref 0.0–0.2)

## 2022-03-02 LAB — URINE CULTURE: Culture: NO GROWTH

## 2022-03-02 LAB — COMPREHENSIVE METABOLIC PANEL
ALT: 10 U/L (ref 0–44)
AST: 14 U/L — ABNORMAL LOW (ref 15–41)
Albumin: 2.8 g/dL — ABNORMAL LOW (ref 3.5–5.0)
Alkaline Phosphatase: 54 U/L (ref 38–126)
Anion gap: 5 (ref 5–15)
BUN: 9 mg/dL (ref 6–20)
CO2: 24 mmol/L (ref 22–32)
Calcium: 8.3 mg/dL — ABNORMAL LOW (ref 8.9–10.3)
Chloride: 112 mmol/L — ABNORMAL HIGH (ref 98–111)
Creatinine, Ser: 0.43 mg/dL — ABNORMAL LOW (ref 0.44–1.00)
GFR, Estimated: >60 mL/min (ref >60)
Glucose, Bld: 100 mg/dL — ABNORMAL HIGH (ref 70–99)
Potassium: 3.9 mmol/L (ref 3.5–5.1)
Sodium: 141 mmol/L (ref 135–145)
Total Bilirubin: 0.3 mg/dL (ref 0.3–1.2)
Total Protein: 6.5 g/dL (ref 6.5–8.1)

## 2022-03-02 LAB — RAPID URINE DRUG SCREEN, HOSP PERFORMED
Amphetamines: NOT DETECTED
Barbiturates: NOT DETECTED
Benzodiazepines: NOT DETECTED
Cocaine: POSITIVE — AB
Opiates: NOT DETECTED
Tetrahydrocannabinol: NOT DETECTED

## 2022-03-02 MED ORDER — HYDROXYZINE HCL 25 MG PO TABS
25.0000 mg | ORAL_TABLET | Freq: Three times a day (TID) | ORAL | Status: DC | PRN
  Administered 2022-03-02 – 2022-03-03 (×5): 25 mg via ORAL
  Filled 2022-03-02 (×5): qty 1

## 2022-03-02 NOTE — Plan of Care (Signed)
  Problem: Education: Goal: Knowledge of General Education information will improve Description: Including pain rating scale, medication(s)/side effects and non-pharmacologic comfort measures Outcome: Progressing   Problem: Pain Managment: Goal: General experience of comfort will improve Outcome: Progressing   Problem: Safety: Goal: Ability to remain free from injury will improve Outcome: Progressing   

## 2022-03-02 NOTE — Progress Notes (Signed)
?PROGRESS NOTE ?Leah Barry  AOZ:[id number] DOB: 07/28/1986 DOA: 03/01/2022 ?PCP: Pcp, No  ? ?Brief Narrative/Hospital Course: ?36 yo AAF with hx of right foot cellulitis who was seen in ED on 02-27-2022 and discharged with Rx for doxycycline, Unknown if pt got Rx filled presented to the ED 4/16 night [place] with continued pain and swelling as per report, patient has been consistently drowsy and falls asleep quickly unable to obtain HPI in the ED, lab work showed  WBC 16K. Was 15 K on 02-27-2022. Lactic acid 0.8 and had I@d  of rt foot.  CT of the right foot with contrast with marked subcutaneous edema soft tissue fluid collection no bony abnormalities.  Placed on IV antibiotics and admitted for further management  ?  ?Subjective: ?Seen and examined this morning.  She was sleeping woke up and interactive.  Complains of some pain in the right lower extremity.  Reports she is homeless and wants to talk with Child psychotherapistsocial worker. ?Overnight no fever heart rate blood pressure stable ?Labs this morning with improving WBC count stable renal function, microcytic anemia ? ?Assessment and Plan: ?Principal Problem: ?  Abscess of right foot ?Active Problems: ?  Polysubstance abuse (HCC) ?  Abscess ?  Microcytic anemia ?  Acute encephalopathy ?  ?Abscess of right foot: s/p I&D on lateral aspect of right foot with drainage of abscess. ?Leukocytosis improving, drain culture showing Staphylococcus aureus- f/u cx.  Clinically right lower extremity appears less swollen less tender. Cont ceftriaxone vancomycin for now. Duplex of lower extremity with no evidence of DVT showed enlarged lymph nodes in the groin.CT right foot with contrast shows marked subcutaneous edema worsening since 4/17 no soft tissue fluid collection, no bony abnormalities.  Area muscles and tendons unremarkable. Continue pain control with Tylenol/nsaid-avoid narcotics ?  ?Polysubstance abuse-UDS positive for cocaine does not show 730 opiates versus fentanyl  patient had pinpoint pupils and was very sleepy and drowsy.  Minimize narcotics. ?Acute toxic encephalopathy patient was somnolent on admission at this time she is alert awake oriented.  She attributes to not getting enough sleep prior to coming to the hospital. ?Mild hypokalemia repleted ?Microcytic anemia monitor hemoglobin check anemia panel ?I-STAT beta-hCG positive  7.3 4/17 > 40.5 4/19, but serum hCG quantitative < 1. ? ?DVT prophylaxis: heparin injection 5,000 Units Start: 03/01/22 0600 ?SCDs Start: 03/01/22 0510 ?Code Status:   Code Status: Full Code ?Family Communication: plan of care discussed with patient at bedside. ?Patient status is: inpatient Level of care: Med-Surg  ?Remains inpatient because: ongoing iv antibiotics ?Patient currently not stable ? ?Dispo: The patient is from: home-[place] ?           Anticipated disposition: home ? ?Mobility Assessment (last 72 hours)   ? ? Mobility Assessment   ? ? Row Name 03/02/22 0000  ?  ?  ?  ?  ? Does patient have an order for bedrest or is patient medically unstable No - Continue assessment      ? What is the highest level of mobility based on the progressive mobility assessment? Level 5 (Walks with assist in room/hall) - Balance while stepping forward/back and can walk in room with assist - Complete      ? ?  ?  ? ?  ?  ? ?Objective: ?Vitals last 24 hrs: ?Vitals:  ? 03/01/22 2202 03/02/22 0331 03/02/22 0500 03/02/22 0639  ?BP: 112/60 131/83  137/82  ?Pulse: 84   79  ?Resp: 17 16  18   ?Temp: 98.5 ?  F (36.9 ?C) 98.6 ?F (37 ?C)  98.6 ?F (37 ?C)  ?TempSrc: Oral Oral  Oral  ?SpO2: 100% 98%  100%  ?Weight:   68.1 kg   ?Height:      ? ?Weight change: 0.1 kg ? ?Physical Examination: ?General exam: AA0x3,older than stated age, weak appearing. ?HEENT:Oral mucosa moist, Ear/Nose WNL grossly, dentition normal. ?Respiratory system: bilaterally diminished BS, no use of accessory muscle ?Cardiovascular system: S1 & S2 +, No JVD,. ?Gastrointestinal system: Abdomen soft,NT,ND,  BS+ ?Nervous System:Alert, awake, moving extremities and grossly nonfocal ?Extremities: LE edema mild on rt foot with some tenderness,distal peripheral pulses palpable.  ?Skin: No rashes,no icterus. ?MSK: Normal muscle bulk,tone, power ? ?Medications reviewed:  ?Scheduled Meds: ? heparin  5,000 Units Subcutaneous Q8H  ? ?Continuous Infusions: ? sodium chloride 125 mL/hr at 03/02/22 0439  ? cefTRIAXone (ROCEPHIN)  IV 1 g (03/02/22 0441)  ? vancomycin 1,000 mg (03/02/22 0558)  ? ? ?  ?Diet Order   ? ?       ?  Diet regular Room service appropriate? Yes; Fluid consistency: Thin  Diet effective now       ?  ? ?  ?  ? ?  ?  ? ?  ?  ?  ? ? ?Intake/Output Summary (Last 24 hours) at 03/02/2022 1016 ?Last data filed at 03/02/2022 0600 ?Gross per 24 hour  ?Intake 2247.65 ml  ?Output --  ?Net 2247.65 ml  ? ?Net IO Since Admission: 4,347.65 mL [03/02/22 1016]  ?Wt Readings from Last 3 Encounters:  ?03/02/22 68.1 kg  ?12/23/21 68 kg  ?05/24/21 79.4 kg  ?  ? ?Unresulted Labs (From admission, onward)  ? ?  Start     Ordered  ? 03/03/22 0500  Vitamin B12  (Anemia Panel (PNL))  Tomorrow morning,   R       ? 03/02/22 0816  ? 03/03/22 0500  Folate  (Anemia Panel (PNL))  Tomorrow morning,   R       ? 03/02/22 0816  ? 03/03/22 0500  Iron and TIBC  (Anemia Panel (PNL))  Tomorrow morning,   R       ? 03/02/22 0816  ? 03/03/22 0500  Ferritin  (Anemia Panel (PNL))  Tomorrow morning,   R       ? 03/02/22 0816  ? 03/03/22 0500  Reticulocytes  (Anemia Panel (PNL))  Tomorrow morning,   R       ? 03/02/22 0816  ? 03/02/22 0500  CBC  Daily,   R     ? 03/01/22 1143  ? 03/01/22 0221  Rapid urine drug screen (hospital performed)  ONCE - STAT,   STAT       ? 03/01/22 0220  ? ?  ?  ? ?  ?Data Reviewed: I have personally reviewed following labs and imaging studies ?CBC: ?Recent Labs  ?Lab 03-17-2022 ?0810 2022/03/17 ?0842 03/01/22 ?0130 03/02/22 ?8413  ?WBC 15.8*  --  16.0* 11.1*  ?NEUTROABS 11.7*  --  12.1*  --   ?HGB 9.8* 10.5* 9.5* 8.7*  ?HCT 30.1* 31.0*  28.7* 27.3*  ?MCV 69.8*  --  69.7* 71.5*  ?PLT 309  --  326 329  ? ?Basic Metabolic Panel: ?Recent Labs  ?Lab 2022-03-17 ?0810 03-17-22 ?0842 03/01/22 ?0130 03/02/22 ?2440  ?NA 137 137 139 141  ?K 3.7 3.8 3.4* 3.9  ?CL 105  --  109 112*  ?CO2 22  --  26 24  ?GLUCOSE 112*  --  109*  100*  ?BUN 6  --  7 9  ?CREATININE 0.49  --  0.48 0.43*  ?CALCIUM 8.7*  --  8.3* 8.3*  ? ?GFR: ?Estimated Creatinine Clearance: 86.6 mL/min (A) (by C-G formula based on SCr of 0.43 mg/dL (L)). ?Liver Function Tests: ?Recent Labs  ?Lab 02/27/22 ?0810 03/01/22 ?0130 03/02/22 ?5916  ?AST 18 14* 14*  ?ALT 12 11 10   ?ALKPHOS 64 65 54  ?BILITOT 0.7 0.5 0.3  ?PROT 7.0 6.6 6.5  ?ALBUMIN 3.7 3.1* 2.8*  ? ?No results for input(s): LIPASE, AMYLASE in the last 168 hours. ?Recent Labs  ?Lab 02/27/22 ?0810 03/01/22 ?0251  ?AMMONIA 44* 44*  ? ?Coagulation Profile: ?Recent Labs  ?Lab 03/01/22 ?0130  ?INR 1.0  ? ?BNP (last 3 results) ?No results for input(s): PROBNP in the last 8760 hours. ?HbA1C: ?No results for input(s): HGBA1C in the last 72 hours. ?CBG: ?Recent Labs  ?Lab 03/01/22 ?0320  ?GLUCAP 119*  ? ?Lipid Profile: ?No results for input(s): CHOL, HDL, LDLCALC, TRIG, CHOLHDL, LDLDIRECT in the last 72 hours. ?Thyroid Function Tests: ?No results for input(s): TSH, T4TOTAL, FREET4, T3FREE, THYROIDAB in the last 72 hours. ?Sepsis Labs: ?Recent Labs  ?Lab 02/27/22 ?0810 03/01/22 ?0300  ?LATICACIDVEN 1.2 0.8  ? ? ?Recent Results (from the past 240 hour(s))  ?Urine Culture     Status: Abnormal  ? Collection Time: 02/27/22 10:30 AM  ? Specimen: In/Out Cath Urine  ?Result Value Ref Range Status  ? Specimen Description IN/OUT CATH URINE  Final  ? Special Requests   Final  ?  NONE ?Performed at [facility], [address], [place], [place] ?  ? Culture MULTIPLE SPECIES PRESENT, SUGGEST RECOLLECTION (A)  Final  ? Report Status 02/28/2022 FINAL  Final  ?Blood Culture (routine x 2)     Status: None (Preliminary result)  ? Collection Time: 03/01/22   2:51 AM  ? Specimen: BLOOD  ?Result Value Ref Range Status  ? Specimen Description   Final  ?  BLOOD RIGHT HAND ?Performed at [facility], [address], [place], [place] ?

## 2022-03-02 NOTE — Hospital Course (Signed)
36 yo AAF with hx of right foot cellulitis who was seen in ED on 02-27-2022 and discharged with Rx for doxycycline, Unknown if pt got Rx filled presented to the ED 4/16 night from [place] with continued pain and swelling as per report, patient has been consistently drowsy and falls asleep quickly unable to obtain HPI in the ED, lab work showed  WBC 16K. Was 15 K on 02-27-2022. Lactic acid 0.8 and had I@d  of rt foot.  CT of the right foot with contrast with marked subcutaneous edema soft tissue fluid collection no bony abnormalities.  Placed on IV antibiotics and admitted for further management ?

## 2022-03-02 NOTE — TOC Initial Note (Signed)
Transition of Care (TOC) - Initial/Assessment Note  ? ?Patient Details  ?Name: Leah Barry ?MRN: [id number] ?Date of Birth: [birth year] ? ?Transition of Care (TOC) CM/SW Contact:    ?Sherie Don, LCSW ?Phone Number: ?03/02/2022, 1:21 PM ? ?Clinical Narrative: TOC notified that patient may be in need a homelessness resources. CSW spoke with patient regarding shelter and [facility] resources. Patient reported she currently resides alone in hotel/motel room where she sleeps on the floor. CSW attempted to discuss shelter and [facility] resources, but patient became agitated stating, "this is a long-term thing" in regards to her foot infection and reported she would not be able to use any of the resources because she cannot stand on her foot. ? ?Patient asked CSW to call DSS for "housing vouchers" for her. CSW explained patient will need to call DSS herself, but before CSW could provide the phone number to the patient, she became angry stating, "I don't have a phone" and hung up the phone. Shelter resources and the contact information for [place] have been added to the AVS.            ? ?Expected Discharge Plan: Home/Self Care ?Barriers to Discharge: Continued Medical Work up ? ?Patient Goals and CMS Choice ?Choice offered to / list presented to : NA ? ?Expected Discharge Plan and Services ?Expected Discharge Plan: Home/Self Care ?In-house Referral: Clinical Social Work ?Post Acute Care Choice: NA ?Living arrangements for the past 2 months: Hotel/Motel           ?DME Arranged: N/A ?DME Agency: NA ? ?Prior Living Arrangements/Services ?Living arrangements for the past 2 months: Hotel/Motel ?Lives with:: Self ?Patient language and need for interpreter reviewed:: Yes ?Need for Family Participation in Patient Care: No (Comment) ?Care giver support system in place?: Yes (comment) ?Criminal Activity/Legal Involvement Pertinent to Current Situation/Hospitalization: No - Comment as needed ? ?Activities of Daily Living ?Home  Assistive Devices/Equipment: None ?ADL Screening (condition at time of admission) ?Patient's cognitive ability adequate to safely complete daily activities?: Yes ?Is the patient deaf or have difficulty hearing?: No ?Does the patient have difficulty seeing, even when wearing glasses/contacts?: No ?Does the patient have difficulty concentrating, remembering, or making decisions?: No ?Patient able to express need for assistance with ADLs?: Yes ?Does the patient have difficulty dressing or bathing?: No ?Independently performs ADLs?: Yes (appropriate for developmental age) ?Does the patient have difficulty walking or climbing stairs?: Yes ?Weakness of Legs: Right ?Weakness of Arms/Hands: None ? ?Emotional Assessment ?Attitude/Demeanor/Rapport: Uncooperative ?Affect (typically observed): Frustrated ?Orientation: : Oriented to Self, Oriented to Place, Oriented to  Time, Oriented to Situation ?Alcohol / Substance Use: Tobacco Use ? ?Admission diagnosis:  Abscess [L02.91] ?Abscess of right foot [L02.611] ?Cellulitis, unspecified cellulitis site [L03.90] ?Patient Active Problem List  ? Diagnosis Date Noted  ? Microcytic anemia 03/02/2022  ? Acute encephalopathy 03/02/2022  ? Abscess of right foot 03/01/2022  ? Polysubstance abuse ([place]) 03/01/2022  ? Abscess 03/01/2022  ? Dysmenorrhea 05/28/2014  ? Abnormal uterine bleeding (AUB) 09/10/2013  ? PID (pelvic inflammatory disease) 07/29/2013  ? Screening examination for venereal disease 07/29/2013  ? Chronic PID (chronic pelvic inflammatory disease) 06/30/2013  ? Excessive or frequent menstruation 06/30/2013  ? Genital herpes, unspecified 06/30/2013  ? Unspecified inflammatory disease of female pelvic organs and tissues 03/12/2013  ? ?PCP:  Pcp, No ?Pharmacy:   ?[facility] [address] [address] ?[place] [address] ?Phone: [phone]  Fax: [phone] ? ?[phone] -  [place], [address] ?[address] ?[place] [address] ?Phone: [phone] Fax: [phone] ? ?Readmission Risk Interventions ?   ? View : No data to display.  ?  ?  ?  ? ?

## 2022-03-03 DIAGNOSIS — L02611 Cutaneous abscess of right foot: Secondary | ICD-10-CM

## 2022-03-03 LAB — CBC
HCT: 26.9 % — ABNORMAL LOW (ref 36.0–46.0)
Hemoglobin: 8.6 g/dL — ABNORMAL LOW (ref 12.0–15.0)
MCH: 22.8 pg — ABNORMAL LOW (ref 26.0–34.0)
MCHC: 32 g/dL (ref 30.0–36.0)
MCV: 71.2 fL — ABNORMAL LOW (ref 80.0–100.0)
Platelets: 354 10*3/uL (ref 150–400)
RBC: 3.78 MIL/uL — ABNORMAL LOW (ref 3.87–5.11)
RDW: 18.1 % — ABNORMAL HIGH (ref 11.5–15.5)
WBC: 9.3 10*3/uL (ref 4.0–10.5)
nRBC: 0 % (ref 0.0–0.2)

## 2022-03-03 LAB — IRON AND TIBC
Iron: 25 ug/dL — ABNORMAL LOW (ref 28–170)
Saturation Ratios: 8 % — ABNORMAL LOW (ref 10.4–31.8)
TIBC: 298 ug/dL (ref 250–450)
UIBC: 273 ug/dL

## 2022-03-03 LAB — AEROBIC CULTURE W GRAM STAIN (SUPERFICIAL SPECIMEN)

## 2022-03-03 LAB — RETICULOCYTES
Immature Retic Fract: 23.2 % — ABNORMAL HIGH (ref 2.3–15.9)
RBC.: 3.73 MIL/uL — ABNORMAL LOW (ref 3.87–5.11)
Retic Count, Absolute: 68.6 10*3/uL (ref 19.0–186.0)
Retic Ct Pct: 1.8 % (ref 0.4–3.1)

## 2022-03-03 LAB — VITAMIN B12: Vitamin B-12: 282 pg/mL (ref 180–914)

## 2022-03-03 LAB — FERRITIN: Ferritin: 10 ng/mL — ABNORMAL LOW (ref 11–307)

## 2022-03-03 LAB — FOLATE: Folate: 11.9 ng/mL (ref >5.9)

## 2022-03-03 MED ORDER — FERROUS SULFATE 325 (65 FE) MG PO TABS
325.0000 mg | ORAL_TABLET | Freq: Two times a day (BID) | ORAL | Status: DC
  Administered 2022-03-03 (×2): 325 mg via ORAL
  Filled 2022-03-03 (×2): qty 1

## 2022-03-03 MED ORDER — HYDROCODONE-ACETAMINOPHEN 5-325 MG PO TABS: 1.0000 | ORAL_TABLET | Freq: Four times a day (QID) | ORAL | Status: DC | PRN

## 2022-03-03 MED ORDER — KETOROLAC TROMETHAMINE 15 MG/ML IJ SOLN: 15.0000 mg | Freq: Four times a day (QID) | INTRAMUSCULAR | Status: DC | PRN

## 2022-03-03 MED ORDER — IBUPROFEN 400 MG PO TABS
400.0000 mg | ORAL_TABLET | Freq: Four times a day (QID) | ORAL | Status: DC | PRN
  Administered 2022-03-03: 400 mg via ORAL
  Filled 2022-03-03: qty 1
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
## 2022-03-03 NOTE — Plan of Care (Addendum)
Rather anxious, endorsing dysuria, she suspects she might have an STI. she has some discharge, sx have been ongoing for over a month. UA and UCx were negative on the 19th and WBC now wnl. Impending GC/ chlamydia swab. Diarrhea x 1 this shift. RLE pain managed w/ APAP and Ibuprofen, Toradol added to regimen.  ? ? ?Problem: Education: ?Goal: Knowledge of General Education information will improve ?Description: Including pain rating scale, medication(s)/side effects and non-pharmacologic comfort measures ?Outcome: Progressing ?  ?Problem: Health Behavior/Discharge Planning: ?Goal: Ability to manage health-related needs will improve ?Outcome: Progressing ?  ?Problem: Clinical Measurements: ?Goal: Ability to maintain clinical measurements within normal limits will improve ?Outcome: Progressing ?Goal: Will remain free from infection ?Outcome: Progressing ?Goal: Diagnostic test results will improve ?Outcome: Progressing ?Goal: Respiratory complications will improve ?Outcome: Progressing ?Goal: Cardiovascular complication will be avoided ?Outcome: Progressing ?  ?Problem: Activity: ?Goal: Risk for activity intolerance will decrease ?Outcome: Progressing ?  ?Problem: Nutrition: ?Goal: Adequate nutrition will be maintained ?Outcome: Progressing ?  ?Problem: Coping: ?Goal: Level of anxiety will decrease ?Outcome: Progressing ?  ?Problem: Elimination: ?Goal: Will not experience complications related to bowel motility ?Outcome: Progressing ?Goal: Will not experience complications related to urinary retention ?Outcome: Progressing ?  ?Problem: Pain Managment: ?Goal: General experience of comfort will improve ?Outcome: Progressing ?  ?Problem: Safety: ?Goal: Ability to remain free from injury will improve ?Outcome: Progressing ?  ?Problem: Skin Integrity: ?Goal: Risk for impaired skin integrity will decrease ?Outcome: Progressing ?  ?
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
## 2022-03-03 NOTE — Progress Notes (Signed)
S/p I&D by podiatry, reports pain 10/10, asking for stronger pain agent. Refused toradol. ?

## 2022-03-04 NOTE — Progress Notes (Signed)
Patient extremely agitated, states she has been a great patient all along and has been compliant but is now in extreme pain. She demanded IV opioids and wanted this done right away. MD was contacted via secure chat. However, patient requested to leave prior to MD response. I had a prolonged conversation in attempt to keep pt in-house. All potential untoward outcomes including but not limited to worsening of her LE wound/infection, sepsis, deformity and even death were unsuccessfully expressed to patient and family who were at the bedside. Patient states she will present to a hospital close to family in [place]. All required AMA documentation were filled and patient transported to exit via wheelchair.  ?

## 2022-03-04 NOTE — Discharge Summary (Signed)
? ? ? ?                                                    AMA ? ?Found out that patient expressed desire to leave the Hospital immediately based on documentation on 4/21 after hours- patient wad warned that this is not Medically advisable at this time, and can result in Medical complications like Death and Disability, patient the nsigned the AMA papers accepting the risks involved and assuming full responsibilty of the decision and left the facility- please see RN note from 4/21/ pm. I had previously during the daytime ropunding expressed her that she should stay in the hospital and complete the course of treatment otherwise he will risk worsening of the infection sepsis which could lead to significant morbidity loss of limb or even life and she had acknowledged/verbalized those understanding ?Of note she had I and D done by podaitry ? ?Lanae Boast M.D on 03/04/2022 at 7:26 AM ? ?[facility] ? ?Time < 30 minutes ? ?Last Note Below  ?Sent ?PROGRESS NOTE ?Leah Barry  ONG:[id number] DOB: January 10, 1986 DOA: 03/01/2022 ?PCP: Pcp, No  ? ?Brief Narrative/Hospital Course: ?36 yo AAF with hx of right foot cellulitis who was seen in ED on 02-27-2022 and discharged with Rx for doxycycline, Unknown if pt got Rx filled presented to the ED 4/16 night from [facility] with continued pain and swelling as per report, patient has been consistently drowsy and falls asleep quickly unable to obtain HPI in the ED, lab work showed  WBC 16K. Was 15 K on 02-27-2022. Lactic acid 0.8 and had I@d  of rt foot.  CT of the right foot with contrast with marked subcutaneous edema soft tissue fluid collection no bony abnormalities.  Placed on IV antibiotics and admitted for further management  ?  ?Subjective: ?No fever overnight ?Some dysurea complaints ?" Give me some real pain medicine, that s..... tylenol and ibuproen aint workin" ?Having her meal. ?Discussed  about avoiding narcotics and added Toradol, she is agreeable ? ?Assessment and Plan: ?Principal Problem: ?  Abscess of right foot ?Active Problems: ?  Polysubstance abuse (HCC) ?  Abscess ?  Microcytic anemia ?  Acute encephalopathy ?  ?Abscess of right foot from staph aures: s/p I&D on lateral aspect of right foot with drainage of abscess-was draining but not anymore-right foot lateral area he still has some swelling and tenderness dorsum of the foot swelling and erythema improving.Wound culture w/ Staphylococcus aureus  C/S pending. Duplex of lower extremity with no evidence of DVT showed enlarged lymph nodes in the groin.CT right foot with contrast shows marked subcutaneous edema worsening since 4/17 no soft tissue fluid collection, no bony abnormalities. On ceftriaxone vancomycin.Continue pain control with Tylenol/nsaid-avoid narcotics- added iv toradol. Will d/w podiatry if need to be have I and D done ?  ?Polysubstance abuse-UDS positive for cocaine ?Acute toxic encephalopathy patient was somnolent on admission at this time she is alert awake oriented.  She attributes to not getting enough sleep prior to coming to the hospital. ?Mild hypokalemia repleted ?Microcytic anemia/iron deficiency anemia:monitor hemoglobin checked anemia panel-started on iron supplementation ?Dysuria- UA on admit normal.ordered GC panel ?I-STAT beta-hCG positive  7.3 4/17 > 40.5 4/19, but serum hCG quantitative < 1. ? ?DVT prophylaxis:  ?Code Status:   Code Status: Prior ?Family Communication: plan of  care discussed with patient at bedside. ?Patient status is: inpatient Level of care: Med-Surg  ?Remains inpatient because: ongoing iv antibiotics ?Patient currently not stable ? ?Dispo: The patient is from: [facility] ?           Anticipated disposition: home ? ?Mobility Assessment (last 72 hours)   ? ? Mobility Assessment   ? ? Row Name 03/02/22 2222 03/02/22 0000  ?  ?  ?  ? Does patient have an order for bedrest or is patient medically  unstable No - Continue assessment No - Continue assessment     ? What is the highest level of mobility based on the progressive mobility assessment? Level 5 (Walks with assist in room/hall) - Balance while stepping forward/back and can walk in room with assist - Complete Level 5 (Walks with assist in room/hall) - Balance while stepping forward/back and can walk in room with assist - Complete     ? ?  ?  ? ?  ?  ? ?Objective: ?Vitals last 24 hrs: ?Vitals:  ? 03/02/22 2104 03/03/22 0500 03/03/22 0611 03/03/22 1300  ?BP: 136/82  138/83 (!) 146/99  ?Pulse: 81  76 87  ?Resp: 17  17 18   ?Temp: 98.3 ?F (36.8 ?C)  98.4 ?F (36.9 ?C)   ?TempSrc: Oral  Oral   ?SpO2: 100%  100% 100%  ?Weight:  82 kg    ?Height:      ? ?Weight change:  ? ?Physical Examination: ?General exam: AA0X3, has pressured speech, older than stated age, weak appearing. ?HEENT:Oral mucosa moist, Ear/Nose WNL grossly, dentition normal. ?Respiratory system: bilaterally diminished,no use of accessory muscle ?Cardiovascular system: S1 & S2 +, No JVD,. ?Gastrointestinal system: Abdomen soft,NT,ND, BS+ ?Nervous System:Alert, awake, moving extremities and grossly nonfocal ?Extremities: edema neg,distal peripheral pulses palpable.  ?Skin: No rashes,no icterus. ?MSK: Right foot dorsum mild swelling, right foot lateral area with area of swelling tenderness  ? ?Medications reviewed:  ?Scheduled Meds: ? ? ?Continuous Infusions: ? ? ? ?  ?Diet Order   ? ? None  ? ?  ?  ? ?  ?  ?  ? ? ?Intake/Output Summary (Last 24 hours) at 03/04/2022 0729 ?Last data filed at 03/03/2022 1145 ?Gross per 24 hour  ?Intake 883.71 ml  ?Output --  ?Net 883.71 ml  ? ?Net IO Since Admission: 9,673.39 mL [03/04/22 0729]  ?Wt Readings from Last 3 Encounters:  ?03/03/22 82 kg  ?05/24/21 79.4 kg  ?09/30/18 72.6 kg  ?  ? ?Unresulted Labs (From admission, onward)  ? ? None  ? ?  ?Data Reviewed: I have personally reviewed following labs and imaging studies ?CBC: ?Recent Labs  ?Lab 03-17-22 ?0810  Mar 17, 2022 ?0842 03/01/22 ?0130 03/02/22 ?03/04/22 03/03/22 ?0324  ?WBC 15.8*  --  16.0* 11.1* 9.3  ?NEUTROABS 11.7*  --  12.1*  --   --   ?HGB 9.8* 10.5* 9.5* 8.7* 8.6*  ?HCT 30.1* 31.0* 28.7* 27.3* 26.9*  ?MCV 69.8*  --  69.7* 71.5* 71.2*  ?PLT 309  --  326 329 354  ? ?Basic Metabolic Panel: ?Recent Labs  ?Lab 03/17/22 ?0810 03/17/2022 ?0842 03/01/22 ?0130 03/02/22 ?03/04/22  ?NA 137 137 139 141  ?K 3.7 3.8 3.4* 3.9  ?CL 105  --  109 112*  ?CO2 22  --  26 24  ?GLUCOSE 112*  --  109* 100*  ?BUN 6  --  7 9  ?CREATININE 0.49  --  0.48 0.43*  ?CALCIUM 8.7*  --  8.3* 8.3*  ? ?  GFR: ?Estimated Creatinine Clearance: 101.8 mL/min (A) (by C-G formula based on SCr of 0.43 mg/dL (L)). ?Liver Function Tests: ?Recent Labs  ?Lab 02/27/22 ?0810 03/01/22 ?0130 03/02/22 ?[id number]  ?AST 18 14* 14*  ?ALT 12 11 10   ?ALKPHOS 64 65 54  ?BILITOT 0.7 0.5 0.3  ?PROT 7.0 6.6 6.5  ?ALBUMIN 3.7 3.1* 2.8*  ? ?No results for input(s): LIPASE, AMYLASE in the last 168 hours. ?Recent Labs  ?Lab 02/27/22 ?0810 03/01/22 ?0251  ?AMMONIA 44* 44*  ? ?Coagulation Profile: ?Recent Labs  ?Lab 03/01/22 ?0130  ?INR 1.0  ? ?BNP (last 3 results) ?No results for input(s): PROBNP in the last 8760 hours. ?HbA1C: ?No results for input(s): HGBA1C in the last 72 hours. ?CBG: ?Recent Labs  ?Lab 03/01/22 ?0320  ?GLUCAP 119*  ? ?Lipid Profile: ?No results for input(s): CHOL, HDL, LDLCALC, TRIG, CHOLHDL, LDLDIRECT in the last 72 hours. ?Thyroid Function Tests: ?No results for input(s): TSH, T4TOTAL, FREET4, T3FREE, THYROIDAB in the last 72 hours. ?Sepsis Labs: ?Recent Labs  ?Lab 02/27/22 ?0810 03/01/22 ?0300  ?LATICACIDVEN 1.2 0.8  ? ? ?Recent Results (from the past 240 hour(s))  ?Urine Culture     Status: Abnormal  ? Collection Time: 02/27/22 10:30 AM  ? Specimen: In/Out Cath Urine  ?Result Value Ref Range Status  ? Specimen Description IN/OUT CATH URINE  Final  ? Special Requests   Final  ?  NONE ?Performed at [facility], [address] [place], [place] [id number] ?  ?  Culture MULTIPLE SPECIES PRESENT, SUGGEST RECOLLECTION (A)  Final  ? Report Status 02/28/2022 FINAL  Final  ?Blood Culture (routine x 2)     Status: None (Preliminary result)  ? Collection Time: 03/01/22  2:51 AM

## 2022-03-06 LAB — GC/CHLAMYDIA PROBE AMP (~~LOC~~) NOT AT ARMC
Chlamydia: NEGATIVE
Comment: NEGATIVE
Comment: NORMAL
Neisseria Gonorrhea: NEGATIVE

## 2022-03-06 LAB — CULTURE, BLOOD (ROUTINE X 2)
Culture: NO GROWTH
Culture: NO GROWTH
Special Requests: ADEQUATE
Special Requests: ADEQUATE

## 2022-03-08 LAB — AEROBIC/ANAEROBIC CULTURE W GRAM STAIN (SURGICAL/DEEP WOUND): Gram Stain: NONE SEEN

## 2022-03-22 ENCOUNTER — Encounter (HOSPITAL_COMMUNITY): Payer: Self-pay

## 2022-03-22 ENCOUNTER — Other Ambulatory Visit: Payer: Self-pay

## 2022-03-22 ENCOUNTER — Inpatient Hospital Stay (HOSPITAL_COMMUNITY)
Admission: EM | Admit: 2022-03-22 | Discharge: 2022-03-24 | DRG: 602 | Disposition: A | Discharge status: Home / Self Care | Payer: Medicaid Other | Attending: Internal Medicine | Admitting: Internal Medicine

## 2022-03-22 ENCOUNTER — Emergency Department (HOSPITAL_COMMUNITY): Payer: Medicaid Other

## 2022-03-22 DIAGNOSIS — F17210 Nicotine dependence, cigarettes, uncomplicated: Secondary | ICD-10-CM | POA: Diagnosis present

## 2022-03-22 DIAGNOSIS — F1410 Cocaine abuse, uncomplicated: Secondary | ICD-10-CM | POA: Diagnosis present

## 2022-03-22 DIAGNOSIS — Z91030 Bee allergy status: Secondary | ICD-10-CM | POA: Diagnosis not present

## 2022-03-22 DIAGNOSIS — L0390 Cellulitis, unspecified: Secondary | ICD-10-CM | POA: Diagnosis present

## 2022-03-22 DIAGNOSIS — Z8249 Family history of ischemic heart disease and other diseases of the circulatory system: Secondary | ICD-10-CM

## 2022-03-22 DIAGNOSIS — F319 Bipolar disorder, unspecified: Secondary | ICD-10-CM | POA: Diagnosis present

## 2022-03-22 DIAGNOSIS — Z8614 Personal history of Methicillin resistant Staphylococcus aureus infection: Secondary | ICD-10-CM | POA: Diagnosis not present

## 2022-03-22 DIAGNOSIS — Z803 Family history of malignant neoplasm of breast: Secondary | ICD-10-CM | POA: Diagnosis not present

## 2022-03-22 DIAGNOSIS — Z5901 Sheltered homelessness: Secondary | ICD-10-CM | POA: Diagnosis not present

## 2022-03-22 DIAGNOSIS — G928 Other toxic encephalopathy: Secondary | ICD-10-CM | POA: Diagnosis present

## 2022-03-22 DIAGNOSIS — B9562 Methicillin resistant Staphylococcus aureus infection as the cause of diseases classified elsewhere: Secondary | ICD-10-CM | POA: Diagnosis present

## 2022-03-22 DIAGNOSIS — Z886 Allergy status to analgesic agent status: Secondary | ICD-10-CM

## 2022-03-22 DIAGNOSIS — F419 Anxiety disorder, unspecified: Secondary | ICD-10-CM | POA: Diagnosis present

## 2022-03-22 DIAGNOSIS — Z881 Allergy status to other antibiotic agents status: Secondary | ICD-10-CM

## 2022-03-22 DIAGNOSIS — D509 Iron deficiency anemia, unspecified: Secondary | ICD-10-CM | POA: Diagnosis present

## 2022-03-22 DIAGNOSIS — N899 Noninflammatory disorder of vagina, unspecified: Secondary | ICD-10-CM | POA: Diagnosis present

## 2022-03-22 DIAGNOSIS — G4730 Sleep apnea, unspecified: Secondary | ICD-10-CM | POA: Diagnosis present

## 2022-03-22 DIAGNOSIS — R300 Dysuria: Secondary | ICD-10-CM | POA: Diagnosis present

## 2022-03-22 DIAGNOSIS — G9340 Encephalopathy, unspecified: Secondary | ICD-10-CM | POA: Diagnosis not present

## 2022-03-22 DIAGNOSIS — Z888 Allergy status to other drugs, medicaments and biological substances status: Secondary | ICD-10-CM | POA: Diagnosis not present

## 2022-03-22 DIAGNOSIS — Z882 Allergy status to sulfonamides status: Secondary | ICD-10-CM | POA: Diagnosis not present

## 2022-03-22 DIAGNOSIS — L02611 Cutaneous abscess of right foot: Secondary | ICD-10-CM | POA: Diagnosis not present

## 2022-03-22 DIAGNOSIS — L03115 Cellulitis of right lower limb: Secondary | ICD-10-CM | POA: Diagnosis present

## 2022-03-22 LAB — URINALYSIS, ROUTINE W REFLEX MICROSCOPIC
Bilirubin Urine: NEGATIVE
Glucose, UA: NEGATIVE mg/dL
Hgb urine dipstick: NEGATIVE
Ketones, ur: NEGATIVE mg/dL
Nitrite: NEGATIVE
Protein, ur: NEGATIVE mg/dL
Specific Gravity, Urine: 1.013 (ref 1.005–1.030)
pH: 5 (ref 5.0–8.0)

## 2022-03-22 LAB — CBC WITH DIFFERENTIAL/PLATELET
Abs Immature Granulocytes: 0.02 10*3/uL (ref 0.00–0.07)
Basophils Absolute: 0.1 10*3/uL (ref 0.0–0.1)
Basophils Relative: 1 %
Eosinophils Absolute: 0.7 10*3/uL — ABNORMAL HIGH (ref 0.0–0.5)
Eosinophils Relative: 8 %
HCT: 34.1 % — ABNORMAL LOW (ref 36.0–46.0)
Hemoglobin: 11.3 g/dL — ABNORMAL LOW (ref 12.0–15.0)
Immature Granulocytes: 0 %
Lymphocytes Relative: 34 %
Lymphs Abs: 2.7 10*3/uL (ref 0.7–4.0)
MCH: 23.2 pg — ABNORMAL LOW (ref 26.0–34.0)
MCHC: 33.1 g/dL (ref 30.0–36.0)
MCV: 69.9 fL — ABNORMAL LOW (ref 80.0–100.0)
Monocytes Absolute: 1.1 10*3/uL — ABNORMAL HIGH (ref 0.1–1.0)
Monocytes Relative: 13 %
Neutro Abs: 3.6 10*3/uL (ref 1.7–7.7)
Neutrophils Relative %: 44 %
Platelets: 378 10*3/uL (ref 150–400)
RBC: 4.88 MIL/uL (ref 3.87–5.11)
RDW: 17.3 % — ABNORMAL HIGH (ref 11.5–15.5)
WBC: 8.1 10*3/uL (ref 4.0–10.5)
nRBC: 0 % (ref 0.0–0.2)

## 2022-03-22 LAB — COMPREHENSIVE METABOLIC PANEL
ALT: 12 U/L (ref 0–44)
AST: 20 U/L (ref 15–41)
Albumin: 3.8 g/dL (ref 3.5–5.0)
Alkaline Phosphatase: 58 U/L (ref 38–126)
Anion gap: 7 (ref 5–15)
BUN: 12 mg/dL (ref 6–20)
CO2: 23 mmol/L (ref 22–32)
Calcium: 8.7 mg/dL — ABNORMAL LOW (ref 8.9–10.3)
Chloride: 108 mmol/L (ref 98–111)
Creatinine, Ser: 0.53 mg/dL (ref 0.44–1.00)
GFR, Estimated: >60 mL/min (ref >60)
Glucose, Bld: 96 mg/dL (ref 70–99)
Potassium: 3.9 mmol/L (ref 3.5–5.1)
Sodium: 138 mmol/L (ref 135–145)
Total Bilirubin: 0.6 mg/dL (ref 0.3–1.2)
Total Protein: 7.5 g/dL (ref 6.5–8.1)

## 2022-03-22 LAB — LACTIC ACID, PLASMA: Lactic Acid, Venous: 0.6 mmol/L (ref 0.5–1.9)

## 2022-03-22 LAB — RAPID URINE DRUG SCREEN, HOSP PERFORMED
Amphetamines: NOT DETECTED
Barbiturates: NOT DETECTED
Benzodiazepines: NOT DETECTED
Cocaine: POSITIVE — AB
Opiates: NOT DETECTED
Tetrahydrocannabinol: NOT DETECTED

## 2022-03-22 LAB — MRSA NEXT GEN BY PCR, NASAL: MRSA by PCR Next Gen: DETECTED — AB

## 2022-03-22 LAB — HCG, QUANTITATIVE, PREGNANCY: hCG, Beta Chain, Quant, S: <1 m[IU]/mL (ref <5)

## 2022-03-22 MED ORDER — HYDROCODONE-ACETAMINOPHEN 5-325 MG PO TABS
1.0000 | ORAL_TABLET | ORAL | Status: DC | PRN
  Administered 2022-03-22: 2 via ORAL
  Filled 2022-03-22: qty 2

## 2022-03-22 MED ORDER — ACETAMINOPHEN 325 MG PO TABS
650.0000 mg | ORAL_TABLET | Freq: Four times a day (QID) | ORAL | Status: DC | PRN
  Administered 2022-03-23 – 2022-03-24 (×2): 650 mg via ORAL
  Filled 2022-03-22 (×2): qty 2

## 2022-03-22 MED ORDER — ONDANSETRON HCL 4 MG PO TABS: 4.0000 mg | ORAL_TABLET | Freq: Four times a day (QID) | ORAL | Status: DC | PRN

## 2022-03-22 MED ORDER — MUPIROCIN 2 % EX OINT
TOPICAL_OINTMENT | Freq: Two times a day (BID) | CUTANEOUS | Status: DC
  Administered 2022-03-23: 1 via TOPICAL
  Filled 2022-03-22 (×2): qty 22

## 2022-03-22 MED ORDER — SODIUM CHLORIDE 0.9 % IV SOLN: INTRAVENOUS | Status: DC

## 2022-03-22 MED ORDER — VANCOMYCIN HCL 750 MG/150ML IV SOLN
750.0000 mg | Freq: Three times a day (TID) | INTRAVENOUS | Status: DC
  Administered 2022-03-22 – 2022-03-24 (×7): 750 mg via INTRAVENOUS
  Filled 2022-03-22 (×8): qty 150

## 2022-03-22 MED ORDER — KETOROLAC TROMETHAMINE 15 MG/ML IJ SOLN
15.0000 mg | Freq: Four times a day (QID) | INTRAMUSCULAR | Status: DC | PRN
  Administered 2022-03-22 (×2): 15 mg via INTRAVENOUS
  Filled 2022-03-22: qty 1

## 2022-03-22 MED ORDER — ENOXAPARIN SODIUM 40 MG/0.4ML IJ SOSY
40.0000 mg | PREFILLED_SYRINGE | INTRAMUSCULAR | Status: DC
  Administered 2022-03-22 – 2022-03-24 (×3): 40 mg via SUBCUTANEOUS
  Filled 2022-03-22 (×3): qty 0.4

## 2022-03-22 MED ORDER — KETOROLAC TROMETHAMINE 15 MG/ML IJ SOLN
15.0000 mg | Freq: Once | INTRAMUSCULAR | Status: AC
  Administered 2022-03-22: 15 mg via INTRAVENOUS
  Filled 2022-03-22: qty 1

## 2022-03-22 MED ORDER — ONDANSETRON HCL 4 MG/2ML IJ SOLN
4.0000 mg | Freq: Four times a day (QID) | INTRAMUSCULAR | Status: DC | PRN
Start: 2022-03-22 — End: 2022-03-24

## 2022-03-22 MED ORDER — VANCOMYCIN HCL 1750 MG/350ML IV SOLN: 1750.0000 mg | Freq: Two times a day (BID) | INTRAVENOUS | Status: DC

## 2022-03-22 MED ORDER — ACETAMINOPHEN 650 MG RE SUPP: 650.0000 mg | Freq: Four times a day (QID) | RECTAL | Status: DC | PRN

## 2022-03-22 MED ORDER — VANCOMYCIN HCL 1750 MG/350ML IV SOLN
1750.0000 mg | Freq: Once | INTRAVENOUS | Status: AC
  Administered 2022-03-22: 1750 mg via INTRAVENOUS
  Filled 2022-03-22: qty 350

## 2022-03-22 MED ORDER — KETOROLAC TROMETHAMINE 15 MG/ML IJ SOLN
15.0000 mg | Freq: Once | INTRAMUSCULAR | Status: DC | PRN
  Filled 2022-03-22: qty 1

## 2022-03-22 NOTE — Progress Notes (Signed)
A consult was received from an ED physician for Vancomycin per pharmacy dosing.  The patient's profile has been reviewed for ht/wt/allergies/indication/available labs.   ?A one time order has been placed for Vancomycin 1750mg  IV.  Further antibiotics/pharmacy consults should be ordered by admitting physician if indicated.       ?                ?Thank you, ?Netta Cedars PharmD ?03/22/2022  5:11 AM  ?

## 2022-03-22 NOTE — ED Triage Notes (Addendum)
Pt BIB EMS. Pt reports with a right lateral foot wound x 1 month ago. Pt has pain in both legs and states that she may have a blood clot. Pt also has burning with urination.  ?

## 2022-03-22 NOTE — Plan of Care (Signed)

## 2022-03-22 NOTE — Progress Notes (Signed)
PHARMACY NOTE:  ANTIMICROBIAL RENAL DOSAGE ADJUSTMENT ? ?Current antimicrobial regimen includes a mismatch between antimicrobial dosage and estimated renal function.  As per policy approved by the Pharmacy & Therapeutics and Medical Executive Committees, the antimicrobial dosage will be adjusted accordingly. ? ?Current antimicrobial dosage:  Vancomycin 1750mg  IV q24h ? ?Indication: wound infection ? ?Renal Function: ? ?Estimated Creatinine Clearance: 101.5 mL/min (by C-G formula based on SCr of 0.53 mg/dL). ?[]      On intermittent HD, scheduled: ?[]      On CRRT ?   ?Antimicrobial dosage has been changed to:  Vancomycin 1gm IV q12h ? ?Additional comments: ? ? ?Thank you for allowing pharmacy to be a part of this patient's care. ? ? , [facility] ?03/22/2022 6:39 AM ? ? ?   ?

## 2022-03-22 NOTE — ED Notes (Signed)
Pt refused to have vital signs taken.

## 2022-03-22 NOTE — ED Notes (Signed)
Pt pulled IV out and this nurse tried to place another one and she jerked away pulling that one out, provider made aware ?
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
## 2022-03-22 NOTE — Progress Notes (Signed)
Pharmacy Antibiotic Note ? ?Leah Barry is a 36 y.o. female admitted on 03/22/2022 with  wound infection .  Pharmacy has been consulted for Vanco dosing. ? ? ?ID: Foot ulcer recently growing MRSA. Left AMA ? ?4/19: Foot wound: MRSA ?4/21: Abscess:  MRSA ? ?5 10: Vanco>> ? ?Plan: ?Vanc 1750mg  IV x1  ?Vancomycin 750 mg IV Q 8 hrs. Goal AUC 400-550. ?Expected AUC: 501 ?SCr used: 0.8 ? ? ? ? ?Height: 5\' 5"  (165.1 cm) ?Weight: 81.6 kg (180 lb) ?IBW/kg (Calculated) : 57 ? ?Temp (24hrs), Avg:97.9 ?F (36.6 ?C), Min:97.5 ?F (36.4 ?C), Max:98.3 ?F (36.8 ?C) ? ?Recent Labs  ?Lab 03/22/22 ?0230  ?WBC 8.1  ?CREATININE 0.53  ?LATICACIDVEN 0.6  ?  ?Estimated Creatinine Clearance: 101.5 mL/min (by C-G formula based on SCr of 0.53 mg/dL).   ? ?Allergies  ?Allergen Reactions  ? Latuda [Lurasidone Hcl] Palpitations and Other (See Comments)  ?  Hot flashes, tremors  ? Sulfa Antibiotics Swelling  ?  SWELLING OF EYES  ? Bee Venom Swelling  ?  "Bees make my eyes swell."  ? Flagyl [Metronidazole] Nausea And Vomiting  ?  tablets causes reaction ?Topical is ok  ? Naproxen Other (See Comments)  ?  NOSE BLEED  ? Tramadol Itching and Nausea And Vomiting  ? ? ?Nalany Steedley S. Alford Highland, PharmD, BCPS ?Clinical Staff Pharmacist [email or web address] ? ?Alford Highland, [employer] ?03/22/2022 8:49 AM ? ?

## 2022-03-22 NOTE — Consult Note (Signed)
WOC Nurse Consult Note: ?Reason for Consult:Cellulitis to right foot with wound to right plantar and lateral foot at fifth metatarsal.  Seen 2 weeks ago for the wound and left AMA.  Only one X ray film was obtained due to patient cooperation but showed no osteomyelitis.   ?Wound type: nonhealing, infectious ?Pressure Injury POA: NA ?Measurement: 2 cm x 1.5 cm wound bed is ruddy red and scattered darkened devitalized tissue.  Noted allergy to bee venom.  Due to this, will not begin menuka honey for debridement due to small but present risk of reaction.  Due to MRSA in wound, will implement mupirocin ointment twice daily.  Is on vancomycin at this time.  ?Wound bed:See above  ?Drainage (amount, consistency, odor) minimal serosanguinous  no odor ?Periwound: darkened discoloration to periwound skin, tender to touch and edematous ?Dressing procedure/placement/frequency: Cleanse wound to right lateral/plantar foot with NS.  Apply mupirocin ointment to wound bed. Cover with foam dressing.  Apply twice daily.  Peel back foam and re-apply.  Change foam every three days and PRN soilage.   ?Will not follow at this time.  Please re-consult if needed.  ?Maple Hudson MSN, RN, FNP-BC CWON ?Wound, Ostomy, Continence Nurse ?Pager [phone]  ? ? ? ?  ?

## 2022-03-22 NOTE — Progress Notes (Signed)
Transition of Care (TOC) Screening Note ? ?Patient Details  ?Name: Leah Barry ?Date of Birth: 05/12/1986 ? ?Transition of Care (TOC) CM/SW Contact:    ?Ewing Schlein, LCSW ?Phone Number: ?03/22/2022, 9:38 AM ? ?Transition of Care Department [facility]) has reviewed patient and no TOC needs have been identified at this time. We will continue to monitor patient advancement through interdisciplinary progression rounds. If new patient transition needs arise, please place a TOC consult. ?

## 2022-03-22 NOTE — ED Provider Notes (Signed)
?[facility]-EMERGENCY DEPT ?Provider Note ? ? ?CSN: [id number] ?Arrival date & time: 03/22/22  0124 ? ?  ? ?History ? ?Chief Complaint  ?Patient presents with  ? foot wound  ?Level 5 caveat due to altered mental status ? ?Leah Barry is a 36 y.o. female. ? ?The history is provided by the patient.  ?Patient presents with right foot pain.  She reports she had a wound on her right foot for several weeks.  It is not improving.  She also mention to nursing that she has dysuria.  She also told nursing she may have a blood clot. ? ?On my  evaluation patient is somnolent, and mumbles throughout the exam reporting foot pain. ?  ? ?Home Medications ?Prior to Admission medications   ?Medication Sig Start Date End Date Taking? Authorizing Provider  ?naloxone (NARCAN) nasal spray 4 mg/0.1 mL [place] for opiod overdose ?Patient not taking: Reported on 03/01/2022 05/24/21   Virgina Norfolk, DO  ?   ? ?Allergies    ?Latuda [lurasidone hcl], Sulfa antibiotics, Bee venom, Flagyl [metronidazole], Naproxen, and Tramadol   ? ?Review of Systems   ?Review of Systems  ?Unable to perform ROS: Mental status change  ? ?Physical Exam ?Updated Vital Signs ?BP 122/72   Pulse 78   Temp (!) 97.5 ?F (36.4 ?C) (Oral)   Resp 16   Ht 1.651 m (5\' 5" )   Wt 81.6 kg   SpO2 97%   BMI 29.95 kg/m?  ?Physical Exam ?CONSTITUTIONAL: Disheveled, somnolent ?HEAD: Normocephalic/atraumatic ?ENMT: Mucous membranes moist ?NECK: supple no meningeal signs ?CV: S1/S2 noted, no murmurs/rubs/gallops noted ?LUNGS: Lungs are clear to auscultation bilaterally, no apparent distress ?ABDOMEN: soft, nontender ?NEURO: Pt is somnolent but easily arousable.  She moves all extremities x4. ?EXTREMITIES: pulses normal/equal, full ROM, no significant lower extremity edema or calf tenderness ?Distal pulses equal and intact to both feet.  She has a large wound on the right foot.  No crepitus.  Both feet are tender to palpation. ?SKIN: See  photo ? ? ? ? ? ? ?ED Results / Procedures / Treatments   ?Labs ?(all labs ordered are listed, but only abnormal results are displayed) ?Labs Reviewed  ?COMPREHENSIVE METABOLIC PANEL - Abnormal; Notable for the following components:  ?    Result Value  ? Calcium 8.7 (*)   ? All other components within normal limits  ?CBC WITH DIFFERENTIAL/PLATELET - Abnormal; Notable for the following components:  ? Hemoglobin 11.3 (*)   ? HCT 34.1 (*)   ? MCV 69.9 (*)   ? MCH 23.2 (*)   ? RDW 17.3 (*)   ? Monocytes Absolute 1.1 (*)   ? Eosinophils Absolute 0.7 (*)   ? All other components within normal limits  ?URINALYSIS, ROUTINE W REFLEX MICROSCOPIC - Abnormal; Notable for the following components:  ? Leukocytes,Ua SMALL (*)   ? Bacteria, UA RARE (*)   ? All other components within normal limits  ?CULTURE, BLOOD (ROUTINE X 2)  ?LACTIC ACID, PLASMA  ?HCG, QUANTITATIVE, PREGNANCY  ? ? ?EKG ?None ? ?Radiology ?DG Foot 2 Views Left ? ?Result Date: 03/22/2022 ?CLINICAL DATA:  Foot pain, sores along lateral side and plantar surface of foot. EXAM: LEFT FOOT - 2 VIEW COMPARISON:  None Available. FINDINGS: Limited study. Patient refused additional imaging after 1 image was taken. No visible acute bony abnormality. No fracture, subluxation or dislocation. No evidence for osteomyelitis. IMPRESSION: Limited study.  No acute bony abnormality. Electronically Signed   By: 05/22/2022  Dover M.D.   On: 03/22/2022 03:21  ? ?DG Foot Complete Right ? ?Result Date: 03/22/2022 ?CLINICAL DATA:  Pain over the lateral aspect of the right foot. EXAM: RIGHT FOOT COMPLETE - 3+ VIEW COMPARISON:  Right ankle radiograph dated 02/27/2022. FINDINGS: There is no acute fracture or dislocation. The bones are well mineralized. No arthritic changes. No periosteal elevation or bone erosion to suggest acute osteomyelitis. There is soft tissue swelling and skin ulceration of the plantar aspect over the base of the fifth metatarsal. No radiopaque foreign object or soft tissue  gas. IMPRESSION: 1. No acute fracture or dislocation. No radiographic evidence of acute osteomyelitis. 2. Ulceration of the skin over the plantar base of the fifth metatarsal. Electronically Signed   By: Elgie CollardArash  Radparvar M.D.   On: 03/22/2022 03:04   ? ?Procedures ?Procedures  ? ? ?Medications Ordered in ED ?Medications  ?ketorolac (TORADOL) 15 MG/ML injection 15 mg (has no administration in time range)  ?enoxaparin (LOVENOX) injection 40 mg (has no administration in time range)  ? ? ?ED Course/ Medical Decision Making/ A&P ?Clinical Course as of 03/22/22 0412  ?Wed Mar 22, 2022  ?0214 Patient presents with foot pain.  Patient has an obvious wound to her right foot, she has been seen for this previously.  She has been admitted previously.  History is limited as patient is very somnolent throughout the exam which is consistent with previous admissions.  Labs and imaging are pending at this time [DW]  ?0357 Labs are overall reassuring.  However patient has large wound to the right foot that has not been healing and it appears that she has not been keeping it clean. suspicion that she has superimposed cellulitis.  Patient agrees to be admitted [DW]  ?[id number] Discussed with Dr. Margo AyeHall for admission.  Patient be admitted for wound care and IV antibiotics. [DW]  ?  ?Clinical Course User Index ?[DW] Zadie RhineWickline, Anely Spiewak, MD  ? ?                        ?Medical Decision Making ?Amount and/or Complexity of Data Reviewed ?Labs: ordered. ?Radiology: ordered. ? ?Risk ?Prescription drug management. ?Decision regarding hospitalization. ? ? ?This patient presents to the ED for concern of foot pain, this involves an extensive number of treatment options, and is a complaint that carries with it a high risk of complications and morbidity.  The differential diagnosis includes but is not limited to abscess, cellulitis, osteomyelitis ? ?Comorbidities that complicate the patient evaluation: ?Patient?s presentation is complicated by their history  of previous abscess and substance use disorder ? ?Social Determinants of Health: ?Patient?s lack of prescription access  increases the complexity of managing their presentation ? ?Additional history obtained: ?Records reviewed previous admission documents ? ?Lab Tests: ?I Ordered, and personally interpreted labs.  The pertinent results include: No acute findings on labs ? ?Imaging Studies ordered: ?I ordered imaging studies including X-ray foot   ?I independently visualized and interpreted imaging which showed no acute fractures ?I agree with the radiologist interpretation ? ?Medicines ordered and prescription drug management: ?I ordered medication including Toradol for pain; vancomycin for presumed infection ? ? ?Consultations Obtained: ?I requested consultation with the admitting physician Dr. Margo AyeHall , and discussed  findings as well as pertinent plan - they recommend: Admission ? ?Reevaluation: ?After the interventions noted above, I reevaluated the patient and found that they have :stayed the same ? ?Complexity of problems addressed: ?Patient?s presentation is most consistent with  acute  presentation with potential threat to life or bodily function ? ?Disposition: ?After consideration of the diagnostic results and the patient?s response to treatment,  ?I feel that the patent would benefit from admission   .  ? ? ? ? ? ? ? ? ?Final Clinical Impression(s) / ED Diagnoses ?Final diagnoses:  ?Cellulitis of right foot  ?Abscess of right foot  ? ? ?Rx / DC Orders ?ED Discharge Orders   ? ? None  ? ?  ? ? ?  ?Zadie Rhine, MD ?03/22/22 0413 ? ?

## 2022-03-23 DIAGNOSIS — B9562 Methicillin resistant Staphylococcus aureus infection as the cause of diseases classified elsewhere: Secondary | ICD-10-CM

## 2022-03-23 DIAGNOSIS — F1410 Cocaine abuse, uncomplicated: Secondary | ICD-10-CM

## 2022-03-23 DIAGNOSIS — Z8614 Personal history of Methicillin resistant Staphylococcus aureus infection: Secondary | ICD-10-CM

## 2022-03-23 DIAGNOSIS — G9340 Encephalopathy, unspecified: Secondary | ICD-10-CM

## 2022-03-23 DIAGNOSIS — D509 Iron deficiency anemia, unspecified: Secondary | ICD-10-CM

## 2022-03-23 DIAGNOSIS — L02611 Cutaneous abscess of right foot: Secondary | ICD-10-CM

## 2022-03-23 LAB — COMPREHENSIVE METABOLIC PANEL
ALT: 10 U/L (ref 0–44)
AST: 12 U/L — ABNORMAL LOW (ref 15–41)
Albumin: 3 g/dL — ABNORMAL LOW (ref 3.5–5.0)
Alkaline Phosphatase: 49 U/L (ref 38–126)
Anion gap: 4 — ABNORMAL LOW (ref 5–15)
BUN: 17 mg/dL (ref 6–20)
CO2: 23 mmol/L (ref 22–32)
Calcium: 8.4 mg/dL — ABNORMAL LOW (ref 8.9–10.3)
Chloride: 113 mmol/L — ABNORMAL HIGH (ref 98–111)
Creatinine, Ser: 0.53 mg/dL (ref 0.44–1.00)
GFR, Estimated: >60 mL/min (ref >60)
Glucose, Bld: 119 mg/dL — ABNORMAL HIGH (ref 70–99)
Potassium: 3.7 mmol/L (ref 3.5–5.1)
Sodium: 140 mmol/L (ref 135–145)
Total Bilirubin: 0.4 mg/dL (ref 0.3–1.2)
Total Protein: 6.2 g/dL — ABNORMAL LOW (ref 6.5–8.1)

## 2022-03-23 LAB — CBC
HCT: 31.5 % — ABNORMAL LOW (ref 36.0–46.0)
Hemoglobin: 10 g/dL — ABNORMAL LOW (ref 12.0–15.0)
MCH: 22.4 pg — ABNORMAL LOW (ref 26.0–34.0)
MCHC: 31.7 g/dL (ref 30.0–36.0)
MCV: 70.6 fL — ABNORMAL LOW (ref 80.0–100.0)
Platelets: 339 10*3/uL (ref 150–400)
RBC: 4.46 MIL/uL (ref 3.87–5.11)
RDW: 17.4 % — ABNORMAL HIGH (ref 11.5–15.5)
WBC: 7.6 10*3/uL (ref 4.0–10.5)
nRBC: 0 % (ref 0.0–0.2)

## 2022-03-23 LAB — HIV ANTIBODY (ROUTINE TESTING W REFLEX): HIV Screen 4th Generation wRfx: NONREACTIVE

## 2022-03-23 LAB — RAPID HIV SCREEN (HIV 1/2 AB+AG)
HIV 1/2 Antibodies: NONREACTIVE
HIV-1 P24 Antigen - HIV24: NONREACTIVE

## 2022-03-23 LAB — WET PREP, GENITAL
Clue Cells Wet Prep HPF POC: NONE SEEN
Sperm: NONE SEEN
Trich, Wet Prep: NONE SEEN
WBC, Wet Prep HPF POC: <10 (ref <10)
Yeast Wet Prep HPF POC: NONE SEEN

## 2022-03-23 MED ORDER — SODIUM CHLORIDE 0.9 % IV SOLN
250.0000 mg | Freq: Once | INTRAVENOUS | Status: AC
  Administered 2022-03-23: 250 mg via INTRAVENOUS
  Filled 2022-03-23: qty 20

## 2022-03-23 MED ORDER — LORAZEPAM 0.5 MG PO TABS
0.2500 mg | ORAL_TABLET | Freq: Once | ORAL | Status: AC
  Administered 2022-03-23: 0.25 mg via ORAL
  Filled 2022-03-23: qty 1

## 2022-03-23 MED ORDER — MORPHINE SULFATE (PF) 2 MG/ML IV SOLN
2.0000 mg | INTRAVENOUS | Status: DC | PRN
  Administered 2022-03-23: 2 mg via INTRAVENOUS
  Filled 2022-03-23: qty 1

## 2022-03-23 MED ORDER — DIPHENHYDRAMINE HCL 50 MG/ML IJ SOLN
25.0000 mg | Freq: Once | INTRAMUSCULAR | Status: AC
  Administered 2022-03-23: 25 mg via INTRAVENOUS
  Filled 2022-03-23: qty 1

## 2022-03-23 MED ORDER — OXYCODONE HCL 5 MG PO TABS
5.0000 mg | ORAL_TABLET | ORAL | Status: DC | PRN
Start: 2022-03-23 — End: 2022-03-24
  Administered 2022-03-23 – 2022-03-24 (×3): 5 mg via ORAL
  Filled 2022-03-23 (×3): qty 1

## 2022-03-23 MED ORDER — KETOROLAC TROMETHAMINE 15 MG/ML IJ SOLN
15.0000 mg | Freq: Four times a day (QID) | INTRAMUSCULAR | Status: DC
Start: 2022-03-23 — End: 2022-03-24
  Administered 2022-03-23 – 2022-03-24 (×5): 15 mg via INTRAVENOUS
  Filled 2022-03-23 (×5): qty 1

## 2022-03-23 MED ORDER — CYANOCOBALAMIN 1000 MCG/ML IJ SOLN
1000.0000 ug | Freq: Once | INTRAMUSCULAR | Status: AC
  Administered 2022-03-23: 1000 ug via SUBCUTANEOUS
  Filled 2022-03-23: qty 1

## 2022-03-23 MED ORDER — SODIUM CHLORIDE 0.9 % IV SOLN
INTRAVENOUS | Status: DC | PRN
Start: 2022-03-23 — End: 2022-03-24

## 2022-03-23 MED ORDER — ALPRAZOLAM 0.25 MG PO TABS
0.2500 mg | ORAL_TABLET | Freq: Once | ORAL | Status: AC
  Administered 2022-03-23: 0.25 mg via ORAL
  Filled 2022-03-23: qty 1

## 2022-03-23 NOTE — Progress Notes (Signed)
?[facility] Progress Note ? ?Patient: Leah Barry     ?XBM:[id number]  ?DOA: 03/22/2022   ?PCP: Pcp, No  ? ?  ?  ?Brief hospital course: ?This is a 36 year old female with anxiety, depression, sleep apnea, history of MRSA infection, PID who presents to the hospital for a right foot wound that has been present for a few weeks.  She was previously admitted to the hospital a couple of weeks ago, underwent I & D for an abscess, received antibiotics however left AMA prior to completion of antibiotics.  Culture at that time grew a moderate amount of MRSA. ?In the ED: Noted to have a normal WBC count of 8.1 ?Admitted for right foot wound with surrounding cellulitis and started on vancomycin ? ?Subjective:  ?Numerous complaints. Has pain in her right foot that is not being controlled with current pain meds. Has anxiety. Has a "smell" in her vaginal area and states she "needs" to be tested for STDs.  ?Refuses to go to a shelter after discharge from the hospital and prefers to be homeless. Agrees to not leave AMA this time.  ? ?Assessment and Plan: ?Principal Problem: ?  MRSA cellulitis of right foot ?-Vancomycin has been resumed ?-X-rays are negative for osteomyelitis ?-Pain medications have been adjusted today to better allow for pain relief ?  ?Active Problems: ?  Iron deficiency anemia.  Microcytosis ?-We will be giving an iron infusion today ?-Have ordered stool Hemoccult ? ?  Acute encephalopathy ?-Has resolved-etiology undetermined ? ?Low normal vitamin B12 ?-Vitamin B12 level is 282 ?-I will order a 7000 mcg ? ?? Of STD ?- last tested on 4/21 and was negative for GC/ Chlamydia- have resent the test ?- wet prep ordered today and is negative ?- HIV neg ? ?Homeless ?-The patient states that she does not want to go to a shelter and despite being given resources for shelter during her last admission, she did not go and has been sleeping on the floor of her house, ?  ?DVT prophylaxis:  Lovenox ?  Code Status:  Full Code  ?Consultants: none ?Level of Care: Level of care: Med-Surg ?Disposition Plan:  ?Status is: Inpatient ?Remains inpatient appropriate because: MRSA foot infection ? ?Objective: ?  ?Vitals:  ? 03/22/22 1359 03/22/22 1359 03/22/22 2214 03/23/22 0549  ?BP: 127/75 127/75 138/77 123/66  ?Pulse: 97 81 77 83  ?Resp: 18 18 17 17   ?Temp: 98.3 ?F (36.8 ?C) 98.3 ?F (36.8 ?C) 97.8 ?F (36.6 ?C) 97.7 ?F (36.5 ?C)  ?TempSrc: Oral Oral Oral Oral  ?SpO2: 100% 100% 100% 100%  ?Weight:      ?Height:      ? ?Filed Weights  ? 03/22/22 0133  ?Weight: 81.6 kg  ? ?Exam: ?General exam: Appears comfortable  ?HEENT: PERRLA, oral mucosa moist, no sclera icterus or thrush ?Respiratory system: Clear to auscultation. Respiratory effort normal. ?Cardiovascular system: S1 & S2 heard, regular rate and rhythm ?Gastrointestinal system: Abdomen soft, non-tender, nondistended. Normal bowel sounds   ?Central nervous system: Alert and oriented. No focal neurological deficits. ?Extremities: No cyanosis, clubbing or edema ?Skin: ulcer on outer aspect of right foot is present with significant surrounding edema ?Psychiatry:  anxious/ tearful ? ?Imaging and lab data was personally reviewed ? ? ? CBC: ?Recent Labs  ?Lab 03/22/22 ?0230 03/23/22 ?0336  ?WBC 8.1 7.6  ?NEUTROABS 3.6  --   ?HGB 11.3* 10.0*  ?HCT 34.1* 31.5*  ?MCV 69.9* 70.6*  ?PLT 378 339  ? ?Basic Metabolic Panel: ?Recent Labs  ?  Lab 03/22/22 ?0230 03/23/22 ?0336  ?NA 138 140  ?K 3.9 3.7  ?CL 108 113*  ?CO2 23 23  ?GLUCOSE 96 119*  ?BUN 12 17  ?CREATININE 0.53 0.53  ?CALCIUM 8.7* 8.4*  ? ?GFR: ?Estimated Creatinine Clearance: 101.5 mL/min (by C-G formula based on SCr of 0.53 mg/dL). ? ?Scheduled Meds: ? enoxaparin (LOVENOX) injection  40 mg Subcutaneous Q24H  ? mupirocin ointment   Topical BID  ? ?Continuous Infusions: ? sodium chloride 50 mL/hr at 03/23/22 0320  ? vancomycin 750 mg (03/23/22 0359)  ? ? ? LOS: 1 day  ? ?Author: ?Calvert Cantor  ?03/23/2022 8:19 AM ?   ?

## 2022-03-23 NOTE — TOC Progression Note (Signed)
Transition of Care (TOC) - Progression Note  ? ?Patient Details  ?Name: Leah Barry ?MRN: [id number] ?Date of Birth: 20-Apr-1986 ? ?Transition of Care (TOC) CM/SW Contact  ?Ewing Schlein, LCSW ?Phone Number: ?03/23/2022, 10:08 AM ? ?Clinical Narrative: Shelter resources and contact information for [employer] added to patient's AVS.   ? ?Barriers to Discharge: Continued Medical Work up ? ?Readmission Risk Interventions ?   ? View : No data to display.  ?  ?  ?  ? ?

## 2022-03-24 ENCOUNTER — Other Ambulatory Visit (HOSPITAL_COMMUNITY): Payer: Self-pay

## 2022-03-24 DIAGNOSIS — Z8614 Personal history of Methicillin resistant Staphylococcus aureus infection: Secondary | ICD-10-CM

## 2022-03-24 LAB — GC/CHLAMYDIA PROBE AMP (~~LOC~~) NOT AT ARMC
Chlamydia: NEGATIVE
Comment: NEGATIVE
Comment: NORMAL
Neisseria Gonorrhea: NEGATIVE

## 2022-03-24 MED ORDER — LINEZOLID 600 MG PO TABS
600.0000 mg | ORAL_TABLET | Freq: Two times a day (BID) | ORAL | 0 refills | Status: AC
  Filled 2022-03-24: qty 28, 14d supply, fill #0

## 2022-03-24 MED ORDER — LINEZOLID 600 MG PO TABS: 600.0000 mg | ORAL_TABLET | Freq: Two times a day (BID) | ORAL | 0 refills | Status: DC

## 2022-03-24 MED ORDER — VANCOMYCIN HCL 1250 MG/250ML IV SOLN
1250.0000 mg | Freq: Two times a day (BID) | INTRAVENOUS | Status: DC
  Filled 2022-03-24: qty 250

## 2022-03-24 MED ORDER — OXYCODONE HCL 5 MG PO TABS
5.0000 mg | ORAL_TABLET | ORAL | 0 refills | Status: AC | PRN
  Filled 2022-03-24: qty 20, 4d supply, fill #0

## 2022-03-24 MED ORDER — NALOXONE HCL 4 MG/0.1ML NA LIQD
NASAL | 1 refills | Status: AC
  Filled 2022-03-24: qty 2, 2d supply, fill #0

## 2022-03-24 MED ORDER — OXYCODONE HCL 5 MG PO TABS: 5.0000 mg | ORAL_TABLET | ORAL | 0 refills | Status: DC | PRN

## 2022-03-24 NOTE — Discharge Summary (Addendum)
Physician Discharge Summary  ?Leah Barry UKG:[id number] DOB: 11/02/1986 DOA: 03/22/2022 ? ?PCP: Pcp, No ? ?Admit date: 03/22/2022 ?Discharge date: 03/24/2022 ?Discharging to: friend's home (homeless otherwise) ?Recommendations for Outpatient Follow-up:  ?Please f/u on ulcer ?F/u on Iron levels and Vit B12 ? ?Consults:  ?none ?Procedures:  ?none ? ? ?Discharge Diagnoses:  ? Principal Problem: ?  MRSA cellulitis of right foot ?Active Problems: ?  Iron deficiency anemia ?  Acute encephalopathy ?  History of methicillin resistant staphylococcus aureus (MRSA) ?  Cocaine abuse ([place]) ?  Microcytic anemia ? ? ? ? ?Hospital Course: This is a 36 year old female with anxiety, depression, sleep apnea, PID who presents to the hospital for a right foot wound that has been present for a few weeks.  She was previously admitted to the hospital a couple of weeks ago, underwent I & D for an abscess, received antibiotics however left AMA prior to completion of antibiotics.  Culture at that time grew a moderate amount of MRSA. ?In the ED: Noted to have a normal WBC count of 8.1 ?Admitted for right foot wound with surrounding cellulitis and started on vancomycin ? ?Principal Problem: ?  Wound with MRSA cellulitis of right foot ?-Vancomycin resumed- discharge from wound is slowing now- no signs of recurrent abscess, cellulitis improved  ?- transition to Zyvox today and dc from hospital ?-X-rays are negative for osteomyelitis ?- extensive education given regarding keeping the wound clean, taking full course of antibiotics and taking them on time, not losing her medications. She is advised to keep the wound covered for 2 wks even after it begins to epithelialize.  ?- TOC will attempt to make her an appt with [facility] but I suspect she may be non-compliant with going ?  ?  ?Active Problems: ?  Iron deficiency anemia with microcytosis ?- Hgb 10-11 range ?- MCV 70 ?- Iron saturation 8 and Ferritin 10 ?- Given an  Iron infusion yesterday ?-Have ordered stool Hemoccult but it has not been completed ?  ?  Acute encephalopathy ?-Has resolved-etiology undetermined- ? Due to drug use ?  ?Low normal vitamin B12 ?-Vitamin B12 level is 282 ?-I will order a 7000 mcg ?  ?? 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 ?- no tenderness noted on abdominal exam ?- last tested on 4/21 and was negative for GC/ Chlamydia- have resent the test and it is negative again ?- wet prep ordered and is negative as well ?- HIV neg ?  ?Cocain abuse ?- have advised her strongly to avoid all illegal drugs ? ?Anxiety ?- during the course of our conversations, she appears to repeatedly state that she is "becoming anxious" and tends to avoid difficult conversations ? ?Homeless ?-The patient states that she does not want to go to a shelter and despite being given resources for shelter during her last admission, she did not go and has been sleeping on the floor of a motel. ? ?Discharge Instructions ? ?Discharge Instructions   ? ? Diet - low sodium heart healthy   Complete by: As directed ?  ? Discharge wound care:   Complete by: As directed ?  ? Cleanse wound to right lateral/plantar foot with NS.  Apply mupirocin ointment to wound bed. Cover with foam dressing.  Apply twice daily.  Peel back foam and re-apply.  Change foam every three days  and PRN soilage.  ? Increase activity slowly   Complete by: As directed ?  ? ?  ? ?Allergies as of 03/24/2022   ? ?   Reactions  ? Latuda [lurasidone Hcl] Palpitations, Other (See Comments)  ? Hot flashes, tremors  ? Sulfa Antibiotics Swelling  ? SWELLING OF EYES  ? Aleve [naproxen] Other (See Comments)  ? Nose bleeds  ? Bee Venom Swelling  ? "Bees make my eyes swell."  ? Flagyl [metronidazole] Nausea And Vomiting  ? tablets causes reaction ?Topical is ok  ? Ultram [tramadol] Itching, Nausea And Vomiting  ? ?  ? ?   ?Medication List  ?  ? ?TAKE these medications   ? ?linezolid 600 MG tablet ?Commonly known as: Zyvox ?Take 1 tablet (600 mg total) by mouth 2 (two) times daily for 14 days. ?  ?naloxone 4 MG/0.1ML Liqd nasal spray kit ?Commonly known as: NARCAN ?[place] for opiod overdose ?  ?oxyCODONE 5 MG immediate release tablet ?Commonly known as: Oxy IR/ROXICODONE ?Take 1 tablet (5 mg total) by mouth every 4 (four) hours as needed for up to 20 doses for moderate pain. ?  ? ?  ? ?  ?  ? ? ?  ?Discharge Care Instructions  ?(From admission, onward)  ?  ? ? ?  ? ?  Start     Ordered  ? 03/24/22 0000  Discharge wound care:       ?Comments: Cleanse wound to right lateral/plantar foot with NS.  Apply mupirocin ointment to wound bed. Cover with foam dressing.  Apply twice daily.  Peel back foam and re-apply.  Change foam every three days and PRN soilage.  ? 03/24/22 1119  ? ?  ?  ? ?  ? ? Follow-up Information   ? ? [employer]. Call.   ?Why: Call to apply to the housing waiting list. ?Contact information: ?[address] ?[place], [place] [address] ?[phone] ? ?  ?  ? ?  ?  ? ?  ? ?  ?  ?The results of significant diagnostics from this hospitalization (including imaging, microbiology, ancillary and laboratory) are listed below for reference.   ? ?CT HEAD WO CONTRAST ? ?Result Date: 03/01/2022 ?CLINICAL DATA:  Altered mental status EXAM: CT HEAD WITHOUT CONTRAST TECHNIQUE: Contiguous axial images were obtained from the base of the skull through the vertex without intravenous contrast. RADIATION DOSE REDUCTION: This exam was performed according to the departmental dose-optimization program which includes automated exposure control, adjustment of the mA and/or kV according to patient size and/or use of iterative reconstruction technique. COMPARISON:  02/27/2022 FINDINGS: Brain: There is no mass, hemorrhage or extra-axial collection. The size and configuration of the ventricles and extra-axial CSF spaces are  normal. The brain parenchyma is normal, without acute or chronic infarction. Vascular: No abnormal hyperdensity of the major intracranial arteries or dural venous sinuses. No intracranial atherosclerosis. Skull: The visualized skull base, calvarium and extracranial soft tissues are normal. Sinuses/Orbits: No fluid levels or advanced mucosal thickening of the visualized paranasal sinuses. No mastoid or middle ear effusion. The orbits are normal. IMPRESSION: Normal head CT. Electronically Signed   By: Ulyses Jarred M.D.   On: 03/01/2022 03:53  ? ?CT Head Wo Contrast ? ?Result Date: 02/27/2022 ?CLINICAL DATA:  altered; altered, foot injury EXAM: CT HEAD WITHOUT CONTRAST CT CERVICAL SPINE WITHOUT CONTRAST TECHNIQUE: Multidetector CT imaging of the head and cervical spine was performed following the standard protocol without intravenous contrast. Multiplanar CT image reconstructions of  the cervical spine were also generated. RADIATION DOSE REDUCTION: This exam was performed according to the departmental dose-optimization program which includes automated exposure control, adjustment of the mA and/or kV according to patient size and/or use of iterative reconstruction technique. COMPARISON:  CT head and CT cervical spine 05/24/2021. FINDINGS: CT HEAD FINDINGS Brain: No evidence of acute infarction, hemorrhage, hydrocephalus, extra-axial collection or mass lesion/mass effect. Vascular: No lower hyperdense vessel identified. Skull: No acute fracture. Sinuses/Orbits: Remote left medial orbital wall fracture. Clear visualized sinuses. No acute orbital findings. Other: No mastoid effusions. CT CERVICAL SPINE FINDINGS Alignment: No substantial sagittal subluxation. Mild broad levocurvature. Skull base and vertebrae: Vertebral body heights are maintained. No evidence of acute fracture. Soft tissues and spinal canal: No prevertebral fluid or swelling. No visible canal hematoma. Disc levels:  Mild degenerative change. Upper chest:  Visualized lung apices are clear. IMPRESSION: 1. No evidence of acute intracranial abnormality. 2. No evidence of acute fracture or traumatic malalignment in the cervical spine. Electronically Signed   By: Henreitta Cea

## 2022-03-24 NOTE — TOC Transition Note (Addendum)
Transition of Care (TOC) - CM/SW Discharge Note ? ?Patient Details  ?Name: Leah Barry ?MRN: [id number] ?Date of Birth: [birth year] ? ?Transition of Care (TOC) CM/SW Contact:  ?Sherie Don, LCSW ?Phone Number: ?03/24/2022, 1:27 PM ? ?Clinical Narrative: Patient will need a hospital follow up appointment as patient does not have a PCP. CSW scheduled hospital follow up appointment for Wednesday April 19, 2022 at 1:30pm. Patient will see Freeman Caldron. Patient updated and appointment added to AVS. TOC signing off. ? ?Addendum: CSW notified patient needs a ride home. Patient is ambulatory and eligible for a bus pass. Patient is refusing a bus pass and demanding a taxi voucher. CSW explained to patient that since she is ambulatory she can be given a bus pass. Patient became increasingly angry. Security was notified and patient continued to demand a taxi voucher. Patient refused to accept the bus pass and left the unit. Security notified again that patient has left the unit. ? ?Final next level of care: Home/Self Care ?Barriers to Discharge: Barriers Resolved ? ?Patient Goals and CMS Choice ?Choice offered to / list presented to : NA ? ?Discharge Plan and Services         ?DME Arranged: N/A ?DME Agency: NA ? ?Readmission Risk Interventions ?   ? View : No data to display.  ?  ?  ?  ? ?

## 2022-03-24 NOTE — Progress Notes (Signed)
Writer received verbal permission to share her Health Information with her mother Taffie at 15:05 "Yeah I don't care", who called after shift change for an update.   ?"I have a fever!"  Tylenol provided 20:05  Temp 99.1.  Patient fell asleep twice during assessment.  Cooperative.  No eye contact.  Vancomycin not in med rooms and message sent to pharmacy.  Apple juice provided as requested. ?21:00 received Vancomycin.  Patient IV pump alarming Patient side occlusion.  Restarted pump.  Patient C/O IV site leaking.  Adjusted dressing and flushed IV.  Still leaking.  IV KVO converted to SL.  Informed Patient that IV team will have to restart IV.  Requested to change dressing on foot.  Patient became very upset that she could not have her IV changed at that moment. Patient yelling "I don't like your attitude. I'm filing a complaint. I want the Charge Nurse... Supervisor... Director." Patient waved her arms as Probation officer left the room for safety.  Patient used call button to voice same demand as Writer sought out CN.  CN informed of situation and was able to remove old IV and restart IV in L hand and start Vancomycin and obtain an order for Ativan X1.   ?23:15 Taffie had called back for an update.  Deferred questions to Patients Provider and informed her of visitation hours.  Informed of the above interaction. "Oh she gets that way"  Taffie indicated a pattern with staff interactions.  And agreed that it was OK if Unit Director called her if needed.  Goal of conversation was for Patient to receive the best care and have the best outcome.  ?23:30 Patient Accepted Writer again as her nurse.  Ativan given. ?

## 2022-03-24 NOTE — Progress Notes (Signed)
The patient is alert and oriented and has been seen by her physician. The orders for discharge were written. [facility] MD was wanting for patient to get an iron infusion after the IV Vancomycin was finished. Patient's anxiety went up super high after the IV iron infusion was mentioned. All of a sudden patient was ready to leave. Patient wanted her IV out before the IV Vancomycin was finished. Patient's IV Vancomycin bag was halfway empty when she requested that IV be taken out. Medications from [facility] have been delivered to the patient. Patient was given those medications to take home along with her discharge paperwork. Patient received discharge paperwork, but refused for me to review it with her. Patient became very angry about the thought of getting on a bus to go back to her hotel. She said, "I have MRSA in my foot. I don't want to be putting my foot on a bus with MRSA." Patient received education on keeping the wound clean and covered and still refused to have a bus pass. Patient escorted off the unit via security. Memory Argue, Social Worker aware of patient's refusal of bus pass. Memory Argue, Social Worker and [facility] MD aware of patient's angry behavior.  ?

## 2022-03-24 NOTE — Progress Notes (Signed)
Pharmacy Antibiotic Note ? ?Leah Barry is a 36 y.o. female admitted on 03/22/2022 with right foot  wound infection .  Pt was recently admitted and receiving IV antibiotics for this infection, but left AMA. Previous cultures grew MRSA, pharmacy consulted to dose vancomycin. ? ?Today, 03/24/22 ?WBC WNL ?SCr WNL and stable ? ?Day #3 of IV antibiotics ? ?Plan: ?Change vancomycin dose to 1250 mg IV q12h for estimated AUC of 424 ?Goal vancomycin AUC 400-550. Check levels at steady state as needed ?Monitor renal function ? ? ?Height: 5\' 5"  (165.1 cm) ?Weight: 81.6 kg (180 lb) ?IBW/kg (Calculated) : 57 ? ?Temp (24hrs), Avg:98.7 ?F (37.1 ?C), Min:98.1 ?F (36.7 ?C), Max:99.5 ?F (37.5 ?C) ? ?Recent Labs  ?Lab 03/22/22 ?0230 03/23/22 ?0336  ?WBC 8.1 7.6  ?CREATININE 0.53 0.53  ?LATICACIDVEN 0.6  --   ? ?  ?Estimated Creatinine Clearance: 101.5 mL/min (by C-G formula based on SCr of 0.53 mg/dL).   ? ?Allergies  ?Allergen Reactions  ? Latuda [Lurasidone Hcl] Palpitations and Other (See Comments)  ?  Hot flashes, tremors  ? Sulfa Antibiotics Swelling  ?  SWELLING OF EYES  ? Aleve [Naproxen] Other (See Comments)  ?  Nose bleeds  ? Bee Venom Swelling  ?  "Bees make my eyes swell."  ? Flagyl [Metronidazole] Nausea And Vomiting  ?  tablets causes reaction ?Topical is ok  ? Ultram [Tramadol] Itching and Nausea And Vomiting  ? ? ?05/23/22, PharmD ?03/24/22 ?12:17 PM ? ?

## 2022-03-24 NOTE — Discharge Instructions (Signed)
Please keep wound clean and covered at all times. Once the skin begins to grow over the wound, please, STILL keep it covered and protected for at least 2 more weeks.  ?Do not lose your medications.  ?Take antibiotics every 12 hours on schedule until finished.  ? ?You were cared for by a hospitalist during your hospital stay. If you have any questions about your discharge medications or the care you received while you were in the hospital after you are discharged, you can call the unit and asked to speak with the hospitalist on call if the hospitalist that took care of you is not available. Once you are discharged, your primary care physician will handle any further medical issues.  ? ?Please note that NO REFILLS for any discharge medications will be authorized once you are discharged, as it is imperative that you return to your primary care physician (or establish a relationship with a primary care physician if you do not have one) for your aftercare needs so that they can reassess your need for medications and monitor your lab values. ? ?Please take all your medications with you for your next visit with your Primary MD. ?Please ask your Primary MD to get all Hospital records sent to his/her office. ?Please request your Primary MD to go over all hospital test results at the follow up.  ? ?If you experience worsening of your admission symptoms, develop shortness of breath, chest pain, suicidal or homicidal thoughts or a life threatening emergency, you must seek medical attention immediately by calling 911 or calling your MD. ?  ?You must read the complete instructions/literature along with all the possible adverse reactions/side effects for all the medicines you take including new medications that have been prescribed to you. Take new medicines after you have completely understood and accpet all the possible adverse reactions/side effects.  ?  ?Do not drive when taking pain medications or sedatives.  ?   ?Do not take  more than prescribed Pain, Sleep and Anxiety Medications ?  ?If you have smoked or chewed Tobacco in the last 2 yrs please stop. Stop any regular alcohol  and or recreational drug use. ?  ?Wear Seat belts while driving. ? ?

## 2022-03-27 LAB — CULTURE, BLOOD (ROUTINE X 2): Culture: NO GROWTH

## 2022-04-19 ENCOUNTER — Inpatient Hospital Stay: Payer: Medicaid Other | Admitting: Physician Assistant

## 2022-04-19 NOTE — Progress Notes (Deleted)
Patient ID: Leah Barry, female   DOB: [birth year], 36 y.o.   MRN: [id number] After hospitalization 5/10-5/10/2022  From discharge summary: Discharge Diagnoses:   Principal Problem:   MRSA cellulitis of right foot Active Problems:   Iron deficiency anemia   Acute encephalopathy   History of methicillin resistant staphylococcus aureus (MRSA)   Cocaine abuse (HCC)   Microcytic anemia  Hospital Course: This is a 36 year old female with anxiety, depression, sleep apnea, PID who presents to the hospital for a right foot wound that has been present for a few weeks.  She was previously admitted to the hospital a couple of weeks ago, underwent I & D for an abscess, received antibiotics however left AMA prior to completion of antibiotics.  Culture at that time grew a moderate amount of MRSA. In the ED: Noted to have a normal WBC count of 8.1 Admitted for right foot wound with surrounding cellulitis and started on vancomycin   Principal Problem:   Wound with MRSA cellulitis of right foot -Vancomycin resumed- discharge from wound is slowing now- no signs of recurrent abscess, cellulitis improved  - transition to Zyvox today and dc from hospital -X-rays are negative for osteomyelitis - extensive education given regarding keeping the wound clean, taking full course of antibiotics and taking them on time, not losing her medications. She is advised to keep the wound covered for 2 wks even after it begins to epithelialize.  - TOC will attempt to make her an appt with [facility] but I suspect she may be non-compliant with going     Active Problems:   Iron deficiency anemia with microcytosis - Hgb 10-11 range - MCV 70 - Iron saturation 8 and Ferritin 10 - Given an Iron infusion yesterday -Have ordered stool Hemoccult but it has not been completed     Acute encephalopathy -Has resolved-etiology undetermined- ? Due to drug use   Low normal vitamin B12 -Vitamin B12  level is 282 -I will order a 7000 mcg   ? 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 - no tenderness noted on abdominal exam - last tested on 4/21 and was negative for GC/ Chlamydia- have resent the test and it is negative again - wet prep ordered and is negative as well - HIV neg   Cocain abuse - have advised her strongly to avoid all illegal drugs   Anxiety - during the course of our conversations, she appears to repeatedly state that she is "becoming anxious" and tends to avoid difficult conversations   Homeless -The patient states that she does not want to go to a shelter and despite being given resources for shelter during her last admission, she did not go and has been sleeping on the floor of a motel.

## 2022-10-18 ENCOUNTER — Ambulatory Visit: Payer: Self-pay | Admitting: *Deleted

## 2022-10-18 NOTE — Telephone Encounter (Signed)
Pt calling in on the community line.    Reason for Disposition  Patient sounds very sick or weak to the triager  Answer Assessment - Initial Assessment Questions 1. ONSET: "When did the pain start?"      In May I had sepsis MRSA and was in the hospital for my left foot.    Now my legs are swollen and they are infected and swollen again.     I had MRSA.   I have Covid now too.    2. LOCATION: "Where is the pain located?"      I left the hospital AMA.      Can they call me in an antibiotic?   The dr told me they can't do anything for me as a result.    Can they call me in an antibiotic?   If I'm septic can anyone help me?    I left the hospital on my own.    If I come in I know they will admit me and that's why I don't want to come back to the ED.    Can't they just call me in an antibiotic?   I left the hospital on my own even though they told me not to.   They know what is wrong with me.   I don't want to be admitted again.    I let her know medication would not be called in for her that she would have to come back and be seen in the ED.   She kept insisting they know what is wrong with her she has MRSA and just wants an antibiotic called in.    I again let her know that can't be determined over the phone and that the ED does not call in any medications.   She needs to come in. She claimed to have Covid so didn't want to come in.   I let her know they take precautions for Covid and to come on in.   Let them know she has Covid.    She wasn't sure if it was Covid or not "But I'm sure it's Covid I have".   She thanked me for my help and ended the call at this point. 3. PAIN: "How bad is the pain?"    (Scale 1-10; or mild, moderate, severe)  - MILD (1-3): doesn't interfere with normal activities.   - MODERATE (4-7): interferes with normal activities (e.g., work or school) or awakens from sleep, limping.   - SEVERE (8-10): excruciating pain, unable to do any normal activities, unable to walk.       Having pain and swelling in left foot and both legs are swollen just like they were when she was admitted in May.   4. WORK OR EXERCISE: "Has there been any recent work or exercise that involved this part of the body?"      No    5. CAUSE: "What do you think is causing the foot pain?"     MRSA 6. OTHER SYMPTOMS: "Do you have any other symptoms?" (e.g., leg pain, rash, fever, numbness)     See note above 7. PREGNANCY: "Is there any chance you are pregnant?" "When was your last menstrual period?"     Not asked  Protocols used: Foot Pain-A-AH

## 2022-11-17 ENCOUNTER — Other Ambulatory Visit (HOSPITAL_COMMUNITY): Payer: Self-pay

## 2023-04-17 ENCOUNTER — Emergency Department (HOSPITAL_COMMUNITY)
Admission: EM | Admit: 2023-04-17 | Discharge: 2023-04-17 | Disposition: A | Discharge status: Home / Self Care | Payer: BLUE CROSS/BLUE SHIELD | Attending: Emergency Medicine | Admitting: Emergency Medicine

## 2023-04-17 DIAGNOSIS — F1490 Cocaine use, unspecified, uncomplicated: Secondary | ICD-10-CM | POA: Insufficient documentation

## 2023-04-17 DIAGNOSIS — F1410 Cocaine abuse, uncomplicated: Secondary | ICD-10-CM

## 2023-04-17 DIAGNOSIS — T405X1A Poisoning by cocaine, accidental (unintentional), initial encounter: Secondary | ICD-10-CM | POA: Insufficient documentation

## 2023-04-17 DIAGNOSIS — E876 Hypokalemia: Secondary | ICD-10-CM | POA: Diagnosis not present

## 2023-04-17 LAB — WET PREP, GENITAL
Clue Cells Wet Prep HPF POC: NONE SEEN
Sperm: NONE SEEN
Trich, Wet Prep: NONE SEEN
WBC, Wet Prep HPF POC: <10 (ref <10)
Yeast Wet Prep HPF POC: NONE SEEN

## 2023-04-17 LAB — COMPREHENSIVE METABOLIC PANEL
ALT: 15 U/L (ref 0–44)
AST: 20 U/L (ref 15–41)
Albumin: 3.6 g/dL (ref 3.5–5.0)
Alkaline Phosphatase: 61 U/L (ref 38–126)
Anion gap: 8 (ref 5–15)
BUN: 10 mg/dL (ref 6–20)
CO2: 25 mmol/L (ref 22–32)
Calcium: 8.4 mg/dL — ABNORMAL LOW (ref 8.9–10.3)
Chloride: 106 mmol/L (ref 98–111)
Creatinine, Ser: 0.53 mg/dL (ref 0.44–1.00)
GFR, Estimated: >60 mL/min (ref >60)
Glucose, Bld: 120 mg/dL — ABNORMAL HIGH (ref 70–99)
Potassium: 3.4 mmol/L — ABNORMAL LOW (ref 3.5–5.1)
Sodium: 139 mmol/L (ref 135–145)
Total Bilirubin: 0.3 mg/dL (ref 0.3–1.2)
Total Protein: 7.1 g/dL (ref 6.5–8.1)

## 2023-04-17 LAB — RAPID URINE DRUG SCREEN, HOSP PERFORMED
Amphetamines: NOT DETECTED
Barbiturates: NOT DETECTED
Benzodiazepines: NOT DETECTED
Cocaine: POSITIVE — AB
Opiates: NOT DETECTED
Tetrahydrocannabinol: NOT DETECTED

## 2023-04-17 LAB — URINALYSIS, ROUTINE W REFLEX MICROSCOPIC
Bacteria, UA: NONE SEEN
Bilirubin Urine: NEGATIVE
Glucose, UA: NEGATIVE mg/dL
Hgb urine dipstick: NEGATIVE
Ketones, ur: 5 mg/dL — AB
Leukocytes,Ua: NEGATIVE
Nitrite: NEGATIVE
Protein, ur: 30 mg/dL — AB
Specific Gravity, Urine: 1.029 (ref 1.005–1.030)
pH: 5 (ref 5.0–8.0)

## 2023-04-17 LAB — CBC WITH DIFFERENTIAL/PLATELET
Abs Immature Granulocytes: 0.02 10*3/uL (ref 0.00–0.07)
Basophils Absolute: 0 10*3/uL (ref 0.0–0.1)
Basophils Relative: 1 %
Eosinophils Absolute: 0.1 10*3/uL (ref 0.0–0.5)
Eosinophils Relative: 1 %
HCT: 34.1 % — ABNORMAL LOW (ref 36.0–46.0)
Hemoglobin: 11 g/dL — ABNORMAL LOW (ref 12.0–15.0)
Immature Granulocytes: 0 %
Lymphocytes Relative: 16 %
Lymphs Abs: 1 10*3/uL (ref 0.7–4.0)
MCH: 23.2 pg — ABNORMAL LOW (ref 26.0–34.0)
MCHC: 32.3 g/dL (ref 30.0–36.0)
MCV: 71.9 fL — ABNORMAL LOW (ref 80.0–100.0)
Monocytes Absolute: 0.5 10*3/uL (ref 0.1–1.0)
Monocytes Relative: 7 %
Neutro Abs: 4.9 10*3/uL (ref 1.7–7.7)
Neutrophils Relative %: 75 %
Platelets: 327 10*3/uL (ref 150–400)
RBC: 4.74 MIL/uL (ref 3.87–5.11)
RDW: 15.9 % — ABNORMAL HIGH (ref 11.5–15.5)
WBC: 6.5 10*3/uL (ref 4.0–10.5)
nRBC: 0 % (ref 0.0–0.2)

## 2023-04-17 LAB — ACETAMINOPHEN LEVEL: Acetaminophen (Tylenol), Serum: <10 ug/mL — ABNORMAL LOW (ref 10–30)

## 2023-04-17 LAB — SALICYLATE LEVEL: Salicylate Lvl: <7 mg/dL — ABNORMAL LOW (ref 7.0–30.0)

## 2023-04-17 LAB — CBG MONITORING, ED: Glucose-Capillary: 114 mg/dL — ABNORMAL HIGH (ref 70–99)

## 2023-04-17 LAB — I-STAT BETA HCG BLOOD, ED (MC, WL, AP ONLY): I-stat hCG, quantitative: <5 m[IU]/mL (ref <5)

## 2023-04-17 LAB — ETHANOL: Alcohol, Ethyl (B): <10 mg/dL (ref <10)

## 2023-04-17 MED ORDER — LACTATED RINGERS IV BOLUS
1000.0000 mL | Freq: Once | INTRAVENOUS | Status: AC
  Administered 2023-04-17: 1000 mL via INTRAVENOUS

## 2023-04-17 MED ORDER — POTASSIUM CHLORIDE CRYS ER 20 MEQ PO TBCR
40.0000 meq | EXTENDED_RELEASE_TABLET | Freq: Once | ORAL | Status: AC
  Administered 2023-04-17: 40 meq via ORAL
  Filled 2023-04-17: qty 2

## 2023-04-17 MED ORDER — ONDANSETRON HCL 4 MG/2ML IJ SOLN
4.0000 mg | Freq: Once | INTRAMUSCULAR | Status: AC
  Administered 2023-04-17: 4 mg via INTRAVENOUS
  Filled 2023-04-17: qty 2

## 2023-04-17 NOTE — ED Notes (Signed)
Pt given orange juice and sandwich 

## 2023-04-17 NOTE — ED Notes (Signed)
Pt called out stating she's been here all day and no one has been in there to check on her when the previous tech was in there no less than 15 minutes ago. Pt demanding orange juice and sandwich and threatening to leave if not given the items. Told pt I would check with the RN.

## 2023-04-17 NOTE — ED Notes (Signed)
Provided pt with paper scrubs to wear home, as well as a sandwich, graham crackers and orange juice to take with her at her request.

## 2023-04-17 NOTE — ED Provider Notes (Signed)
[name] EMERGENCY DEPARTMENT AT [facility] Provider Note   CSN: [id number] Arrival date & time: 04/17/23  1714     History  Chief Complaint  Patient presents with   Overdose    Leah Barry is a 37 y.o. female with past medical history of bipolar disorder, IBS, substance use disorder who presents to the ED after being found unconscious.  Patient states that she accidentally overdosed on cocaine that she believes was laced with fentanyl.  States that she has used before and does not desire to quit.  She complains of feeling generally weak but no pain or other symptoms.  She was given Narcan prior to arrival.  Denies associated alcohol use.  Denies attempt to harm herself. Denies other symptoms.       Home Medications Prior to Admission medications   Medication Sig Start Date End Date Taking? Authorizing Provider  naloxone [facility]) nasal spray 4 mg/0.1 mL [place] for opiod overdose 03/24/22   Calvert Cantor, MD  oxyCODONE (OXY IR/ROXICODONE) 5 MG immediate release tablet Take 1 tablet by mouth every 4 (four) hours as needed for moderate pain. 03/24/22   Calvert Cantor, MD      Allergies    Kasandra Knudsen [lurasidone hcl], Sulfa antibiotics, Aleve [naproxen], Bee venom, Flagyl [metronidazole], and Ultram [tramadol]    Review of Systems   Review of Systems  All other systems reviewed and are negative.   Physical Exam Updated Vital Signs BP 122/73 (BP Location: Left Arm)   Pulse 60   Temp 97.9 F (36.6 C) (Oral)   Resp 13   SpO2 98%  Physical Exam Vitals and nursing note reviewed.  Constitutional:      General: She is not in acute distress.    Appearance: Normal appearance. She is not ill-appearing, toxic-appearing or diaphoretic.  HENT:     Head: Normocephalic and atraumatic.     Mouth/Throat:     Mouth: Mucous membranes are dry.  Eyes:     General: No scleral icterus.    Conjunctiva/sclera: Conjunctivae normal.  Cardiovascular:     Rate and Rhythm: Normal  rate and regular rhythm.     Heart sounds: No murmur heard. Pulmonary:     Effort: Pulmonary effort is normal. No respiratory distress.     Breath sounds: Normal breath sounds. No stridor. No wheezing, rhonchi or rales.  Abdominal:     General: Abdomen is flat. There is no distension.     Palpations: Abdomen is soft.     Tenderness: There is no abdominal tenderness. There is no guarding or rebound.  Musculoskeletal:        General: No deformity. Normal range of motion.     Cervical back: Normal range of motion and neck supple. No rigidity.     Right lower leg: No edema.     Left lower leg: No edema.  Skin:    General: Skin is warm and dry.     Capillary Refill: Capillary refill takes less than 2 seconds.  Neurological:     General: No focal deficit present.     Mental Status: She is alert and oriented to person, place, and time.     Comments: Sleepy on initial exam but easily arousable  Psychiatric:        Mood and Affect: Affect is blunt.        Speech: Speech normal.        Behavior: Behavior is cooperative.        Thought Content: Thought  content normal.        Judgment: Judgment normal.     ED Results / Procedures / Treatments   Labs (all labs ordered are listed, but only abnormal results are displayed) Labs Reviewed  COMPREHENSIVE METABOLIC PANEL - Abnormal; Notable for the following components:      Result Value   Potassium 3.4 (*)    Glucose, Bld 120 (*)    Calcium 8.4 (*)    All other components within normal limits  SALICYLATE LEVEL - Abnormal; Notable for the following components:   Salicylate Lvl <7.0 (*)    All other components within normal limits  ACETAMINOPHEN LEVEL - Abnormal; Notable for the following components:   Acetaminophen (Tylenol), Serum <10 (*)    All other components within normal limits  RAPID URINE DRUG SCREEN, HOSP PERFORMED - Abnormal; Notable for the following components:   Cocaine POSITIVE (*)    All other components within normal  limits  CBC WITH DIFFERENTIAL/PLATELET - Abnormal; Notable for the following components:   Hemoglobin 11.0 (*)    HCT 34.1 (*)    MCV 71.9 (*)    MCH 23.2 (*)    RDW 15.9 (*)    All other components within normal limits  CBG MONITORING, ED - Abnormal; Notable for the following components:   Glucose-Capillary 114 (*)    All other components within normal limits  WET PREP, GENITAL  ETHANOL  RPR  URINALYSIS, ROUTINE W REFLEX MICROSCOPIC  HIV ANTIBODY (ROUTINE TESTING W REFLEX)  I-STAT BETA HCG BLOOD, ED ([facility], [facility], [facility] ONLY)  GC/CHLAMYDIA PROBE AMP ([place]) NOT AT [facility]    EKG NSR at 67 bpm, no acute ST-T changes, no STEMI  Radiology No results found.  Procedures Procedures    Medications Ordered in ED Medications  lactated ringers bolus 1,000 mL (0 mLs Intravenous Stopped 04/17/23 2044)  ondansetron (ZOFRAN) injection 4 mg (4 mg Intravenous Given 04/17/23 1902)  potassium chloride SA (KLOR-CON M) CR tablet 40 mEq (40 mEq Oral Given 04/17/23 2117)  lactated ringers bolus 1,000 mL (1,000 mLs Intravenous New Bag/Given 04/17/23 2117)    ED Course/ Medical Decision Making/ A&P                             Medical Decision Making Amount and/or Complexity of Data Reviewed Labs: ordered. Decision-making details documented in ED Course. ECG/medicine tests: ordered. Decision-making details documented in ED Course.   Medical Decision Making:   Leah Barry is a 37 y.o. female who presented to the ED today with AMS detailed above.    Patient's presentation is complicated by their history of substance use, bipolar.  Patient placed on continuous vitals and telemetry monitoring while in ED which was reviewed periodically.  Complete initial physical exam performed, notably the patient was in NAD. Sleepy but easily arousable. Nonfocal neuro exam. Abdomen soft and nontender. LCTA. No signs of distress.    Reviewed and confirmed nursing documentation for past medical history, family  history, social history.    Initial Assessment:   With the patient's presentation, the differential diagnosis for AMS is extensive and includes, but is not limited to: drug overdose - opioids, alcohol, sedatives, antipsychotics, drug withdrawal, others; Metabolic: hypoxia, hypoglycemia, hyperglycemia, hypercalcemia, hypernatremia, hyponatremia, uremia, hepatic encephalopathy, hypothyroidism, hyperthyroidism, vitamin B12 or thiamine deficiency, carbon monoxide poisoning, Wilson's disease, Lactic acidosis, DKA/HHOS; Infectious: meningitis, encephalitis, bacteremia/sepsis, urinary tract infection, pneumonia, neurosyphilis; Structural: Space-occupying lesion, (brain tumor, subdural hematoma, hydrocephalus,);  Vascular: stroke, subarachnoid hemorrhage, coronary ischemia, hypertensive encephalopathy, CNS vasculitis, thrombotic thrombocytopenic purpura, disseminated intravascular coagulation, hyperviscosity; Psychiatric: Schizophrenia, depression; Other: Seizure, hypothermia, heat stroke, dementia  This is most consistent with an acute complicated illness  Initial Plan:  Screening labs including CBC and Metabolic panel to evaluate for infectious or metabolic etiology of disease.  Salicylate, acetaminophen, and ethanol levels to assess for overdose UDS to assess for overdose EKG to evaluate for cardiac pathology Patient requesting STD screening.  She is aware that these findings will not all results today.  She will follow-up via MyChart. Fluids to help dehydration/metabolization Objective evaluation as below reviewed   Initial Study Results:   Laboratory  All laboratory results reviewed without evidence of clinically relevant pathology.   Exceptions include: K 3.4, calcium 8.4, Hgb 11  Final Assessment and Plan:   This is a 37 year old female who presents to the ED for evaluation post overdose.  Patient states that she took cocaine that she believes was laced with fentanyl when she was found to be  unconscious.  Improvement in symptoms following Narcan. Pt c/o feeling "weak" on initial exam. Neurologically intact. Abdomen soft and nontender. No chest pain, shortness of breath. Vital signs reassuring. Workup initiated as above for further evaluation. Fluids given to help with metabolization. UDS positive for cocaine only, no other substances appear to be on board. Pt with rapid improvement in symptoms following boluses.  She has a minimal hypokalemia which was repleted.  Hemoglobin at baseline.  No active complaints on multiple reexaminations.  Patient desires to go home.  She is neurologically intact, alert and oriented, and appears to have decision-making capacity as I believe this is reasonable.  Provided with multiple resources regarding substance use disorder. Tolerating PO without difficulty. Strict ED return precautions given, all questions answered, and stable for discharge.    Clinical Impression:  1. Overdose of cocaine, accidental or unintentional, initial encounter (HCC)   2. Cocaine use disorder (HCC)   3. Hypokalemia      Discharge           Final Clinical Impression(s) / ED Diagnoses Final diagnoses:  Overdose of cocaine, accidental or unintentional, initial encounter (HCC)  Cocaine use disorder (HCC)  Hypokalemia    Rx / DC Orders ED Discharge Orders     None         Richardson Dopp 04/17/23 2307    Arby Barrette, MD 04/18/23 1507

## 2023-04-17 NOTE — ED Triage Notes (Signed)
Pt BIBA from hotel with c/o unconsciousness. Pt given rescue breathes.Given Intranasal Narcan. Pt responded well to Narcan. Pinpoint pupils.   CAP 41 RR 21 100% 2L [place] HR 67 BP 125/88  18G RAC

## 2023-04-17 NOTE — Discharge Instructions (Addendum)
Thank you for letting us take care of you today.  We treated you with fluids to help with your accidental overdose. I recommend discontinuing using any illicit substances or alcohol. I provided primary care for you to follow up with as well as several resources for assistance with rehab, etc. Please utilize these to your advantage. Your potassium was slightly low. We gave you a medication to replace this.   Please establish primary care for management of continued symptoms or concerns. If you develop any new or worsening symptoms such as chest pain, shortness of breath, thoughts of harming yourself or others, seeing or hearing things that are not there, loss of consciousness, or other new, concerning symptoms, please return to the nearest ED for reevaluation.

## 2023-04-18 LAB — RPR: RPR Ser Ql: NONREACTIVE

## 2023-04-18 LAB — GC/CHLAMYDIA PROBE AMP (~~LOC~~) NOT AT ARMC
Chlamydia: POSITIVE — AB
Comment: NEGATIVE
Comment: NORMAL
Neisseria Gonorrhea: POSITIVE — AB

## 2023-04-19 LAB — MISC LABCORP TEST (SEND OUT): Labcorp test code: 83935
